# Patient Record
Sex: Male | Born: 1935 | Race: White | Hispanic: No | Smoking: Former smoker
Health system: Southern US, Community
[De-identification: ages and names within clinical notes are randomized; demographics above are authoritative.]

## PROBLEM LIST (undated history)

## (undated) DIAGNOSIS — E871 Hypo-osmolality and hyponatremia: Secondary | ICD-10-CM

## (undated) DIAGNOSIS — M48061 Spinal stenosis, lumbar region without neurogenic claudication: Secondary | ICD-10-CM

## (undated) DIAGNOSIS — I350 Nonrheumatic aortic (valve) stenosis: Secondary | ICD-10-CM

## (undated) DIAGNOSIS — I10 Essential (primary) hypertension: Secondary | ICD-10-CM

## (undated) DIAGNOSIS — I2510 Atherosclerotic heart disease of native coronary artery without angina pectoris: Secondary | ICD-10-CM

## (undated) DIAGNOSIS — M5136 Other intervertebral disc degeneration, lumbar region: Secondary | ICD-10-CM

## (undated) DIAGNOSIS — F0390 Unspecified dementia without behavioral disturbance: Secondary | ICD-10-CM

## (undated) DIAGNOSIS — C3490 Malignant neoplasm of unspecified part of unspecified bronchus or lung: Secondary | ICD-10-CM

## (undated) DIAGNOSIS — I2699 Other pulmonary embolism without acute cor pulmonale: Secondary | ICD-10-CM

## (undated) DIAGNOSIS — E7800 Pure hypercholesterolemia, unspecified: Secondary | ICD-10-CM

## (undated) DIAGNOSIS — G510 Bell's palsy: Secondary | ICD-10-CM

## (undated) DIAGNOSIS — N400 Enlarged prostate without lower urinary tract symptoms: Secondary | ICD-10-CM

## (undated) DIAGNOSIS — M51369 Other intervertebral disc degeneration, lumbar region without mention of lumbar back pain or lower extremity pain: Secondary | ICD-10-CM

## (undated) DIAGNOSIS — C801 Malignant (primary) neoplasm, unspecified: Secondary | ICD-10-CM

## (undated) DIAGNOSIS — K5790 Diverticulosis of intestine, part unspecified, without perforation or abscess without bleeding: Secondary | ICD-10-CM

## (undated) DIAGNOSIS — I639 Cerebral infarction, unspecified: Secondary | ICD-10-CM

## (undated) DIAGNOSIS — J449 Chronic obstructive pulmonary disease, unspecified: Secondary | ICD-10-CM

## (undated) DIAGNOSIS — K219 Gastro-esophageal reflux disease without esophagitis: Secondary | ICD-10-CM

## (undated) DIAGNOSIS — Z86718 Personal history of other venous thrombosis and embolism: Secondary | ICD-10-CM

## (undated) HISTORY — PX: LUNG REMOVAL, PARTIAL: SHX233

## (undated) HISTORY — DX: Pure hypercholesterolemia, unspecified: E78.00

## (undated) HISTORY — DX: Gastro-esophageal reflux disease without esophagitis: K21.9

## (undated) HISTORY — DX: Essential (primary) hypertension: I10

## (undated) HISTORY — DX: Other intervertebral disc degeneration, lumbar region: M51.36

## (undated) HISTORY — DX: Other pulmonary embolism without acute cor pulmonale: I26.99

## (undated) HISTORY — PX: FILTERING PROCEDURE: SHX5296

## (undated) HISTORY — DX: Chronic obstructive pulmonary disease, unspecified: J44.9

## (undated) HISTORY — DX: Hypo-osmolality and hyponatremia: E87.1

## (undated) HISTORY — DX: Diverticulosis of intestine, part unspecified, without perforation or abscess without bleeding: K57.90

## (undated) HISTORY — PX: KNEE SURGERY: SHX244

## (undated) HISTORY — DX: Atherosclerotic heart disease of native coronary artery without angina pectoris: I25.10

## (undated) HISTORY — PX: HERNIA REPAIR: SHX51

## (undated) HISTORY — DX: Spinal stenosis, lumbar region without neurogenic claudication: M48.061

## (undated) HISTORY — DX: Other intervertebral disc degeneration, lumbar region without mention of lumbar back pain or lower extremity pain: M51.369

## (undated) HISTORY — PX: CORONARY STENT PLACEMENT: SHX1402

---
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
## 1977-12-28 HISTORY — PX: LUMBAR LAMINECTOMY: SHX95

## 1989-12-28 HISTORY — PX: TURP VAPORIZATION: SUR1397

## 1999-11-04 ENCOUNTER — Encounter: Admission: RE | Admit: 1999-11-04 | Discharge: 2000-02-02 | Payer: Self-pay | Admitting: *Deleted

## 2004-03-13 ENCOUNTER — Ambulatory Visit (HOSPITAL_COMMUNITY): Admission: RE | Admit: 2004-03-13 | Discharge: 2004-03-13 | Payer: Self-pay | Admitting: Gastroenterology

## 2004-06-27 DIAGNOSIS — I2699 Other pulmonary embolism without acute cor pulmonale: Secondary | ICD-10-CM

## 2004-06-27 HISTORY — DX: Other pulmonary embolism without acute cor pulmonale: I26.99

## 2004-06-27 HISTORY — PX: VENA CAVA FILTER PLACEMENT: SUR1032

## 2004-07-04 ENCOUNTER — Inpatient Hospital Stay (HOSPITAL_COMMUNITY): Admission: RE | Admit: 2004-07-04 | Discharge: 2004-07-08 | Payer: Self-pay | Admitting: Specialist

## 2004-07-17 ENCOUNTER — Inpatient Hospital Stay (HOSPITAL_COMMUNITY): Admission: EM | Admit: 2004-07-17 | Discharge: 2004-07-23 | Payer: Self-pay | Admitting: Emergency Medicine

## 2004-08-28 DIAGNOSIS — C3490 Malignant neoplasm of unspecified part of unspecified bronchus or lung: Secondary | ICD-10-CM

## 2004-08-28 HISTORY — DX: Malignant neoplasm of unspecified part of unspecified bronchus or lung: C34.90

## 2004-09-05 ENCOUNTER — Encounter: Admission: RE | Admit: 2004-09-05 | Discharge: 2004-09-05 | Payer: Self-pay | Admitting: Geriatric Medicine

## 2004-09-11 ENCOUNTER — Ambulatory Visit (HOSPITAL_COMMUNITY): Admission: RE | Admit: 2004-09-11 | Discharge: 2004-09-11 | Payer: Self-pay | Admitting: Geriatric Medicine

## 2004-09-27 HISTORY — PX: WEDGE RESECTION: SHX5070

## 2004-10-22 ENCOUNTER — Ambulatory Visit: Payer: Self-pay | Admitting: Internal Medicine

## 2004-10-22 ENCOUNTER — Encounter (INDEPENDENT_AMBULATORY_CARE_PROVIDER_SITE_OTHER): Payer: Self-pay | Admitting: *Deleted

## 2004-10-22 ENCOUNTER — Inpatient Hospital Stay (HOSPITAL_COMMUNITY): Admission: RE | Admit: 2004-10-22 | Discharge: 2004-10-27 | Payer: Self-pay | Admitting: Thoracic Surgery

## 2004-11-05 ENCOUNTER — Encounter: Admission: RE | Admit: 2004-11-05 | Discharge: 2004-11-05 | Payer: Self-pay | Admitting: Thoracic Surgery

## 2004-11-19 ENCOUNTER — Encounter: Admission: RE | Admit: 2004-11-19 | Discharge: 2004-11-19 | Payer: Self-pay | Admitting: Thoracic Surgery

## 2004-12-23 ENCOUNTER — Ambulatory Visit: Payer: Self-pay | Admitting: Internal Medicine

## 2004-12-31 ENCOUNTER — Encounter: Admission: RE | Admit: 2004-12-31 | Discharge: 2004-12-31 | Payer: Self-pay | Admitting: Thoracic Surgery

## 2005-03-13 ENCOUNTER — Encounter: Admission: RE | Admit: 2005-03-13 | Discharge: 2005-03-13 | Payer: Self-pay | Admitting: Geriatric Medicine

## 2005-03-31 ENCOUNTER — Encounter: Admission: RE | Admit: 2005-03-31 | Discharge: 2005-03-31 | Payer: Self-pay | Admitting: Orthopedic Surgery

## 2005-04-01 ENCOUNTER — Encounter: Admission: RE | Admit: 2005-04-01 | Discharge: 2005-04-01 | Payer: Self-pay | Admitting: Thoracic Surgery

## 2005-04-14 ENCOUNTER — Encounter: Admission: RE | Admit: 2005-04-14 | Discharge: 2005-04-14 | Payer: Self-pay | Admitting: Geriatric Medicine

## 2005-04-24 ENCOUNTER — Ambulatory Visit: Payer: Self-pay | Admitting: Internal Medicine

## 2005-04-30 ENCOUNTER — Ambulatory Visit (HOSPITAL_COMMUNITY): Admission: RE | Admit: 2005-04-30 | Discharge: 2005-04-30 | Payer: Self-pay | Admitting: Internal Medicine

## 2005-08-05 ENCOUNTER — Encounter: Admission: RE | Admit: 2005-08-05 | Discharge: 2005-08-05 | Payer: Self-pay | Admitting: Thoracic Surgery

## 2005-09-25 ENCOUNTER — Encounter: Admission: RE | Admit: 2005-09-25 | Discharge: 2005-09-25 | Payer: Self-pay | Admitting: Geriatric Medicine

## 2005-10-16 ENCOUNTER — Ambulatory Visit: Payer: Self-pay | Admitting: Internal Medicine

## 2005-11-02 ENCOUNTER — Ambulatory Visit (HOSPITAL_COMMUNITY): Admission: RE | Admit: 2005-11-02 | Discharge: 2005-11-02 | Payer: Self-pay | Admitting: Internal Medicine

## 2006-05-10 ENCOUNTER — Ambulatory Visit: Payer: Self-pay | Admitting: Internal Medicine

## 2006-05-19 ENCOUNTER — Ambulatory Visit (HOSPITAL_COMMUNITY): Admission: RE | Admit: 2006-05-19 | Discharge: 2006-05-19 | Payer: Self-pay | Admitting: Internal Medicine

## 2006-11-08 ENCOUNTER — Ambulatory Visit: Payer: Self-pay | Admitting: Internal Medicine

## 2006-11-17 ENCOUNTER — Ambulatory Visit (HOSPITAL_COMMUNITY): Admission: RE | Admit: 2006-11-17 | Discharge: 2006-11-17 | Payer: Self-pay | Admitting: Internal Medicine

## 2006-11-17 LAB — COMPREHENSIVE METABOLIC PANEL
ALT: 18 U/L (ref 0–53)
AST: 23 U/L (ref 0–37)
CO2: 30 mEq/L (ref 19–32)
Calcium: 9.3 mg/dL (ref 8.4–10.5)
Chloride: 102 mEq/L (ref 96–112)
Creatinine, Ser: 0.81 mg/dL (ref 0.40–1.50)
Potassium: 4.4 mEq/L (ref 3.5–5.3)
Sodium: 140 mEq/L (ref 135–145)
Total Protein: 6.5 g/dL (ref 6.0–8.3)

## 2006-11-17 LAB — CBC WITH DIFFERENTIAL/PLATELET
BASO%: 0.8 % (ref 0.0–2.0)
EOS%: 1.5 % (ref 0.0–7.0)
HCT: 41.2 % (ref 38.7–49.9)
MCH: 31.5 pg (ref 28.0–33.4)
MCHC: 34.2 g/dL (ref 32.0–35.9)
MONO#: 0.5 10*3/uL (ref 0.1–0.9)
NEUT%: 62.7 % (ref 40.0–75.0)
RBC: 4.48 10*6/uL (ref 4.20–5.71)
RDW: 12.6 % (ref 11.2–14.6)
WBC: 6.3 10*3/uL (ref 4.0–10.0)
lymph#: 1.7 10*3/uL (ref 0.9–3.3)

## 2007-02-09 ENCOUNTER — Encounter: Admission: RE | Admit: 2007-02-09 | Discharge: 2007-02-09 | Payer: Self-pay | Admitting: Geriatric Medicine

## 2007-04-22 ENCOUNTER — Ambulatory Visit (HOSPITAL_COMMUNITY): Admission: RE | Admit: 2007-04-22 | Discharge: 2007-04-22 | Payer: Self-pay | Admitting: Geriatric Medicine

## 2007-05-16 ENCOUNTER — Ambulatory Visit: Payer: Self-pay | Admitting: Internal Medicine

## 2007-05-18 LAB — CBC WITH DIFFERENTIAL/PLATELET
BASO%: 0.2 % (ref 0.0–2.0)
EOS%: 1.3 % (ref 0.0–7.0)
HCT: 41.9 % (ref 38.7–49.9)
LYMPH%: 19 % (ref 14.0–48.0)
MCH: 31.7 pg (ref 28.0–33.4)
MCHC: 34.8 g/dL (ref 32.0–35.9)
NEUT%: 71.9 % (ref 40.0–75.0)
RBC: 4.61 10*6/uL (ref 4.20–5.71)
WBC: 7.1 10*3/uL (ref 4.0–10.0)
lymph#: 1.3 10*3/uL (ref 0.9–3.3)

## 2007-05-18 LAB — COMPREHENSIVE METABOLIC PANEL
ALT: 18 U/L (ref 0–53)
AST: 20 U/L (ref 0–37)
Chloride: 104 mEq/L (ref 96–112)
Creatinine, Ser: 0.84 mg/dL (ref 0.40–1.50)
Sodium: 140 mEq/L (ref 135–145)
Total Bilirubin: 0.7 mg/dL (ref 0.3–1.2)
Total Protein: 6.7 g/dL (ref 6.0–8.3)

## 2007-05-20 ENCOUNTER — Ambulatory Visit (HOSPITAL_COMMUNITY): Admission: RE | Admit: 2007-05-20 | Discharge: 2007-05-20 | Payer: Self-pay | Admitting: Internal Medicine

## 2007-11-07 ENCOUNTER — Ambulatory Visit: Payer: Self-pay | Admitting: Internal Medicine

## 2007-11-09 LAB — COMPREHENSIVE METABOLIC PANEL
Albumin: 4.4 g/dL (ref 3.5–5.2)
Alkaline Phosphatase: 133 U/L — ABNORMAL HIGH (ref 39–117)
BUN: 15 mg/dL (ref 6–23)
CO2: 26 mEq/L (ref 19–32)
Glucose, Bld: 184 mg/dL — ABNORMAL HIGH (ref 70–99)
Potassium: 5.1 mEq/L (ref 3.5–5.3)
Sodium: 139 mEq/L (ref 135–145)
Total Bilirubin: 0.9 mg/dL (ref 0.3–1.2)
Total Protein: 7.1 g/dL (ref 6.0–8.3)

## 2007-11-09 LAB — CBC WITH DIFFERENTIAL/PLATELET
Basophils Absolute: 0.1 10*3/uL (ref 0.0–0.1)
Eosinophils Absolute: 0.1 10*3/uL (ref 0.0–0.5)
HGB: 15.4 g/dL (ref 13.0–17.1)
LYMPH%: 25.6 % (ref 14.0–48.0)
MCV: 91.5 fL (ref 81.6–98.0)
MONO#: 0.5 10*3/uL (ref 0.1–0.9)
MONO%: 7.9 % (ref 0.0–13.0)
NEUT#: 4 10*3/uL (ref 1.5–6.5)
Platelets: 279 10*3/uL (ref 145–400)
RBC: 4.78 10*6/uL (ref 4.20–5.71)
WBC: 6.4 10*3/uL (ref 4.0–10.0)

## 2007-11-11 ENCOUNTER — Ambulatory Visit (HOSPITAL_COMMUNITY): Admission: RE | Admit: 2007-11-11 | Discharge: 2007-11-11 | Payer: Self-pay | Admitting: Internal Medicine

## 2008-05-10 ENCOUNTER — Ambulatory Visit (HOSPITAL_COMMUNITY): Admission: RE | Admit: 2008-05-10 | Discharge: 2008-05-10 | Payer: Self-pay | Admitting: Geriatric Medicine

## 2008-12-19 ENCOUNTER — Ambulatory Visit: Payer: Self-pay | Admitting: Internal Medicine

## 2008-12-25 ENCOUNTER — Ambulatory Visit (HOSPITAL_COMMUNITY): Admission: RE | Admit: 2008-12-25 | Discharge: 2008-12-25 | Payer: Self-pay | Admitting: Internal Medicine

## 2008-12-25 LAB — CBC WITH DIFFERENTIAL/PLATELET
BASO%: 0.4 % (ref 0.0–2.0)
HCT: 42.7 % (ref 38.7–49.9)
LYMPH%: 22.7 % (ref 14.0–48.0)
MCH: 31.9 pg (ref 28.0–33.4)
MCHC: 34.7 g/dL (ref 32.0–35.9)
MONO%: 7.4 % (ref 0.0–13.0)
NEUT#: 4.2 10*3/uL (ref 1.5–6.5)
RBC: 4.64 10*6/uL (ref 4.20–5.71)
WBC: 6.2 10*3/uL (ref 4.0–10.0)
lymph#: 1.4 10*3/uL (ref 0.9–3.3)

## 2008-12-25 LAB — COMPREHENSIVE METABOLIC PANEL
ALT: 21 U/L (ref 0–53)
AST: 25 U/L (ref 0–37)
Albumin: 3.8 g/dL (ref 3.5–5.2)
CO2: 31 mEq/L (ref 19–32)
Calcium: 9 mg/dL (ref 8.4–10.5)
Chloride: 99 mEq/L (ref 96–112)
Creatinine, Ser: 0.8 mg/dL (ref 0.40–1.50)
Potassium: 4.1 mEq/L (ref 3.5–5.3)

## 2009-02-04 ENCOUNTER — Ambulatory Visit: Payer: Self-pay | Admitting: Pulmonary Disease

## 2009-02-04 DIAGNOSIS — R0602 Shortness of breath: Secondary | ICD-10-CM | POA: Insufficient documentation

## 2009-02-04 DIAGNOSIS — J449 Chronic obstructive pulmonary disease, unspecified: Secondary | ICD-10-CM | POA: Insufficient documentation

## 2009-02-04 DIAGNOSIS — I2699 Other pulmonary embolism without acute cor pulmonale: Secondary | ICD-10-CM

## 2009-02-04 DIAGNOSIS — E119 Type 2 diabetes mellitus without complications: Secondary | ICD-10-CM | POA: Insufficient documentation

## 2009-02-04 DIAGNOSIS — C3490 Malignant neoplasm of unspecified part of unspecified bronchus or lung: Secondary | ICD-10-CM | POA: Insufficient documentation

## 2009-02-05 DIAGNOSIS — I2510 Atherosclerotic heart disease of native coronary artery without angina pectoris: Secondary | ICD-10-CM | POA: Insufficient documentation

## 2009-02-08 ENCOUNTER — Ambulatory Visit: Payer: Self-pay | Admitting: Pulmonary Disease

## 2009-02-22 ENCOUNTER — Encounter: Payer: Self-pay | Admitting: Pulmonary Disease

## 2009-02-22 ENCOUNTER — Ambulatory Visit: Payer: Self-pay | Admitting: Pulmonary Disease

## 2009-02-23 DIAGNOSIS — J45909 Unspecified asthma, uncomplicated: Secondary | ICD-10-CM | POA: Insufficient documentation

## 2009-12-23 ENCOUNTER — Ambulatory Visit: Payer: Self-pay | Admitting: Internal Medicine

## 2009-12-30 ENCOUNTER — Ambulatory Visit (HOSPITAL_COMMUNITY)
Admission: RE | Admit: 2009-12-30 | Discharge: 2009-12-30 | Payer: Self-pay | Source: Home / Self Care | Admitting: Internal Medicine

## 2009-12-30 LAB — COMPREHENSIVE METABOLIC PANEL
AST: 22 U/L (ref 0–37)
Albumin: 3.8 g/dL (ref 3.5–5.2)
Calcium: 8.9 mg/dL (ref 8.4–10.5)
Potassium: 4.3 mEq/L (ref 3.5–5.3)
Sodium: 138 mEq/L (ref 135–145)
Total Protein: 6.9 g/dL (ref 6.0–8.3)

## 2009-12-30 LAB — CBC WITH DIFFERENTIAL/PLATELET
BASO%: 0.2 % (ref 0.0–2.0)
Basophils Absolute: 0 10*3/uL (ref 0.0–0.1)
EOS%: 2.6 % (ref 0.0–7.0)
HCT: 43 % (ref 38.4–49.9)
LYMPH%: 23.4 % (ref 14.0–49.0)
MCH: 32.8 pg (ref 27.2–33.4)
MCV: 94.9 fL (ref 79.3–98.0)
NEUT#: 4.3 10*3/uL (ref 1.5–6.5)
Platelets: 278 10*3/uL (ref 140–400)
RDW: 12.8 % (ref 11.0–14.6)

## 2010-12-26 ENCOUNTER — Ambulatory Visit: Payer: Self-pay | Admitting: Internal Medicine

## 2010-12-30 ENCOUNTER — Ambulatory Visit (HOSPITAL_COMMUNITY)
Admission: RE | Admit: 2010-12-30 | Discharge: 2010-12-30 | Payer: Self-pay | Source: Home / Self Care | Attending: Internal Medicine | Admitting: Internal Medicine

## 2010-12-30 LAB — COMPREHENSIVE METABOLIC PANEL
ALT: 17 U/L (ref 0–53)
AST: 22 U/L (ref 0–37)
Albumin: 4.1 g/dL (ref 3.5–5.2)
Alkaline Phosphatase: 143 U/L — ABNORMAL HIGH (ref 39–117)
BUN: 13 mg/dL (ref 6–23)
CO2: 28 mEq/L (ref 19–32)
Calcium: 9.4 mg/dL (ref 8.4–10.5)
Chloride: 98 mEq/L (ref 96–112)
Creatinine, Ser: 0.82 mg/dL (ref 0.40–1.50)
Glucose, Bld: 217 mg/dL — ABNORMAL HIGH (ref 70–99)
Potassium: 4.2 mEq/L (ref 3.5–5.3)
Sodium: 137 mEq/L (ref 135–145)
Total Bilirubin: 0.7 mg/dL (ref 0.3–1.2)
Total Protein: 6.9 g/dL (ref 6.0–8.3)

## 2010-12-30 LAB — CBC WITH DIFFERENTIAL/PLATELET
BASO%: 0.4 % (ref 0.0–2.0)
Basophils Absolute: 0 10*3/uL (ref 0.0–0.1)
HCT: 43.7 % (ref 38.4–49.9)
MCV: 93.7 fL (ref 79.3–98.0)
MONO#: 0.6 10*3/uL (ref 0.1–0.9)
MONO%: 8 % (ref 0.0–14.0)
NEUT%: 61.6 % (ref 39.0–75.0)
RBC: 4.67 10*6/uL (ref 4.20–5.82)

## 2011-01-17 ENCOUNTER — Encounter: Payer: Self-pay | Admitting: Thoracic Surgery

## 2011-01-17 ENCOUNTER — Encounter: Payer: Self-pay | Admitting: Internal Medicine

## 2011-01-18 ENCOUNTER — Encounter: Payer: Self-pay | Admitting: Internal Medicine

## 2011-01-18 ENCOUNTER — Encounter: Payer: Self-pay | Admitting: Thoracic Surgery

## 2011-01-18 ENCOUNTER — Encounter: Payer: Self-pay | Admitting: Geriatric Medicine

## 2011-01-19 ENCOUNTER — Encounter: Payer: Self-pay | Admitting: Internal Medicine

## 2011-02-12 ENCOUNTER — Other Ambulatory Visit: Payer: Self-pay | Admitting: Geriatric Medicine

## 2011-02-12 DIAGNOSIS — M545 Low back pain: Secondary | ICD-10-CM

## 2011-02-16 ENCOUNTER — Ambulatory Visit
Admission: RE | Admit: 2011-02-16 | Discharge: 2011-02-16 | Disposition: A | Discharge status: Home / Self Care | Payer: Medicare Other | Source: Ambulatory Visit | Attending: Geriatric Medicine | Admitting: Geriatric Medicine

## 2011-02-16 DIAGNOSIS — M545 Low back pain: Secondary | ICD-10-CM

## 2011-02-16 MED ORDER — GADOBENATE DIMEGLUMINE 529 MG/ML IV SOLN
20.0000 mL | Freq: Once | INTRAVENOUS | Status: AC | PRN
  Administered 2011-02-16: 20 mL via INTRAVENOUS

## 2011-02-26 ENCOUNTER — Ambulatory Visit: Payer: Medicare Other | Attending: Geriatric Medicine

## 2011-02-26 DIAGNOSIS — M545 Low back pain, unspecified: Secondary | ICD-10-CM | POA: Insufficient documentation

## 2011-02-26 DIAGNOSIS — M2560 Stiffness of unspecified joint, not elsewhere classified: Secondary | ICD-10-CM | POA: Insufficient documentation

## 2011-02-26 DIAGNOSIS — R262 Difficulty in walking, not elsewhere classified: Secondary | ICD-10-CM | POA: Insufficient documentation

## 2011-02-26 DIAGNOSIS — IMO0001 Reserved for inherently not codable concepts without codable children: Secondary | ICD-10-CM | POA: Insufficient documentation

## 2011-03-03 ENCOUNTER — Encounter: Payer: Medicare Other | Admitting: Physical Therapy

## 2011-03-05 ENCOUNTER — Encounter: Payer: Medicare Other | Admitting: Rehabilitation

## 2011-03-10 ENCOUNTER — Encounter: Payer: Medicare Other | Admitting: Rehabilitation

## 2011-03-10 ENCOUNTER — Ambulatory Visit: Payer: Medicare Other

## 2011-03-12 ENCOUNTER — Ambulatory Visit: Payer: Medicare Other

## 2011-03-17 ENCOUNTER — Ambulatory Visit: Payer: Medicare Other

## 2011-03-19 ENCOUNTER — Ambulatory Visit: Payer: Medicare Other

## 2011-04-23 ENCOUNTER — Other Ambulatory Visit (HOSPITAL_COMMUNITY): Payer: Self-pay | Admitting: Urology

## 2011-04-23 DIAGNOSIS — N4340 Spermatocele of epididymis, unspecified: Secondary | ICD-10-CM

## 2011-04-24 ENCOUNTER — Ambulatory Visit (HOSPITAL_COMMUNITY)
Admission: RE | Admit: 2011-04-24 | Discharge: 2011-04-24 | Disposition: A | Discharge status: Home / Self Care | Payer: Medicare Other | Source: Ambulatory Visit | Attending: Urology | Admitting: Urology

## 2011-04-24 DIAGNOSIS — N4340 Spermatocele of epididymis, unspecified: Secondary | ICD-10-CM

## 2011-04-24 DIAGNOSIS — N508 Other specified disorders of male genital organs: Secondary | ICD-10-CM | POA: Insufficient documentation

## 2011-05-15 NOTE — Op Note (Signed)
NAME:  Chad, Fuller NO.:  [id number]   MEDICAL RECORD NO.:  [id number]                   PATIENT TYPE:  AMB   LOCATION:  ENDO                                 FACILITY:  [facility]   PHYSICIAN:  Danise Edge, M.D.                DATE OF BIRTH:  1926/01/28   DATE OF PROCEDURE:  03/13/2004  DATE OF DISCHARGE:                                 OPERATIVE REPORT   REFERRING PHYSICIAN:  Dr. Merlene Laughter.   PROCEDURE:  Screening colonoscopy.   PROCEDURE INDICATION:  Mr. [name] is a 77 year old male, born  1926-04-14.  Mr. [name] is scheduled to undergo his first screening  colonoscopy with polypectomy to prevent colon cancer.   ENDOSCOPIST:  Charolett Bumpers, M.D.   PREMEDICATION:  1. Versed 5 mg.  2. Demerol 50 mg.   DESCRIPTION OF PROCEDURE:  After obtaining informed consent, Mr. [name] was  placed in the left lateral decubitus position.  I administered intravenous  Demerol and intravenous Versed to achieve conscious sedation for the  procedure.  The patient's blood pressure, oxygen saturation, and cardiac  rhythm were monitored throughout the procedure and documented in the medical  record.   Anal inspection was normal.  Digital rectal exam revealed a nonnodular  prostate.  The Olympus adjustable pediatric colonoscope was introduced into  the rectum and advanced to the cecum.  Mr. [name] has universal colonic  diverticulosis.  Colonic preparation for the exam today was satisfactory.   RECTUM:  Normal.  SIGMOID COLON AND DESCENDING COLON:  Normal.  SPLENIC FLEXURE:  Normal.  TRANSVERSE COLON:  Normal.  HEPATIC FLEXURE:  Normal.  ASCENDING COLON:  Normal.  CECUM AND ILEOCECAL VALVE:  Normal.   ASSESSMENT:  1. Normal screening proctocolonoscopy to the cecum.  No endoscopic evidence     for the presence of colorectal neoplasia.  2. Universal colonic diverticulosis without diverticulitis or diverticular     stricture formation.                                               Danise Edge, M.D.    MJ/MEDQ  D:  03/13/2004  T:  03/13/2004  Job:  [id number]   cc:   Hal T. Stoneking, M.D.  [address] [place], [place] [address]  Fax: [phone]

## 2011-05-15 NOTE — Discharge Summary (Signed)
NAME:  Chad Fuller, Chad Fuller NO.:  [id number]   MEDICAL RECORD NO.:  [id number]                   PATIENT TYPE:  INP   LOCATION:  0482                                 FACILITY:  [facility]   PHYSICIAN:  Erasmo Leventhal, M.D.         DATE OF BIRTH:  [birth year]   DATE OF ADMISSION:  07/04/2004  DATE OF DISCHARGE:  07/08/2004                                 DISCHARGE SUMMARY   ADMISSION DIAGNOSIS:  End-stage osteoarthritis, left knee.   DISCHARGE DIAGNOSIS:  End-stage osteoarthritis, left knee.   OPERATION:  Total knee arthroplasty, left knee.   BRIEF HISTORY:  This is a 77 year old gentleman with end-stage  osteoarthritis of the left knee, who has failed conservative management.  After discussion of treatment options, risks, benefits and after-care,  patient is now scheduled for total knee arthroplasty.  Medical consult was  obtained preoperatively.  Medical clearance obtained.   LABORATORY VALUES:  Admission CBC within normal limits.  Hemoglobin and  hematocrit reached a low of 10.5 and 30.1.  PT/PTT within normal limits on  admission.  By discharge, his PT was 15.7 with an INR of 1.4.  Admission  CMET within normal limits with the exception of an elevated glucose at 182.  Glycosylate hemoglobin is 7.3.  Urinalysis within normal limits.   HOSPITAL COURSE:  Patient tolerated the operative procedure well.  Medical  consult was obtained for management of hyperglycemia and medical management  in the perioperative period.  The first postoperative day, vital signs were  stable.  He was afebrile.  Hemoglobin and hematocrit were stable.  Glucose  was 259 and was to be managed by the medical service.  Otherwise, dressing  was dry.  Drain was removed without difficulty.  Patient was started in  physical therapy.   On the second postoperative day, the patient had moderate pain in his knee.  He did not like the CPM machine.  Vital signs are stable.  He is  afebrile.  O2 sats are 99 on 2 liters.  Pulse 96.  Sodium 128, glucose 202.  Lungs were  clear.  Heart sounds were normal.  Bowel sounds were active.  Calves were  negative.  Dressing was changed.  Wound was benign.  Neurovascular status  was grossly intact in the leg.  Medical service was to address the  hyperglycemia and hyponatremia.  His Foley was DC'd, and physical therapy  was initiated.   On the third postoperative day, vital signs were stable.  He was afebrile.  He was mildly tachycardic at 111, 95% O2 sats on room air.  Lungs are clear.  Heart sounds are normal.  Heart rate is 96 at my visit with respirations of  20.  Calves were negative.  No cords.  Negative Homans' sign.  Dressing was  changed.  The wound was benign.  Discharge planning was made for Wednesday.   On the fourth postoperative day, vital signs were stable.  Pulse was back  down to 102.  He was afebrile.  His lungs were clear.  Heart sounds normal.  Bowel sounds active.  Calves were negative.  The dressing was changed.  The  wound benign.  He had done very well in physical therapy, and plans were  made for discharge that day, pending clearance from the medical service.   CONDITION ON DISCHARGE:  Improved.   DISCHARGE MEDICATIONS:  1. Percocet 1-2 q.6h. p.r.n. pain.  2. Robaxin 500 1 p.o. q.8h. p.r.n. spasm.  3. Coumadin per pharmacy protocol.  4. Trinsicon 1 b.i.d.  5. He is also discharged home on Lantus insulin and oral medications per the     medical service.   Follow up in the office in 10 days.  Follow up with the medical service with  Dr. Pete Glatter on Thursday or Friday.     Chad Fuller. Chad Fuller, P.A.                   Erasmo Leventhal, M.D.    SJC/MEDQ  D:  08/05/2004  T:  08/05/2004  Job:  [id number]

## 2011-05-15 NOTE — Op Note (Signed)
Chad Fuller, EADS NO.:  [id number]   MEDICAL RECORD NO.:  [id number]          PATIENT TYPE:  INP   LOCATION:  2899                         FACILITY:  [facility]   PHYSICIAN:  Ines Bloomer, M.D. DATE OF BIRTH:  [birth year]   DATE OF PROCEDURE:  10/22/2004  DATE OF DISCHARGE:                                 OPERATIVE REPORT   PREOPERATIVE DIAGNOSIS:  Possible nonsmall cell cancer, right upper lobe.   POSTOPERATIVE DIAGNOSIS:  Nonsmall cell cancer, right upper lobe.   OPERATION PERFORMED:  Right video assisted thoracoscopic surgery, mini  thoracotomy, wedge right upper lobe lesion with node sampling.   SURGEON:  Ines Bloomer, M.D.   ASSISTANT:  Coral Ceo, P.A.   ANESTHESIA:  General.   INDICATIONS FOR PROCEDURE:  This 77 year old patient had multiple medical  problems including recent deep venous thrombosis and had a filter placed and  was found to have a right upper lobe lesion that was positive on PET scan.  Pulmonary function tests were adequate for possible lobectomy but because of  his medical problem it was decided to do a wedge and node sampling of this  lesion.   DESCRIPTION OF PROCEDURE:  After general anesthesia, a two-lumen tube was  inserted.  He was turned to the right lateral thoracotomy position.  Two  trocar sites were made in the anterior and posterior axillary line.  At the  seventh interspace two trocars were inserted.  A 30 degree scope was  inserted, unfortunately, there was much adhesions as the anterior segment  and the middle of the right upper lobe and the right middle lobe to the  chest wall and these had to be taken down with Landrenreau scissors as well  as hook scissors.  After this had been done, a small posterior lateral  thoracotomy was made over the fifth intercostal space with the latissimus  being partially divided and the serratus reflected anteriorly.  Two  Tuffiers.  A Tuffier was placed in the interspace but  the portion of the  sixth rib was taken subperiosteally at the angle in order to open this space  up.  The lesion was palpated in the posterior segment of the right upper  lobe but is right near the minor fissure.  First thing we did divide the  adhesions to the major fissure and partially divided the major fissure with  the EZ-45 stapler.  Then we divided right along the minor fissure with the  AutoSuture 60 stapler with Singard reinforcement and then divided across the  base with the same AutoSuture 60 stapler and Singard reinforcements.  One 45  stapler was used.  The lesion was sent for frozen section and revealed a  nonsmall cell lung cancer and margins were negative.  A posterior  mediastinum was dissected and an 11R and 10R node were dissected free from  the posterior mediastinum.  These were very small.  Then dissecting  anteriorly, one small 4R node was found.  No other nodes could be seen.  Two  chest tubes were brought into the trocar sites and tied in place  with 0  silk.  Marcaine block was done in the usual fashion.  Chest was closed  with two pericostals. The On-Q catheters were placed in the wound.  The  wound was closed with #1 Vicryl in the muscle layer, 2-0 Vicryl in the  subcutaneous tissue and 3-0 Vicryl as a subcuticular stitch.  The patient  was then transferred to the recovery room in stable condition.       DPB/MEDQ  D:  10/22/2004  T:  10/22/2004  Job:  [id number]

## 2011-05-15 NOTE — H&P (Signed)
NAME:  Chad Fuller, Chad Fuller NO.:  [id number]   MEDICAL RECORD NO.:  [id number]                   PATIENT TYPE:  EMS   LOCATION:  ED                                   FACILITY:  [facility]   PHYSICIAN:  Theressa Millard, M.D.                 DATE OF BIRTH:  05/04/1926   DATE OF ADMISSION:  07/17/2004  DATE OF DISCHARGE:                                HISTORY & PHYSICAL   Chad Fuller is a 77 year old white male admitted with a pulmonary embolism.  He had a total knee replacement on July 04, 2004 by Dr. Valma Cava.  He  was placed on postop Coumadin and has been monitored appropriately.  He was  doing okay until today when he developed shortness of breath that would not  resolve.  He came to the emergency department, where evaluation revealed a  pulmonary embolism.  He has also noted some dyspnea with his oxycodone in  the last week or so.  He states that physical therapy was rather vigorous  yesterday.   PAST MEDICAL HISTORY:  1. Diabetes mellitus.  2. Coronary artery disease with PTCA of the LAD in 1995.  3. BPH.  4. Hypercholesterolemia.  5. Degenerative joint disease.  6. Reactive airway disease.   PAST SURGICAL HISTORY:  1. Total knee replacement.  2. Lumbar laminectomy.  3. Bilateral inguinal hernia repairs.   MEDICATIONS:  1. Glucophage XR 500 mg b.i.d. with 2 at bedtime.  2. Proscar 5 mg daily.  3. Glucotrol 10 mg daily.  4. Zocor 200 mg daily.  5. Lantus insulin 22 units q.h.s.  6. Glyset 50 mg t.i.d. with meals.   No known drug allergies.   SOCIAL HISTORY:  He is married.  He has six children.  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  A 50-pack-  year smoking history, stopped in 1975.  No alcohol.   FAMILY HISTORY:  Father had cancer of the throat.  Mother had an MI.  One  brother died of complications of alcoholism, diabetes, and cirrhosis.  One  sister died of breast cancer and also CAD.  One  sister had CAD.  Another  sister in fairly good health.   REVIEW OF SYSTEMS:  Constipation secondary to pain medications.  All other  systems are negative.   PHYSICAL EXAMINATION:  VITAL SIGNS:  Blood pressure 130/70, pulse 112.  HEENT:  The eyes have pupils which are round and reactive to light.  Extraocular movements are intact.  Ears are normal.  Nose and throat are  unremarkable.  NECK:  Supple.  Thyroid is not enlarged or tender.  LUNGS:  Clear to auscultation and percussion.  HEART:  Normal S1 and S2 without an S3, S4, murmur, rub, or click.  ABDOMEN:  Soft and nontender with normal bowel sounds.  EXTREMITIES:  Well-healing scar on the knee.  No edema.   LABORATORY:  Sodium 124, potassium 4.6, BUN 13, creatinine 0.7, glucose 193.  INR 2.6.   Chest CT reveals a 10 mm left upper lobe nodule, worrisome for cancer.  Right lower lobe superior segment pulmonary embolism.   IMPRESSION:  1. Pulmonary embolism, status post total knee replacement.  2. Adequate anticoagulation.  3. Constipation secondary to pain medications.  4. Hyponatremia.  5. Pulmonary nodule on CT scan.  6. Diabetes mellitus.  7. Coronary artery disease.  8. Benign prostatic hypertrophy.  9. Reactive airway disease.   PLAN:  Patient will be admitted.  Will place the patient on IV heparin per  pharmacy protocol.  N.P.O. after midnight.  He will be seen tomorrow by  vascular surgery, who will place a [place] filter.  Follow CBGs.  Hold  Glucophage because of contrast material.  Consult ortho in the morning for  further physical therapy of need.                                               Theressa Millard, M.D.    JO/MEDQ  D:  07/17/2004  T:  07/17/2004  Job:  [id number]

## 2011-05-15 NOTE — Consult Note (Signed)
NAME:  Chad Fuller, Chad Fuller NO.:  [id number]   MEDICAL RECORD NO.:  [id number]          PATIENT TYPE:  INP   LOCATION:  3306                         FACILITY:  [facility]   PHYSICIAN:  Lajuana Matte, MD  DATE OF BIRTH:  [birth year]   DATE OF CONSULTATION:  10/24/2004  DATE OF DISCHARGE:                                   CONSULTATION   REFERRING PHYSICIAN:  Dr. Karle Plumber   REASON FOR CONSULTATION:  A 77 year old white male diagnosed with nonsmall  cell lung cancer.   HISTORY:  Chad Fuller is a 77 year old white male who had left knee  replacement in July of 2005 complicated by postobstructive pulmonary  embolism and had IVC filter placed and was started on Coumadin.  During his  evaluation he was found to have a suspicious lesion in the right upper lobe.  This was further evaluated by PET scan which was performed on September 11, 2004 and showed increased metabolic activity within the nodule in the  inferior right upper lobe with the maximum SUV of 4.4.  No other  abnormalities were seen in the chest, abdomen, or pelvis.  On October 22, 2004 the patient underwent right VATS, mini thoracotomy, and wedge resection  of the right upper lobe lesion with lymph node dissection under the care of  Dr. Edwyna Shell.  The pathology revealed a 1.5 cm tumor consistent with squamous  cell carcinoma, moderately differentiated with negative metastasis to the  dissected lymph nodes with a pathologic stage (pT1 pN0 pMX) and the patient  was referred to me today for further evaluation and discussion about future  treatment.  When seen today he is recovering well from his surgery.  Other  than the mild soreness and chest discomfort at the right side secondary to  the surgical incision, the patient denies having any significant complaints.   review of systems today he had no fever, chills, headache, blurring of  vision, double vision.  No chest pain, shortness of breath, cough, or  palpitations.  No nausea, vomiting, abdominal pain, diarrhea, constipation,  melena, or hematochezia.  No dysuria, hematuria, urgency, or increased  frequency.   PAST MEDICAL HISTORY:  1.  Diabetes mellitus.  2.  Coronary artery disease status post PTCA of the LAD in 1995.  3.  He also has a history of hypercholesterolemia.  4.  History of pulmonary embolism in July 2005, status post IVC filter      placement.  5.  Status post left total knee replacement.  6.  Status post bilateral inguinal hernia repair.  7.  Status post lumbar laminectomy in 1975.  8.  History of benign prostatic hypertrophy.  9.  Degenerative joint disease.   FAMILY HISTORY:  Significant for diabetes and cirrhosis.  He has a brother  who died from throat cancer.   SOCIAL HISTORY:  Married.  Has a history of smoking 50-pack-year.  Quit in  1975.  No history of alcohol or drug abuse.   ALLERGIES:  No known drug allergies.   HOME MEDICATIONS:  1.  Albuterol inhaler.  2.  Lipitor.  3.  Proscar.  4.  Glipizide.  5.  Glucophage.  6.  Lantus.  7.  Coumadin.  8.  Multivitamin.  9.  Glucosamine.   PHYSICAL EXAMINATION:  GENERAL:  A 77 year old white male awake, alert, in  no acute distress.  HEENT:  Normocephalic, atraumatic.  Clear oropharynx.  NECK:  Supple.  No JVD.  No thyromegaly or lymphadenopathy.  CHEST:  Clear to auscultation.  Chest tube on the right side of the chest in  place with no evidence of infection.  CARDIOVASCULAR:  Normal S1, S2.  Normal rhythm and rate.  No murmurs, rubs,  or gallops.  ABDOMEN:  Soft, nontender, nondistended.  No masses or hepatosplenomegaly.  EXTREMITIES:  No edema.  NEUROLOGIC:  No focal sensory or motor deficit.   LABORATORY DATA:  CBC today showed white blood count 10.4, hemoglobin 12.6,  hematocrit 36.3, platelets 308.  BUN 11, creatinine 0.7, total bilirubin  0.8, calcium 8.4.   ASSESSMENT/PLAN:  This is a 77 year old white male diagnosed in October of  2005  with a stage IA (T1 N0 MX) nonsmall cell lung cancer, moderately  differentiated squamous cell carcinoma status post right upper lobe wedge  resection and lymph node dissection.   RECOMMENDATIONS:  No proven benefit for adjuvant chemotherapy in stage IA  nonsmall cell lung cancer but the patient will require close monitoring with  repeat CT scan every three months at least for the first two years because  he did not have total lobectomy for his cancer.  Also, the patient may be  considered for chemoprevention clinical trial with selenium versus placebo  and the patient will be eligible for the trial six months after surgery if  there is no evidence of recurrence.  I will scheduled the patient for a  follow-up visit with me in three months at the [facility] and at  that time I will repeat CT scan of the chest for further evaluation.  Thank  you so much for allowing me to participate in the care of Chad Fuller.      Moha   MKM/MEDQ  D:  10/24/2004  T:  10/24/2004  Job:  [id number]   cc:   Hal T. Stoneking, M.D.  [address] [place], [place] [address]  Fax: [phone]

## 2011-05-15 NOTE — Op Note (Signed)
NAME:  Chad Fuller, Chad Fuller NO.:  [id number]   MEDICAL RECORD NO.:  [id number]                   PATIENT TYPE:  INP   LOCATION:  0482                                 FACILITY:  [facility]   PHYSICIAN:  Erasmo Leventhal, M.D.         DATE OF BIRTH:  04/04/1926   DATE OF PROCEDURE:  07/04/2004  DATE OF DISCHARGE:                                 OPERATIVE REPORT   PREOPERATIVE DIAGNOSIS:  Left knee end-stage osteoarthritis.   POSTOPERATIVE DIAGNOSIS:  Left knee end-stage osteoarthritis.   PROCEDURE:  Left total knee arthroplasty.   SURGEON:  Valma Cava, M.D.   ASSISTANT:  Jodene Nam, PA-C.   ANESTHESIA:  Combined spinal epidural.   ESTIMATED BLOOD LOSS:  Less than 50 mL.   DRAINS:  Two mini Hemovacs.   TOURNIQUET TIME:  1 hour and 30 minutes at 350 mmHg.   COMPLICATIONS:  None.   DISPOSITION:  To PACU stable.   OPERATIVE IMPLANTS:  Osteonics components.  Posterior stabilized.  All  cemented.  Size 11 femur, size 11 tibia, 10 mm flex insert with a 28 mm  patella.   OPERATIVE DETAILS:  The patient counseled in the holding area; correct site  was identified.  Chart was reviewed and signed appropriately.  Taken to the  OR where a spinal epidural was administered.  IV antibiotics were given, 1 g  of Ancef preoperatively.  Foley catheter was placed utilizing sterile  technique by the OR circulating nurse.  All extremities were well-padded,  and left knee was examined.  He lacked 5 degrees of full extension.  He  could flex to 120 degrees.  Elevated, prepped with DuraPrep, and all draped  in a sterile fashion.  Exsanguinated with Esmarch, tourniquet was inflated  to 350 mmHg.  A straight midline incision made through skin and subcutaneous  tissues, small veins electrocoagulated.  Medial and lateral soft tissue  flaps were developed, and a medial parapatellar arthrotomy was performed,  and a proximal medial tibia soft tissue release was done.   The patella was  everted; knee was then flexed, end-stage arthritic change.  The cruciate  ligaments were resected.  Starting hole was made in the distal femur; the  canal was irrigated.  The effluent was clear.  The intramedullary awl was  gently placed.  I chose to take a 10 mm cut off the distal femur, and this  was done.  The distal femur was found to be a size 11.  Rotational marks  were made, and the distal femur was cut to fit a size 11.  Medial and  lateral menisci were removed under direct visualization.  Geniculate vessels  were coagulated.  Posterior neurovascular structures were __________ and  protected throughout the entire case.  Tibial eminence was resected.  The  proximal tibia was found to be a size 11.  Starter hole was made.  Canal was  irrigated until effluent was clear; intramedullary  rod was gently placed.  Next, we took a 10 mm cut based upon the lateral side.  Posteromedial and  posterolateral femoral osteophytes were removed under direct visualization.  Femoral trochlea was prepared.  At this time with trials in place, we found  we were tight in flexion and extension.  The trials were removed.  Another 2  mm taken off the tibia.  At this time, the size 11 femur, size 11 tibia with  a 10 mm flex insert with excellent range of motion, soft tissue balance in  flexion and extension.  Rotational marks were made, and the delta keel was  performed in standard fashion.  Patella was found to be a size 28, reamed to  the appropriate depth.  Locking holes were made.  The knee was then  copiously irrigated with pulsatile lavage utilizing modern and cement  technique.  All components were cemented into place.  Size 11 femur, size 11  tibia, with a 28 mm patella.  After the cement had cured with a 10 mm trial  with excellent range of motion and soft tissue balance, the tibial trial was  removed.  Excess cement was removed.  Bone wax was placed in exposed bony  surfaces.   Everything was checked and found to be in excellent condition,  and a 10 mm flex posterior stabilized tibial insert was placed.  Knee was  then again checked, well balanced in flexion and extension.  Patellofemoral  tracking was anatomic, did not require a lateral release.  Knee was  irrigated with antibiotic solution during the closure.  Two mini Hemovac  drains were placed.  A sequential closure of layers was done with autogamy  Vicryl, subcu Vicryl, skin subcu Monocryl suture.  A sterile compressive  dressing applied.  Tourniquet was deflated and 1 g of Ancef given  intravenously.  Excellent circulation in the foot and ankle at the end of  the case.  Then gently awakened, and he was taken from the operating room,  taken in stable condition.  Sponge and needle counts were correct.  No  complications.   Decreased surgical time with help throughout this entire procedure, Mr.  Chad Fuller's assistance was needed.                                               Erasmo Leventhal, M.D.    RAC/MEDQ  D:  07/04/2004  T:  07/04/2004  Job:  [id number]

## 2011-05-15 NOTE — Discharge Summary (Signed)
NAMEMARLEY, [name]                           ACCOUNT NO.:  [id number]   MEDICAL RECORD NO.:  [id number]                   PATIENT TYPE:  INP   LOCATION:  0340                                 FACILITY:  [facility]   PHYSICIAN:  Corinna L. Lendell Caprice, MD             DATE OF BIRTH:  [birth year]   DATE OF ADMISSION:  07/17/2004  DATE OF DISCHARGE:  2020/11/1804                                 DISCHARGE SUMMARY   DIAGNOSES:  1. Acute right pulmonary embolus.  2. Status post inferior vena cava filter.  3. Constipation.  4. Hyponatremia.  5. Stable coronary artery disease.  6. Benign prostatic hypertrophy.  7. Reactive airway disease.  8. Status post recent left total knee replacement.  9. Degenerative joint disease.  10.      Diabetes.  11.      Hyperlipidemia.   DISCHARGE MEDICATIONS:  1. Vicodin one to two q.4h. p.r.n. pain.  2. Coumadin 5 mg p.o. daily.   He is to continue his other outpatient medications which include Glucotrol  10 mg p.o. daily; Glucophage XR 500 mg p.o. q.a.m., at noon, and 1000 mg  p.o. q.h.s. at supper; Proscar 5 mg p.o. daily; Zocor same dose; Lantus 22  units subcutaneously q.h.s.; Glyset 50 mg p.o. t.i.d. with meals.   ACTIVITY:  He is to continue physical therapy.   FOLLOWUP:  He is to follow up with Dr. Pete Glatter in two to four weeks. He  will have an INR drawn by home health this Friday with results to be called  to Dr. Pete Glatter.   CONDITION ON DISCHARGE:  Stable.   CONSULTATIONS:  Dr. Madilyn Fireman and Dr. Thomasena Edis, et al.   PROCEDURES:  IVC filter placement.   PERTINENT LABORATORIES:  On admission his sodium was 124 and at discharge it  was 132. His glucose on admission was 193. Otherwise, his basic metabolic  panel is unremarkable. INR on admission was 2.6 and at discharge was 2.1. D-  dimer was 1.76.  CBC was significant for a hemoglobin of 11.3, hematocrit  33.7, platelet count 627,000.   SPECIAL STUDIES AND RADIOLOGY:  1. CT of the chest showed: (1)  solitary segmental pulmonary embolus in the     superior segment of the right lower lobe pulmonary artery; (2) a 10-mm     posterior right upper lobe nodule.  2. Chest x-ray shows stable changes of COPD and a 10 x 7-mm nodular density     in the right mid lung zone.   HISTORY AND HOSPITAL COURSE:  Chad Fuller is a 78 year old white male who had  a recent total knee replacement. He had been on Coumadin postoperatively and  was therapeutic. Despite this he did have a pulmonary embolus. He presented  to the emergency room with complaints of shortness of breath. In the  emergency room he had an oxygen saturation of 98%, respiratory rate 20,  pulse 112, and his blood  pressure was normal. The patient had a normal  physical examination with a well healing scar no the knee. No edema.  He was  found to have hyponatremia and the acute pulmonary embolus. Also on CAT scan  there was a 10-mm left upper lobe nodule which had never been seen on  previous chest x-rays. The patient was admitted, he was found to have a  therapeutic INR and because of this an IVC filter was placed after getting  vitamin K and FFP. He was started back on Coumadin after the IVC filter was  placed and should remain on Coumadin for three to six months. With respect  to the pulmonary nodule, this can be followed up as an outpatient with  eventual bronchoscopy or at the very least repeat CAT scan. All of his other  medical problems remain stable during his hospitalization.  He continued  with his physical therapy and orthopedics followed along.                                               Corinna L. Lendell Caprice, MD    CLS/MEDQ  D:  07/23/2004  T:  07/23/2004  Job:  [id number]   cc:   Hal T. Stoneking, M.D.  [address]  [place], [place] [address]  Fax: [phone]   Everardo All. Madilyn Fireman, M.D.  [address], [address]  [place]  [place] [address]  Fax: [phone]   Erasmo Leventhal, M.D.  [facility]  [address]  [address]  [place]  [place] [address]  Fax: [phone]

## 2011-05-15 NOTE — H&P (Signed)
NAME:  Chad Fuller, Chad Fuller NO.:  [id number]   MEDICAL RECORD NO.:  [id number]                   PATIENT TYPE:   LOCATION:                                       FACILITY:   PHYSICIAN:  Erasmo Leventhal, M.D.         DATE OF BIRTH:  1926/07/10   DATE OF ADMISSION:  DATE OF DISCHARGE:                                HISTORY & PHYSICAL   CHIEF COMPLAINT:  Osteoarthritis of the left knee.   HISTORY OF PRESENT ILLNESS:  This is a 77 year old gentleman with a long  history of osteoarthritis of his left knee.  He has undergone conservative  management with injections, therapy, and oral medications, which have failed  to alleviate his symptoms.  Due to his continued symptoms, he presents today  for discussion of total knee arthroplasty of the left knee.  The surgery,  risks, benefits, and aftercare, including infection, stiffness, phlebitis  with possible PE, were all discussed.  Questions invited and answered.  The  patient opts to go ahead with the surgery.   PAST MEDICAL HISTORY:   DRUG ALLERGIES:  None.   CURRENT MEDICATIONS:  1. Lipitor 10 mg one daily.  2. Proscar 5 mg one daily.  3. Relafen 500 mg two daily.  He is to stop this one week prior to surgery.  4. Glyburide one tablet t.i.d.  5. Glipizide 10 mg one daily.  6. Glucophage 500 mg two b.i.d.  7. Glucosamine.  8. Chondroitin.  9. A Centrum Silver each day.   SERIOUS MEDICAL ILLNESSES:  1. Coronary artery disease.  2. Diabetes.  3. Hypercholesterolemia.   PREVIOUS SURGERIES:  Left knee arthroscopy.   FAMILY HISTORY:  Positive for congestive heart failure, diabetes, and  coronary artery disease.   SOCIAL HISTORY:  The patient is married.  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  He does not smoke or drink.   REVIEW OF SYSTEMS:  CENTRAL NERVOUS SYSTEM:  Negative for headache, blurred  vision, or dizziness.  PULMONARY:  Positive for a history of bronchitis.  Positive  for exertional shortness of breath.  Negative for PND and  orthopnea.  CARDIOVASCULAR:  Positive for a history of coronary artery  disease with stent placement.  No current cardiac symptoms.  GI:  Negative  for ulcers or hepatitis.  GU:  Positive for a history of prostatitis.  MUSCULOSKELETAL:  As in the HPI.   PHYSICAL EXAMINATION:  VITAL SIGNS:  Blood pressure 122/80, respirations 18,  pulse 76 and regular.  GENERAL APPEARANCE:  Well-developed, well-nourished gentleman in no acute  distress.  HEENT:  The head is normocephalic.  Nose is patent.  Pupils are equal, round  and reactive to light.  Throat without injection.  NECK:  Supple without adenopathy.  Carotids 2+ without bruit.  CHEST:  Clear to auscultation.  No rales or rhonchi.  Respirations 18.  HEART:  Regular rate and rhythm at  76 beats per minute without murmur.  ABDOMEN:  Soft with active bowel sounds.  No masses or organomegaly.  NEUROLOGIC:  The patient alert and oriented to time, place, and person.  Cranial nerves II-XII grossly intact.  EXTREMITIES:  Slight varus deformity to the knees bilaterally.  The left  knee has a 5 degree flexion contracture with further flexion to 125 degrees.  Dorsalis pedis and posterior tibialis pulses are 1+, and sensation and  circulation are intact, and again there is a varus deformity.   X-rays show end-stage osteoarthritis of the left knee.   IMPRESSION:  End-stage osteoarthritis of the left knee.   PLAN:  Total knee arthroplasty, left knee.     Jaquelyn Bitter. [name], P.A.                   Erasmo Leventhal, M.D.    SJC/MEDQ  D:  06/20/2004  T:  06/20/2004  Job:  [id number]

## 2011-05-15 NOTE — H&P (Signed)
NAME:  Chad Fuller, Chad Fuller NO.:  [id number]   MEDICAL RECORD NO.:  [id number]          PATIENT TYPE:  INP   LOCATION:  NA                           FACILITY:  [facility]   PHYSICIAN:  Ines Bloomer, M.D. DATE OF BIRTH:  1926-07-06   DATE OF ADMISSION:  10/22/2004  DATE OF DISCHARGE:                                HISTORY & PHYSICAL   CHIEF COMPLAINT:  Right upper lobe mass.   HISTORY OF PRESENT ILLNESS:  This is a 77 year old male status post left  total knee replacement in July of 2005, for which he had a postoperative  pulmonary embolism.  During evaluation for this, an IVC filter was placed,  and additionally he was started on Coumadin.  Also during this evaluation,  there was a finding suspicious in the right upper lobe that was confirmed on  CT scan to be a potential malignancy.  A PET scan was also reportedly done;  however, I do not have the results of this study, but again, as noted, this  was felt to be suspicious for malignancy, and the patient is to be admitted  for right upper lobe wedge resection of the lesion, and, depending on  findings of frozen section, may require further resection during the  procedure.  The patient does have some shortness of breath with a somewhat  remote history of asthma in his 40's.  He does have episodes of bronchitis  that are intermittent, and was a former smoker, having quit in 1975, but has  an approximately 50 pack year history.  He currently denies cough,  hemoptysis, chest pain, or other pain.  He is admitted this hospitalization  for the procedure.   PAST MEDICAL HISTORY:  1.  Pulmonary embolism status post IVC filter following a left total knee      replacement in July of 2005.  2.  Diabetes mellitus, insulin dependent.  3.  Coronary artery disease with a history of a PTCA of the LAD in 1995.  4.  History of BPH.  5.  Hypercholesterolemia.  6.  Degenerative joint disease.  7.  Remote asthma.  8.  Lumbar  laminectomy in 1975.  9.  Bilateral inguinal hernia repair.  10. Status post hemorrhoidectomy.   MEDICATIONS ON ADMISSION:  1.  Albuterol inhaler p.r.n.  2.  Glyset 50 mg t.i.d. with meals.  3.  Lipitor 10 mg b.i.d.  4.  Proscar 5 mg daily.  5.  Glipizide 10 mg daily.  6.  Glucophage XR 500 mg one in the morning, one at lunch, and two at diner.  7.  Lantus 22 units q.h.s.  8.  Coumadin was stopped on October 17, 2004.  He was on a 10 mg followed by      a 7.5 followed by a 7.5 mg rotating schedule.  9.  Multivitamin.  10. Glucosamine.   SOCIAL HISTORY:  Quit tobacco in 1975.  He has a 50 pack year history.  No  alcohol use.   FAMILY HISTORY:  He had a brother who died of throat cancer, and one brother  who died of  complications of diabetes and cirrhosis.   REVIEW OF SYMPTOMS:  CARDIAC:  He has occasional palpitations, occasional  shortness of breath.  PULMONARY:  Essentially unremarkable, except for  episodes of intermittent asthma symptoms and bronchitis symptoms, but none  currently.  ENDOCRINE:  Diabetes has been insulin dependent x12 years.  GI:  History of hemorrhoids.  GU:  History of benign prostatic hyperplasia,  controlled.  HEENT:  He has bilateral cataracts that are reportedly not bad  enough to operate.  NEUROLOGIC:  He has restless sleep, but otherwise no  focal symptoms.  MUSCULOSKELETAL:  He does have some continued left knee  difficulty, but has shown a significant improvement over time, and minimal  pain.   PHYSICAL EXAMINATION:  VITAL SIGNS:  Temperature 97.1, pulse 96,  respirations 18, blood pressure 151/86.  GENERAL:  This is a well-developed, elderly male in no acute distress.  HEENT:  Pupils equal, round and reactive to light.  Extraocular movements  intact.  Sclerae are anicteric.  Pharynx is clear.  He has upper dentures.  NECK:  Supple.  No bruits, no jugular venous distention, no adenopathy.  Trachea is midline.  PULMONARY:  Clear breath sounds  bilaterally without abnormal adventitious  sounds.  CARDIAC:  Regular rate and rhythm.  Normal S1 and S2 without murmurs,  gallops, rubs.  ABDOMEN:  Soft, nontender.  Normoactive bowel sounds.  No masses, no  organomegaly, no audible bruits.  EXTREMITIES:  No clubbing, cyanosis, or edema, with the exception of minimal  edema of the left lower extremity at about the ankle region.  NEUROLOGIC:  Grossly nonfocal.  MUSCULOSKELETAL:  Strength is equal bilaterally.  Gait is not tested.  SKIN:  Some small angiomata.  PULSES:  Pulses are equal and intact bilaterally.   ASSESSMENT:  Right upper lobe mass, suspicious for malignancy.   PLAN:  As outlined for video-assisted thoracoscopy and other procedures  pending the intraoperative findings.       WEG/MEDQ  D:  10/20/2004  T:  10/20/2004  Job:  [id number]   cc:   Hal T. Stoneking, M.D.  [address]  [place], [place] [address]  Fax: [phone]   Valma Cava, M.D.

## 2011-05-15 NOTE — Discharge Summary (Signed)
Chad Fuller, MILBY NO.:  [id number]   MEDICAL RECORD NO.:  [id number]          PATIENT TYPE:  INP   LOCATION:  3306                         FACILITY:  [facility]   PHYSICIAN:  Ines Bloomer, M.D. DATE OF BIRTH:  01/13/1926   DATE OF ADMISSION:  10/22/2004  DATE OF DISCHARGE:  10/27/2004                                 DISCHARGE SUMMARY   ANTICIPATED DATE OF DISCHARGE:  October 27, 2004.   ADMISSION DIAGNOSIS:  Right upper lobe lung lesion.   DISCHARGE/SECONDARY DIAGNOSES:  1.  Right upper lobe lung lesion, status post right upper lobe wedge      resection.  Pathology showed squamous cell carcinoma, moderately      differentiated, T1-N0-MX.  2.  History of pulmonary embolism, status post inferior vena cava filter      following a left total knee replacement in July of 2005.  His Coumadin      is followed by Dr. Pete Glatter.  3.  Diabetes mellitus, insulin dependent.  4.  Coronary artery disease with a history of percutaneous transluminal      coronary angiography of the left anterior descending in 1995.  5.  History of benign prostatic hypertrophy.  6.  Hypercholesterolemia.  7.  Degenerative joint disease.  8.  Remote asthma.  9.  Lumbar laminectomy in 1975.  10. Bilateral inguinal hernia repair.  11. Status post hemorrhoidectomy.   PROCEDURES:  Right video assisted thoracoscopic surgery, mini thoracotomy  and wedge resection of his right upper lobe lung lesion with node sampling  by Ines Bloomer, M.D., on October 22, 2004.   CONSULTS:  Medical oncology.   BRIEF HISTORY:  Mr. Chad Fuller is a 77 year old Caucasian male, status post left  total knee replacement in July of 2005 for which he had a postoperative  pulmonary embolism.  An IVC filter was placed and he was started on  Coumadin.  During evaluation for this, there was a finding suspicious in the  right upper lobe that was confirmed on CT scan to be a potential malignancy.  A PET scan was then  performed on September 11, 2004, which showed increased  metabolic activity within the nodule and the inferior right upper lobe.  There was no evidence of metastatic disease elsewhere in the neck, chest,  abdomen or pelvis.  Based on this finding, he was referred to Ines Bloomer, M.D., of [facility] for surgical consideration.  After review of the  patient's films and examination of the patient, Dr. Edwyna Shell felt Mr. Chad Fuller  should undergo a right VATS with possible thoracotomy for right upper lobe  wedge resection.  After discussing the risks, benefits and alternatives, Mr.  Chad Fuller agreed to proceed and his surgery was tentatively scheduled for October 22, 2004.  Preoperatively Mr. Chad Fuller did report some shortness of breath with  a somewhat remote history of asthma in his 22s.  He did report episodes of  bronchitis that are intermittent.  He is a former smoker, having quit in  1975, but has an appropriate 50-pack-year history.  He denied cough,  hemoptysis and chest pain.  HOSPITAL COURSE:  Mr. Chad Fuller was electively admitted to [facility] on  October 22, 2004, to undergo wedge resection of his right upper lobe lung  lesion.  Ultimately pathology showed squamous cell carcinoma of T1, N0, MX.  Lajuana Matte, M.D., was consulted postoperatively and felt that due to  the early stages there would be no benefit to adjuvant chemotherapy.  However, he did feel he would need close monitoring with repeat chest CT  scan every three months for at least the first two years.  He will also be  considered for a clinical trial with selenium versus placebo for  chemotherapy prevention six months after surgery if there is no evidence of  recurrence.  He will be seen in three months at the [facility]  by Dr. Arbutus Ped.   In regards to his postoperative course, Mr. Chad Fuller did relatively well.  Intraoperatively two chest tubes were placed.  Initially his pain was  controlled with morphine PCA.   On postoperative day #1, he remained stable  and was saturating 97% on room air.  His chest tube output showed only 55 mL  overnight and there was no air leak.  His anterior chest tube was  discontinued that day.  Postoperative laboratories were also stable other  than elevated blood sugars.  His blood sugars were initially managed with  Lantus regimen with gradual introduction of his oral hypoglycemic agents.   Over the next several days, Mr. Chad Fuller made good progress.  He remained  afebrile with stable vital signs.  His blood sugar remained elevated, but  with improvement.  Just prior to discharge, his sugars were running around  150-170.  His Lantus insulin was increased slightly from his home regimen.  His remaining chest tube was discontinued on postoperative day #2.  His  followup chest x-ray showed no evidence.  There was some atelectasis noted  in his mid and right upper lobes.  Pulmonary toilet was encouraged.  His  mobilization was also increased.  His Foley catheter and PCA were also  discontinued, as well as additional IV fluids.  His pain was managed on oral  medications.  He was also able to void without difficulty.  He was  tolerating oral diet.  Initially when ambulating, he required a rolling  walker with one-person assist.  Therefore, a physical therapy consult was  ordered, but is pending at the time of this dictation.  However, the next  few days the nurses reported he made good progress with mobility and was  ambulating with supervision.  It was not felt that he would need home health  physical therapy, however, his progress will be further assessed over the  next 24 hours and arrangements will be made as deemed appropriate.  On exam,  his wounds remain clean and dry without signs of infection.  He did report  some intermittent shortness of breath, however, his saturations remained good on room air.  Albuterol nebulizer treatments were ordered as needed as  he does use  albuterol at home.  He was also started on prophylactic Avelox.   It is anticipated that Mr. Chad Fuller will be ready for discharge home on October 27, 2004.  Official discharge orders to be written pending the patient's  data on morning rounds.   LABORATORY DATA:  Laboratories on October 26, 2004, showed a PT and INR of  14.2 and 1.1, respectively.  White blood cell count 6.7, hemoglobin 11.4,  hematocrit 32.8, platelet count 346.  Sodium 132, potassium 3.9, chloride  96, CO2 31, blood glucose 157, BUN 13, creatinine 0.7, calcium 8.2.  Operative cultures of his lung showed no organisms x 3 days.  No yeast or  fungal elements were seen.  Preliminary acid-fast bacilli cultures were also  negative.   DISCHARGE MEDICATIONS:  1.  Lipitor 10 mg one p.o. b.i.d.  2.  Glipizide 10 mg one p.o. daily.  3.  Glyset 50 mg one p.o. t.i.d. with meals.  4.  Proscar 5 mg one p.o. daily.  5.  Lantus insulin 28 units subcutaneously at bedtime (increased from 22      units).  6.  Multivitamins one p.o. daily.  7.  Glucophage XR 500 mg one tablet each morning at lunch and two tablets at      dinner.  8.  Albuterol inhaler two puffs q.4h. p.r.n. shortness of breath.  9.  Coumadin.  Final dose to be determined at the time of discharge based on      morning INR.  According to his history and physical, his preoperative      Coumadin regimen was 10 mg followed by 7.5 mg followed by a 7.5 mg      rotating schedule.  10. Avelox 400 mg one p.o. daily x 5 more days.  11. Tylox one to two tablets p.o. q.4h. p.r.n. pain.   ACTIVITY:  He is instructed to avoid driving, heavy lifting or strenuous  activity.  He was encouraged to continue his daily breathing and walking  exercises.   DIET:  He is to follow a diabetic appropriate, low-fat, low-salt diet.   WOUND CARE:  He may shower daily with soap and water.  He should notify the  [facility] office if he develops fever greater than 101 degrees, redness or  drainage from  his incision site or for increasing shortness of breath.   FOLLOWUP:  1.  He is to call to schedule laboratory work at Dr. Laverle Hobby office for      two to three days after his discharge to recheck his INR.  2.  He is to follow up with Dr. Edwyna Shell at the [facility] office in 7-10 days.  He      is to have a chest x-ray one hour before this appointment at the      [facility].  The [facility] office will contact him      regarding specific appointment date and times.  3.  He is to follow up with Lajuana Matte, M.D., at the [facility] in three months.  Their office should contact him with      appointment date and time.       AWZ/MEDQ  D:  10/26/2004  T:  10/26/2004  Job:  [id number]   cc:   Hal T. Stoneking, M.D.  [address]  [place], [place] [address]  Fax: [phone]   Lajuana Matte, MD  Fax: [phone]   Erasmo Leventhal, M.D. [facility]  [address]  [address]  [place]  [place] [address]  Fax: [phone]

## 2011-06-12 ENCOUNTER — Other Ambulatory Visit: Payer: Self-pay | Admitting: Geriatric Medicine

## 2011-06-12 DIAGNOSIS — R748 Abnormal levels of other serum enzymes: Secondary | ICD-10-CM

## 2011-06-15 ENCOUNTER — Ambulatory Visit
Admission: RE | Admit: 2011-06-15 | Discharge: 2011-06-15 | Disposition: A | Discharge status: Home / Self Care | Payer: Medicare Other | Source: Ambulatory Visit | Attending: Geriatric Medicine | Admitting: Geriatric Medicine

## 2011-06-15 ENCOUNTER — Other Ambulatory Visit: Payer: Medicare Other

## 2011-06-15 DIAGNOSIS — R748 Abnormal levels of other serum enzymes: Secondary | ICD-10-CM

## 2011-12-05 ENCOUNTER — Emergency Department (HOSPITAL_COMMUNITY): Payer: Medicare Other

## 2011-12-05 ENCOUNTER — Encounter: Payer: Self-pay | Admitting: *Deleted

## 2011-12-05 ENCOUNTER — Emergency Department (HOSPITAL_COMMUNITY)
Admission: EM | Admit: 2011-12-05 | Discharge: 2011-12-05 | Disposition: A | Discharge status: Home / Self Care | Payer: Medicare Other | Attending: Emergency Medicine | Admitting: Emergency Medicine

## 2011-12-05 DIAGNOSIS — R5381 Other malaise: Secondary | ICD-10-CM | POA: Insufficient documentation

## 2011-12-05 DIAGNOSIS — Z794 Long term (current) use of insulin: Secondary | ICD-10-CM | POA: Insufficient documentation

## 2011-12-05 DIAGNOSIS — E119 Type 2 diabetes mellitus without complications: Secondary | ICD-10-CM | POA: Insufficient documentation

## 2011-12-05 DIAGNOSIS — Z79899 Other long term (current) drug therapy: Secondary | ICD-10-CM | POA: Insufficient documentation

## 2011-12-05 DIAGNOSIS — R0602 Shortness of breath: Secondary | ICD-10-CM | POA: Insufficient documentation

## 2011-12-05 DIAGNOSIS — R195 Other fecal abnormalities: Secondary | ICD-10-CM | POA: Insufficient documentation

## 2011-12-05 DIAGNOSIS — K921 Melena: Secondary | ICD-10-CM

## 2011-12-05 DIAGNOSIS — K644 Residual hemorrhoidal skin tags: Secondary | ICD-10-CM | POA: Insufficient documentation

## 2011-12-05 DIAGNOSIS — Z85118 Personal history of other malignant neoplasm of bronchus and lung: Secondary | ICD-10-CM | POA: Insufficient documentation

## 2011-12-05 DIAGNOSIS — R5383 Other fatigue: Secondary | ICD-10-CM | POA: Insufficient documentation

## 2011-12-05 HISTORY — DX: Malignant (primary) neoplasm, unspecified: C80.1

## 2011-12-05 HISTORY — DX: Benign prostatic hyperplasia without lower urinary tract symptoms: N40.0

## 2011-12-05 HISTORY — DX: Personal history of other venous thrombosis and embolism: Z86.718

## 2011-12-05 LAB — CBC
Hemoglobin: 13.8 g/dL (ref 13.0–17.0)
MCH: 31.6 pg (ref 26.0–34.0)
MCV: 88.3 fL (ref 78.0–100.0)
RBC: 4.37 MIL/uL (ref 4.22–5.81)

## 2011-12-05 LAB — DIFFERENTIAL
Eosinophils Absolute: 0.2 10*3/uL (ref 0.0–0.7)
Lymphs Abs: 1.6 10*3/uL (ref 0.7–4.0)
Monocytes Relative: 9 % (ref 3–12)
Neutrophils Relative %: 54 % (ref 43–77)

## 2011-12-05 LAB — BASIC METABOLIC PANEL
BUN: 13 mg/dL (ref 6–23)
GFR calc non Af Amer: 88 mL/min — ABNORMAL LOW (ref >90)
Glucose, Bld: 135 mg/dL — ABNORMAL HIGH (ref 70–99)
Potassium: 3.5 mEq/L (ref 3.5–5.1)

## 2011-12-05 MED ORDER — PANTOPRAZOLE SODIUM 40 MG IV SOLR
40.0000 mg | Freq: Once | INTRAVENOUS | Status: AC
  Administered 2011-12-05: 40 mg via INTRAVENOUS
  Filled 2011-12-05: qty 40

## 2011-12-05 NOTE — ED Provider Notes (Signed)
History    this is an 77 year old male presenting to the ED with the chief complaints of black stool. Patient states for the past week he has been having a stomach virus. He recalls several days ago while belching he notice pain to his upper abdomen, and subsequently noticed grayish stool. Last night he has used the bathroom and had more than 5 bowel movement with the actual. He denies significant abdominal pain. He does report weakness, feeling tired, and had some shortness of breath with exertion. Patient states his shortness of breath is chronic, since he was diagnosed with lung cancer and had a lobectomy performed in the past. Patient also admits to taking BC powder 3-4 times weakly for his  Headache.  He has a history of GERD, but states protonix has helped.  Currently he denies fever, headache, chest pain, abdominal pain, back pain, dysuria, constipation.      CSN: [id number] Arrival date & time: 12/05/2011  9:42 AM   First MD Initiated Contact with Patient 12/05/11 1021      Chief Complaint  Patient presents with  . Rectal Bleeding    (Consider location/radiation/quality/duration/timing/severity/associated sxs/prior treatment) HPI  Past Medical History  Diagnosis Date  . Diabetes mellitus   . Cancer     lung  . Prostate enlargement   . History of blood clots     Past Surgical History  Procedure Date  . Lung removal, partial   . Coronary stent placement   . Knee surgery   . Filtering procedure     reports filter placed after knee surgery for blood clots    History reviewed. No pertinent family history.  History  Substance Use Topics  . Smoking status: Former Smoker    Types: Cigarettes  . Smokeless tobacco: Not on file  . Alcohol Use: No      Review of Systems  All other systems reviewed and are negative.    Allergies  Review of patient's allergies indicates not on file.  Home Medications   Current Outpatient Rx  Name Route Sig Dispense Refill  .  FINASTERIDE 5 MG PO TABS Oral Take 5 mg by mouth daily.      Marland Kitchen GLIPIZIDE ER 10 MG PO TB24 Oral Take 10 mg by mouth daily.      Marland Kitchen GLUCOSAMINE CHONDROITIN COMPLX PO Oral Take 2 tablets by mouth daily.      Marland Kitchen HYDROCHLOROTHIAZIDE 25 MG PO TABS Oral Take 25 mg by mouth daily.      . INSULIN GLARGINE 100 UNIT/ML [place] SOLN Subcutaneous Inject 20-60 Units into the skin at bedtime. Dosed based on sugar levels:  60 units in morning; 20-40 units in evening     . THERA M PLUS PO TABS Oral Take 1 tablet by mouth daily.      Marland Kitchen PANTOPRAZOLE SODIUM 20 MG PO TBEC Oral Take 20 mg by mouth daily.      Marland Kitchen SIMVASTATIN 10 MG PO TABS Oral Take 10 mg by mouth at bedtime.      Marland Kitchen VISINE OP Both Eyes Place 1 drop into both eyes as needed. Itching        BP 139/81  Pulse 71  Temp(Src) 98.7 F (37.1 C) (Oral)  Resp 20  SpO2 98%  Physical Exam  Nursing note and vitals reviewed. Constitutional: He appears well-developed and well-nourished.       Awake, alert, nontoxic appearance  HENT:  Head: Atraumatic.  Eyes: Conjunctivae and EOM are normal. Pupils are equal, round, and  reactive to light. Right eye exhibits no discharge. Left eye exhibits no discharge.  Neck: Neck supple.  Cardiovascular: Normal rate and regular rhythm.   Pulmonary/Chest: Effort normal. No respiratory distress. He has no wheezes. He has no rales. He exhibits no tenderness.  Abdominal: Soft. There is no tenderness. There is no rebound.  Genitourinary: Guaiac positive stool.       Melenic stool.  Rectal tone normal, external hemorrhoids, no rectal pain  Musculoskeletal: He exhibits no tenderness.       Baseline ROM, no obvious new focal weakness  Neurological:       Mental status and motor strength appears baseline for patient and situation  Skin: No rash noted.  Psychiatric: He has a normal mood and affect.    ED Course  Procedures (including critical care time)  Labs Reviewed - No data to display No results found.   No diagnosis  found.    MDM  The patient has upper GI bleed likely secondary to regular BC powder consumption.  Will obtain xray to r/o perforation, however less likely as pt has benign abdomen.    1:33 PM Although he does have black stool on examination, he has a negative Hemoccult. He has a benign abdomen, and no evidence of anemia. Upon requestioning, and the patient admits to using Pepto-Bismol for the past week which corresponds to him having black stool.  At this time, this vessel was likely related to Pepto-Bismol use, and then had an upper GI bleed. He has normal vital signs. He does have Dr. Pete Glatter as his primary care provider and agrees to follow up with him. This will be discharged, with recommendation to return if symptoms persist. I recommend to stop taking the Peptol Bismol this time.      Fayrene Helper, PA 12/05/11 1343

## 2011-12-05 NOTE — ED Notes (Signed)
Ortho vital signs lying- 136/69 hr-72    Sitting 140/73 hr-72   Standing-139/80 hr-80

## 2011-12-05 NOTE — ED Provider Notes (Signed)
Medical screening examination/treatment/procedure(s) were performed by non-physician practitioner and as supervising physician I was immediately available for consultation/collaboration.  Ethelda Chick, MD 12/05/11 [phone]

## 2011-12-05 NOTE — ED Notes (Signed)
Pt states a couple days ago started having grey stool but this am around 2am had very dark stool. Reports he has went to the bathroom several times since this am. Denies abdominal pain. Reports nausea. Pt reports weakness, fatigue, and mild sob with exertion.

## 2012-01-01 ENCOUNTER — Telehealth: Payer: Self-pay | Admitting: Internal Medicine

## 2012-01-01 NOTE — Telephone Encounter (Signed)
CONVERTED 1/8 APPT TO EPIC AND MOVED TO 1/24 DUE TO MM OUT OF OFFICE. LMONVM FOR PT RE CHANGE W/NEW D/T.

## 2012-01-21 ENCOUNTER — Other Ambulatory Visit: Payer: Self-pay | Admitting: Internal Medicine

## 2012-01-21 ENCOUNTER — Telehealth: Payer: Self-pay | Admitting: Internal Medicine

## 2012-01-21 ENCOUNTER — Ambulatory Visit (HOSPITAL_COMMUNITY)
Admission: RE | Admit: 2012-01-21 | Discharge: 2012-01-21 | Disposition: A | Discharge status: Home / Self Care | Payer: Medicare Other | Source: Ambulatory Visit | Attending: Internal Medicine | Admitting: Internal Medicine

## 2012-01-21 ENCOUNTER — Ambulatory Visit: Payer: Medicare Other | Admitting: Internal Medicine

## 2012-01-21 ENCOUNTER — Other Ambulatory Visit (HOSPITAL_BASED_OUTPATIENT_CLINIC_OR_DEPARTMENT_OTHER): Payer: Medicare Other | Admitting: Lab

## 2012-01-21 DIAGNOSIS — E119 Type 2 diabetes mellitus without complications: Secondary | ICD-10-CM | POA: Diagnosis not present

## 2012-01-21 DIAGNOSIS — C3410 Malignant neoplasm of upper lobe, unspecified bronchus or lung: Secondary | ICD-10-CM | POA: Diagnosis not present

## 2012-01-21 DIAGNOSIS — Z85118 Personal history of other malignant neoplasm of bronchus and lung: Secondary | ICD-10-CM | POA: Diagnosis not present

## 2012-01-21 DIAGNOSIS — J984 Other disorders of lung: Secondary | ICD-10-CM | POA: Diagnosis not present

## 2012-01-21 DIAGNOSIS — R0602 Shortness of breath: Secondary | ICD-10-CM | POA: Insufficient documentation

## 2012-01-21 DIAGNOSIS — C801 Malignant (primary) neoplasm, unspecified: Secondary | ICD-10-CM

## 2012-01-21 LAB — CBC WITH DIFFERENTIAL/PLATELET
Basophils Absolute: 0 10*3/uL (ref 0.0–0.1)
Eosinophils Absolute: 0.2 10*3/uL (ref 0.0–0.5)
HCT: 43 % (ref 38.4–49.9)
HGB: 14.7 g/dL (ref 13.0–17.1)
LYMPH%: 33.1 % (ref 14.0–49.0)
MCV: 93.1 fL (ref 79.3–98.0)
MONO%: 7.5 % (ref 0.0–14.0)
NEUT#: 3.6 10*3/uL (ref 1.5–6.5)
NEUT%: 56.7 % (ref 39.0–75.0)
Platelets: 281 10*3/uL (ref 140–400)
RBC: 4.62 10*6/uL (ref 4.20–5.82)

## 2012-01-21 LAB — COMPREHENSIVE METABOLIC PANEL
Alkaline Phosphatase: 132 U/L — ABNORMAL HIGH (ref 39–117)
BUN: 14 mg/dL (ref 6–23)
Creatinine, Ser: 0.87 mg/dL (ref 0.50–1.35)
Glucose, Bld: 256 mg/dL — ABNORMAL HIGH (ref 70–99)
Total Bilirubin: 0.5 mg/dL (ref 0.3–1.2)

## 2012-01-21 NOTE — Telephone Encounter (Signed)
appt for 1/24 r/s to 1/31 due to pt late. Pt had lab and sent for cxr per 12/30/2010 pof.

## 2012-01-22 ENCOUNTER — Telehealth: Payer: Self-pay | Admitting: Internal Medicine

## 2012-01-22 NOTE — Telephone Encounter (Signed)
pt had l/m 1/24 to r/s 1/31 appt as he is going out of town.  Appt r/s to 2/12   aom

## 2012-01-28 ENCOUNTER — Ambulatory Visit: Payer: Medicare Other | Admitting: Internal Medicine

## 2012-02-03 DIAGNOSIS — E1142 Type 2 diabetes mellitus with diabetic polyneuropathy: Secondary | ICD-10-CM | POA: Diagnosis not present

## 2012-02-03 DIAGNOSIS — E1149 Type 2 diabetes mellitus with other diabetic neurological complication: Secondary | ICD-10-CM | POA: Diagnosis not present

## 2012-02-03 DIAGNOSIS — Z79899 Other long term (current) drug therapy: Secondary | ICD-10-CM | POA: Diagnosis not present

## 2012-02-03 DIAGNOSIS — E782 Mixed hyperlipidemia: Secondary | ICD-10-CM | POA: Diagnosis not present

## 2012-02-04 DIAGNOSIS — Z79899 Other long term (current) drug therapy: Secondary | ICD-10-CM | POA: Diagnosis not present

## 2012-02-04 DIAGNOSIS — E782 Mixed hyperlipidemia: Secondary | ICD-10-CM | POA: Diagnosis not present

## 2012-02-04 DIAGNOSIS — E119 Type 2 diabetes mellitus without complications: Secondary | ICD-10-CM | POA: Diagnosis not present

## 2012-02-09 ENCOUNTER — Ambulatory Visit (HOSPITAL_BASED_OUTPATIENT_CLINIC_OR_DEPARTMENT_OTHER): Payer: Medicare Other | Admitting: Internal Medicine

## 2012-02-09 VITALS — BP 150/86 | HR 78 | Temp 96.5°F | Ht 73.0 in | Wt 239.8 lb

## 2012-02-09 DIAGNOSIS — C3490 Malignant neoplasm of unspecified part of unspecified bronchus or lung: Secondary | ICD-10-CM

## 2012-02-09 NOTE — Progress Notes (Signed)
[facility] OFFICE PROGRESS NOTE PRINCIPAL DIAGNOSIS:  Stage IA (T1N0MX) non-small cell lung cancer, moderately differentiated squamous cell carcinoma diagnosed in October 2005.  PRIOR THERAPY:  Status post right upper lobe wedge resection with lymph node dissection under the care of Dr Edwyna Shell on December 22, 2004.  CURRENT THERAPY:  Observation.  INTERVAL HISTORY: Chad Fuller 78 y.o. male returns to the clinic today for annual followup visit. He has no complaints today except for shortness of breath with exertion. He denied having any significant chest pain, no cough or hemoptysis. He has no weight loss or night sweats. The patient has repeat CBC, comprehensive with Augmentin and chest x-ray performed recently and he is here today for evaluation and discussion of his lab and imaging results.  MEDICAL HISTORY: Past Medical History  Diagnosis Date  . Diabetes mellitus   . Cancer     lung  . Prostate enlargement   . History of blood clots     ALLERGIES:   has no known allergies.  MEDICATIONS:  Current Outpatient Prescriptions  Medication Sig Dispense Refill  . ezetimibe (ZETIA) 10 MG tablet Take 10 mg by mouth daily.      . finasteride (PROSCAR) 5 MG tablet Take 5 mg by mouth daily.        Marland Kitchen glipiZIDE (GLUCOTROL XL) 10 MG 24 hr tablet Take 10 mg by mouth daily.        Marland Kitchen GLUCOSAMINE CHONDROITIN COMPLX PO Take 2 tablets by mouth daily.        . hydrochlorothiazide (HYDRODIURIL) 25 MG tablet Take 25 mg by mouth daily.        . insulin glargine (LANTUS) 100 UNIT/ML injection Inject 20-60 Units into the skin at bedtime. Dosed based on sugar levels:  60 units in morning; 20-40 units in evening       . Multiple Vitamins-Minerals (MULTIVITAMINS THER. W/MINERALS) TABS Take 1 tablet by mouth daily.        . pantoprazole (PROTONIX) 20 MG tablet Take 20 mg by mouth daily.        . Tetrahydrozoline HCl (VISINE OP) Place 1 drop into both eyes as needed. Itching           SURGICAL HISTORY:  Past Surgical History  Procedure Date  . Lung removal, partial   . Coronary stent placement   . Knee surgery   . Filtering procedure     reports filter placed after knee surgery for blood clots    REVIEW OF SYSTEMS:  A comprehensive review of systems was negative except for: Respiratory: positive for dyspnea on exertion   PHYSICAL EXAMINATION: General appearance: alert, cooperative and no distress Lymph nodes: Cervical, supraclavicular, and axillary nodes normal. Resp: clear to auscultation bilaterally Cardio: regular rate and rhythm, S1, S2 normal, no murmur, click, rub or gallop GI: soft, non-tender; bowel sounds normal; no masses,  no organomegaly Extremities: extremities normal, atraumatic, no cyanosis or edema Neurologic: Alert and oriented X 3, normal strength and tone. Normal symmetric reflexes. Normal coordination and gait  ECOG PERFORMANCE STATUS: 1 - Symptomatic but completely ambulatory  Blood pressure 150/86, pulse 78, temperature 96.5 F (35.8 C), height 6\' 1"  (1.854 m), weight 239 lb 12.8 oz (108.773 kg).  LABORATORY DATA: Lab Results  Component Value Date   WBC 6.4 01/21/2012   HGB 14.7 01/21/2012   HCT 43.0 01/21/2012   MCV 93.1 01/21/2012   PLT 281 01/21/2012      Chemistry  Component Value Date/Time   NA 137 01/21/2012 1312   K 4.2 01/21/2012 1312   CL 100 01/21/2012 1312   CO2 27 01/21/2012 1312   BUN 14 01/21/2012 1312   CREATININE 0.87 01/21/2012 1312      Component Value Date/Time   CALCIUM 9.0 01/21/2012 1312   ALKPHOS 132* 01/21/2012 1312   AST 27 01/21/2012 1312   ALT 20 01/21/2012 1312   BILITOT 0.5 01/21/2012 1312       RADIOGRAPHIC STUDIES: Dg Chest 2 View  01/21/2012  *RADIOLOGY REPORT*  Clinical Data: Shortness of breath, history of lung cancer, diabetes  CHEST - 2 VIEW  Comparison: December 30, 2010 and December 30, 2009  Findings: Right suprahilar scarring is unchanged.  Otherwise, both lungs are clear.  The cardiac  silhouette, mediastinum, pulmonary vasculature are within normal limits.  There are ankylosing degenerative changes within the axial spine.  IMPRESSION: Stable chest x-ray with no evidence of acute cardiac or pulmonary process.  Original Report Authenticated By: Brandon Melnick, M.D.    ASSESSMENT: This is a very pleasant 78 years old white male with history of stage IA non-small cell lung cancer diagnosed more than 7 years ago, status post right upper lobe wedge resection. He is currently on observation with no evidence for disease recurrence.  PLAN: I discussed the lab and x-ray results with the patient I recommended for him continuous observation for now with repeat chest x-ray in one year. He would come back for followup visit at that time. He was advised to call me immediately if he has any concerning symptoms in the interval.   All questions were answered. The patient knows to call the clinic with any problems, questions or concerns. We can certainly see the patient much sooner if necessary.

## 2012-02-11 ENCOUNTER — Telehealth: Payer: Self-pay | Admitting: Internal Medicine

## 2012-02-11 NOTE — Telephone Encounter (Signed)
pt aware of 02/09/13 appt  aom

## 2012-05-09 DIAGNOSIS — N4340 Spermatocele of epididymis, unspecified: Secondary | ICD-10-CM | POA: Diagnosis not present

## 2012-05-09 DIAGNOSIS — N529 Male erectile dysfunction, unspecified: Secondary | ICD-10-CM | POA: Diagnosis not present

## 2012-05-09 DIAGNOSIS — N400 Enlarged prostate without lower urinary tract symptoms: Secondary | ICD-10-CM | POA: Diagnosis not present

## 2012-05-09 DIAGNOSIS — N320 Bladder-neck obstruction: Secondary | ICD-10-CM | POA: Diagnosis not present

## 2012-05-26 DIAGNOSIS — L820 Inflamed seborrheic keratosis: Secondary | ICD-10-CM | POA: Diagnosis not present

## 2012-05-26 DIAGNOSIS — D235 Other benign neoplasm of skin of trunk: Secondary | ICD-10-CM | POA: Diagnosis not present

## 2012-06-24 DIAGNOSIS — E119 Type 2 diabetes mellitus without complications: Secondary | ICD-10-CM | POA: Diagnosis not present

## 2012-08-03 ENCOUNTER — Other Ambulatory Visit: Payer: Self-pay

## 2012-08-03 DIAGNOSIS — R0600 Dyspnea, unspecified: Secondary | ICD-10-CM

## 2012-08-03 DIAGNOSIS — Z0000 Encounter for general adult medical examination without abnormal findings: Secondary | ICD-10-CM | POA: Diagnosis not present

## 2012-08-03 DIAGNOSIS — R0602 Shortness of breath: Secondary | ICD-10-CM | POA: Diagnosis not present

## 2012-08-03 DIAGNOSIS — Z1331 Encounter for screening for depression: Secondary | ICD-10-CM | POA: Diagnosis not present

## 2012-08-03 DIAGNOSIS — IMO0002 Reserved for concepts with insufficient information to code with codable children: Secondary | ICD-10-CM

## 2012-08-04 ENCOUNTER — Other Ambulatory Visit: Payer: Self-pay | Admitting: Geriatric Medicine

## 2012-08-04 DIAGNOSIS — R911 Solitary pulmonary nodule: Secondary | ICD-10-CM

## 2012-08-05 ENCOUNTER — Ambulatory Visit
Admission: RE | Admit: 2012-08-05 | Discharge: 2012-08-05 | Disposition: A | Discharge status: Home / Self Care | Payer: Medicare Other | Source: Ambulatory Visit | Attending: Geriatric Medicine | Admitting: Geriatric Medicine

## 2012-08-05 ENCOUNTER — Encounter (HOSPITAL_COMMUNITY): Payer: Medicare Other

## 2012-08-05 DIAGNOSIS — J984 Other disorders of lung: Secondary | ICD-10-CM | POA: Diagnosis not present

## 2012-08-05 DIAGNOSIS — R911 Solitary pulmonary nodule: Secondary | ICD-10-CM

## 2012-08-05 DIAGNOSIS — K449 Diaphragmatic hernia without obstruction or gangrene: Secondary | ICD-10-CM | POA: Diagnosis not present

## 2012-08-05 MED ORDER — IOHEXOL 300 MG/ML  SOLN: 75.0000 mL | Freq: Once | INTRAMUSCULAR | Status: AC | PRN

## 2012-08-12 DIAGNOSIS — L03019 Cellulitis of unspecified finger: Secondary | ICD-10-CM | POA: Diagnosis not present

## 2012-08-16 ENCOUNTER — Ambulatory Visit (HOSPITAL_BASED_OUTPATIENT_CLINIC_OR_DEPARTMENT_OTHER)
Admission: RE | Admit: 2012-08-16 | Discharge: 2012-08-16 | Disposition: A | Discharge status: Home / Self Care | Payer: Medicare Other | Source: Ambulatory Visit | Attending: Orthopedic Surgery | Admitting: Orthopedic Surgery

## 2012-08-16 ENCOUNTER — Encounter (HOSPITAL_BASED_OUTPATIENT_CLINIC_OR_DEPARTMENT_OTHER)
Admission: RE | Disposition: A | Discharge status: Home / Self Care | Payer: Self-pay | Source: Ambulatory Visit | Attending: Orthopedic Surgery

## 2012-08-16 ENCOUNTER — Encounter (HOSPITAL_BASED_OUTPATIENT_CLINIC_OR_DEPARTMENT_OTHER): Payer: Self-pay | Admitting: Orthopedic Surgery

## 2012-08-16 DIAGNOSIS — E119 Type 2 diabetes mellitus without complications: Secondary | ICD-10-CM | POA: Insufficient documentation

## 2012-08-16 DIAGNOSIS — L03019 Cellulitis of unspecified finger: Secondary | ICD-10-CM | POA: Diagnosis not present

## 2012-08-16 DIAGNOSIS — IMO0002 Reserved for concepts with insufficient information to code with codable children: Secondary | ICD-10-CM | POA: Diagnosis not present

## 2012-08-16 HISTORY — PX: I & D EXTREMITY: SHX5045

## 2012-08-16 SURGERY — MINOR IRRIGATION AND DEBRIDEMENT EXTREMITY
Anesthesia: LOCAL | Site: Thumb | Laterality: Right | Wound class: Dirty or Infected

## 2012-08-16 MED ORDER — SULFAMETHOXAZOLE-TRIMETHOPRIM 800-160 MG PO TABS: 1.0000 | ORAL_TABLET | Freq: Two times a day (BID) | ORAL | Status: AC

## 2012-08-16 MED ORDER — LIDOCAINE HCL 1 % IJ SOLN
INTRAMUSCULAR | Status: DC | PRN
  Administered 2012-08-16: 13:00:00 via INTRAMUSCULAR

## 2012-08-16 MED ORDER — HYDROCODONE-ACETAMINOPHEN 5-325 MG PO TABS: ORAL_TABLET | ORAL | Status: DC

## 2012-08-16 SURGICAL SUPPLY — 22 items
BANDAGE GAUZE STRT 1 STR LF (GAUZE/BANDAGES/DRESSINGS) ×2 IMPLANT
BLADE SURG 15 STRL LF DISP TIS (BLADE) IMPLANT
BLADE SURG 15 STRL SS (BLADE) ×2
BNDG COHESIVE 1X5 TAN STRL LF (GAUZE/BANDAGES/DRESSINGS) ×2 IMPLANT
BRUSH SCRUB EZ PLAIN DRY (MISCELLANEOUS) ×1 IMPLANT
GAUZE PACKING IODOFORM 1/4X5 (PACKING) ×1 IMPLANT
GAUZE SPONGE 4X4 12PLY STRL LF (GAUZE/BANDAGES/DRESSINGS) ×1 IMPLANT
GAUZE XEROFORM 1X8 LF (GAUZE/BANDAGES/DRESSINGS) ×2 IMPLANT
GLOVE BIO SURGEON STRL SZ 6.5 (GLOVE) ×1 IMPLANT
GLOVE BIO SURGEON STRL SZ7.5 (GLOVE) ×1 IMPLANT
GLOVE ECLIPSE 6.5 STRL STRAW (GLOVE) ×1 IMPLANT
NDL HYPO 25X1 1.5 SAFETY (NEEDLE) IMPLANT
NEEDLE HYPO 25X1 1.5 SAFETY (NEEDLE) ×2 IMPLANT
NS IRRIG 1000ML POUR BTL (IV SOLUTION) ×1 IMPLANT
PACK BASIN DAY SURGERY FS (CUSTOM PROCEDURE TRAY) ×1 IMPLANT
SWAB CULTURE LIQ STUART DBL (MISCELLANEOUS) ×1 IMPLANT
SYR BULB 3OZ (MISCELLANEOUS) ×1 IMPLANT
SYR CONTROL 10ML LL (SYRINGE) ×1 IMPLANT
TOWEL OR 17X24 6PK STRL BLUE (TOWEL DISPOSABLE) ×1 IMPLANT
TRAY DSU PREP LF (CUSTOM PROCEDURE TRAY) ×1 IMPLANT
TUBE ANAEROBIC SPECIMEN COL (MISCELLANEOUS) ×1 IMPLANT
UNDERPAD 30X30 INCONTINENT (UNDERPADS AND DIAPERS) ×1 IMPLANT

## 2012-08-16 NOTE — H&P (Signed)
  Chad Fuller is an 78 y.o. male.   Chief Complaint: right thumb infection HPI: 78 yo rhd male with 1.5 weeks erythema and swelling of right thumb.  Started when he was cleaning under nail with a knife.  Progressively worsened.  Painful.  No fevers, chills, night sweats.  Got antibiotic from pcp 1 week ago with no help.  Past Medical History  Diagnosis Date  . Diabetes mellitus   . Cancer     lung  . Prostate enlargement   . History of blood clots     Past Surgical History  Procedure Date  . Lung removal, partial   . Coronary stent placement   . Knee surgery   . Filtering procedure     reports filter placed after knee surgery for blood clots    No family history on file. Social History:  reports that he has quit smoking. His smoking use included Cigarettes. He does not have any smokeless tobacco history on file. He reports that he does not drink alcohol. His drug history not on file.  Allergies: No Known Allergies  Medications Prior to Admission  Medication Sig Dispense Refill  . ezetimibe (ZETIA) 10 MG tablet Take 10 mg by mouth daily.      . finasteride (PROSCAR) 5 MG tablet Take 5 mg by mouth daily.        Marland Kitchen glipiZIDE (GLUCOTROL XL) 10 MG 24 hr tablet Take 10 mg by mouth daily.        Marland Kitchen GLUCOSAMINE CHONDROITIN COMPLX PO Take 2 tablets by mouth daily.        . hydrochlorothiazide (HYDRODIURIL) 25 MG tablet Take 25 mg by mouth daily.        . insulin glargine (LANTUS) 100 UNIT/ML injection Inject 20-60 Units into the skin at bedtime. Dosed based on sugar levels:  60 units in morning; 20-40 units in evening       . Multiple Vitamins-Minerals (MULTIVITAMINS THER. W/MINERALS) TABS Take 1 tablet by mouth daily.        . pantoprazole (PROTONIX) 20 MG tablet Take 20 mg by mouth daily.        . Tetrahydrozoline HCl (VISINE OP) Place 1 drop into both eyes as needed. Itching          No results found for this or any previous visit (from the past 48 hour(s)).  No results  found.   A comprehensive review of systems was negative.  There were no vitals taken for this visit.  General appearance: alert, cooperative and appears stated age Head: Normocephalic, without obvious abnormality, atraumatic Neck: supple, symmetrical, trachea midline Resp: clear to auscultation bilaterally and decreased sounds on right. Cardio: regular rate and rhythm GI: soft, non-tender; bowel sounds normal; no masses,  no organomegaly Extremities: light touch sensation and capillary refill intact all digits. +epl/fpl/io.  right thumb with swelling and erythema on radia side of nail.  no proximal streaking.  no pain with motion of ip joint. Pulses: 2+ and symmetric Skin: as above Neurologic: Grossly normal Incision/Wound: As above  Assessment/Plan Right thumb paronychia.  Recommend I&D of right thumb.  Risks, benefits, and alternatives of surgery were discussed and the patient agrees with the plan of care.   Chad Fuller R 08/16/2012, 12:46 PM

## 2012-08-16 NOTE — Op Note (Signed)
Dictation [id number]

## 2012-08-17 ENCOUNTER — Encounter (HOSPITAL_BASED_OUTPATIENT_CLINIC_OR_DEPARTMENT_OTHER): Payer: Self-pay | Admitting: Orthopedic Surgery

## 2012-08-17 NOTE — Op Note (Signed)
NAME:  Chad, Fuller NO.:  [id number]  MEDICAL RECORD NO.:  [id number]  LOCATION:                                 FACILITY:  PHYSICIAN:  Betha Loa, MD             DATE OF BIRTH:  DATE OF PROCEDURE:  08/16/2012 DATE OF DISCHARGE:                              OPERATIVE REPORT   PREOPERATIVE DIAGNOSIS:  Right thumb paronychia.  POSTOPERATIVE DIAGNOSIS:  Right thumb paronychia.  PROCEDURE:  Irrigation and debridement of right thumb paronychia and removal of the nail plate.  SURGEON:  Betha Loa, M.D.  ASSISTANT:  None.  ANESTHESIA:  Digital block with 10 mL of half and half solution of 0.25% plain Marcaine and 1% plain Xylocaine.  IV FLUIDS:  None.  SPECIMENS:  Cultures to Microbiology.  TOURNIQUET TIME:  15 minutes with Penrose drain.  DISPOSITION:  Stable to PACU.  INDICATIONS:  Chad Fuller is an 78 year old right-hand-dominant male who has noted a week and a half of pain and swelling of the right thumb.  He has also noticed erythema and swelling of the paronychial tissues.  He was seen by his primary care physician and started on some antibiotics, but he states this did not help.  He returned today and was referred to me for further care.  He thinks that it may have started when he was cleaning under his nail with his pocket knife.  He is diabetic.  I recommended to Chad Fuller irrigation and debridement of the right thumb and removal of the nail plate.  Risks, benefits, alternatives of surgery were discussed including risk of blood loss; infection; damage to nerves, vessels, tendons, ligaments, bone; failure of surgery; need for additional surgery; complications with wound healing; continued pain; and continued infection.  He voiced understanding of these risks and elected to proceed.  OPERATIVE COURSE:  A digital block was performed preoperatively with 10 mL of half and half solution of 0.25% plain Marcaine and 1% plain Xylocaine.  This provided  total digital anesthesia.  He was transferred to the operating room and placed in the operating table in supine position with the right upper extremity on arm board.  The right thumb was prepped with Betadine.  It was then draped sterilely.  A Penrose drain was placed to the proximal aspect of the thumb as a tourniquet. The Bruner incision was made on the ulnar side of the thumb.  There was gross purulence.  This was cultured for aerobes, anaerobes, AFB, and fungus.  Some of the skin had been devitalized and this was debrided. The infection coursed around the edge of the nail.  The nail plate was removed.  There was some granulation tissue at the radial side of the nail plate.  This was removed as well.  Wounds were copiously irrigated with sterile saline.  A piece of iodoform packing was placed in the wound.  The Xeroform was placed in the nail fold and the wounds were then dressed with sterile Xeroform, 4x4s, and wrapped with a Kling and Coban lightly.  The Penrose drain was removed at approximately 15 minutes.  The operative drapes were broken down.  He  was taken to PACU in stable condition.  I will see him back in the office in 3-5 days for postprocedure followup.  I will give him Norco 5/325, 1-2 p.o. q.6 hours p.r.n. pain, dispensed #30.     Betha Loa, MD     KK/MEDQ  D:  08/16/2012  T:  08/17/2012  Job:  [id number]

## 2012-08-18 LAB — WOUND CULTURE

## 2012-08-19 DIAGNOSIS — B958 Unspecified staphylococcus as the cause of diseases classified elsewhere: Secondary | ICD-10-CM | POA: Diagnosis not present

## 2012-08-19 DIAGNOSIS — L02519 Cutaneous abscess of unspecified hand: Secondary | ICD-10-CM | POA: Diagnosis not present

## 2012-08-21 LAB — ANAEROBIC CULTURE

## 2012-09-12 LAB — FUNGUS CULTURE W SMEAR: Fungal Smear: NONE SEEN

## 2012-09-28 LAB — AFB CULTURE WITH SMEAR (NOT AT ARMC): Acid Fast Smear: NONE SEEN

## 2012-10-04 DIAGNOSIS — Z23 Encounter for immunization: Secondary | ICD-10-CM | POA: Diagnosis not present

## 2013-01-10 DIAGNOSIS — R05 Cough: Secondary | ICD-10-CM | POA: Diagnosis not present

## 2013-01-13 DIAGNOSIS — E782 Mixed hyperlipidemia: Secondary | ICD-10-CM | POA: Diagnosis not present

## 2013-01-13 DIAGNOSIS — E1149 Type 2 diabetes mellitus with other diabetic neurological complication: Secondary | ICD-10-CM | POA: Diagnosis not present

## 2013-01-13 DIAGNOSIS — Z79899 Other long term (current) drug therapy: Secondary | ICD-10-CM | POA: Diagnosis not present

## 2013-01-17 DIAGNOSIS — E1142 Type 2 diabetes mellitus with diabetic polyneuropathy: Secondary | ICD-10-CM | POA: Diagnosis not present

## 2013-01-17 DIAGNOSIS — E1149 Type 2 diabetes mellitus with other diabetic neurological complication: Secondary | ICD-10-CM | POA: Diagnosis not present

## 2013-01-17 DIAGNOSIS — I10 Essential (primary) hypertension: Secondary | ICD-10-CM | POA: Diagnosis not present

## 2013-01-18 DIAGNOSIS — E119 Type 2 diabetes mellitus without complications: Secondary | ICD-10-CM | POA: Diagnosis not present

## 2013-02-09 ENCOUNTER — Other Ambulatory Visit: Payer: Self-pay | Admitting: *Deleted

## 2013-02-09 ENCOUNTER — Other Ambulatory Visit: Payer: Medicare Other | Admitting: Lab

## 2013-02-09 ENCOUNTER — Ambulatory Visit: Payer: Medicare Other | Admitting: Internal Medicine

## 2013-02-09 ENCOUNTER — Telehealth: Payer: Self-pay | Admitting: *Deleted

## 2013-02-09 NOTE — Telephone Encounter (Signed)
Patient unable to make appointment due to weather, Please call to reschedule.

## 2013-02-10 ENCOUNTER — Telehealth: Payer: Self-pay | Admitting: Internal Medicine

## 2013-02-14 ENCOUNTER — Telehealth: Payer: Self-pay | Admitting: Internal Medicine

## 2013-02-15 ENCOUNTER — Ambulatory Visit: Payer: Medicare Other | Admitting: Internal Medicine

## 2013-02-15 ENCOUNTER — Other Ambulatory Visit: Payer: Medicare Other | Admitting: Lab

## 2013-02-21 ENCOUNTER — Other Ambulatory Visit: Payer: Self-pay | Admitting: Medical Oncology

## 2013-02-21 ENCOUNTER — Telehealth: Payer: Self-pay | Admitting: Internal Medicine

## 2013-02-21 DIAGNOSIS — C3490 Malignant neoplasm of unspecified part of unspecified bronchus or lung: Secondary | ICD-10-CM

## 2013-02-21 NOTE — Telephone Encounter (Signed)
, °

## 2013-02-22 ENCOUNTER — Ambulatory Visit (HOSPITAL_COMMUNITY)
Admission: RE | Admit: 2013-02-22 | Discharge: 2013-02-22 | Disposition: A | Discharge status: Home / Self Care | Payer: Medicare Other | Source: Ambulatory Visit | Attending: Internal Medicine | Admitting: Internal Medicine

## 2013-02-22 ENCOUNTER — Ambulatory Visit (HOSPITAL_BASED_OUTPATIENT_CLINIC_OR_DEPARTMENT_OTHER): Payer: Medicare Other | Admitting: Internal Medicine

## 2013-02-22 ENCOUNTER — Encounter: Payer: Self-pay | Admitting: Internal Medicine

## 2013-02-22 ENCOUNTER — Other Ambulatory Visit (HOSPITAL_BASED_OUTPATIENT_CLINIC_OR_DEPARTMENT_OTHER): Payer: Medicare Other | Admitting: Lab

## 2013-02-22 ENCOUNTER — Telehealth: Payer: Self-pay | Admitting: Internal Medicine

## 2013-02-22 VITALS — BP 161/79 | HR 76 | Temp 97.5°F | Resp 20 | Ht 73.0 in | Wt 237.5 lb

## 2013-02-22 DIAGNOSIS — J984 Other disorders of lung: Secondary | ICD-10-CM | POA: Insufficient documentation

## 2013-02-22 DIAGNOSIS — Z85118 Personal history of other malignant neoplasm of bronchus and lung: Secondary | ICD-10-CM

## 2013-02-22 DIAGNOSIS — C3490 Malignant neoplasm of unspecified part of unspecified bronchus or lung: Secondary | ICD-10-CM

## 2013-02-22 DIAGNOSIS — R0602 Shortness of breath: Secondary | ICD-10-CM | POA: Insufficient documentation

## 2013-02-22 LAB — CBC WITH DIFFERENTIAL/PLATELET
BASO%: 0.4 % (ref 0.0–2.0)
Basophils Absolute: 0 10*3/uL (ref 0.0–0.1)
HCT: 40.2 % (ref 38.4–49.9)
HGB: 14.1 g/dL (ref 13.0–17.1)
LYMPH%: 32.1 % (ref 14.0–49.0)
MCHC: 35.1 g/dL (ref 32.0–36.0)
MONO#: 0.5 10*3/uL (ref 0.1–0.9)
NEUT%: 55.9 % (ref 39.0–75.0)
Platelets: 228 10*3/uL (ref 140–400)
WBC: 5.5 10*3/uL (ref 4.0–10.3)
lymph#: 1.8 10*3/uL (ref 0.9–3.3)

## 2013-02-22 LAB — COMPREHENSIVE METABOLIC PANEL (CC13)
ALT: 22 U/L (ref 0–55)
BUN: 15 mg/dL (ref 7.0–26.0)
CO2: 30 mEq/L — ABNORMAL HIGH (ref 22–29)
Calcium: 9.1 mg/dL (ref 8.4–10.4)
Chloride: 101 mEq/L (ref 98–107)
Creatinine: 0.9 mg/dL (ref 0.7–1.3)
Glucose: 254 mg/dl — ABNORMAL HIGH (ref 70–99)
Total Bilirubin: 0.73 mg/dL (ref 0.20–1.20)

## 2013-02-22 NOTE — Progress Notes (Signed)
[facility] [phone]   [phone]  OFFICE PROGRESS NOTE  PRINCIPAL DIAGNOSIS: Stage IA (T1N0MX) non-small cell lung cancer, moderately differentiated squamous cell carcinoma diagnosed in October 2005.   PRIOR THERAPY: Status post right upper lobe wedge resection with lymph node dissection under the care of Dr Edwyna Shell on December 22, 2004.   CURRENT THERAPY: Observation.  INTERVAL HISTORY: Chad Fuller 79 y.o. male returns to the clinic today for annual followup visit. The patient is feeling fine today with no specific complaints except for shortness breath with exertion. He denied having any significant chest pain, cough or hemoptysis. He denied having any significant weight loss or night sweats. The patient had repeat labwork in addition to chest x-ray recently and he is here for evaluation and discussion of his lab and imaging results  MEDICAL HISTORY: Past Medical History  Diagnosis Date  . Diabetes mellitus   . Cancer     lung  . Prostate enlargement   . History of blood clots     ALLERGIES:  has No Known Allergies.  MEDICATIONS:  Current Outpatient Prescriptions  Medication Sig Dispense Refill  . ACCU-CHEK AVIVA PLUS test strip       . ezetimibe (ZETIA) 10 MG tablet Take 10 mg by mouth daily.      . finasteride (PROSCAR) 5 MG tablet Take 5 mg by mouth daily.        Marland Kitchen glipiZIDE (GLUCOTROL XL) 10 MG 24 hr tablet Take 10 mg by mouth daily.        Marland Kitchen GLUCOSAMINE CHONDROITIN COMPLX PO Take 2 tablets by mouth daily.        . hydrochlorothiazide (HYDRODIURIL) 25 MG tablet Take 25 mg by mouth daily.        . insulin glargine (LANTUS) 100 UNIT/ML injection Inject 20-60 Units into the skin at bedtime. Dosed based on sugar levels:  60 units in morning; 20-40 units in evening       . Multiple Vitamins-Minerals (MULTIVITAMINS THER. W/MINERALS) TABS Take 1 tablet by mouth daily.        . pantoprazole (PROTONIX) 20 MG tablet Take 20 mg by mouth  daily.        . Tetrahydrozoline HCl (VISINE OP) Place 1 drop into both eyes as needed. Itching         No current facility-administered medications for this visit.    SURGICAL HISTORY:  Past Surgical History  Procedure Laterality Date  . Lung removal, partial    . Coronary stent placement    . Knee surgery    . Filtering procedure      reports filter placed after knee surgery for blood clots  . I&d extremity  08/16/2012    Procedure: MINOR IRRIGATION AND DEBRIDEMENT EXTREMITY;  Surgeon: Tami Ribas, MD;  Location: [facility];  Service: Orthopedics;  Laterality: Right;    REVIEW OF SYSTEMS:  A comprehensive review of systems was negative except for: Respiratory: positive for dyspnea on exertion   PHYSICAL EXAMINATION: General appearance: alert, cooperative and no distress Head: Normocephalic, without obvious abnormality, atraumatic Neck: no adenopathy Lymph nodes: Cervical, supraclavicular, and axillary nodes normal. Resp: clear to auscultation bilaterally Cardio: regular rate and rhythm, S1, S2 normal, no murmur, click, rub or gallop GI: soft, non-tender; bowel sounds normal; no masses,  no organomegaly Extremities: extremities normal, atraumatic, no cyanosis or edema  ECOG PERFORMANCE STATUS: 1 - Symptomatic but completely ambulatory  Blood pressure 161/79, pulse 76,  temperature 97.5 F (36.4 C), temperature source Oral, resp. rate 20, height 6\' 1"  (1.854 m), weight 237 lb 8 oz (107.729 kg).  LABORATORY DATA: Lab Results  Component Value Date   WBC 5.5 02/22/2013   HGB 14.1 02/22/2013   HCT 40.2 02/22/2013   MCV 91.7 02/22/2013   PLT 228 02/22/2013      Chemistry      Component Value Date/Time   NA 140 02/22/2013 1415   NA 137 01/21/2012 1312   K 3.9 02/22/2013 1415   K 4.2 01/21/2012 1312   CL 101 02/22/2013 1415   CL 100 01/21/2012 1312   CO2 30* 02/22/2013 1415   CO2 27 01/21/2012 1312   BUN 15.0 02/22/2013 1415   BUN 14 01/21/2012 1312   CREATININE 0.9  02/22/2013 1415   CREATININE 0.87 01/21/2012 1312      Component Value Date/Time   CALCIUM 9.1 02/22/2013 1415   CALCIUM 9.0 01/21/2012 1312   ALKPHOS 112 02/22/2013 1415   ALKPHOS 132* 01/21/2012 1312   AST 27 02/22/2013 1415   AST 27 01/21/2012 1312   ALT 22 02/22/2013 1415   ALT 20 01/21/2012 1312   BILITOT 0.73 02/22/2013 1415   BILITOT 0.5 01/21/2012 1312       RADIOGRAPHIC STUDIES: Dg Chest 2 View  02/22/2013  *RADIOLOGY REPORT*  Clinical Data: Shortness of breath.  Lung cancer.  CHEST - 2 VIEW  Comparison: 01/10/2013  Findings: Heart is normal size.  Slight tortuosity of the thoracic aorta, stable.  Stable postoperative changes in the right lung. Scarring in the right upper lobe.  Left lung is clear.  No acute opacities or effusions.  No acute bony abnormality.  IMPRESSION: Stable exam.  No acute findings.   Original Report Authenticated By: Charlett Nose, M.D.     ASSESSMENT: This is a very pleasant 79 years old white male with stage IA non-small cell lung cancer status post wedge resection of the right upper lobe performed in December of 2005 and the patient has been observation since that time with no evidence for disease recurrence.  PLAN: I discussed the imaging results with the patient today. I recommended for him to continue on observation with repeat CBC, comprehensive metabolic panel and chest x-ray in one year.  He was advised to call me immediately if he has any concerning symptoms in the interval.   All questions were answered. The patient knows to call the clinic with any problems, questions or concerns. We can certainly see the patient much sooner if necessary.

## 2013-02-22 NOTE — Patient Instructions (Signed)
No evidence for disease recurrence. Followup in 1 year

## 2013-03-10 DIAGNOSIS — E1149 Type 2 diabetes mellitus with other diabetic neurological complication: Secondary | ICD-10-CM | POA: Diagnosis not present

## 2013-03-10 DIAGNOSIS — E1142 Type 2 diabetes mellitus with diabetic polyneuropathy: Secondary | ICD-10-CM | POA: Diagnosis not present

## 2013-04-04 DIAGNOSIS — H9190 Unspecified hearing loss, unspecified ear: Secondary | ICD-10-CM | POA: Diagnosis not present

## 2013-05-08 DIAGNOSIS — N320 Bladder-neck obstruction: Secondary | ICD-10-CM | POA: Diagnosis not present

## 2013-05-08 DIAGNOSIS — N529 Male erectile dysfunction, unspecified: Secondary | ICD-10-CM | POA: Diagnosis not present

## 2013-05-08 DIAGNOSIS — N476 Balanoposthitis: Secondary | ICD-10-CM | POA: Diagnosis not present

## 2013-05-08 DIAGNOSIS — N400 Enlarged prostate without lower urinary tract symptoms: Secondary | ICD-10-CM | POA: Diagnosis not present

## 2013-05-08 DIAGNOSIS — N4340 Spermatocele of epididymis, unspecified: Secondary | ICD-10-CM | POA: Diagnosis not present

## 2013-05-09 DIAGNOSIS — E1142 Type 2 diabetes mellitus with diabetic polyneuropathy: Secondary | ICD-10-CM | POA: Diagnosis not present

## 2013-05-09 DIAGNOSIS — E1149 Type 2 diabetes mellitus with other diabetic neurological complication: Secondary | ICD-10-CM | POA: Diagnosis not present

## 2013-05-09 DIAGNOSIS — Z79899 Other long term (current) drug therapy: Secondary | ICD-10-CM | POA: Diagnosis not present

## 2013-05-09 DIAGNOSIS — I10 Essential (primary) hypertension: Secondary | ICD-10-CM | POA: Diagnosis not present

## 2013-05-09 DIAGNOSIS — R5383 Other fatigue: Secondary | ICD-10-CM | POA: Diagnosis not present

## 2013-05-28 DIAGNOSIS — G510 Bell's palsy: Secondary | ICD-10-CM

## 2013-05-28 HISTORY — DX: Bell's palsy: G51.0

## 2013-05-29 DIAGNOSIS — D235 Other benign neoplasm of skin of trunk: Secondary | ICD-10-CM | POA: Diagnosis not present

## 2013-05-29 DIAGNOSIS — D485 Neoplasm of uncertain behavior of skin: Secondary | ICD-10-CM | POA: Diagnosis not present

## 2013-06-01 DIAGNOSIS — G510 Bell's palsy: Secondary | ICD-10-CM | POA: Diagnosis not present

## 2013-06-15 DIAGNOSIS — G510 Bell's palsy: Secondary | ICD-10-CM | POA: Diagnosis not present

## 2013-06-28 DIAGNOSIS — H1030 Unspecified acute conjunctivitis, unspecified eye: Secondary | ICD-10-CM | POA: Diagnosis not present

## 2013-07-05 ENCOUNTER — Other Ambulatory Visit (HOSPITAL_COMMUNITY): Payer: Self-pay | Admitting: Geriatric Medicine

## 2013-07-05 DIAGNOSIS — M7989 Other specified soft tissue disorders: Secondary | ICD-10-CM

## 2013-07-05 DIAGNOSIS — M25562 Pain in left knee: Secondary | ICD-10-CM

## 2013-07-05 DIAGNOSIS — R609 Edema, unspecified: Secondary | ICD-10-CM | POA: Diagnosis not present

## 2013-07-06 ENCOUNTER — Ambulatory Visit (HOSPITAL_COMMUNITY)
Admission: RE | Admit: 2013-07-06 | Discharge: 2013-07-06 | Disposition: A | Discharge status: Home / Self Care | Payer: Medicare Other | Source: Ambulatory Visit | Attending: Geriatric Medicine | Admitting: Geriatric Medicine

## 2013-07-06 DIAGNOSIS — M25562 Pain in left knee: Secondary | ICD-10-CM

## 2013-07-06 DIAGNOSIS — M7989 Other specified soft tissue disorders: Secondary | ICD-10-CM | POA: Insufficient documentation

## 2013-07-06 NOTE — Progress Notes (Signed)
VASCULAR LAB PRELIMINARY  PRELIMINARY  PRELIMINARY  PRELIMINARY  Left lower extremity venous duplex completed.    Preliminary report:  Left:  No evidence of DVT, superficial thrombosis, or Baker's cyst.  Ailyne Pawley, RVT 07/06/2013, 9:47 AM

## 2013-07-15 DIAGNOSIS — H1045 Other chronic allergic conjunctivitis: Secondary | ICD-10-CM | POA: Diagnosis not present

## 2013-09-05 DIAGNOSIS — E1149 Type 2 diabetes mellitus with other diabetic neurological complication: Secondary | ICD-10-CM | POA: Diagnosis not present

## 2013-09-05 DIAGNOSIS — E1142 Type 2 diabetes mellitus with diabetic polyneuropathy: Secondary | ICD-10-CM | POA: Diagnosis not present

## 2013-09-05 DIAGNOSIS — Z79899 Other long term (current) drug therapy: Secondary | ICD-10-CM | POA: Diagnosis not present

## 2013-09-05 DIAGNOSIS — I10 Essential (primary) hypertension: Secondary | ICD-10-CM | POA: Diagnosis not present

## 2013-09-06 DIAGNOSIS — E1149 Type 2 diabetes mellitus with other diabetic neurological complication: Secondary | ICD-10-CM | POA: Diagnosis not present

## 2013-09-06 DIAGNOSIS — Z79899 Other long term (current) drug therapy: Secondary | ICD-10-CM | POA: Diagnosis not present

## 2013-09-06 DIAGNOSIS — I10 Essential (primary) hypertension: Secondary | ICD-10-CM | POA: Diagnosis not present

## 2013-09-13 DIAGNOSIS — E119 Type 2 diabetes mellitus without complications: Secondary | ICD-10-CM | POA: Diagnosis not present

## 2013-09-29 ENCOUNTER — Ambulatory Visit (INDEPENDENT_AMBULATORY_CARE_PROVIDER_SITE_OTHER): Payer: Medicare Other | Admitting: Podiatrist

## 2013-09-29 ENCOUNTER — Ambulatory Visit (INDEPENDENT_AMBULATORY_CARE_PROVIDER_SITE_OTHER): Payer: Medicare Other

## 2013-09-29 ENCOUNTER — Encounter: Payer: Self-pay | Admitting: Podiatrist

## 2013-09-29 VITALS — BP 140/79 | HR 78 | Resp 16

## 2013-09-29 DIAGNOSIS — M7750 Other enthesopathy of unspecified foot: Secondary | ICD-10-CM

## 2013-09-29 DIAGNOSIS — M7752 Other enthesopathy of left foot: Secondary | ICD-10-CM

## 2013-09-29 DIAGNOSIS — B351 Tinea unguium: Secondary | ICD-10-CM | POA: Diagnosis not present

## 2013-09-29 DIAGNOSIS — R609 Edema, unspecified: Secondary | ICD-10-CM | POA: Diagnosis not present

## 2013-09-29 MED ORDER — TRIAMCINOLONE ACETONIDE 10 MG/ML IJ SUSP: 10.0000 mg | Freq: Once | INTRAMUSCULAR | Status: DC

## 2013-09-29 NOTE — Patient Instructions (Signed)

## 2013-09-29 NOTE — Progress Notes (Deleted)
Pain sinus tarsi left.  Left great toenail is thick Subjective:    Patient ID: Chad Fuller, male    DOB: [birth year], 79 y.o.   MRN: [id number]  Foot Pain This is a new problem. The current episode started more than 1 month ago. The problem occurs daily. The problem has been gradually worsening. Associated symptoms include joint swelling. The symptoms are aggravated by walking. He has tried nothing for the symptoms. The treatment provided no relief.      Review of Systems  Musculoskeletal: Positive for joint swelling.       Objective:   Physical Exam        Assessment & Plan:

## 2013-09-29 NOTE — Progress Notes (Signed)
Chief Complaint  Patient presents with  . Foot Pain    my ankle swells with fluid, walking alot makes it worse.  Lewie Chamber Toe    my left big toe hurts on the side, rubs the shoe, so I wear sandals.     HPI: Patient is 79 y.o. male who presents today for left ankle pain patient states that the ankle swells and that walking makes it uncomfortable. Patient has tried no treatment he states he recently had knee surgery NA he noticed the pain after the knee surgery. Patient also relates he fell asleep on his bed funny and he woke up by falling on the ground and he feels he may have injured his ankle at that point.  Review of Systems  DATA OBTAINED: from patient, GENERAL: Feels well no fevers, no fatigue, no changes in appetite SKIN: No itching, no rashes, no open lesions, no wounds EYES: No eye pain,no redness, no discharge EARS: No earache,no ringing of ears, no recent change in hearing NOSE: No congestion, no drainage, no bleeding  MOUTH/THROAT: No mouth pain, No sore throat, No difficulty chewing or swallowing  RESPIRATORY: No cough, no wheezing, no SOB CARDIAC: No chest pain,no heart palpitations,no new onset lower extremity edema  GI: No abdominal pain, No Nausea, no vomiting, no diarrhea, no heartburn or no reflux  GU: No dysuria, no increased frequency or urgency MUSCULOSKELETAL: problems after knee surgery- swells NEUROLOGIC: Awake, alert, appropriate to situation, No change in mental status. PSYCHIATRIC: No overt anxiety or sadness.No behavior issue.  AMBULATION:  Ambulates with a cane   Physical Exam  GENERAL APPEARANCE: Alert, conversant. Appropriately groomed. No acute distress.  VASCULAR: Pedal pulses palpable and strong bilateral.  Capillary refill time is immediate to all digits,  Proximal to distal cooling it warm to warm.  Digital hair growth is present bilateral  NEUROLOGIC: sensation is intact epicritically and protectively to 5.07 monofilament at 5/5 sites bilateral.   Light touch is intact bilateral, vibratory sensation intact bilateral, achilles tendon reflex is intact bilateral.  MUSCULOSKELETAL: acceptable muscle strength, tone and stability bilateral.  Intrinsic muscluature intact bilateral.  Pain along the sinus tarsi region of the left ankle is noted. Valgus tilt of the left ankle compared to the right is also seen. DERMATOLOGIC: skin color, texture, and turger are within normal limits.  No preulcerative lesions are seen, no interdigital maceration noted.  No open lesions present.  Digital nails are asymptomatic With the exception of the left hallux nail which appears to be yellowish brown discoloration with mycotic appearance present.  Patient Active Problem List   Diagnosis Date Noted  . ASTHMA 02/23/2009  . CORONARY HEART DISEASE 02/05/2009  . NEOPLASM, MALIGNANT, LUNG 02/04/2009  . DIABETES, TYPE 2 02/04/2009  . PULMONARY EMBOLISM 02/04/2009  . C O P D 02/04/2009  . DYSPNEA 02/04/2009    Assessment   Capsulitis of the sinus tarsi left  Plan  Orders Placed This Encounter  Procedures  . DG Ankle Complete Left    Standing Status: Future     Number of Occurrences: 1     Standing Expiration Date: 11/29/2014    Order Specific Question:  Reason for Exam (SYMPTOM  OR DIAGNOSIS REQUIRED)    Answer:  swelling    Order Specific Question:  Preferred imaging location?    Answer:  Internal    Injection of the sinus tarsi left was carried out at today's visit. surgigrip was dispensed as well.  Patient given instructions for aftercare and will  be seen prn  EGERTON, KATHRYN P, DPM

## 2013-10-10 ENCOUNTER — Ambulatory Visit (INDEPENDENT_AMBULATORY_CARE_PROVIDER_SITE_OTHER): Payer: Medicare Other | Admitting: Podiatry

## 2013-10-10 ENCOUNTER — Ambulatory Visit (INDEPENDENT_AMBULATORY_CARE_PROVIDER_SITE_OTHER): Payer: Medicare Other

## 2013-10-10 ENCOUNTER — Encounter: Payer: Self-pay | Admitting: Podiatry

## 2013-10-10 DIAGNOSIS — S92309A Fracture of unspecified metatarsal bone(s), unspecified foot, initial encounter for closed fracture: Secondary | ICD-10-CM | POA: Diagnosis not present

## 2013-10-10 DIAGNOSIS — M79672 Pain in left foot: Secondary | ICD-10-CM

## 2013-10-10 DIAGNOSIS — M79609 Pain in unspecified limb: Secondary | ICD-10-CM | POA: Diagnosis not present

## 2013-10-10 NOTE — Progress Notes (Signed)
  Subjective:    Patient ID: Chad Fuller, male    DOB: [birth year], 79 y.o.   MRN: [id number]  HPIfollow up continued pain and swelling from 09/29/13 visit    Review of Systems     Objective:   Physical Exam        Assessment & Plan:

## 2013-10-11 NOTE — Progress Notes (Signed)
Mr. Yost presents today complaining of swelling in his left foot. He was here a few weeks ago and saw Dr. Donzetta Kohut who injected his sinus tarsi of his left foot. He still complained about swelling. States that by the end of the day his foot is swollen and tight and he can hardly walk on it. Relates the swelling is down in the morning before he gets out of bed. States that this all started when he fell out of bed and hurt his left foot. This is done 3 months ago.  Objective: Vital signs are stable he is alert and oriented x3. Strong palpable pulses to the left lower extremity present. He does have some pitting edema over the fourth fifth metatarsal mid diaphyseal and proximal diaphyseal region. Radiographs of the left foot today demonstrate what appears to be a cortical fracture that has healed at this point fifth metatarsal left. I explained to him that this could cause some of the swelling at this should be going down at this point since the fracture has healed. Evaluating his left lower extremity further, I believe much of his swelling is due to venous insufficiency and this would fit much better with his symptomatology that he relates.  Assessment: Healed fracture fifth midleft.  #2. Venous insufficiency left.  #3. osteoarthritis subtalar joint and ankle left.  Plan: He seemed much relieved just noted he had had a fracture there one time and that this could cause some of the symptoms that he was noting. A placed in a compression anklet today this should help relieve much of the swelling on end of the day. I will followup with him on an as-needed basis.

## 2013-11-14 ENCOUNTER — Encounter: Payer: Self-pay | Admitting: Podiatry

## 2013-11-14 ENCOUNTER — Ambulatory Visit: Payer: Self-pay

## 2013-11-14 ENCOUNTER — Ambulatory Visit (INDEPENDENT_AMBULATORY_CARE_PROVIDER_SITE_OTHER): Payer: Medicare Other

## 2013-11-14 ENCOUNTER — Ambulatory Visit: Payer: Medicare Other | Admitting: Podiatry

## 2013-11-14 VITALS — BP 121/68 | HR 84 | Resp 12

## 2013-11-14 DIAGNOSIS — E1149 Type 2 diabetes mellitus with other diabetic neurological complication: Secondary | ICD-10-CM

## 2013-11-14 DIAGNOSIS — S92309A Fracture of unspecified metatarsal bone(s), unspecified foot, initial encounter for closed fracture: Secondary | ICD-10-CM

## 2013-11-14 DIAGNOSIS — R52 Pain, unspecified: Secondary | ICD-10-CM

## 2013-11-14 NOTE — Progress Notes (Signed)
Chad Fuller presents today for followup of fracture fifth metatarsal the left foot. He states it feels much better than it did previously. He states that the swelling has gone down considerably appear his concerned about wearing his compression anklet because he thinks it may cut off his circulation. He is stating that he is some numbness in his forefoot left. Left over right. States that the numbness is present constantly.  Objective: Pulses strongly palpable left foot. Minimal edema left foot. Radiographically evaluation demonstrates well-healed fracture fifth met left. Normal neurologic sensorium per Semmes-Weinstein monofilament left foot.  Assessment: Well-healed fifth met. Diabetic neuropathy.  Plan: Dispensed Neuremedy. Debrided nail fifth digit left. Followup with him in 3 months for re\re debridement.

## 2013-12-04 ENCOUNTER — Ambulatory Visit: Payer: Self-pay

## 2013-12-05 ENCOUNTER — Ambulatory Visit (INDEPENDENT_AMBULATORY_CARE_PROVIDER_SITE_OTHER): Payer: Self-pay | Admitting: Family Medicine

## 2013-12-05 VITALS — BP 150/78 | HR 87 | Temp 97.6°F | Resp 20 | Ht 72.0 in | Wt 230.0 lb

## 2013-12-05 DIAGNOSIS — Z0289 Encounter for other administrative examinations: Secondary | ICD-10-CM

## 2013-12-05 NOTE — Progress Notes (Signed)
Patient ID: Chad Fuller, male   DOB: Dec 01, 1926, 79 y.o.   MRN: [id number] This chart was scribed for Sherren Mocha, MD by Valera Castle, ED Scribe. This patient was seen in room 13 and the patient's care was started at 3:18 PM.  HPI Chad Fuller is a 79 y.o. male who presents to the [facility] requesting a DOT exam.   Instructed pt that [facility] cannot authorize any type of DOT card due to him taking Lantus for his DM. Explained to pt that he is ineligible for any DOT card as long as he is on insulin. Pt was compliant but understandably upset and distressed.   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  Pt states he will contact his PCP - Dr. Pete Glatter.  He was also encouraged to contact the office in [place] that has been issuing his DOT cards in prior years.  PCP - Ginette Otto, MD  OV waived.

## 2013-12-06 ENCOUNTER — Telehealth: Payer: Self-pay

## 2013-12-06 NOTE — Telephone Encounter (Signed)
Spoke with patient and he is going to see his primary doctor tomorrow.  He would like for you to hold him forms until he sees his pd to possibly get off of insulin.

## 2013-12-06 NOTE — Telephone Encounter (Signed)
Dr.Shaw, Pt would like to speak with you regarding his diabetes. Best# [phone]

## 2013-12-06 NOTE — Telephone Encounter (Signed)
Sorry, i think forms have already been discarded.  He will have to complete a new DOT form when he comes in but would have to anyway since his medications will have changed.  He needs to be able to keep his hgba1c <8 off of insulin and we will need to have documentation from his PCP with his recent hgba1c and his medication changes.  We will also LIKELY only be able to issue him a shorter card - like 3 or 6 months - for at least his initial period off of insulin though this will be up to the doctor who sees him.  I am happy to see the pt when he returns for his DOT but did not complete any part of his exam (and he was not charged) so he can see any doctor here when he comes back in for his DOT.

## 2013-12-07 DIAGNOSIS — E119 Type 2 diabetes mellitus without complications: Secondary | ICD-10-CM | POA: Diagnosis not present

## 2013-12-07 NOTE — Telephone Encounter (Signed)
I have called him to advise  

## 2013-12-11 DIAGNOSIS — N320 Bladder-neck obstruction: Secondary | ICD-10-CM | POA: Diagnosis not present

## 2013-12-11 DIAGNOSIS — N509 Disorder of male genital organs, unspecified: Secondary | ICD-10-CM | POA: Diagnosis not present

## 2013-12-11 DIAGNOSIS — N400 Enlarged prostate without lower urinary tract symptoms: Secondary | ICD-10-CM | POA: Diagnosis not present

## 2013-12-11 DIAGNOSIS — N4340 Spermatocele of epididymis, unspecified: Secondary | ICD-10-CM | POA: Diagnosis not present

## 2013-12-12 DIAGNOSIS — N509 Disorder of male genital organs, unspecified: Secondary | ICD-10-CM | POA: Diagnosis not present

## 2013-12-12 DIAGNOSIS — N4340 Spermatocele of epididymis, unspecified: Secondary | ICD-10-CM | POA: Diagnosis not present

## 2013-12-12 DIAGNOSIS — N529 Male erectile dysfunction, unspecified: Secondary | ICD-10-CM | POA: Diagnosis not present

## 2013-12-12 DIAGNOSIS — N400 Enlarged prostate without lower urinary tract symptoms: Secondary | ICD-10-CM | POA: Diagnosis not present

## 2013-12-27 DIAGNOSIS — E1149 Type 2 diabetes mellitus with other diabetic neurological complication: Secondary | ICD-10-CM | POA: Diagnosis not present

## 2013-12-27 DIAGNOSIS — E1142 Type 2 diabetes mellitus with diabetic polyneuropathy: Secondary | ICD-10-CM | POA: Diagnosis not present

## 2014-01-05 DIAGNOSIS — R069 Unspecified abnormalities of breathing: Secondary | ICD-10-CM | POA: Diagnosis not present

## 2014-01-05 DIAGNOSIS — J069 Acute upper respiratory infection, unspecified: Secondary | ICD-10-CM | POA: Diagnosis not present

## 2014-01-10 DIAGNOSIS — Z79899 Other long term (current) drug therapy: Secondary | ICD-10-CM | POA: Diagnosis not present

## 2014-01-10 DIAGNOSIS — R5383 Other fatigue: Secondary | ICD-10-CM | POA: Diagnosis not present

## 2014-01-10 DIAGNOSIS — R630 Anorexia: Secondary | ICD-10-CM | POA: Diagnosis not present

## 2014-01-10 DIAGNOSIS — R05 Cough: Secondary | ICD-10-CM | POA: Diagnosis not present

## 2014-01-10 DIAGNOSIS — R5381 Other malaise: Secondary | ICD-10-CM | POA: Diagnosis not present

## 2014-01-10 DIAGNOSIS — R059 Cough, unspecified: Secondary | ICD-10-CM | POA: Diagnosis not present

## 2014-01-18 ENCOUNTER — Encounter: Payer: Self-pay | Admitting: Family Medicine

## 2014-01-18 DIAGNOSIS — E1149 Type 2 diabetes mellitus with other diabetic neurological complication: Secondary | ICD-10-CM

## 2014-01-19 ENCOUNTER — Encounter: Payer: Self-pay | Admitting: Family Medicine

## 2014-01-19 DIAGNOSIS — E119 Type 2 diabetes mellitus without complications: Secondary | ICD-10-CM

## 2014-01-19 NOTE — Assessment & Plan Note (Signed)
Visi

## 2014-01-24 DIAGNOSIS — E1149 Type 2 diabetes mellitus with other diabetic neurological complication: Secondary | ICD-10-CM | POA: Diagnosis not present

## 2014-01-24 DIAGNOSIS — E1142 Type 2 diabetes mellitus with diabetic polyneuropathy: Secondary | ICD-10-CM | POA: Diagnosis not present

## 2014-01-24 DIAGNOSIS — E663 Overweight: Secondary | ICD-10-CM | POA: Diagnosis not present

## 2014-02-13 ENCOUNTER — Ambulatory Visit: Payer: Medicare Other | Admitting: Podiatry

## 2014-02-21 ENCOUNTER — Ambulatory Visit (HOSPITAL_BASED_OUTPATIENT_CLINIC_OR_DEPARTMENT_OTHER): Payer: Medicare Other | Admitting: Internal Medicine

## 2014-02-21 ENCOUNTER — Encounter: Payer: Self-pay | Admitting: Internal Medicine

## 2014-02-21 ENCOUNTER — Telehealth: Payer: Self-pay | Admitting: *Deleted

## 2014-02-21 ENCOUNTER — Other Ambulatory Visit (HOSPITAL_BASED_OUTPATIENT_CLINIC_OR_DEPARTMENT_OTHER): Payer: Medicare Other

## 2014-02-21 ENCOUNTER — Ambulatory Visit (HOSPITAL_COMMUNITY)
Admission: RE | Admit: 2014-02-21 | Discharge: 2014-02-21 | Disposition: A | Discharge status: Home / Self Care | Payer: Medicare Other | Source: Ambulatory Visit | Attending: Internal Medicine | Admitting: Internal Medicine

## 2014-02-21 VITALS — BP 144/80 | HR 100 | Temp 97.5°F | Resp 18 | Ht 72.0 in | Wt 219.7 lb

## 2014-02-21 DIAGNOSIS — J984 Other disorders of lung: Secondary | ICD-10-CM | POA: Insufficient documentation

## 2014-02-21 DIAGNOSIS — Z85118 Personal history of other malignant neoplasm of bronchus and lung: Secondary | ICD-10-CM

## 2014-02-21 DIAGNOSIS — Z9889 Other specified postprocedural states: Secondary | ICD-10-CM | POA: Diagnosis not present

## 2014-02-21 DIAGNOSIS — C3490 Malignant neoplasm of unspecified part of unspecified bronchus or lung: Secondary | ICD-10-CM | POA: Insufficient documentation

## 2014-02-21 LAB — CBC WITH DIFFERENTIAL/PLATELET
BASO%: 0.3 % (ref 0.0–2.0)
BASOS ABS: 0 10*3/uL (ref 0.0–0.1)
EOS%: 2.2 % (ref 0.0–7.0)
Eosinophils Absolute: 0.2 10*3/uL (ref 0.0–0.5)
HCT: 44.1 % (ref 38.4–49.9)
HGB: 15.1 g/dL (ref 13.0–17.1)
LYMPH%: 24.4 % (ref 14.0–49.0)
MCH: 31.6 pg (ref 27.2–33.4)
MCHC: 34.2 g/dL (ref 32.0–36.0)
MCV: 92.5 fL (ref 79.3–98.0)
MONO#: 0.5 10*3/uL (ref 0.1–0.9)
MONO%: 6.6 % (ref 0.0–14.0)
NEUT#: 5.2 10*3/uL (ref 1.5–6.5)
NEUT%: 66.5 % (ref 39.0–75.0)
PLATELETS: 280 10*3/uL (ref 140–400)
RBC: 4.77 10*6/uL (ref 4.20–5.82)
RDW: 13.4 % (ref 11.0–14.6)
WBC: 7.8 10*3/uL (ref 4.0–10.3)
lymph#: 1.9 10*3/uL (ref 0.9–3.3)

## 2014-02-21 LAB — COMPREHENSIVE METABOLIC PANEL (CC13)
ALBUMIN: 3.7 g/dL (ref 3.5–5.0)
ALK PHOS: 93 U/L (ref 40–150)
ALT: 17 U/L (ref 0–55)
AST: 22 U/L (ref 5–34)
Anion Gap: 9 mEq/L (ref 3–11)
BILIRUBIN TOTAL: 0.79 mg/dL (ref 0.20–1.20)
BUN: 13.8 mg/dL (ref 7.0–26.0)
CO2: 31 mEq/L — ABNORMAL HIGH (ref 22–29)
CREATININE: 0.8 mg/dL (ref 0.7–1.3)
Calcium: 9.5 mg/dL (ref 8.4–10.4)
Chloride: 102 mEq/L (ref 98–109)
Glucose: 159 mg/dl — ABNORMAL HIGH (ref 70–140)
POTASSIUM: 4.3 meq/L (ref 3.5–5.1)
Sodium: 142 mEq/L (ref 136–145)
Total Protein: 6.8 g/dL (ref 6.4–8.3)

## 2014-02-21 NOTE — Telephone Encounter (Signed)
appts made and printed. Pt is aware of his cxr today and to get another one on his ov 02/21/15...td

## 2014-02-21 NOTE — Progress Notes (Signed)
[facility] [phone]   [phone]  OFFICE PROGRESS NOTE  PRINCIPAL DIAGNOSIS: Stage IA (T1N0MX) non-small cell lung cancer, moderately differentiated squamous cell carcinoma diagnosed in October 2005.   PRIOR THERAPY: Status post right upper lobe wedge resection with lymph node dissection under the care of Dr Arlyce Dice on December 22, 2004.   CURRENT THERAPY: Observation.  INTERVAL HISTORY: Chad Fuller 80 y.o. male returns to the clinic today for annual followup visit. The patient is feeling fine today with no specific complaints except for shortness of breath with exertion. He denied having any significant chest pain, cough or hemoptysis. He denied having any significant weight loss or night sweats. The patient had repeat labwork recently and he is here for evaluation and discussion of his lab results.   MEDICAL HISTORY: Past Medical History  Diagnosis Date  . Diabetes mellitus   . Cancer     lung  . Prostate enlargement   . History of blood clots   . CAD (coronary artery disease)   . Pulmonary embolus july 2005    ALLERGIES:  has No Known Allergies.  MEDICATIONS:  Current Outpatient Prescriptions  Medication Sig Dispense Refill  . ACCU-CHEK AVIVA PLUS test strip       . ezetimibe (ZETIA) 10 MG tablet Take 10 mg by mouth daily.      . finasteride (PROSCAR) 5 MG tablet Take 5 mg by mouth daily.        Marland Kitchen glipiZIDE (GLUCOTROL XL) 10 MG 24 hr tablet Take 10 mg by mouth daily.        Marland Kitchen GLUCOSAMINE CHONDROITIN COMPLX PO Take 2 tablets by mouth daily.        . hydrochlorothiazide (HYDRODIURIL) 25 MG tablet Take 25 mg by mouth daily.        . insulin glargine (LANTUS) 100 UNIT/ML injection Inject 20-60 Units into the skin at bedtime. Dosed based on sugar levels:  50 in the morning      . Multiple Vitamins-Minerals (MULTIVITAMINS THER. W/MINERALS) TABS Take 1 tablet by mouth daily.        . pantoprazole (PROTONIX) 20 MG tablet Take 20 mg by  mouth daily.        . tamsulosin (FLOMAX) 0.4 MG CAPS capsule        Current Facility-Administered Medications  Medication Dose Route Frequency Provider Last Rate Last Dose  . triamcinolone acetonide (KENALOG) 10 MG/ML injection 10 mg  10 mg Intra-articular Once Trudie Buckler, DPM        SURGICAL HISTORY:  Past Surgical History  Procedure Laterality Date  . Lung removal, partial    . Coronary stent placement    . Knee surgery    . Filtering procedure      reports filter placed after knee surgery for blood clots  . I&d extremity  08/16/2012    Procedure: MINOR IRRIGATION AND DEBRIDEMENT EXTREMITY;  Surgeon: Tennis Must, MD;  Location: [place];  Service: Orthopedics;  Laterality: Right;    REVIEW OF SYSTEMS:  A comprehensive review of systems was negative except for: Respiratory: positive for dyspnea on exertion   PHYSICAL EXAMINATION: General appearance: alert, cooperative and no distress Head: Normocephalic, without obvious abnormality, atraumatic Neck: no adenopathy Lymph nodes: Cervical, supraclavicular, and axillary nodes normal. Resp: clear to auscultation bilaterally Cardio: regular rate and rhythm, S1, S2 normal, no murmur, click, rub or gallop GI: soft, non-tender; bowel sounds normal; no masses,  no organomegaly Extremities:  extremities normal, atraumatic, no cyanosis or edema  ECOG PERFORMANCE STATUS: 1 - Symptomatic but completely ambulatory  Blood pressure 144/80, pulse 100, temperature 97.5 F (36.4 C), temperature source Oral, resp. rate 18, height 6' (1.829 m), weight 219 lb 11.2 oz (99.655 kg).  LABORATORY DATA: Lab Results  Component Value Date   WBC 7.8 02/21/2014   HGB 15.1 02/21/2014   HCT 44.1 02/21/2014   MCV 92.5 02/21/2014   PLT 280 02/21/2014      Chemistry      Component Value Date/Time   NA 142 02/21/2014 0827   NA 137 01/21/2012 1312   K 4.3 02/21/2014 0827   K 4.2 01/21/2012 1312   CL 101 02/22/2013 1415   CL 100 01/21/2012  1312   CO2 31* 02/21/2014 0827   CO2 27 01/21/2012 1312   BUN 13.8 02/21/2014 0827   BUN 14 01/21/2012 1312   CREATININE 0.8 02/21/2014 0827   CREATININE 0.87 01/21/2012 1312      Component Value Date/Time   CALCIUM 9.5 02/21/2014 0827   CALCIUM 9.0 01/21/2012 1312   ALKPHOS 93 02/21/2014 0827   ALKPHOS 132* 01/21/2012 1312   AST 22 02/21/2014 0827   AST 27 01/21/2012 1312   ALT 17 02/21/2014 0827   ALT 20 01/21/2012 1312   BILITOT 0.79 02/21/2014 0827   BILITOT 0.5 01/21/2012 1312       RADIOGRAPHIC STUDIES: Chest x-ray is still pending.  ASSESSMENT AND PLAN: This is a very pleasant 80 years old white male with stage IA non-small cell lung cancer status post wedge resection of the right upper lobe performed in December of 2005 and the patient has been observation since that time with no evidence for disease recurrence. I recommended for the patient to have chest x-ray performed today and if no abnormalities, I would see him back for followup visit in one year with repeat CBC, comprehensive metabolic panel and chest x-ray.  He was advised to call me immediately if he has any concerning symptoms in the interval.   All questions were answered. The patient knows to call the clinic with any problems, questions or concerns. We can certainly see the patient much sooner if necessary.  Disclaimer: This note was dictated with voice recognition software. Similar sounding words can inadvertently be transcribed and may not be corrected upon review.

## 2014-03-21 ENCOUNTER — Encounter: Payer: Medicare Other | Attending: Internal Medicine | Admitting: Dietician

## 2014-03-21 ENCOUNTER — Encounter: Payer: Self-pay | Admitting: Dietician

## 2014-03-21 VITALS — Ht 72.0 in | Wt 225.0 lb

## 2014-03-21 DIAGNOSIS — E1149 Type 2 diabetes mellitus with other diabetic neurological complication: Secondary | ICD-10-CM

## 2014-03-21 DIAGNOSIS — Z713 Dietary counseling and surveillance: Secondary | ICD-10-CM | POA: Insufficient documentation

## 2014-03-21 DIAGNOSIS — E119 Type 2 diabetes mellitus without complications: Secondary | ICD-10-CM | POA: Diagnosis not present

## 2014-03-21 NOTE — Patient Instructions (Signed)
Eat 3 meals a day plus snacks as needed Continue to keep food on hand for low blood sugar Test blood sugar as recommended by your doctor

## 2014-03-21 NOTE — Progress Notes (Signed)
  Medical Nutrition Therapy:  Appt start time: 0915 end time:  0254.   Assessment:  Primary concerns today: Diabetes. Blood sugar usually 80-120 in the morning.This morning 197, exceptionally high, patient believes it is due to the double cheese burger he ate the night before. When his blood sugar of 80, patient feels nausea. Blood sugar close to 200 is recent highest. Supposed to check it 2x's a day, usually only checks in the morning. Patient has had diabetes for 25 years, and exhibited a good understanding of what foods increase his blood sugar. When we reviewed sources of carbohydrate and portion sizes and patient was knowledgeable. Patient also reported that he regularly reads Diabetes magazine. Taking into account patient's age, knowledge, cooking ability, and duration of diabetes, patient's diet is adequate for management of his blood sugar.   Preferred Learning Style:   No preference indicated   Learning Readiness:   Change in Progress  MEDICATIONS: See Chart   DIETARY INTAKE:  24-hr recall:  B (2:70 AM): slice of bread with cheese toasted, pack of plain oatmeal with blueberries and milk AND instant cheese grits plus slice of cheese, hot dog, cup of coffee with truvia and powdered creamer  Snk ( AM): few cheetos - half a handful, water  L (12:00-1:00 when home 2:00-3:00 when on road PM): home - chicken breast, make chicken salad sandwich, sugar free strawberries cool-whip from [employer], sugar free cookies. Beenie weenies, water or 1% milk 8 oz. On the Road 1 package - NABS, or NICOs - cookies, keeps them with him where he goes - uses when low blood sugar Snk ( PM): a few cashews D ( PM): sometimes friend cooks, Academic librarian, doesn't know how to cook, scrambled egg whites and sausage and grits. If eats out takes half the meal home. Cheese sandwich and hot dog Snk ( PM): Will eat before bed to avoid low blood sugar, cookie, cheetos. Beverages:   Usual physical activity: None - back pain  prevents  Estimated energy needs: 2000 calories 225 g carbohydrates 150 g protein 56 g fat  Nutritional Diagnosis:  [place]-2.1 Inpaired nutrition utilization As related to glucose metabolism.  As evidenced by A1C 7.8.    Intervention:  Nutrition reinforcement, sources of carbohydrate. Encouraged patient to continue to eat 3 meals daily and snacks as needed.  Eat 3 meals a day plus snacks as needed Continue to keep food on hand for low blood sugar Test blood sugar as recommended by your doctor  Teaching Method Utilized:  Auditory   Barriers to learning/adherence to lifestyle change: None  Demonstrated degree of understanding via:  Teach Back   Monitoring/Evaluation:  Dietary intake and body weight prn.

## 2014-03-26 DIAGNOSIS — Z23 Encounter for immunization: Secondary | ICD-10-CM | POA: Diagnosis not present

## 2014-03-26 DIAGNOSIS — E1142 Type 2 diabetes mellitus with diabetic polyneuropathy: Secondary | ICD-10-CM | POA: Diagnosis not present

## 2014-03-26 DIAGNOSIS — Z0000 Encounter for general adult medical examination without abnormal findings: Secondary | ICD-10-CM | POA: Diagnosis not present

## 2014-03-26 DIAGNOSIS — Z6830 Body mass index (BMI) 30.0-30.9, adult: Secondary | ICD-10-CM | POA: Diagnosis not present

## 2014-03-26 DIAGNOSIS — E1149 Type 2 diabetes mellitus with other diabetic neurological complication: Secondary | ICD-10-CM | POA: Diagnosis not present

## 2014-03-26 DIAGNOSIS — Z1331 Encounter for screening for depression: Secondary | ICD-10-CM | POA: Diagnosis not present

## 2014-03-26 DIAGNOSIS — E663 Overweight: Secondary | ICD-10-CM | POA: Diagnosis not present

## 2014-05-07 DIAGNOSIS — N320 Bladder-neck obstruction: Secondary | ICD-10-CM | POA: Diagnosis not present

## 2014-05-07 DIAGNOSIS — N4340 Spermatocele of epididymis, unspecified: Secondary | ICD-10-CM | POA: Diagnosis not present

## 2014-05-07 DIAGNOSIS — N400 Enlarged prostate without lower urinary tract symptoms: Secondary | ICD-10-CM | POA: Diagnosis not present

## 2014-05-11 DIAGNOSIS — E1142 Type 2 diabetes mellitus with diabetic polyneuropathy: Secondary | ICD-10-CM | POA: Diagnosis not present

## 2014-05-11 DIAGNOSIS — E1149 Type 2 diabetes mellitus with other diabetic neurological complication: Secondary | ICD-10-CM | POA: Diagnosis not present

## 2014-05-14 ENCOUNTER — Encounter: Payer: Self-pay | Admitting: Podiatry

## 2014-05-14 ENCOUNTER — Ambulatory Visit (INDEPENDENT_AMBULATORY_CARE_PROVIDER_SITE_OTHER): Payer: Medicare Other | Admitting: Podiatry

## 2014-05-14 VITALS — BP 163/87 | HR 84 | Resp 16

## 2014-05-14 DIAGNOSIS — L03039 Cellulitis of unspecified toe: Secondary | ICD-10-CM

## 2014-05-14 NOTE — Progress Notes (Signed)
Subjective:     Patient ID: Chad Fuller, male   DOB: October 30, 1926, 80 y.o.   MRN: [id number]  HPI patient presents stating that he is having trouble does big toenail on his right foot that it's taken it's been red and he was concerned about infection   Review of Systems     Objective:   Physical Exam Neurovascular status intact with nailbed right hallux   being thick and irritated with proximal erythema around the medial side. It is localized with no indications of proximal spread Assessment:     Paronychia right hallux medial border    Plan:     Infiltrated the right hallux 60 mg I can Marcaine mixture remove the border/and allowed channel for drainage to occur. Reappoint her recheck again in 2 weeks and instructed on soaks

## 2014-05-14 NOTE — Patient Instructions (Signed)
ANTIBACTERIAL SOAP INSTRUCTIONS  THE DAY AFTER PROCEDURE  Please follow the instructions your doctor has marked.   Shower as usual. Before getting out, place a drop of antibacterial liquid soap (Dial) on a wet, clean washcloth.  Gently wipe washcloth over affected area.  Afterward, rinse the area with warm water.  Blot the area dry with a soft cloth and cover with antibiotic ointment (neosporin, polysporin, bacitracin) and band aid or gauze and tape  OR    Place 3-4 drops of antibacterial liquid soap in a quart of warm tap water.  Submerge foot into water for 20 minutes.  If bandage was applied after your procedure, leave on to allow for easy lift off, then remove and continue with soak for the remaining time.  Next, blot area dry with a soft cloth and cover with a bandage.  Apply other medications as directed by your doctor, such as cortisporin otic solution (eardrops) or neosporin antibiotic ointment

## 2014-05-22 ENCOUNTER — Telehealth: Payer: Self-pay | Admitting: *Deleted

## 2014-05-22 NOTE — Telephone Encounter (Signed)
Need to know about my foot, what I'm supposed to do to it.  Please give me a call.  I returned his call.  He asked how long does he need to keep soaking and putting Neosporin on with a bandaid.  I told him as long as he has drainage.  He stated there's no drainage, when I take the bandage off it's dry to the bone.  I told him he can stop the soaks and the dressings.  He stated I'm Diabetic, it's okay to go without putting anything on it.  I told him yes.

## 2014-06-04 DIAGNOSIS — L819 Disorder of pigmentation, unspecified: Secondary | ICD-10-CM | POA: Diagnosis not present

## 2014-06-04 DIAGNOSIS — D485 Neoplasm of uncertain behavior of skin: Secondary | ICD-10-CM | POA: Diagnosis not present

## 2014-06-04 DIAGNOSIS — D235 Other benign neoplasm of skin of trunk: Secondary | ICD-10-CM | POA: Diagnosis not present

## 2014-06-04 DIAGNOSIS — L821 Other seborrheic keratosis: Secondary | ICD-10-CM | POA: Diagnosis not present

## 2014-06-20 DIAGNOSIS — J209 Acute bronchitis, unspecified: Secondary | ICD-10-CM | POA: Diagnosis not present

## 2014-06-20 DIAGNOSIS — E1142 Type 2 diabetes mellitus with diabetic polyneuropathy: Secondary | ICD-10-CM | POA: Diagnosis not present

## 2014-06-21 ENCOUNTER — Ambulatory Visit (INDEPENDENT_AMBULATORY_CARE_PROVIDER_SITE_OTHER): Payer: Medicare Other | Admitting: Podiatrist

## 2014-06-21 ENCOUNTER — Encounter: Payer: Self-pay | Admitting: Podiatrist

## 2014-06-21 VITALS — BP 122/71 | HR 89 | Resp 16 | Ht 74.0 in | Wt 220.0 lb

## 2014-06-21 DIAGNOSIS — B351 Tinea unguium: Secondary | ICD-10-CM | POA: Diagnosis not present

## 2014-06-21 DIAGNOSIS — M79609 Pain in unspecified limb: Secondary | ICD-10-CM | POA: Diagnosis not present

## 2014-06-21 DIAGNOSIS — E1149 Type 2 diabetes mellitus with other diabetic neurological complication: Secondary | ICD-10-CM

## 2014-06-21 DIAGNOSIS — M79673 Pain in unspecified foot: Secondary | ICD-10-CM

## 2014-06-21 MED ORDER — GABAPENTIN 100 MG PO CAPS: 100.0000 mg | ORAL_CAPSULE | Freq: Three times a day (TID) | ORAL | Status: DC

## 2014-06-21 NOTE — Progress Notes (Signed)
HPI: Patient presents today for follow up of diabetic foot and nail care. Past medical history, meds, and allergies reviewed. Patient states blood sugar is under good  control.   Objective:   Objective:  Patients chart is reviewed.  Vascular status reveals pedal pulses noted at  1 out of 4 dp and pt bilateral .  Neurological sensation is Decreased to [employer] monofilament bilateral at 2/5 sites bilateral.  Dermatological exam reveals  absence of pre ulcerative/ hyperkeratotic lesions.   Toenails are elongated, incurvated, discolored, dystrophic with ingrown deformity present. Pes planus bilateral deformity   Assessment: Diabetes with Neuropathy , Ingrown nail deformity,  Plan: Discussed treatment options and alternatives. Debrided nails without complication.   Return appointment recommended at routine intervals of 3 months.   Trudie Buckler, DPM

## 2014-06-21 NOTE — Patient Instructions (Signed)
Diabetic Neuropathy Diabetic neuropathy is a nerve disease or nerve damage that is caused by diabetes mellitus. About half of all people with diabetes mellitus have some form of nerve damage. Nerve damage is more common in those who have had diabetes mellitus for many years and who generally have not had good control of their blood sugar (glucose) level. Diabetic neuropathy is a common complication of diabetes mellitus. There are three more common types of diabetic neuropathy and a fourth type that is less common and less understood:   Peripheral neuropathy--This is the most common type of diabetic neuropathy. It causes damage to the nerves of the feet and legs first and then eventually the hands and arms.The damage affects the ability to sense touch.  Autonomic neuropathy--This type causes damage to the autonomic nervous system, which controls the following functions:  Heartbeat.  Body temperature.  Blood pressure.  Urination.  Digestion.  Sweating.  Sexual function.  Focal neuropathy--Focal neuropathy can be painful and unpredictable and occurs most often in older adults with diabetes mellitus. It involves a specific nerve or one area and often comes on suddenly. It usually does not cause long-term problems.  Radiculoplexus neuropathy-- Sometimes called lumbosacral radiculoplexus neuropathy, radiculoplexus neuropathy affects the nerves of the thighs, hips, buttocks, or legs. It is more common in people with type 2 diabetes mellitus and in older men. It is characterized by debilitating pain, weakness, and atrophy, usually in the thigh muscles. CAUSES  The cause of peripheral, autonomic, and focal neuropathies is diabetes mellitus that is uncontrolled and high glucose levels. The cause of radiculoplexus neuropathy is unknown. However, it is thought to be caused by inflammation related to uncontrolled glucose levels. SIGNS AND SYMPTOMS  Peripheral Neuropathy Peripheral neuropathy develops  slowly over time. When the nerves of the feet and legs no longer work there may be:   Burning, stabbing, or aching pain in the legs or feet.  Inability to feel pressure or pain in your feet. This can lead to:  Thick calluses over pressure areas.  Pressure sores.  Ulcers.  Foot deformities.  Reduced ability to feel temperature changes.  Muscle weakness. Autonomic Neuropathy The symptoms of autonomic neuropathy vary depending on which nerves are affected. Symptoms may include:  Problems with digestion, such as:  Feeling sick to your stomach (nausea).  Vomiting.  Bloating.  Constipation.  Diarrhea.  Abdominal pain.  Difficulty with urination. This occurs if you lose your ability to sense when your bladder is full. Problems include:  Urine leakage (incontinence).  Inability to empty your bladder completely (retention).  Rapid or irregular heartbeat (palpitations).  Blood pressure drops when you stand up (orthostatic hypotension). When you stand up you may feel:  Dizzy.  Weak.  Faint.  In men, inability to attain and maintain an erection.  In women, vaginal dryness and problems with decreased sexual desire and arousal.  Problems with body temperature regulation.  Increased or decreased sweating. Focal Neuropathy  Abnormal eye movements or abnormal alignment of both eyes.  Weakness in the wrist.  Foot drop. This results in an inability to lift the foot properly and abnormal walking or foot movement.  Paralysis on one side of your face (Bell palsy).  Chest or abdominal pain. Radiculoplexus Neuropathy  Sudden, severe pain in your hip, thigh, or buttocks.  Weakness and wasting of thigh muscles.  Difficulty rising from a seated position.  Abdominal swelling.  Unexplained weight loss (usually more than 10 lb [4.5 kg]). DIAGNOSIS  Peripheral Neuropathy Your senses may   be tested. Sensory function testing can be done with:  A light touch using a  monofilament.  A vibration with tuning fork.  A sharp sensation with a pin prick. Other tests that can help diagnose neuropathy are:  Nerve conduction velocity. This test checks the transmission of an electrical current through a nerve.  Electromyography. This shows how muscles respond to electrical signals transmitted by nearby nerves.  Quantitative sensory testing. This is used to assess how your nerves respond to vibrations and changes in temperature. Autonomic Neuropathy Diagnosis is often based on reported symptoms. Tell your health care Olanna Percifield if you experience:   Dizziness.   Constipation.   Diarrhea.   Inappropriate urination or inability to urinate.   Inability to get or maintain an erection.  Tests that may be done include:   Electrocardiography or Holter monitor. These are tests that can help show problems with the heart rate or heart rhythm.   An X-ray exam may be done. Focal Neuropathy Diagnosis is made based on your symptoms and what your health care Jaccob Czaplicki finds during your exam. Other tests may be done. They may include:  Nerve conduction velocities. This checks the transmission of electrical current through a nerve.  Electromyography. This shows how muscles respond to electrical signals transmitted by nearby nerves.  Quantitative sensory testing. This test is used to assess how your nerves respond to vibration and changes in temperature. Radiculoplexus Neuropathy  Often the first thing is to eliminate any other issue or problems that might be the cause, as there is no stick test for diagnosis.  X-ray exam of your spine and lumbar region.  Spinal tap to rule out cancer.  MRI to rule out other lesions. TREATMENT  Once nerve damage occurs, it cannot be reversed. The goal of treatment is to keep the disease or nerve damage from getting worse and affecting more nerve fibers. Controlling your blood glucose level is the key. Most people with  radiculoplexus neuropathy see at least a partial improvement over time. You will need to keep your blood glucose and HbA1c levels in the target range determined by your health care Issaic Welliver. Things that help control blood glucose levels include:   Blood glucose monitoring.   Meal planning.   Physical activity.   Diabetes medicine.  Over time, maintaining lower blood glucose levels helps lessen symptoms. Sometimes, prescription pain medicine is needed. HOME CARE INSTRUCTIONS:  Do not smoke.  Keep your blood glucose level in the range that you and your health care Ariauna Farabee have determined acceptable for you.  Keep your blood pressure level in the range that you and your health care Jagger Beahm have determined acceptable for you.  Eat a well-balanced diet.  Be active every day.  Check your feet every day. SEEK MEDICAL CARE IF:   You have burning, stabbing, or aching pain in the legs or feet.  You are unable to feel pressure or pain in your feet.  You develop problems with digestion such as:  Nausea.  Vomiting.  Bloating.  Constipation.  Diarrhea.  Abdominal pain.  You have difficulty with urination, such as:  Incontinence.  Retention.  You have palpitations.  You develop orthostatic hypotension. When you stand up you may feel:  Dizzy.  Weak.  Faint.  You cannot attain and maintain an erection (in men).  You have vaginal dryness and problems with decreased sexual desire and arousal (in women).  You have severe pain in your thighs, legs, or buttocks.  You have unexplained weight loss.   Document Released: 02/22/2002 Document Revised: 10/04/2013 Document Reviewed: 05/25/2013 [employer] Patient Information 2015 [employer]. This information is not intended to replace advice given to you by your health care Eisley Barber. Make sure you discuss any questions you have with your health care Jontay Maston. 

## 2014-07-17 DIAGNOSIS — M5126 Other intervertebral disc displacement, lumbar region: Secondary | ICD-10-CM | POA: Diagnosis not present

## 2014-07-17 DIAGNOSIS — IMO0002 Reserved for concepts with insufficient information to code with codable children: Secondary | ICD-10-CM | POA: Diagnosis not present

## 2014-07-17 DIAGNOSIS — M25559 Pain in unspecified hip: Secondary | ICD-10-CM | POA: Diagnosis not present

## 2014-07-17 DIAGNOSIS — M999 Biomechanical lesion, unspecified: Secondary | ICD-10-CM | POA: Diagnosis not present

## 2014-07-20 ENCOUNTER — Emergency Department (INDEPENDENT_AMBULATORY_CARE_PROVIDER_SITE_OTHER)
Admission: EM | Admit: 2014-07-20 | Discharge: 2014-07-20 | Disposition: A | Discharge status: Home / Self Care | Payer: Medicare Other | Source: Home / Self Care

## 2014-07-20 ENCOUNTER — Emergency Department (INDEPENDENT_AMBULATORY_CARE_PROVIDER_SITE_OTHER): Payer: Medicare Other

## 2014-07-20 ENCOUNTER — Encounter (HOSPITAL_COMMUNITY): Payer: Self-pay | Admitting: Emergency Medicine

## 2014-07-20 DIAGNOSIS — M1710 Unilateral primary osteoarthritis, unspecified knee: Secondary | ICD-10-CM | POA: Diagnosis not present

## 2014-07-20 DIAGNOSIS — M1711 Unilateral primary osteoarthritis, right knee: Secondary | ICD-10-CM

## 2014-07-20 DIAGNOSIS — IMO0002 Reserved for concepts with insufficient information to code with codable children: Secondary | ICD-10-CM | POA: Diagnosis not present

## 2014-07-20 MED ORDER — TRIAMCINOLONE ACETONIDE 40 MG/ML IJ SUSP
INTRAMUSCULAR | Status: AC
  Filled 2014-07-20: qty 1

## 2014-07-20 NOTE — Discharge Instructions (Signed)
Ice pack tonight, see orthopedist next week if further problems.

## 2014-07-20 NOTE — ED Notes (Signed)
Ice pack applied to pt's Right knee, as instructed by Dr. Juventino Slovak. Pt instructed on how to use it.

## 2014-07-20 NOTE — ED Notes (Signed)
C/o right knee pain.  Pain radiates down leg.  No otc meds tried.  Symptoms present x 3 days.

## 2014-07-20 NOTE — ED Provider Notes (Addendum)
CSN: [id number]     Arrival date & time 07/20/14  1340 History   None    Chief Complaint  Patient presents with  . Knee Pain   (Consider location/radiation/quality/duration/timing/severity/associated sxs/prior Treatment) Patient is a 80 y.o. male presenting with knee pain. The history is provided by the patient and a relative.  Knee Pain Location:  Knee Time since incident:  3 days Injury: no   Knee location:  R knee Pain details:    Quality:  Aching and dull   Progression:  Unchanged Chronicity:  New Dislocation: no   Prior injury to area:  No Associated symptoms: decreased ROM, stiffness and swelling   Associated symptoms: no back pain, no fever, no numbness and no tingling   Risk factors: known bone disorder   Risk factors comment:  S/p left tkr   Past Medical History  Diagnosis Date  . Diabetes mellitus   . Cancer     lung  . Prostate enlargement   . History of blood clots   . CAD (coronary artery disease)   . Pulmonary embolus july 2005   Past Surgical History  Procedure Laterality Date  . Lung removal, partial    . Coronary stent placement    . Knee surgery    . Filtering procedure      reports filter placed after knee surgery for blood clots  . I&d extremity  08/16/2012    Procedure: MINOR IRRIGATION AND DEBRIDEMENT EXTREMITY;  Surgeon: Tennis Must, MD;  Location: [facility];  Service: Orthopedics;  Laterality: Right;   History reviewed. No pertinent family history. History  Substance Use Topics  . Smoking status: Former Smoker    Types: Cigarettes    Quit date: 07/28/1974  . Smokeless tobacco: Not on file  . Alcohol Use: No    Review of Systems  Constitutional: Negative.  Negative for fever.  Musculoskeletal: Positive for gait problem, joint swelling and stiffness. Negative for back pain.  Skin: Negative.     Allergies  Review of patient's allergies indicates no known allergies.  Home Medications   Prior to Admission  medications   Medication Sig Start Date End Date Taking? Authorizing Provider  ezetimibe (ZETIA) 10 MG tablet Take 10 mg by mouth daily.   Yes Historical Provider, MD  finasteride (PROSCAR) 5 MG tablet Take 5 mg by mouth daily.     Yes Historical Provider, MD  gabapentin (NEURONTIN) 100 MG capsule Take 1 capsule (100 mg total) by mouth 3 (three) times daily. 06/21/14  Yes Trudie Buckler, DPM  glipiZIDE (GLUCOTROL XL) 10 MG 24 hr tablet Take 10 mg by mouth daily.     Yes Historical Provider, MD  GLUCOSAMINE CHONDROITIN COMPLX PO Take 2 tablets by mouth daily.     Yes Historical Provider, MD  hydrochlorothiazide (HYDRODIURIL) 25 MG tablet Take 25 mg by mouth daily.     Yes Historical Provider, MD  insulin glargine (LANTUS) 100 UNIT/ML injection Inject 20-60 Units into the skin at bedtime. Dosed based on sugar levels:  50 in the morning   Yes Historical Provider, MD  linagliptin (TRADJENTA) 5 MG TABS tablet Take 5 mg by mouth daily.   Yes Historical Provider, MD  meloxicam (MOBIC) 7.5 MG tablet Take 7.5 mg by mouth daily.   Yes Historical Provider, MD  metFORMIN (GLUCOPHAGE-XR) 500 MG 24 hr tablet Take 500 mg by mouth daily with breakfast.   Yes Historical Provider, MD  Multiple Vitamins-Minerals (MULTIVITAMINS THER. W/MINERALS) TABS Take 1 tablet by  mouth daily.     Yes Historical Provider, MD  pantoprazole (PROTONIX) 20 MG tablet Take 20 mg by mouth daily.     Yes Historical Provider, MD  tamsulosin (FLOMAX) 0.4 MG CAPS capsule  09/02/13  Yes Historical Provider, MD  ACCU-CHEK AVIVA PLUS test strip  01/27/13   Historical Provider, MD   BP 156/83  Pulse 70  Temp(Src) 97.1 F (36.2 C) (Oral)  Resp 18  SpO2 95% Physical Exam  Nursing note and vitals reviewed. Constitutional: He is oriented to person, place, and time. He appears well-developed and well-nourished.  Musculoskeletal: He exhibits tenderness.       Right knee: He exhibits decreased range of motion, swelling, effusion, deformity, bony  tenderness and abnormal meniscus. He exhibits no erythema.       Legs: Neurological: He is alert and oriented to person, place, and time.  Skin: Skin is warm and dry.    ED Course  ARTHOCENTESIS Date/Time: 07/20/2014 4:17 PM Performed by: Billy Fischer Authorized by: Ihor Gully D Consent: Verbal consent obtained. Consent given by: patient Indications: pain  Body area: knee Joint: right knee Local anesthesia used: no Patient sedated: no Preparation: Patient was prepped and draped in the usual sterile fashion. Needle gauge: 22 G Ultrasound guidance: no Approach: lateral Triamcinolone amount: 40 mg Bupivacaine 0.50% amount: 5 ml Patient tolerance: Patient tolerated the procedure well with no immediate complications. Comments: Sx improved   (including critical care time) Labs Review Labs Reviewed - No data to display  Imaging Review Dg Knee Complete 4 Views Right  07/20/2014   CLINICAL DATA:  Right knee pain.  EXAM: RIGHT KNEE - COMPLETE 4+ VIEW  COMPARISON:  None.  FINDINGS: No evidence of acute or subacute fracture or dislocation. Moderate medial compartment joint space narrowing. Patellofemoral and lateral compartment joint spaces relatively well-preserved. No evidence of joint effusion. Femoropopliteal and peroneal artery atherosclerosis.  IMPRESSION: No acute or subacute osseous abnormality. Moderate medial compartment degenerative changes.   Electronically Signed   By: Evangeline Dakin M.D.   On: 07/20/2014 15:47    X-rays reviewed and report per radiologist.  MDM   1. Primary osteoarthritis of right knee    Sx improved after injection.    Billy Fischer, MD 07/20/14 [place], MD 07/21/14 [phone]

## 2014-07-24 ENCOUNTER — Other Ambulatory Visit: Payer: Self-pay | Admitting: Geriatric Medicine

## 2014-07-24 DIAGNOSIS — IMO0002 Reserved for concepts with insufficient information to code with codable children: Secondary | ICD-10-CM | POA: Diagnosis not present

## 2014-07-24 DIAGNOSIS — Z79899 Other long term (current) drug therapy: Secondary | ICD-10-CM | POA: Diagnosis not present

## 2014-07-25 ENCOUNTER — Other Ambulatory Visit: Payer: Medicare Other

## 2014-07-26 ENCOUNTER — Other Ambulatory Visit: Payer: Self-pay | Admitting: Geriatric Medicine

## 2014-07-26 ENCOUNTER — Ambulatory Visit
Admission: RE | Admit: 2014-07-26 | Discharge: 2014-07-26 | Disposition: A | Discharge status: Home / Self Care | Payer: Medicare Other | Source: Ambulatory Visit | Attending: Geriatric Medicine | Admitting: Geriatric Medicine

## 2014-07-26 DIAGNOSIS — M48061 Spinal stenosis, lumbar region without neurogenic claudication: Secondary | ICD-10-CM | POA: Diagnosis not present

## 2014-07-26 DIAGNOSIS — IMO0002 Reserved for concepts with insufficient information to code with codable children: Secondary | ICD-10-CM

## 2014-07-26 DIAGNOSIS — M47817 Spondylosis without myelopathy or radiculopathy, lumbosacral region: Secondary | ICD-10-CM | POA: Diagnosis not present

## 2014-08-02 DIAGNOSIS — Z6828 Body mass index (BMI) 28.0-28.9, adult: Secondary | ICD-10-CM | POA: Diagnosis not present

## 2014-08-02 DIAGNOSIS — M47817 Spondylosis without myelopathy or radiculopathy, lumbosacral region: Secondary | ICD-10-CM | POA: Diagnosis not present

## 2014-08-06 ENCOUNTER — Other Ambulatory Visit: Payer: Self-pay | Admitting: Neurosurgery

## 2014-08-06 DIAGNOSIS — G8929 Other chronic pain: Secondary | ICD-10-CM

## 2014-08-06 DIAGNOSIS — M545 Low back pain: Principal | ICD-10-CM

## 2014-08-08 ENCOUNTER — Other Ambulatory Visit: Payer: Medicare Other

## 2014-08-08 ENCOUNTER — Ambulatory Visit
Admission: RE | Admit: 2014-08-08 | Discharge: 2014-08-08 | Disposition: A | Discharge status: Home / Self Care | Payer: Medicare Other | Source: Ambulatory Visit | Attending: Neurosurgery | Admitting: Neurosurgery

## 2014-08-08 VITALS — BP 160/81 | HR 93

## 2014-08-08 DIAGNOSIS — M545 Low back pain, unspecified: Secondary | ICD-10-CM | POA: Diagnosis not present

## 2014-08-08 DIAGNOSIS — G8929 Other chronic pain: Secondary | ICD-10-CM

## 2014-08-08 MED ORDER — METHYLPREDNISOLONE ACETATE 40 MG/ML INJ SUSP (RADIOLOG
120.0000 mg | Freq: Once | INTRAMUSCULAR | Status: AC
  Administered 2014-08-08: 120 mg via EPIDURAL

## 2014-08-08 MED ORDER — IOHEXOL 180 MG/ML  SOLN
1.0000 mL | Freq: Once | INTRAMUSCULAR | Status: AC | PRN
  Administered 2014-08-08: 1 mL via EPIDURAL

## 2014-08-08 NOTE — Discharge Instructions (Signed)

## 2014-09-13 DIAGNOSIS — M47817 Spondylosis without myelopathy or radiculopathy, lumbosacral region: Secondary | ICD-10-CM | POA: Diagnosis not present

## 2014-09-19 ENCOUNTER — Ambulatory Visit (INDEPENDENT_AMBULATORY_CARE_PROVIDER_SITE_OTHER): Payer: Medicare Other | Admitting: Podiatrist

## 2014-09-19 ENCOUNTER — Encounter: Payer: Self-pay | Admitting: Podiatrist

## 2014-09-19 DIAGNOSIS — B351 Tinea unguium: Secondary | ICD-10-CM | POA: Diagnosis not present

## 2014-09-19 DIAGNOSIS — M79609 Pain in unspecified limb: Secondary | ICD-10-CM | POA: Diagnosis not present

## 2014-09-19 DIAGNOSIS — M79676 Pain in unspecified toe(s): Principal | ICD-10-CM

## 2014-09-19 NOTE — Patient Instructions (Signed)

## 2014-09-23 DIAGNOSIS — Z23 Encounter for immunization: Secondary | ICD-10-CM | POA: Diagnosis not present

## 2014-09-24 DIAGNOSIS — Z961 Presence of intraocular lens: Secondary | ICD-10-CM | POA: Diagnosis not present

## 2014-09-24 DIAGNOSIS — E119 Type 2 diabetes mellitus without complications: Secondary | ICD-10-CM | POA: Diagnosis not present

## 2014-09-24 NOTE — Progress Notes (Signed)
HPI: Patient presents today for follow up of diabetic foot and nail care. Past medical history, meds, and allergies reviewed. Patient states blood sugar is under good control.  Objective: Objective: Patients chart is reviewed. Vascular status reveals pedal pulses noted at 1 out of 4 dp and pt bilateral . Neurological sensation is Decreased to [employer] monofilament bilateral at 2/5 sites bilateral. Dermatological exam reveals absence of pre ulcerative/ hyperkeratotic lesions. Toenails are elongated, incurvated, discolored, dystrophic with ingrown deformity present. Pes planus bilateral deformity  Assessment: Diabetes with Neuropathy , Ingrown nail deformity,  Plan: Discussed treatment options and alternatives. Debrided nails without complication. Return appointment recommended at routine intervals of 3 months.

## 2014-09-26 DIAGNOSIS — M47817 Spondylosis without myelopathy or radiculopathy, lumbosacral region: Secondary | ICD-10-CM | POA: Diagnosis not present

## 2014-09-26 DIAGNOSIS — E7800 Pure hypercholesterolemia, unspecified: Secondary | ICD-10-CM | POA: Diagnosis not present

## 2014-09-26 DIAGNOSIS — Z79899 Other long term (current) drug therapy: Secondary | ICD-10-CM | POA: Diagnosis not present

## 2014-09-26 DIAGNOSIS — J449 Chronic obstructive pulmonary disease, unspecified: Secondary | ICD-10-CM | POA: Diagnosis not present

## 2014-09-26 DIAGNOSIS — R5381 Other malaise: Secondary | ICD-10-CM | POA: Diagnosis not present

## 2014-09-26 DIAGNOSIS — E1142 Type 2 diabetes mellitus with diabetic polyneuropathy: Secondary | ICD-10-CM | POA: Diagnosis not present

## 2014-09-26 DIAGNOSIS — I10 Essential (primary) hypertension: Secondary | ICD-10-CM | POA: Diagnosis not present

## 2014-09-26 DIAGNOSIS — R5383 Other fatigue: Secondary | ICD-10-CM | POA: Diagnosis not present

## 2014-09-26 DIAGNOSIS — G608 Other hereditary and idiopathic neuropathies: Secondary | ICD-10-CM | POA: Diagnosis not present

## 2014-09-26 DIAGNOSIS — R269 Unspecified abnormalities of gait and mobility: Secondary | ICD-10-CM | POA: Diagnosis not present

## 2014-09-26 DIAGNOSIS — E1149 Type 2 diabetes mellitus with other diabetic neurological complication: Secondary | ICD-10-CM | POA: Diagnosis not present

## 2014-09-26 DIAGNOSIS — M6281 Muscle weakness (generalized): Secondary | ICD-10-CM | POA: Diagnosis not present

## 2014-10-02 DIAGNOSIS — G608 Other hereditary and idiopathic neuropathies: Secondary | ICD-10-CM | POA: Diagnosis not present

## 2014-10-02 DIAGNOSIS — R269 Unspecified abnormalities of gait and mobility: Secondary | ICD-10-CM | POA: Diagnosis not present

## 2014-10-02 DIAGNOSIS — M47817 Spondylosis without myelopathy or radiculopathy, lumbosacral region: Secondary | ICD-10-CM | POA: Diagnosis not present

## 2014-10-02 DIAGNOSIS — M6281 Muscle weakness (generalized): Secondary | ICD-10-CM | POA: Diagnosis not present

## 2014-10-04 DIAGNOSIS — M47817 Spondylosis without myelopathy or radiculopathy, lumbosacral region: Secondary | ICD-10-CM | POA: Diagnosis not present

## 2014-10-04 DIAGNOSIS — G608 Other hereditary and idiopathic neuropathies: Secondary | ICD-10-CM | POA: Diagnosis not present

## 2014-10-04 DIAGNOSIS — R269 Unspecified abnormalities of gait and mobility: Secondary | ICD-10-CM | POA: Diagnosis not present

## 2014-10-04 DIAGNOSIS — M6281 Muscle weakness (generalized): Secondary | ICD-10-CM | POA: Diagnosis not present

## 2014-10-08 DIAGNOSIS — R269 Unspecified abnormalities of gait and mobility: Secondary | ICD-10-CM | POA: Diagnosis not present

## 2014-10-08 DIAGNOSIS — M47817 Spondylosis without myelopathy or radiculopathy, lumbosacral region: Secondary | ICD-10-CM | POA: Diagnosis not present

## 2014-10-08 DIAGNOSIS — G608 Other hereditary and idiopathic neuropathies: Secondary | ICD-10-CM | POA: Diagnosis not present

## 2014-10-08 DIAGNOSIS — M6281 Muscle weakness (generalized): Secondary | ICD-10-CM | POA: Diagnosis not present

## 2014-10-10 DIAGNOSIS — M6281 Muscle weakness (generalized): Secondary | ICD-10-CM | POA: Diagnosis not present

## 2014-10-10 DIAGNOSIS — R269 Unspecified abnormalities of gait and mobility: Secondary | ICD-10-CM | POA: Diagnosis not present

## 2014-10-10 DIAGNOSIS — M47817 Spondylosis without myelopathy or radiculopathy, lumbosacral region: Secondary | ICD-10-CM | POA: Diagnosis not present

## 2014-10-10 DIAGNOSIS — G608 Other hereditary and idiopathic neuropathies: Secondary | ICD-10-CM | POA: Diagnosis not present

## 2014-10-15 DIAGNOSIS — M6281 Muscle weakness (generalized): Secondary | ICD-10-CM | POA: Diagnosis not present

## 2014-10-15 DIAGNOSIS — G608 Other hereditary and idiopathic neuropathies: Secondary | ICD-10-CM | POA: Diagnosis not present

## 2014-10-15 DIAGNOSIS — R269 Unspecified abnormalities of gait and mobility: Secondary | ICD-10-CM | POA: Diagnosis not present

## 2014-10-15 DIAGNOSIS — M47817 Spondylosis without myelopathy or radiculopathy, lumbosacral region: Secondary | ICD-10-CM | POA: Diagnosis not present

## 2014-10-17 DIAGNOSIS — M47817 Spondylosis without myelopathy or radiculopathy, lumbosacral region: Secondary | ICD-10-CM | POA: Diagnosis not present

## 2014-10-17 DIAGNOSIS — G608 Other hereditary and idiopathic neuropathies: Secondary | ICD-10-CM | POA: Diagnosis not present

## 2014-10-17 DIAGNOSIS — R269 Unspecified abnormalities of gait and mobility: Secondary | ICD-10-CM | POA: Diagnosis not present

## 2014-10-17 DIAGNOSIS — M6281 Muscle weakness (generalized): Secondary | ICD-10-CM | POA: Diagnosis not present

## 2014-10-25 DIAGNOSIS — M545 Low back pain: Secondary | ICD-10-CM | POA: Diagnosis not present

## 2014-10-25 DIAGNOSIS — Z6828 Body mass index (BMI) 28.0-28.9, adult: Secondary | ICD-10-CM | POA: Diagnosis not present

## 2014-10-28 DIAGNOSIS — I639 Cerebral infarction, unspecified: Secondary | ICD-10-CM

## 2014-10-28 HISTORY — DX: Cerebral infarction, unspecified: I63.9

## 2014-11-09 ENCOUNTER — Ambulatory Visit: Payer: Medicare Other | Admitting: Podiatrist

## 2014-11-09 ENCOUNTER — Ambulatory Visit (INDEPENDENT_AMBULATORY_CARE_PROVIDER_SITE_OTHER): Payer: Medicare Other | Admitting: Podiatrist

## 2014-11-09 ENCOUNTER — Encounter: Payer: Self-pay | Admitting: Podiatrist

## 2014-11-09 ENCOUNTER — Telehealth: Payer: Self-pay | Admitting: *Deleted

## 2014-11-09 VITALS — BP 157/88 | HR 77 | Resp 16

## 2014-11-09 DIAGNOSIS — L97521 Non-pressure chronic ulcer of other part of left foot limited to breakdown of skin: Secondary | ICD-10-CM

## 2014-11-09 MED ORDER — SILVER SULFADIAZINE 1 % EX CREA: 1.0000 "application " | TOPICAL_CREAM | Freq: Every day | CUTANEOUS | Status: DC

## 2014-11-09 NOTE — Progress Notes (Signed)
Blister top of left hallux-- iodosorb, silvadene cream, bandaid.   Chief Complaint  Patient presents with  . Toe Pain    1st toe left   "I just noticed that toe was raw last night. Don't know what happened to it"     HPI: Patient is 79 y.o. male who presents today for  A newly formed lesion on the top of the left great toe.  He relates he has been swelling and he is concerned his shoes rubbed too much on his toe.  He denies any local or systemic signs of infection.    No Known Allergies  Physical Exam  Patient is awake, alert, and oriented x 3.  In no acute distress.  Vascular status is intact with palpable pedal pulses at 2/4 DP and PT bilateral and capillary refill time within normal limits. Neurological sensation unchanged with decreased sensation to distal digits present.  Very shallow popped blister is present dorsum of the left hallux just proximal to the nail fold.  No redness, no swelling, no sign of infection is present. Very superficial skin wound is noted.  Assessment: Superficial ulceration left hallux  Plan:cleansed the area well with wound solution. Applied Iodosorb and a dressing. Given instructions for after care including the use of Silvadene cream and a dressing daily. This should resolve well on its own though he will watch for any signs of infection or if the area does not improve within the next 4 days.

## 2014-11-09 NOTE — Telephone Encounter (Signed)
I got a bad place on my big toe.  I have Diabetes.  She told me if anything happened to my feet to get a hold of her right then.  I didn't even know anything was wrong because I don't have feeling in my feet.  I have Diabetes bad.  That place is raw.  I'd like to see her or talk to her.  Thank you.  Patient came in today to see Dr. Valentina Lucks.

## 2014-11-09 NOTE — Patient Instructions (Signed)
Instructions for Wound Care  The most important step to healing a foot wound is to reduce the pressure on your foot -  Cleanse your foot with  warm soapy water (dial antibacterial soap or similar).  Blot dry.  Apply prescribed medication to your wound and cover with  a bandage.   Your prescribed topical medication is :  Silvadene Cream (twice daily)   If you notice any foul odor, increase in pain, pus, increased swelling, red streaks or generalized redness occurring in your foot or leg-Call our office immediately to be seen.  This may be a sign of a limb or life threatening infection that will need prompt attention.

## 2014-11-14 DIAGNOSIS — J449 Chronic obstructive pulmonary disease, unspecified: Secondary | ICD-10-CM | POA: Diagnosis not present

## 2014-11-16 DIAGNOSIS — S90422A Blister (nonthermal), left great toe, initial encounter: Secondary | ICD-10-CM | POA: Diagnosis not present

## 2014-11-16 DIAGNOSIS — E119 Type 2 diabetes mellitus without complications: Secondary | ICD-10-CM | POA: Diagnosis not present

## 2014-11-16 DIAGNOSIS — K219 Gastro-esophageal reflux disease without esophagitis: Secondary | ICD-10-CM | POA: Diagnosis not present

## 2014-11-18 ENCOUNTER — Inpatient Hospital Stay (HOSPITAL_COMMUNITY)
Admission: EM | Admit: 2014-11-18 | Discharge: 2014-11-20 | DRG: 065 | Disposition: A | Discharge status: Rehabilitation Facility | Payer: Medicare Other | Attending: Internal Medicine | Admitting: Internal Medicine

## 2014-11-18 ENCOUNTER — Emergency Department (HOSPITAL_COMMUNITY): Payer: Medicare Other

## 2014-11-18 ENCOUNTER — Encounter (HOSPITAL_COMMUNITY): Payer: Self-pay | Admitting: Emergency Medicine

## 2014-11-18 DIAGNOSIS — Z955 Presence of coronary angioplasty implant and graft: Secondary | ICD-10-CM | POA: Diagnosis not present

## 2014-11-18 DIAGNOSIS — E1140 Type 2 diabetes mellitus with diabetic neuropathy, unspecified: Secondary | ICD-10-CM | POA: Diagnosis present

## 2014-11-18 DIAGNOSIS — G8190 Hemiplegia, unspecified affecting unspecified side: Secondary | ICD-10-CM | POA: Diagnosis not present

## 2014-11-18 DIAGNOSIS — E119 Type 2 diabetes mellitus without complications: Secondary | ICD-10-CM

## 2014-11-18 DIAGNOSIS — I358 Other nonrheumatic aortic valve disorders: Secondary | ICD-10-CM | POA: Diagnosis present

## 2014-11-18 DIAGNOSIS — E118 Type 2 diabetes mellitus with unspecified complications: Secondary | ICD-10-CM | POA: Diagnosis not present

## 2014-11-18 DIAGNOSIS — N400 Enlarged prostate without lower urinary tract symptoms: Secondary | ICD-10-CM | POA: Diagnosis present

## 2014-11-18 DIAGNOSIS — I6350 Cerebral infarction due to unspecified occlusion or stenosis of unspecified cerebral artery: Secondary | ICD-10-CM | POA: Diagnosis not present

## 2014-11-18 DIAGNOSIS — G459 Transient cerebral ischemic attack, unspecified: Secondary | ICD-10-CM

## 2014-11-18 DIAGNOSIS — Z87891 Personal history of nicotine dependence: Secondary | ICD-10-CM

## 2014-11-18 DIAGNOSIS — R296 Repeated falls: Secondary | ICD-10-CM

## 2014-11-18 DIAGNOSIS — G8191 Hemiplegia, unspecified affecting right dominant side: Secondary | ICD-10-CM | POA: Diagnosis present

## 2014-11-18 DIAGNOSIS — I10 Essential (primary) hypertension: Secondary | ICD-10-CM | POA: Diagnosis not present

## 2014-11-18 DIAGNOSIS — M5030 Other cervical disc degeneration, unspecified cervical region: Secondary | ICD-10-CM | POA: Diagnosis present

## 2014-11-18 DIAGNOSIS — R269 Unspecified abnormalities of gait and mobility: Secondary | ICD-10-CM | POA: Diagnosis present

## 2014-11-18 DIAGNOSIS — Z85110 Personal history of malignant carcinoid tumor of bronchus and lung: Secondary | ICD-10-CM

## 2014-11-18 DIAGNOSIS — S0083XA Contusion of other part of head, initial encounter: Secondary | ICD-10-CM | POA: Diagnosis not present

## 2014-11-18 DIAGNOSIS — S299XXA Unspecified injury of thorax, initial encounter: Secondary | ICD-10-CM | POA: Diagnosis not present

## 2014-11-18 DIAGNOSIS — I6339 Cerebral infarction due to thrombosis of other cerebral artery: Secondary | ICD-10-CM | POA: Diagnosis not present

## 2014-11-18 DIAGNOSIS — I69391 Dysphagia following cerebral infarction: Secondary | ICD-10-CM | POA: Diagnosis not present

## 2014-11-18 DIAGNOSIS — Z902 Acquired absence of lung [part of]: Secondary | ICD-10-CM | POA: Diagnosis present

## 2014-11-18 DIAGNOSIS — I6789 Other cerebrovascular disease: Secondary | ICD-10-CM | POA: Diagnosis not present

## 2014-11-18 DIAGNOSIS — I2510 Atherosclerotic heart disease of native coronary artery without angina pectoris: Secondary | ICD-10-CM | POA: Diagnosis not present

## 2014-11-18 DIAGNOSIS — K219 Gastro-esophageal reflux disease without esophagitis: Secondary | ICD-10-CM | POA: Diagnosis present

## 2014-11-18 DIAGNOSIS — E785 Hyperlipidemia, unspecified: Secondary | ICD-10-CM | POA: Diagnosis present

## 2014-11-18 DIAGNOSIS — I6302 Cerebral infarction due to thrombosis of basilar artery: Secondary | ICD-10-CM | POA: Diagnosis not present

## 2014-11-18 DIAGNOSIS — R29898 Other symptoms and signs involving the musculoskeletal system: Secondary | ICD-10-CM | POA: Diagnosis present

## 2014-11-18 DIAGNOSIS — R42 Dizziness and giddiness: Secondary | ICD-10-CM | POA: Diagnosis not present

## 2014-11-18 DIAGNOSIS — S199XXA Unspecified injury of neck, initial encounter: Secondary | ICD-10-CM | POA: Diagnosis not present

## 2014-11-18 DIAGNOSIS — I359 Nonrheumatic aortic valve disorder, unspecified: Secondary | ICD-10-CM | POA: Diagnosis not present

## 2014-11-18 DIAGNOSIS — R471 Dysarthria and anarthria: Secondary | ICD-10-CM

## 2014-11-18 DIAGNOSIS — Z794 Long term (current) use of insulin: Secondary | ICD-10-CM

## 2014-11-18 DIAGNOSIS — I639 Cerebral infarction, unspecified: Secondary | ICD-10-CM | POA: Diagnosis present

## 2014-11-18 DIAGNOSIS — C3490 Malignant neoplasm of unspecified part of unspecified bronchus or lung: Secondary | ICD-10-CM | POA: Diagnosis not present

## 2014-11-18 DIAGNOSIS — S0990XA Unspecified injury of head, initial encounter: Secondary | ICD-10-CM | POA: Diagnosis not present

## 2014-11-18 DIAGNOSIS — R51 Headache: Secondary | ICD-10-CM | POA: Diagnosis not present

## 2014-11-18 LAB — CBC WITH DIFFERENTIAL/PLATELET
Basophils Absolute: 0 10*3/uL (ref 0.0–0.1)
Basophils Relative: 0 % (ref 0–1)
Eosinophils Absolute: 0.1 10*3/uL (ref 0.0–0.7)
Eosinophils Relative: 2 % (ref 0–5)
HCT: 41.1 % (ref 39.0–52.0)
HEMOGLOBIN: 14.2 g/dL (ref 13.0–17.0)
Lymphocytes Relative: 19 % (ref 12–46)
Lymphs Abs: 1.3 10*3/uL (ref 0.7–4.0)
MCH: 31.4 pg (ref 26.0–34.0)
MCHC: 34.5 g/dL (ref 30.0–36.0)
MCV: 90.9 fL (ref 78.0–100.0)
MONOS PCT: 6 % (ref 3–12)
Monocytes Absolute: 0.4 10*3/uL (ref 0.1–1.0)
NEUTROS ABS: 5.1 10*3/uL (ref 1.7–7.7)
NEUTROS PCT: 73 % (ref 43–77)
Platelets: 217 10*3/uL (ref 150–400)
RBC: 4.52 MIL/uL (ref 4.22–5.81)
RDW: 12.6 % (ref 11.5–15.5)
WBC: 7 10*3/uL (ref 4.0–10.5)

## 2014-11-18 LAB — COMPREHENSIVE METABOLIC PANEL
ALK PHOS: 104 U/L (ref 39–117)
ALT: 16 U/L (ref 0–53)
ANION GAP: 14 (ref 5–15)
AST: 22 U/L (ref 0–37)
Albumin: 3.7 g/dL (ref 3.5–5.2)
BILIRUBIN TOTAL: 0.5 mg/dL (ref 0.3–1.2)
BUN: 15 mg/dL (ref 6–23)
CHLORIDE: 98 meq/L (ref 96–112)
CO2: 26 mEq/L (ref 19–32)
Calcium: 9.6 mg/dL (ref 8.4–10.5)
Creatinine, Ser: 0.69 mg/dL (ref 0.50–1.35)
GFR calc Af Amer: >90 mL/min (ref >90)
GFR, EST NON AFRICAN AMERICAN: 84 mL/min — AB (ref >90)
Glucose, Bld: 248 mg/dL — ABNORMAL HIGH (ref 70–99)
POTASSIUM: 4 meq/L (ref 3.7–5.3)
Sodium: 138 mEq/L (ref 137–147)
Total Protein: 6.7 g/dL (ref 6.0–8.3)

## 2014-11-18 LAB — TROPONIN I

## 2014-11-18 MED ORDER — ONDANSETRON HCL 4 MG/2ML IJ SOLN
4.0000 mg | Freq: Once | INTRAMUSCULAR | Status: AC
  Administered 2014-11-18: 4 mg via INTRAVENOUS
  Filled 2014-11-18: qty 2

## 2014-11-18 MED ORDER — MECLIZINE HCL 25 MG PO TABS
25.0000 mg | ORAL_TABLET | Freq: Once | ORAL | Status: AC
  Administered 2014-11-18: 25 mg via ORAL
  Filled 2014-11-18: qty 1

## 2014-11-18 NOTE — ED Notes (Signed)
Mingo Amber, MD at bedside.

## 2014-11-18 NOTE — ED Notes (Signed)
Patient transported to CT 

## 2014-11-18 NOTE — ED Notes (Signed)
Pt. felt dizzy and lightheaded and fell at home this evening hit his head against linoleum , no LOC / alert and oriented , reports mild headache with right forehead bruise , speech clear with no facial asymmetry , equal strong grips with no arm drift.

## 2014-11-18 NOTE — ED Provider Notes (Signed)
CSN: [id number]     Arrival date & time 11/18/14  1958 History   First MD Initiated Contact with Patient 11/18/14 2024     Chief Complaint  Patient presents with  . Fall     (Consider location/radiation/quality/duration/timing/severity/associated sxs/prior Treatment) Patient is a 80 y.o. male presenting with fall. The history is provided by the patient. No language interpreter was used.  Fall Associated symptoms include weakness. Pertinent negatives include no chills, fever or headaches. Associated symptoms comments: The patient is a difficult historian. He states that he started feeling lightheaded, like he was pre-syncopal, several weeks ago after being started on gabapentin for diabetic neuropathy. He reports multiple falls secondary to lightheadedness and feeling off balance, so he stopped taking the medication 2 weeks ago and reports symptoms improved. He also states "I've had dizziness and a number of falls recently" which contradicts his stated history of symptoms resolving for a period of time. He states that he started having recurrent symptoms today, causing another fall. He has had nausea without significant vomiting, but is unable to say how long the nausea has been present. No known fever. He denies visual changes. No chest pain or SOB at any time. He feels generally weak without lateralizing weakness. No neck pain. His children, who are at bedside, report that his speech was unclear today while talking on the phone earlier today, and it has improved since then..    Past Medical History  Diagnosis Date  . Diabetes mellitus   . Cancer     lung  . Prostate enlargement   . History of blood clots   . CAD (coronary artery disease)   . Pulmonary embolus july 2005   Past Surgical History  Procedure Laterality Date  . Lung removal, partial    . Coronary stent placement    . Knee surgery    . Filtering procedure      reports filter placed after knee surgery for blood clots  . I&d  extremity  08/16/2012    Procedure: MINOR IRRIGATION AND DEBRIDEMENT EXTREMITY;  Surgeon: Tennis Must, MD;  Location: [facility];  Service: Orthopedics;  Laterality: Right;   No family history on file. History  Substance Use Topics  . Smoking status: Former Smoker    Types: Cigarettes    Quit date: 07/28/1974  . Smokeless tobacco: Not on file  . Alcohol Use: No    Review of Systems  Constitutional: Negative for fever and chills.  HENT: Negative.   Eyes: Negative.  Negative for visual disturbance.  Respiratory: Negative.  Negative for shortness of breath.   Cardiovascular: Negative.   Gastrointestinal: Negative.   Musculoskeletal: Negative.   Skin: Positive for wound.       Abrasion to forehead.  Neurological: Positive for dizziness, facial asymmetry, speech difficulty, weakness and light-headedness. Negative for headaches.       Per his children, he has had residual facial asymmetry since a recent episode of Bell's Palsy.  Hematological: Does not bruise/bleed easily.  Psychiatric/Behavioral: Negative for confusion.      Allergies  Review of patient's allergies indicates no known allergies.  Home Medications   Prior to Admission medications   Medication Sig Start Date End Date Taking? Authorizing Provider  ACCU-CHEK AVIVA PLUS test strip  01/27/13   Historical Provider, MD  ezetimibe (ZETIA) 10 MG tablet Take 10 mg by mouth daily.    Historical Provider, MD  finasteride (PROSCAR) 5 MG tablet Take 5 mg by mouth daily.  Historical Provider, MD  gabapentin (NEURONTIN) 100 MG capsule Take 1 capsule (100 mg total) by mouth 3 (three) times daily. 06/21/14   Bronson Ing, DPM  glipiZIDE (GLUCOTROL XL) 10 MG 24 hr tablet Take 10 mg by mouth daily.      Historical Provider, MD  GLUCOSAMINE CHONDROITIN COMPLX PO Take 2 tablets by mouth daily.      Historical Provider, MD  hydrochlorothiazide (HYDRODIURIL) 25 MG tablet Take 25 mg by mouth daily.      Historical  Provider, MD  insulin glargine (LANTUS) 100 UNIT/ML injection Inject 20-60 Units into the skin at bedtime. Dosed based on sugar levels:  50 in the morning    Historical Provider, MD  linagliptin (TRADJENTA) 5 MG TABS tablet Take 5 mg by mouth daily.    Historical Provider, MD  meloxicam (MOBIC) 7.5 MG tablet Take 7.5 mg by mouth daily.    Historical Provider, MD  metFORMIN (GLUCOPHAGE-XR) 500 MG 24 hr tablet Take 500 mg by mouth daily with breakfast.    Historical Provider, MD  Multiple Vitamins-Minerals (MULTIVITAMINS THER. W/MINERALS) TABS Take 1 tablet by mouth daily.      Historical Provider, MD  pantoprazole (PROTONIX) 20 MG tablet Take 20 mg by mouth daily.      Historical Provider, MD  silver sulfADIAZINE (SILVADENE) 1 % cream Apply 1 application topically daily. 11/09/14   Bronson Ing, DPM  tamsulosin (FLOMAX) 0.4 MG CAPS capsule  09/02/13   Historical Provider, MD   BP 108/59 mmHg  Pulse 82  Temp(Src) 98 F (36.7 C)  Resp 16  Ht 6\' 2"  (1.88 m)  Wt 220 lb (99.791 kg)  BMI 28.23 kg/m2  SpO2 95% Physical Exam  Constitutional: He is oriented to person, place, and time. He appears well-developed and well-nourished. No distress.  HENT:  Head: Normocephalic.  Small hematoma to right forehead.  Eyes: Conjunctivae are normal.  Pupils pinpoint bilaterally.  Neck: Normal range of motion. Neck supple.  Cardiovascular: Normal rate and regular rhythm.   No murmur heard. No carotid bruit.  Pulmonary/Chest: Effort normal and breath sounds normal. He has no wheezes. He has no rales. He exhibits no tenderness.  Abdominal: Soft. Bowel sounds are normal. There is no tenderness. There is no rebound and no guarding.  Musculoskeletal: Normal range of motion. He exhibits no edema.  Neurological: He is alert and oriented to person, place, and time. Coordination normal.  Skin: Skin is warm and dry. No rash noted.  Psychiatric: He has a normal mood and affect.    ED Course  Procedures  (including critical care time) Labs Review Labs Reviewed  URINE CULTURE  CBC WITH DIFFERENTIAL  COMPREHENSIVE METABOLIC PANEL  URINALYSIS, ROUTINE W REFLEX MICROSCOPIC  TROPONIN I   Results for orders placed or performed during the hospital encounter of 11/18/14  CBC with Differential  Result Value Ref Range   WBC 7.0 4.0 - 10.5 K/uL   RBC 4.52 4.22 - 5.81 MIL/uL   Hemoglobin 14.2 13.0 - 17.0 g/dL   HCT 41.1 39.0 - 52.0 %   MCV 90.9 78.0 - 100.0 fL   MCH 31.4 26.0 - 34.0 pg   MCHC 34.5 30.0 - 36.0 g/dL   RDW 12.6 11.5 - 15.5 %   Platelets 217 150 - 400 K/uL   Neutrophils Relative % 73 43 - 77 %   Neutro Abs 5.1 1.7 - 7.7 K/uL   Lymphocytes Relative 19 12 - 46 %   Lymphs Abs 1.3 0.7 -  4.0 K/uL   Monocytes Relative 6 3 - 12 %   Monocytes Absolute 0.4 0.1 - 1.0 K/uL   Eosinophils Relative 2 0 - 5 %   Eosinophils Absolute 0.1 0.0 - 0.7 K/uL   Basophils Relative 0 0 - 1 %   Basophils Absolute 0.0 0.0 - 0.1 K/uL  Comprehensive metabolic panel  Result Value Ref Range   Sodium 138 137 - 147 mEq/L   Potassium 4.0 3.7 - 5.3 mEq/L   Chloride 98 96 - 112 mEq/L   CO2 26 19 - 32 mEq/L   Glucose, Bld 248 (H) 70 - 99 mg/dL   BUN 15 6 - 23 mg/dL   Creatinine, Ser 0.69 0.50 - 1.35 mg/dL   Calcium 9.6 8.4 - 10.5 mg/dL   Total Protein 6.7 6.0 - 8.3 g/dL   Albumin 3.7 3.5 - 5.2 g/dL   AST 22 0 - 37 U/L   ALT 16 0 - 53 U/L   Alkaline Phosphatase 104 39 - 117 U/L   Total Bilirubin 0.5 0.3 - 1.2 mg/dL   GFR calc non Af Amer 84 (L) >90 mL/min   GFR calc Af Amer >90 >90 mL/min   Anion gap 14 5 - 15  Troponin I  Result Value Ref Range   Troponin I <0.30 <0.30 ng/mL   Dg Chest 2 View  11/18/2014   CLINICAL DATA:  Dizzy, fall.  Slurred speech  EXAM: CHEST  2 VIEW  COMPARISON:  Radiograph 02/21/2014  FINDINGS: Normal cardiac silhouette. Right-sided pulmonary scarring again demonstrated and not changed from CT 08/05/2012. Lungs are hyperinflated. Degenerate spurring of the spine.   IMPRESSION: 1. No acute cardiopulmonary findings. 2. Chronic scarring and nodularity in the right upper lobe.   Electronically Signed   By: Suzy Bouchard M.D.   On: 11/18/2014 21:54   Ct Head Wo Contrast  11/18/2014   CLINICAL DATA:  Dizziness with fall. Mild headache and right forehead bruising. Initial encounter  EXAM: CT HEAD WITHOUT CONTRAST  CT CERVICAL SPINE WITHOUT CONTRAST  TECHNIQUE: Multidetector CT imaging of the head and cervical spine was performed following the standard protocol without intravenous contrast. Multiplanar CT image reconstructions of the cervical spine were also generated.  COMPARISON:  None.  FINDINGS: CT HEAD FINDINGS  Skull and Sinuses:Right forehead contusion.  No underlying fracture.  The mastoids and paranasal sinuses are essentially clear.  Orbits: No traumatic findings.  Bilateral cataract resection.  Brain: No evidence of acute abnormality, such as acute infarction, hemorrhage, hydrocephalus, or mass lesion/mass effect. There is generalized brain atrophy. Tortuous vertebrobasilar system, usually indicating chronic hypertension.  CT CERVICAL SPINE FINDINGS  No acute fracture or malalignment. No gross cervical canal hematoma or prevertebral edema.  Advanced degenerative disc disease throughout the cervical spine. Short pedicles, posterior disc osteophyte complexes, and posterior ligamentous thickening cause spinal canal stenosis from C3-4 to C5-6.  IMPRESSION: 1. No acute intracranial or cervical spine finding. 2. Generalized brain atrophy. 3. Degenerative disc disease with mid cervical canal stenosis.   Electronically Signed   By: Jorje Guild M.D.   On: 11/18/2014 23:54   Ct Cervical Spine Wo Contrast  11/18/2014   CLINICAL DATA:  Dizziness with fall. Mild headache and right forehead bruising. Initial encounter  EXAM: CT HEAD WITHOUT CONTRAST  CT CERVICAL SPINE WITHOUT CONTRAST  TECHNIQUE: Multidetector CT imaging of the head and cervical spine was performed  following the standard protocol without intravenous contrast. Multiplanar CT image reconstructions of the cervical spine were also generated.  COMPARISON:  None.  FINDINGS: CT HEAD FINDINGS  Skull and Sinuses:Right forehead contusion.  No underlying fracture.  The mastoids and paranasal sinuses are essentially clear.  Orbits: No traumatic findings.  Bilateral cataract resection.  Brain: No evidence of acute abnormality, such as acute infarction, hemorrhage, hydrocephalus, or mass lesion/mass effect. There is generalized brain atrophy. Tortuous vertebrobasilar system, usually indicating chronic hypertension.  CT CERVICAL SPINE FINDINGS  No acute fracture or malalignment. No gross cervical canal hematoma or prevertebral edema.  Advanced degenerative disc disease throughout the cervical spine. Short pedicles, posterior disc osteophyte complexes, and posterior ligamentous thickening cause spinal canal stenosis from C3-4 to C5-6.  IMPRESSION: 1. No acute intracranial or cervical spine finding. 2. Generalized brain atrophy. 3. Degenerative disc disease with mid cervical canal stenosis.   Electronically Signed   By: Jorje Guild M.D.   On: 11/18/2014 23:54    Imaging Review No results found.   EKG Interpretation   Date/Time:  Sunday November 18 2014 20:21:17 EST Ventricular Rate:  84 PR Interval:  190 QRS Duration: 101 QT Interval:  372 QTC Calculation: 440 R Axis:   -46 Text Interpretation:  Sinus rhythm Abnormal R-wave progression, early  transition Inferior infarct, old Q waves inferiorly, new Confirmed by  Mingo Amber  MD, [place] ([id number]) on 11/18/2014 8:45:17 PM      MDM   Final diagnoses:  Dizzy    1. TIA 2. Dizziness  He has been asymptomatic while in ED. No further aphasia. He has been evaluated by Dr. Mingo Amber. CT head and neck negative for acute finding. Lab studies essentially WNL. UA pending.  Given patient's symptoms of lightheadedness/dizziness causing multiple falls, and transient  slurred speech earlier today, will admit for TIA work up. Discussed care plan with family and patient who are in agreement regarding admission. Hospitalist paged for admission.    Dewaine Oats, PA-C 11/19/14 0021  Evelina Bucy, MD 11/20/14 [phone]

## 2014-11-18 NOTE — ED Notes (Signed)
Pt was unable to urinate for sample. Pt is aware sample is needed. Urinal at bedside.

## 2014-11-18 NOTE — ED Notes (Signed)
Transporter advised that CT tech felt pt was not acting normal; Pt seen back in room by RN; Neuro check done; Pt seems to be the same as when he came in; Pt able to hold conversation with RN; No noted changes neurologically.

## 2014-11-18 NOTE — ED Notes (Signed)
PA at bedside.

## 2014-11-19 ENCOUNTER — Encounter (HOSPITAL_COMMUNITY): Payer: Self-pay | Admitting: *Deleted

## 2014-11-19 ENCOUNTER — Observation Stay (HOSPITAL_COMMUNITY): Payer: Medicare Other

## 2014-11-19 DIAGNOSIS — I358 Other nonrheumatic aortic valve disorders: Secondary | ICD-10-CM | POA: Diagnosis present

## 2014-11-19 DIAGNOSIS — I6339 Cerebral infarction due to thrombosis of other cerebral artery: Secondary | ICD-10-CM | POA: Diagnosis not present

## 2014-11-19 DIAGNOSIS — K219 Gastro-esophageal reflux disease without esophagitis: Secondary | ICD-10-CM | POA: Diagnosis present

## 2014-11-19 DIAGNOSIS — G459 Transient cerebral ischemic attack, unspecified: Secondary | ICD-10-CM

## 2014-11-19 DIAGNOSIS — G8190 Hemiplegia, unspecified affecting unspecified side: Secondary | ICD-10-CM

## 2014-11-19 DIAGNOSIS — I2510 Atherosclerotic heart disease of native coronary artery without angina pectoris: Secondary | ICD-10-CM | POA: Diagnosis not present

## 2014-11-19 DIAGNOSIS — I6350 Cerebral infarction due to unspecified occlusion or stenosis of unspecified cerebral artery: Secondary | ICD-10-CM | POA: Diagnosis not present

## 2014-11-19 DIAGNOSIS — R29898 Other symptoms and signs involving the musculoskeletal system: Secondary | ICD-10-CM | POA: Diagnosis present

## 2014-11-19 DIAGNOSIS — E119 Type 2 diabetes mellitus without complications: Secondary | ICD-10-CM

## 2014-11-19 DIAGNOSIS — I63039 Cerebral infarction due to thrombosis of unspecified carotid artery: Secondary | ICD-10-CM

## 2014-11-19 DIAGNOSIS — I639 Cerebral infarction, unspecified: Secondary | ICD-10-CM | POA: Diagnosis present

## 2014-11-19 DIAGNOSIS — I69391 Dysphagia following cerebral infarction: Secondary | ICD-10-CM | POA: Diagnosis not present

## 2014-11-19 DIAGNOSIS — E118 Type 2 diabetes mellitus with unspecified complications: Secondary | ICD-10-CM

## 2014-11-19 DIAGNOSIS — R296 Repeated falls: Secondary | ICD-10-CM | POA: Diagnosis not present

## 2014-11-19 DIAGNOSIS — I6789 Other cerebrovascular disease: Secondary | ICD-10-CM

## 2014-11-19 DIAGNOSIS — C3490 Malignant neoplasm of unspecified part of unspecified bronchus or lung: Secondary | ICD-10-CM | POA: Diagnosis not present

## 2014-11-19 DIAGNOSIS — Z85110 Personal history of malignant carcinoid tumor of bronchus and lung: Secondary | ICD-10-CM | POA: Diagnosis not present

## 2014-11-19 DIAGNOSIS — Z955 Presence of coronary angioplasty implant and graft: Secondary | ICD-10-CM | POA: Diagnosis not present

## 2014-11-19 DIAGNOSIS — E1140 Type 2 diabetes mellitus with diabetic neuropathy, unspecified: Secondary | ICD-10-CM | POA: Diagnosis present

## 2014-11-19 DIAGNOSIS — Z902 Acquired absence of lung [part of]: Secondary | ICD-10-CM | POA: Diagnosis present

## 2014-11-19 DIAGNOSIS — I359 Nonrheumatic aortic valve disorder, unspecified: Secondary | ICD-10-CM | POA: Diagnosis not present

## 2014-11-19 DIAGNOSIS — R42 Dizziness and giddiness: Secondary | ICD-10-CM | POA: Diagnosis not present

## 2014-11-19 DIAGNOSIS — S0990XA Unspecified injury of head, initial encounter: Secondary | ICD-10-CM | POA: Diagnosis not present

## 2014-11-19 DIAGNOSIS — Z87891 Personal history of nicotine dependence: Secondary | ICD-10-CM | POA: Diagnosis not present

## 2014-11-19 DIAGNOSIS — E785 Hyperlipidemia, unspecified: Secondary | ICD-10-CM | POA: Diagnosis present

## 2014-11-19 DIAGNOSIS — R269 Unspecified abnormalities of gait and mobility: Secondary | ICD-10-CM | POA: Diagnosis present

## 2014-11-19 DIAGNOSIS — M5030 Other cervical disc degeneration, unspecified cervical region: Secondary | ICD-10-CM | POA: Diagnosis present

## 2014-11-19 DIAGNOSIS — G8191 Hemiplegia, unspecified affecting right dominant side: Secondary | ICD-10-CM | POA: Diagnosis present

## 2014-11-19 DIAGNOSIS — Z794 Long term (current) use of insulin: Secondary | ICD-10-CM | POA: Diagnosis not present

## 2014-11-19 DIAGNOSIS — I10 Essential (primary) hypertension: Secondary | ICD-10-CM | POA: Diagnosis not present

## 2014-11-19 DIAGNOSIS — N400 Enlarged prostate without lower urinary tract symptoms: Secondary | ICD-10-CM | POA: Diagnosis present

## 2014-11-19 DIAGNOSIS — I63119 Cerebral infarction due to embolism of unspecified vertebral artery: Secondary | ICD-10-CM

## 2014-11-19 DIAGNOSIS — I6302 Cerebral infarction due to thrombosis of basilar artery: Secondary | ICD-10-CM | POA: Diagnosis not present

## 2014-11-19 LAB — URINALYSIS, ROUTINE W REFLEX MICROSCOPIC
BILIRUBIN URINE: NEGATIVE
KETONES UR: 15 mg/dL — AB
Leukocytes, UA: NEGATIVE
NITRITE: NEGATIVE
Protein, ur: NEGATIVE mg/dL
Specific Gravity, Urine: 1.026 (ref 1.005–1.030)
Urobilinogen, UA: 1 mg/dL (ref 0.0–1.0)
pH: 6.5 (ref 5.0–8.0)

## 2014-11-19 LAB — COMPREHENSIVE METABOLIC PANEL
ALT: 15 U/L (ref 0–53)
AST: 18 U/L (ref 0–37)
Albumin: 3.6 g/dL (ref 3.5–5.2)
Alkaline Phosphatase: 98 U/L (ref 39–117)
Anion gap: 11 (ref 5–15)
BILIRUBIN TOTAL: 0.7 mg/dL (ref 0.3–1.2)
BUN: 14 mg/dL (ref 6–23)
CALCIUM: 8.9 mg/dL (ref 8.4–10.5)
CHLORIDE: 101 meq/L (ref 96–112)
CO2: 29 meq/L (ref 19–32)
Creatinine, Ser: 0.72 mg/dL (ref 0.50–1.35)
GFR calc Af Amer: >90 mL/min (ref >90)
GFR, EST NON AFRICAN AMERICAN: 82 mL/min — AB (ref >90)
Glucose, Bld: 166 mg/dL — ABNORMAL HIGH (ref 70–99)
Potassium: 4 mEq/L (ref 3.7–5.3)
SODIUM: 141 meq/L (ref 137–147)
Total Protein: 6.6 g/dL (ref 6.0–8.3)

## 2014-11-19 LAB — CBC WITH DIFFERENTIAL/PLATELET
BASOS PCT: 0 % (ref 0–1)
Basophils Absolute: 0 10*3/uL (ref 0.0–0.1)
Eosinophils Absolute: 0.1 10*3/uL (ref 0.0–0.7)
Eosinophils Relative: 2 % (ref 0–5)
HCT: 40.7 % (ref 39.0–52.0)
Hemoglobin: 13.9 g/dL (ref 13.0–17.0)
LYMPHS PCT: 28 % (ref 12–46)
Lymphs Abs: 1.8 10*3/uL (ref 0.7–4.0)
MCH: 31.9 pg (ref 26.0–34.0)
MCHC: 34.2 g/dL (ref 30.0–36.0)
MCV: 93.3 fL (ref 78.0–100.0)
Monocytes Absolute: 0.6 10*3/uL (ref 0.1–1.0)
Monocytes Relative: 9 % (ref 3–12)
Neutro Abs: 3.9 10*3/uL (ref 1.7–7.7)
Neutrophils Relative %: 61 % (ref 43–77)
PLATELETS: 225 10*3/uL (ref 150–400)
RBC: 4.36 MIL/uL (ref 4.22–5.81)
RDW: 12.5 % (ref 11.5–15.5)
WBC: 6.4 10*3/uL (ref 4.0–10.5)

## 2014-11-19 LAB — GLUCOSE, CAPILLARY
GLUCOSE-CAPILLARY: 232 mg/dL — AB (ref 70–99)
Glucose-Capillary: 212 mg/dL — ABNORMAL HIGH (ref 70–99)
Glucose-Capillary: 274 mg/dL — ABNORMAL HIGH (ref 70–99)
Glucose-Capillary: 239 mg/dL — ABNORMAL HIGH (ref 70–99)

## 2014-11-19 LAB — RAPID URINE DRUG SCREEN, HOSP PERFORMED
Amphetamines: NOT DETECTED
Barbiturates: NOT DETECTED
Benzodiazepines: NOT DETECTED
Cocaine: NOT DETECTED
Opiates: NOT DETECTED
TETRAHYDROCANNABINOL: NOT DETECTED

## 2014-11-19 LAB — URINE MICROSCOPIC-ADD ON

## 2014-11-19 LAB — LIPID PANEL
CHOLESTEROL: 120 mg/dL (ref 0–200)
HDL: 36 mg/dL — ABNORMAL LOW (ref >39)
LDL Cholesterol: 70 mg/dL (ref 0–99)
TRIGLYCERIDES: 72 mg/dL (ref <150)
Total CHOL/HDL Ratio: 3.3 RATIO
VLDL: 14 mg/dL (ref 0–40)

## 2014-11-19 LAB — HEMOGLOBIN A1C
HEMOGLOBIN A1C: 7.9 % — AB (ref <5.7)
MEAN PLASMA GLUCOSE: 180 mg/dL — AB (ref <117)

## 2014-11-19 LAB — PROTIME-INR
INR: 1.13 (ref 0.00–1.49)
Prothrombin Time: 14.6 seconds (ref 11.6–15.2)

## 2014-11-19 LAB — CBG MONITORING, ED: Glucose-Capillary: 179 mg/dL — ABNORMAL HIGH (ref 70–99)

## 2014-11-19 MED ORDER — ENOXAPARIN SODIUM 40 MG/0.4ML ~~LOC~~ SOLN
40.0000 mg | SUBCUTANEOUS | Status: DC
Start: 2014-11-19 — End: 2014-11-20
  Administered 2014-11-19 – 2014-11-20 (×2): 40 mg via SUBCUTANEOUS
  Filled 2014-11-19 (×2): qty 0.4

## 2014-11-19 MED ORDER — STARCH (THICKENING) PO POWD
ORAL | Status: DC | PRN
  Filled 2014-11-19: qty 227

## 2014-11-19 MED ORDER — FINASTERIDE 5 MG PO TABS
5.0000 mg | ORAL_TABLET | Freq: Every day | ORAL | Status: DC
  Administered 2014-11-19 – 2014-11-20 (×2): 5 mg via ORAL
  Filled 2014-11-19 (×2): qty 1

## 2014-11-19 MED ORDER — INSULIN ASPART 100 UNIT/ML ~~LOC~~ SOLN
0.0000 [IU] | Freq: Three times a day (TID) | SUBCUTANEOUS | Status: DC
Start: 2014-11-19 — End: 2014-11-20
  Administered 2014-11-19: 8 [IU] via SUBCUTANEOUS
  Administered 2014-11-19 (×2): 5 [IU] via SUBCUTANEOUS
  Administered 2014-11-20: 8 [IU] via SUBCUTANEOUS
  Administered 2014-11-20: 3 [IU] via SUBCUTANEOUS
  Administered 2014-11-20: 2 [IU] via SUBCUTANEOUS

## 2014-11-19 MED ORDER — STROKE: EARLY STAGES OF RECOVERY BOOK
Freq: Once | Status: AC
Start: 2014-11-19 — End: 2014-11-19
  Administered 2014-11-19: 03:00:00
  Filled 2014-11-19: qty 1

## 2014-11-19 MED ORDER — INSULIN GLARGINE 100 UNIT/ML ~~LOC~~ SOLN
70.0000 [IU] | Freq: Every day | SUBCUTANEOUS | Status: DC
  Administered 2014-11-19 – 2014-11-20 (×2): 70 [IU] via SUBCUTANEOUS
  Filled 2014-11-19 (×3): qty 0.7

## 2014-11-19 MED ORDER — EZETIMIBE 10 MG PO TABS
10.0000 mg | ORAL_TABLET | Freq: Every day | ORAL | Status: DC
  Administered 2014-11-19 – 2014-11-20 (×2): 10 mg via ORAL
  Filled 2014-11-19 (×2): qty 1

## 2014-11-19 MED ORDER — ASPIRIN 325 MG PO TABS
325.0000 mg | ORAL_TABLET | Freq: Every day | ORAL | Status: DC
  Administered 2014-11-19 – 2014-11-20 (×2): 325 mg via ORAL
  Filled 2014-11-19 (×2): qty 1

## 2014-11-19 NOTE — Progress Notes (Signed)
OT Cancellation Note  Patient Details Name: Chad Fuller MRN: [id number] DOB: 04-25-1926   Cancelled Treatment:    Reason Eval/Treat Not Completed: Patient at procedure or test/ unavailable. Ot to continue to check back today as time allows  Peri Maris  Pager: [phone]  11/19/2014, 9:12 AM

## 2014-11-19 NOTE — Plan of Care (Signed)
Problem: Progression Outcomes Goal: If vent dependent, tolerates weaning Outcome: Not Applicable Date Met:  14/60/47

## 2014-11-19 NOTE — Plan of Care (Signed)
Problem: Consults Goal: Ischemic Stroke Patient Education See Patient Education Module for education specifics.  Outcome: Completed/Met Date Met:  11/19/14

## 2014-11-19 NOTE — Plan of Care (Signed)
Problem: Progression Outcomes Goal: Communication method established Outcome: Completed/Met Date Met:  11/19/14

## 2014-11-19 NOTE — Progress Notes (Signed)
VASCULAR LAB PRELIMINARY  PRELIMINARY  PRELIMINARY  PRELIMINARY  Carotid Dopplers completed.    Preliminary report:  1-39% ICA stenosis.    Chad Fuller, RVT 11/19/2014, 11:25 AM

## 2014-11-19 NOTE — ED Notes (Signed)
MD at bedside. 

## 2014-11-19 NOTE — Plan of Care (Signed)
Problem: Consults Goal: Nutrition Consult-if indicated Outcome: Completed/Met Date Met:  11/19/14 Dysphasia level 3 with nectar thick liquids

## 2014-11-19 NOTE — Plan of Care (Signed)
Problem: Consults Goal: Diabetes Guidelines if Diabetic/Glucose > 140 If diabetic or lab glucose is > 140 mg/dl - Initiate Diabetes/Hyperglycemia Guidelines & Document Interventions  Outcome: Completed/Met Date Met:  11/19/14

## 2014-11-19 NOTE — Plan of Care (Signed)
Problem: Progression Outcomes Goal: Educational plan initiated Outcome: Completed/Met Date Met:  11/19/14

## 2014-11-19 NOTE — Plan of Care (Signed)
Problem: Acute Treatment Outcomes Goal: BP within ordered parameters Outcome: Completed/Met Date Met:  11/19/14 Goal: Airway maintained/protected Outcome: Completed/Met Date Met:  11/19/14 Goal: 02 Sats > 94% Outcome: Completed/Met Date Met:  11/19/14 Goal: Hemodynamically stable Outcome: Completed/Met Date Met:  11/19/14 Goal: Prognosis discussed with family/patient as appropriate Outcome: Progressing

## 2014-11-19 NOTE — Evaluation (Signed)
Clinical/Bedside Swallow Evaluation Patient Details  Name: Chad Fuller MRN: [id number] Date of Birth: 1926-05-04  Today's Date: 11/19/2014 Time: 1340-1405 SLP Time Calculation (min) (ACUTE ONLY): 25 min  Past Medical History:  Past Medical History  Diagnosis Date  . Diabetes mellitus   . Cancer     lung  . Prostate enlargement   . History of blood clots   . CAD (coronary artery disease)   . Pulmonary embolus july 2005   Past Surgical History:  Past Surgical History  Procedure Laterality Date  . Lung removal, partial    . Coronary stent placement    . Knee surgery    . Filtering procedure      reports filter placed after knee surgery for blood clots  . I&d extremity  08/16/2012    Procedure: MINOR IRRIGATION AND DEBRIDEMENT EXTREMITY;  Surgeon: Tennis Must, MD;  Location: [facility];  Service: Orthopedics;  Laterality: Right;   HPI:  80 yo male admitted from home due to slurred speech, dizziness/ light headed, mild headache with R forehead bruise. pt s/p multiple fall. Baseline independent with ADLS PMH: DM, lung CA, CAD, PE, Lung partial removal, Knee surg, Bell's Pals. 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      Assessment / Plan / Recommendation Clinical Impression  Patient present with a neurogenic dysphagia s/p left pontine CVA with noted involvement of CN VII and XII impacting oral control of bolus and likely resulting in delays in swallow initiation and decreased airway protection. S/s of aspiration noted with thin liquid consistency only. SLP intervention including manipulation of bolus consistencies and presentation as well as verbal cueing for small single sips eliminated overt indication of aspiration at bedside. Recommend downgrading diet to maximize safety as well as FEES 11/24 to instrumentally assess function and determine ability to advance diet with use of compensatory strategies.    Patient also with notable  dysarthria at bedside. Please order cognitive-linguistic evaluation.   Aspiration Risk  Moderate    Diet Recommendation Dysphagia 3 (Mechanical Soft);Nectar-thick liquid   Liquid Administration via: Cup;No straw Medication Administration: Whole meds with puree Supervision: Patient able to self feed;Full supervision/cueing for compensatory strategies Compensations: Slow rate;Small sips/bites;Check for pocketing Postural Changes and/or Swallow Maneuvers: Seated upright 90 degrees    Other  Recommendations Recommended Consults: FEES Oral Care Recommendations: Oral care BID Other Recommendations: Order thickener from pharmacy;Prohibited food (jello, ice cream, thin soups);Remove water pitcher   Follow Up Recommendations  Inpatient Rehab       Pertinent Vitals/Pain N/a     Swallow Study    General HPI: 80 yo male admitted from home due to slurred speech, dizziness/ light headed, mild headache with R forehead bruise. pt s/p multiple fall. Baseline independent with ADLS PMH: DM, lung CA, CAD, PE, Lung partial removal, Knee surg, Bell's Pals. 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    Type of Study: Bedside swallow evaluation Previous Swallow Assessment: none Diet Prior to this Study: Regular;Thin liquids Temperature Spikes Noted: No Respiratory Status: Room air History of Recent Intubation: No Behavior/Cognition: Alert;Cooperative;Pleasant mood Oral Cavity - Dentition: Adequate natural dentition Self-Feeding Abilities: Able to feed self Patient Positioning: Upright in bed Baseline Vocal Quality: Clear Volitional Cough: Weak Volitional Swallow: Able to elicit    Oral/Motor/Sensory Function Overall Oral Motor/Sensory Function: Impaired Labial ROM: Reduced right Labial Symmetry: Abnormal symmetry right Labial Strength: Reduced Labial Sensation: Within Functional Limits Lingual ROM: Reduced right  Lingual Symmetry: Abnormal symmetry right Lingual  Strength: Reduced Lingual Sensation: Within Functional Limits Facial ROM: Reduced right Facial Symmetry: Right droop Facial Strength: Reduced Facial Sensation: Within Functional Limits Velum: Within Functional Limits Mandible: Within Functional Limits   Ice Chips Ice chips: Within functional limits Presentation: Spoon   Thin Liquid Thin Liquid: Impaired Presentation: Cup;Self Fed Oral Phase Impairments: Reduced labial seal Oral Phase Functional Implications: Right anterior spillage Pharyngeal  Phase Impairments: Suspected delayed Swallow;Cough - Immediate    Nectar Thick Nectar Thick Liquid: Within functional limits Presentation: Cup;Self Fed   Honey Thick Honey Thick Liquid: Not tested   Puree Puree: Within functional limits Presentation: Self Fed   Solid   GO   Chad Newsham MA, CCC-SLP [phone]  Solid: Impaired Presentation: Self Fed Oral Phase Impairments: Impaired anterior to posterior transit;Reduced lingual movement/coordination Oral Phase Functional Implications: Oral residue       Chad Fuller 11/19/2014,2:43 PM

## 2014-11-19 NOTE — Consult Note (Signed)
Consult Reason for Consult:slurred speech Referring Physician: Dr Posey Pronto  CC: slurred speech  HPI: NAHUN KRONBERG is an 80 y.o. male presenting for evaluation of slurred speech and gait instability. His son called him around 3pm yesterday, noted that his speech was very slurred, difficult to understand, notes his dad seemed to have difficulty getting his words out. Patient notes around that time he also noticed increased difficulty walking, felt unsteady, had a fall. These symptoms have persisted.   He has been having dizziness (described as feeling off balance) and gait instability for the past few weeks. The dizziness started around being started on gabapentin for diabetic neuropathy. Stopped around 2 weeks ago and symptoms improved. Notes current symptoms are worse than these prior symptoms.   He has history of DM, PE (not on anticoagulation)  Last known normal: 11/18/2014 at 1500. tPA not given as outside the therapeutic window.  Past Medical History  Diagnosis Date  . Diabetes mellitus   . Cancer     lung  . Prostate enlargement   . History of blood clots   . CAD (coronary artery disease)   . Pulmonary embolus july 2005    Past Surgical History  Procedure Laterality Date  . Lung removal, partial    . Coronary stent placement    . Knee surgery    . Filtering procedure      reports filter placed after knee surgery for blood clots  . I&d extremity  08/16/2012    Procedure: MINOR IRRIGATION AND DEBRIDEMENT EXTREMITY;  Surgeon: Tennis Must, MD;  Location: [facility];  Service: Orthopedics;  Laterality: Right;    No family history on file.  Social History:  reports that he quit smoking about 40 years ago. His smoking use included Cigarettes. He smoked 0.00 packs per day. He does not have any smokeless tobacco history on file. He reports that he does not drink alcohol or use illicit drugs.  No Known Allergies  Medications:  Scheduled: . enoxaparin (LOVENOX)  injection  40 mg Subcutaneous Q24H  . ezetimibe  10 mg Oral Daily  . finasteride  5 mg Oral Daily  . insulin aspart  0-15 Units Subcutaneous TID WC  . insulin glargine  70 Units Subcutaneous Daily    Head CT images reviewed and were unremarkable  ROS: Out of a complete 14 system review, the patient complains of only the following symptoms, and all other reviewed systems are negative. + gait instability, slurred speech  Physical Examination: Filed Vitals:   11/19/14 0030  BP: 144/82  Pulse: 74  Temp:   Resp: 15   Physical Exam  Constitutional: He appears well-developed and well-nourished.  Psych: Affect appropriate to situation Eyes: No scleral injection HENT: No OP obstrucion Head: Normocephalic.  Cardiovascular: Normal rate and regular rhythm.  Respiratory: Effort normal and breath sounds normal.  GI: Soft. Bowel sounds are normal. No distension. There is no tenderness.  Skin: WDI  Neurologic Examination Mental Status: Alert, oriented, thought content appropriate.  Speech fluent without evidence of aphasia. Moderate dysarthria noted.  Able to follow 3 step commands without difficulty. Cranial Nerves: II: funduscopic exam wnl bilaterally, visual fields grossly normal, pupils equal, round, reactive to light and accommodation III,IV, VI: ptosis not present, extra-ocular motions intact bilaterally V,VII: mild flattening R NLF, facial light touch sensation normal bilaterally VIII: hearing normal bilaterally IX,X: gag reflex present XI: trapezius strength/neck flexion strength normal bilaterally XII: tongue strength normal  Motor: Right : Upper extremity  Left:     Upper extremity 4+/5 deltoid       5/5 deltoid 5-/5 biceps      5/5 biceps  5-/5 triceps      5/5 triceps 5-/5 hand grip      5/5 hand grip  Lower extremity     Lower extremity 4+/5 hip flexor      5/5 hip flexor 5-/5 quadricep      5/5 quadriceps  5/5 hamstrings     5/5 hamstrings 5/5 plantar flexion        5/5 plantar flexion 5/5 plantar extension     5/5 plantar extension Tone and bulk:normal tone throughout; no atrophy noted Sensory: Pinprick and light touch intact throughout, bilaterally Deep Tendon Reflexes: 2+ and symmetric throughout Plantars: Right: downgoing   Left: downgoing Cerebellar: normal finger-to-nose, normal rapid alternating movements and normal heel-to-shin test Gait: deferred due to multiple leads  Laboratory Studies:   Basic Metabolic Panel:  Recent Labs Lab 11/18/14 2045  NA 138  K 4.0  CL 98  CO2 26  GLUCOSE 248*  BUN 15  CREATININE 0.69  CALCIUM 9.6    Liver Function Tests:  Recent Labs Lab 11/18/14 2045  AST 22  ALT 16  ALKPHOS 104  BILITOT 0.5  PROT 6.7  ALBUMIN 3.7   No results for input(s): LIPASE, AMYLASE in the last 168 hours. No results for input(s): AMMONIA in the last 168 hours.  CBC:  Recent Labs Lab 11/18/14 2045  WBC 7.0  NEUTROABS 5.1  HGB 14.2  HCT 41.1  MCV 90.9  PLT 217    Cardiac Enzymes:  Recent Labs Lab 11/18/14 2045  TROPONINI <0.30    BNP: Invalid input(s): POCBNP  CBG:  Recent Labs Lab 11/19/14 0105  GLUCAP 179*    Microbiology: Results for orders placed or performed during the hospital encounter of 08/16/12  AFB culture with smear     Status: None   Collection Time: 08/16/12  2:42 PM  Result Value Ref Range Status   Specimen Description WOUND HAND RIGHT  Final   Special Requests RIGHT THUMB  Final   Acid Fast Smear NO ACID FAST BACILLI SEEN  Final   Culture NO ACID FAST BACILLI ISOLATED IN 6 WEEKS  Final   Report Status 09/28/2012 FINAL  Final  Anaerobic culture     Status: None   Collection Time: 08/16/12  2:42 PM  Result Value Ref Range Status   Specimen Description WOUND HAND RIGHT  Final   Special Requests RIGHT THUMB  Final   Gram Stain   Final    FEW WBC PRESENT,BOTH PMN AND MONONUCLEAR FEW SQUAMOUS EPITHELIAL CELLS PRESENT NO ORGANISMS SEEN   Culture NO ANAEROBES  ISOLATED  Final   Report Status 08/21/2012 FINAL  Final  Fungus culture w smear     Status: None   Collection Time: 08/16/12  2:42 PM  Result Value Ref Range Status   Specimen Description WOUND HAND RIGHT  Final   Special Requests RIGHT THUMB  Final   Fungal Smear NO YEAST OR FUNGAL ELEMENTS SEEN  Final   Culture No Fungi Isolated in 4 Weeks  Final   Report Status 09/12/2012 FINAL  Final  Wound culture     Status: None   Collection Time: 08/16/12  2:42 PM  Result Value Ref Range Status   Specimen Description WOUND HAND RIGHT  Final   Special Requests RIGHT THUMB  Final   Gram Stain   Final    FEW  WBC PRESENT, PREDOMINANTLY PMN FEW SQUAMOUS EPITHELIAL CELLS PRESENT NO ORGANISMS SEEN   Culture   Final    MULTIPLE ORGANISMS PRESENT, NONE PREDOMINANT Note: NO STAPHYLOCOCCUS AUREUS ISOLATED NO GROUP A STREP (S.PYOGENES) ISOLATED   Report Status 08/18/2012 FINAL  Final    Coagulation Studies: No results for input(s): LABPROT, INR in the last 72 hours.  Urinalysis: No results for input(s): COLORURINE, LABSPEC, PHURINE, GLUCOSEU, HGBUR, BILIRUBINUR, KETONESUR, PROTEINUR, UROBILINOGEN, NITRITE, LEUKOCYTESUR in the last 168 hours.  Invalid input(s): APPERANCEUR  Lipid Panel:  No results found for: CHOL, TRIG, HDL, CHOLHDL, VLDL, LDLCALC  HgbA1C: No results found for: HGBA1C  Urine Drug Screen:  No results found for: LABOPIA, COCAINSCRNUR, LABBENZ, AMPHETMU, THCU, LABBARB  Alcohol Level: No results for input(s): ETH in the last 168 hours.  Other results: EKG: Normal sinus rhythm   Imaging: Dg Chest 2 View  11/18/2014   CLINICAL DATA:  Dizzy, fall.  Slurred speech  EXAM: CHEST  2 VIEW  COMPARISON:  Radiograph 02/21/2014  FINDINGS: Normal cardiac silhouette. Right-sided pulmonary scarring again demonstrated and not changed from CT 08/05/2012. Lungs are hyperinflated. Degenerate spurring of the spine.  IMPRESSION: 1. No acute cardiopulmonary findings. 2. Chronic scarring and  nodularity in the right upper lobe.   Electronically Signed   By: Suzy Bouchard M.D.   On: 11/18/2014 21:54   Ct Head Wo Contrast  11/18/2014   CLINICAL DATA:  Dizziness with fall. Mild headache and right forehead bruising. Initial encounter  EXAM: CT HEAD WITHOUT CONTRAST  CT CERVICAL SPINE WITHOUT CONTRAST  TECHNIQUE: Multidetector CT imaging of the head and cervical spine was performed following the standard protocol without intravenous contrast. Multiplanar CT image reconstructions of the cervical spine were also generated.  COMPARISON:  None.  FINDINGS: CT HEAD FINDINGS  Skull and Sinuses:Right forehead contusion.  No underlying fracture.  The mastoids and paranasal sinuses are essentially clear.  Orbits: No traumatic findings.  Bilateral cataract resection.  Brain: No evidence of acute abnormality, such as acute infarction, hemorrhage, hydrocephalus, or mass lesion/mass effect. There is generalized brain atrophy. Tortuous vertebrobasilar system, usually indicating chronic hypertension.  CT CERVICAL SPINE FINDINGS  No acute fracture or malalignment. No gross cervical canal hematoma or prevertebral edema.  Advanced degenerative disc disease throughout the cervical spine. Short pedicles, posterior disc osteophyte complexes, and posterior ligamentous thickening cause spinal canal stenosis from C3-4 to C5-6.  IMPRESSION: 1. No acute intracranial or cervical spine finding. 2. Generalized brain atrophy. 3. Degenerative disc disease with mid cervical canal stenosis.   Electronically Signed   By: Jorje Guild M.D.   On: 11/18/2014 23:54   Ct Cervical Spine Wo Contrast  11/18/2014   CLINICAL DATA:  Dizziness with fall. Mild headache and right forehead bruising. Initial encounter  EXAM: CT HEAD WITHOUT CONTRAST  CT CERVICAL SPINE WITHOUT CONTRAST  TECHNIQUE: Multidetector CT imaging of the head and cervical spine was performed following the standard protocol without intravenous contrast. Multiplanar CT  image reconstructions of the cervical spine were also generated.  COMPARISON:  None.  FINDINGS: CT HEAD FINDINGS  Skull and Sinuses:Right forehead contusion.  No underlying fracture.  The mastoids and paranasal sinuses are essentially clear.  Orbits: No traumatic findings.  Bilateral cataract resection.  Brain: No evidence of acute abnormality, such as acute infarction, hemorrhage, hydrocephalus, or mass lesion/mass effect. There is generalized brain atrophy. Tortuous vertebrobasilar system, usually indicating chronic hypertension.  CT CERVICAL SPINE FINDINGS  No acute fracture or malalignment. No gross cervical canal hematoma  or prevertebral edema.  Advanced degenerative disc disease throughout the cervical spine. Short pedicles, posterior disc osteophyte complexes, and posterior ligamentous thickening cause spinal canal stenosis from C3-4 to C5-6.  IMPRESSION: 1. No acute intracranial or cervical spine finding. 2. Generalized brain atrophy. 3. Degenerative disc disease with mid cervical canal stenosis.   Electronically Signed   By: Jorje Guild M.D.   On: 11/18/2014 23:54     Assessment/Plan:  80y/o gentleman with history of DM presenting for evaluation of slurred speech and gait instability. Exam pertinent for moderate dysarthria and mild right sided weakness. Concern is for a ischemic infarct, likely small vessel disease based on the presentation.  1. HgbA1c, fasting lipid panel 2. MRI, MRA  of the brain without contrast 3. Frequent neuro checks 4. Echocardiogram 5. Carotid dopplers 6. Prophylactic therapy-Antiplatelet med: Aspirin - dose 325mg  PO  7. Risk factor modification 8. Telemetry monitoring 9. PT consult, OT consult, Speech consult 10. NPO until RN stroke swallow screen   Jim Like, DO [facility] [phone]  If 7pm- 7am, please page neurology on call as listed in [facility]. 11/19/2014, 2:02 AM

## 2014-11-19 NOTE — Progress Notes (Signed)
STROKE TEAM PROGRESS NOTE   HISTORY Chad Fuller is an 80 y.o. male presenting for evaluation of slurred speech and gait instability. His son called him around 3pm yesterday 11/18/2014, noted that his speech was very slurred, difficult to understand, notes his dad seemed to have difficulty getting his words out. Patient notes around that time he also noticed increased difficulty walking, felt unsteady, had a fall. These symptoms have persisted.   He has been having dizziness (described as feeling off balance) and gait instability for the past few weeks. The dizziness started around being started on gabapentin for diabetic neuropathy. Stopped around 2 weeks ago and symptoms improved. Notes current symptoms are worse than these prior symptoms.   He has history of DM, PE (not on anticoagulation)  Patient was not administered TPA secondary to  outside the therapeutic window. He was admitted for further evaluation and treatment.   SUBJECTIVE (INTERVAL HISTORY) No family is at the bedside.  Overall he feels his condition is stable. He is sitting up in the chair at the bedside. Patient lives alone PTA, his wife had died.    OBJECTIVE Temp:  [97.5 F (36.4 C)-98.1 F (36.7 C)] 97.5 F (36.4 C) (11/23 0600) Pulse Rate:  [74-127] 84 (11/23 0600) Cardiac Rhythm:  [-] Normal sinus rhythm (11/22 2022) Resp:  [15-20] 20 (11/23 0600) BP: (108-155)/(59-108) 153/87 mmHg (11/23 0600) SpO2:  [86 %-97 %] 96 % (11/23 0600) Weight:  [99.791 kg (220 lb)] 99.791 kg (220 lb) (11/22 2003)   Recent Labs Lab 11/19/14 0105 11/19/14 0702  GLUCAP 179* 212*    Recent Labs Lab 11/18/14 2045 11/19/14 0911  NA 138 141  K 4.0 4.0  CL 98 101  CO2 26 29  GLUCOSE 248* 166*  BUN 15 14  CREATININE 0.69 0.72  CALCIUM 9.6 8.9    Recent Labs Lab 11/18/14 2045 11/19/14 0911  AST 22 18  ALT 16 15  ALKPHOS 104 98  BILITOT 0.5 0.7  PROT 6.7 6.6  ALBUMIN 3.7 3.6    Recent Labs Lab 11/18/14 2045  11/19/14 0911  WBC 7.0 6.4  NEUTROABS 5.1 3.9  HGB 14.2 13.9  HCT 41.1 40.7  MCV 90.9 93.3  PLT 217 225    Recent Labs Lab 11/18/14 2045  TROPONINI <0.30    Recent Labs  11/19/14 0911  LABPROT 14.6  INR 1.13    Recent Labs  11/19/14 0440  COLORURINE YELLOW  LABSPEC 1.026  PHURINE 6.5  GLUCOSEU >1000*  HGBUR SMALL*  BILIRUBINUR NEGATIVE  KETONESUR 15*  PROTEINUR NEGATIVE  UROBILINOGEN 1.0  NITRITE NEGATIVE  LEUKOCYTESUR NEGATIVE       Component Value Date/Time   CHOL 120 11/19/2014 0710   TRIG 72 11/19/2014 0710   HDL 36* 11/19/2014 0710   CHOLHDL 3.3 11/19/2014 0710   VLDL 14 11/19/2014 0710   LDLCALC 70 11/19/2014 0710   No results found for: HGBA1C    Component Value Date/Time   LABOPIA NONE DETECTED 11/19/2014 0440   COCAINSCRNUR NONE DETECTED 11/19/2014 0440   LABBENZ NONE DETECTED 11/19/2014 0440   AMPHETMU NONE DETECTED 11/19/2014 0440   THCU NONE DETECTED 11/19/2014 0440   LABBARB NONE DETECTED 11/19/2014 0440    No results for input(s): ETH in the last 168 hours.  Dg Chest 2 View  11/18/2014   CLINICAL DATA:  Dizzy, fall.  Slurred speech  EXAM: CHEST  2 VIEW  COMPARISON:  Radiograph 02/21/2014  FINDINGS: Normal cardiac silhouette. Right-sided pulmonary scarring again demonstrated and  not changed from CT 08/05/2012. Lungs are hyperinflated. Degenerate spurring of the spine.  IMPRESSION: 1. No acute cardiopulmonary findings. 2. Chronic scarring and nodularity in the right upper lobe.   Electronically Signed   By: Suzy Bouchard M.D.   On: 11/18/2014 21:54   Ct Head Wo Contrast  11/18/2014   CLINICAL DATA:  Dizziness with fall. Mild headache and right forehead bruising. Initial encounter  EXAM: CT HEAD WITHOUT CONTRAST  CT CERVICAL SPINE WITHOUT CONTRAST  TECHNIQUE: Multidetector CT imaging of the head and cervical spine was performed following the standard protocol without intravenous contrast. Multiplanar CT image reconstructions of the  cervical spine were also generated.  COMPARISON:  None.  FINDINGS: CT HEAD FINDINGS  Skull and Sinuses:Right forehead contusion.  No underlying fracture.  The mastoids and paranasal sinuses are essentially clear.  Orbits: No traumatic findings.  Bilateral cataract resection.  Brain: No evidence of acute abnormality, such as acute infarction, hemorrhage, hydrocephalus, or mass lesion/mass effect. There is generalized brain atrophy. Tortuous vertebrobasilar system, usually indicating chronic hypertension.  CT CERVICAL SPINE FINDINGS  No acute fracture or malalignment. No gross cervical canal hematoma or prevertebral edema.  Advanced degenerative disc disease throughout the cervical spine. Short pedicles, posterior disc osteophyte complexes, and posterior ligamentous thickening cause spinal canal stenosis from C3-4 to C5-6.  IMPRESSION: 1. No acute intracranial or cervical spine finding. 2. Generalized brain atrophy. 3. Degenerative disc disease with mid cervical canal stenosis.   Electronically Signed   By: Jorje Guild M.D.   On: 11/18/2014 23:54   Ct Cervical Spine Wo Contrast  11/18/2014   CLINICAL DATA:  Dizziness with fall. Mild headache and right forehead bruising. Initial encounter  EXAM: CT HEAD WITHOUT CONTRAST  CT CERVICAL SPINE WITHOUT CONTRAST  TECHNIQUE: Multidetector CT imaging of the head and cervical spine was performed following the standard protocol without intravenous contrast. Multiplanar CT image reconstructions of the cervical spine were also generated.  COMPARISON:  None.  FINDINGS: CT HEAD FINDINGS  Skull and Sinuses:Right forehead contusion.  No underlying fracture.  The mastoids and paranasal sinuses are essentially clear.  Orbits: No traumatic findings.  Bilateral cataract resection.  Brain: No evidence of acute abnormality, such as acute infarction, hemorrhage, hydrocephalus, or mass lesion/mass effect. There is generalized brain atrophy. Tortuous vertebrobasilar system, usually  indicating chronic hypertension.  CT CERVICAL SPINE FINDINGS  No acute fracture or malalignment. No gross cervical canal hematoma or prevertebral edema.  Advanced degenerative disc disease throughout the cervical spine. Short pedicles, posterior disc osteophyte complexes, and posterior ligamentous thickening cause spinal canal stenosis from C3-4 to C5-6.  IMPRESSION: 1. No acute intracranial or cervical spine finding. 2. Generalized brain atrophy. 3. Degenerative disc disease with mid cervical canal stenosis.   Electronically Signed   By: Jorje Guild M.D.   On: 11/18/2014 23:54   Mri Brain Without Contrast  11/19/2014   CLINICAL DATA:  80 year old male with slurred speech and abnormal gait. Recent dizziness and fall with forehead injury. Initial encounter.  EXAM: MRI HEAD WITHOUT CONTRAST  MRA HEAD WITHOUT CONTRAST  TECHNIQUE: Multiplanar, multiecho pulse sequences of the brain and surrounding structures were obtained without intravenous contrast. Angiographic images of the head were obtained using MRA technique without contrast.  COMPARISON:  Head and cervical spine CT 11/18/2014.  FINDINGS: MRI HEAD FINDINGS  Confluent 15 mm area of restricted diffusion in the left paracentral pons. Associated T2 and FLAIR hyperintensity. No mass effect or hemorrhage. Major intracranial vascular flow voids are preserved, there  is generalized dolichoectasia of the vertebrobasilar system, resulting in some mass effect on the medulla (series 7, image 6).  No other restricted diffusion. No midline shift, mass effect, evidence of mass lesion, ventriculomegaly, extra-axial collection or acute intracranial hemorrhage. Cervicomedullary junction and pituitary are within normal limits. Multilevel degenerative changes in the cervical spine with degenerative spinal stenosis. Normal for age supratentorial gray and white matter signal.  Normal bone marrow signal. Postoperative changes to the globes. Visualized paranasal sinuses and  mastoids are clear. Visible internal auditory structures appear normal. Visualized scalp soft tissues are within normal limits.  MRA HEAD FINDINGS  Antegrade flow in the posterior circulation. Dominant distal right vertebral artery. Bilateral distal vertebral artery and vertebrobasilar junction tortuosity. Dolichoectatic proximal basilar artery. Mild mass effect on the lower brainstem due to tortuous vessels. No stenosis. Normal SCA and PCA origins. Tortuous proximal PCAs. Normal right posterior communicating artery, the left is diminutive or absent. Bilateral PCA branches are within normal limits.  Antegrade flow in both ICA siphons. Normal ophthalmic and right posterior communicating artery origins. No siphon stenosis. Patent carotid termini. Normal MCA and ACA origins. Tortuous proximal ACA is. Anterior communicating artery within normal limits. Azygos type proximal ACA anatomy. Visualized bilateral ACA branches are tortuous but within normal limits. Tortuous proximal MCAs. Visualized bilateral MCA branches are within normal limits, some tortuous.  IMPRESSION: 1. Small acute brainstem infarct in the left paracentral pons (pontine perforated her artery territory). No mass effect or hemorrhage. 2. Intracranial artery dolichoectasia, otherwise negative intracranial MRA. 3. Otherwise normal for age non contrast brain MRI.   Electronically Signed   By: Lars Pinks M.D.   On: 11/19/2014 09:33   Mr Jodene Nam Head/brain Wo Cm  11/19/2014   CLINICAL DATA:  80 year old male with slurred speech and abnormal gait. Recent dizziness and fall with forehead injury. Initial encounter.  EXAM: MRI HEAD WITHOUT CONTRAST  MRA HEAD WITHOUT CONTRAST  TECHNIQUE: Multiplanar, multiecho pulse sequences of the brain and surrounding structures were obtained without intravenous contrast. Angiographic images of the head were obtained using MRA technique without contrast.  COMPARISON:  Head and cervical spine CT 11/18/2014.  FINDINGS: MRI HEAD  FINDINGS  Confluent 15 mm area of restricted diffusion in the left paracentral pons. Associated T2 and FLAIR hyperintensity. No mass effect or hemorrhage. Major intracranial vascular flow voids are preserved, there is generalized dolichoectasia of the vertebrobasilar system, resulting in some mass effect on the medulla (series 7, image 6).  No other restricted diffusion. No midline shift, mass effect, evidence of mass lesion, ventriculomegaly, extra-axial collection or acute intracranial hemorrhage. Cervicomedullary junction and pituitary are within normal limits. Multilevel degenerative changes in the cervical spine with degenerative spinal stenosis. Normal for age supratentorial gray and white matter signal.  Normal bone marrow signal. Postoperative changes to the globes. Visualized paranasal sinuses and mastoids are clear. Visible internal auditory structures appear normal. Visualized scalp soft tissues are within normal limits.  MRA HEAD FINDINGS  Antegrade flow in the posterior circulation. Dominant distal right vertebral artery. Bilateral distal vertebral artery and vertebrobasilar junction tortuosity. Dolichoectatic proximal basilar artery. Mild mass effect on the lower brainstem due to tortuous vessels. No stenosis. Normal SCA and PCA origins. Tortuous proximal PCAs. Normal right posterior communicating artery, the left is diminutive or absent. Bilateral PCA branches are within normal limits.  Antegrade flow in both ICA siphons. Normal ophthalmic and right posterior communicating artery origins. No siphon stenosis. Patent carotid termini. Normal MCA and ACA origins. Tortuous proximal ACA is. Anterior  communicating artery within normal limits. Azygos type proximal ACA anatomy. Visualized bilateral ACA branches are tortuous but within normal limits. Tortuous proximal MCAs. Visualized bilateral MCA branches are within normal limits, some tortuous.  IMPRESSION: 1. Small acute brainstem infarct in the left  paracentral pons (pontine perforated her artery territory). No mass effect or hemorrhage. 2. Intracranial artery dolichoectasia, otherwise negative intracranial MRA. 3. Otherwise normal for age non contrast brain MRI.   Electronically Signed   By: Lars Pinks M.D.   On: 11/19/2014 09:33     PHYSICAL EXAM Pleasant elderly caucasian male not in distress.Awake alert. Afebrile. Head is nontraumatic. Neck is supple without bruit. Hearing is normal. Cardiac exam no murmur or gallop. Lungs are clear to auscultation. Distal pulses are well felt. Neurological Exam : Awake alert oriented x 3 normal speech and language. Mild right lower face asymmetry. Tongue midline. No drift. Mild diminished fine finger movements on right. Orbits left over right upper extremity. Mild right grip weak.. Normal sensation . Normal coordination.Gait deferred. ASSESSMENT/PLAN Mr. Chad Fuller is a 80 y.o. male with history of diabetes, PE , non-small cell lung cancer status post resection, coronary artery disease presenting with slurred speech and gait instability. He did not receive IV t-PA due to delay in arrival.   StrokeTIA:  left paracentral pontine infarct secondary to small vessel disease source  Resultant  Dysarthria & ataxia  MRI  L paracentral pontine infarct   MRA  Unremarkable   Carotid Doppler  pending   2D Echo pending   Lovenox 40 mg sq daily for VTE prophylaxis  Diet heart healthy/carb modified thin liquids. Passed RN swallow screen. Given pontine location, will have ST reassess   no antithrombotics prior to admission, now on aspirin 325 mg orally every day  Ongoing aggressive risk factor management  Therapy recommendations:  CIR. Ordered. Consult pending   Disposition:  CIR  Hypertension  Stable  Hyperlipidemia  Home meds:  zetia resumed in hospital  LDL 70, goal < 70  Will not add statin at this time given LDL just at goal in this 80 year old guy  Continue zetia at  discharge  Diabetes  HgbA1c pending goal < 7.0  Other Stroke Risk Factors  Advanced age  Former Cigarette smoker, stopped 40 years ago  Coronary artery disease  Other Active Problems  BPH  Other Pertinent History  Lung cancer  PE, not on anticoagulation  Filter placed post knee surgery for blood clots  Hospital day # 1  SHARON BIBY, MSN, APRN, ANVP-BC, AGPCNP-BC [facility] Pager: [phone] 11/19/2014 10:25 AM  I have personally examined this patient, reviewed notes, independently viewed imaging studies, participated in medical decision making and plan of care. I have made any additions or clarifications directly to the above note. Agree with note above.  He has a left pontine lacunar infarct due to small vessel disease. Finish stroke workup Antony Contras, MD Medical Director [place] Pager: [phone] 11/19/2014 12:38 PM    To contact Stroke Continuity provider, please refer to [email or web address] After hours, contact General Neurology

## 2014-11-19 NOTE — Progress Notes (Signed)
PROGRESS NOTE  CRESCENCIO JOZWIAK WNI:[id number] DOB: 08/03/1926 DOA: 11/18/2014 PCP: Mathews Argyle, MD  HPI/Subjective: 80 yo male with PMH of diabetes mellitus, non-small cell lung cancer status post resection, coronary artery disease, prostate enlargement, left foot ingrowing toe.  Pt presented with an episode of dizziness and slurred speech. He mentions that at around 2 PM he noted that he was having difficulty standing with dizziness he felt unsteady and had a fall hitting right side of his forehead as well as right elbow.  He called his son as he was not feeling good felt that the patient was having some slurred speech.  At around 4 PM and son came to house he found that he continues to have slurred speech and brought him here for further workup.  Pt denies any other complaint including fever, chills, chest pain, shortness of breath, cough, nausea, vomiting, abdominal pain, diarrhea, burning urination, any other focal deficit.  He mentions that since last one month he has been having on and off episodes of dizziness and therefore he has stopped taking many of his medications 2 weeks ago. He was also having diarrhea with Protonix and he stopped taking that as well.  He informed his PCP last week about his stopping his medication and was recommended to continue with the same due to his symptoms.  The patient lives alone and at his baseline is independent for most of his ADL.  Pt and daughter believe he has improved since yesterday but speech is still slurred.  Having some difficulty with swallowing regular diet.    Assessment/Plan: Acute CVA: Improving.  Some slurred speech and facial droop noted.  Difficulty swallowing.  MRI of the brain shows small infarct in the left paracentral pons.  CT of head and c spine showed no acute findings.  2D Echo pending.  Carotid doppler does not show any significant stenosis. Lipid panel shows decreased HDL at 36 and LDL at 70.  A1C pending.  Will continue  Metformin, Lantus, SSI, and Zetia.  Will resume Tradjenta and Glipizide once discharged.  Neurology recommends not starting statin therapy at this time since LDL is at goal.  Will start ASA 325mg .  PT recommends CIR; Suppervision/Assistance- 24hours.  Will get OT and Speech consult.             Hypertension: Chronic problem. Last BP 127/68. Monitor off antihypertensives for now-will resume HCTZ at discharge.    Diabetes mellitus, type 2: Chronic problem.  A1C pending.  A1C goal of <7.0.  Continue Metformin, Lantus, and SSI.  Will resume Tradjenta at discharge.    Hx of on-small cell carcinoma of lung: Diagnosed 09/2004.  RUL wedge reseection with lymph node dissection in 12/22/04.  No evidence of recurrence.  Followed by [facility] in oncology.   DVT Prophylaxis:  Lovemox 40mg  daily  Code Status: Full Family Communication: Pt is awake and alert.  Daughter at bedside Disposition Plan: Inpatient rehab or SNF.   Consultants:  Neurology  Procedures:  2D Echo  Carotid doppler  Antibiotics:  None  Objective: Filed Vitals:   11/19/14 0600 11/19/14 0800 11/19/14 1022 11/19/14 1220  BP: 153/87 137/69 128/76 127/68  Pulse: 84 82 89 82  Temp: 97.5 F (36.4 C)  97.8 F (36.6 C) 98.1 F (36.7 C)  TempSrc: Oral Oral Oral Oral  Resp: 20 20 20 20   Height:      Weight:      SpO2: 96%       Intake/Output Summary (Last 24  hours) at 11/19/14 1325 Last data filed at 11/19/14 0440  Gross per 24 hour  Intake      0 ml  Output    275 ml  Net   -275 ml   Filed Weights   11/18/14 2003  Weight: 99.791 kg (220 lb)    Exam: General: NAD, appears stated age  40:  Anicteic Sclera, MMM.   Neck: Supple, no JVD,  Cardiovascular: RRR, S1 S2 auscultated, no rubs, murmurs or gallops.   Respiratory: Clear to auscultation bilaterally with equal chest rise  Abdomen: Soft, nontender, nondistended, + bowel sounds  Extremities: warm dry without cyanosis clubbing or edema.  Neuro: AAOx3, cranial  nerves grossly intact. Strength 5/5 in upper and lower extremities.  Left sided facial droop.  Mild dysarthria.   Skin: Without rashes exudates or nodules.   Psych: Normal affect and demeanor with intact judgement and insight   Data Reviewed: Basic Metabolic Panel:  Recent Labs Lab 11/18/14 2045 11/19/14 0911  NA 138 141  K 4.0 4.0  CL 98 101  CO2 26 29  GLUCOSE 248* 166*  BUN 15 14  CREATININE 0.69 0.72  CALCIUM 9.6 8.9   Liver Function Tests:  Recent Labs Lab 11/18/14 2045 11/19/14 0911  AST 22 18  ALT 16 15  ALKPHOS 104 98  BILITOT 0.5 0.7  PROT 6.7 6.6  ALBUMIN 3.7 3.6   CBC:  Recent Labs Lab 11/18/14 2045 11/19/14 0911  WBC 7.0 6.4  NEUTROABS 5.1 3.9  HGB 14.2 13.9  HCT 41.1 40.7  MCV 90.9 93.3  PLT 217 225   Cardiac Enzymes:  Recent Labs Lab 11/18/14 2045  TROPONINI <0.30   CBG:  Recent Labs Lab 11/19/14 0105 11/19/14 0702 11/19/14 1148  GLUCAP 179* 212* 274*    Studies: Dg Chest 2 View  11/18/2014   CLINICAL DATA:  Dizzy, fall.  Slurred speech  EXAM: CHEST  2 VIEW  COMPARISON:  Radiograph 02/21/2014  FINDINGS: Normal cardiac silhouette. Right-sided pulmonary scarring again demonstrated and not changed from CT 08/05/2012. Lungs are hyperinflated. Degenerate spurring of the spine.  IMPRESSION: 1. No acute cardiopulmonary findings. 2. Chronic scarring and nodularity in the right upper lobe.   Electronically Signed   By: Suzy Bouchard M.D.   On: 11/18/2014 21:54   Ct Head Wo Contrast  11/18/2014   CLINICAL DATA:  Dizziness with fall. Mild headache and right forehead bruising. Initial encounter  EXAM: CT HEAD WITHOUT CONTRAST  CT CERVICAL SPINE WITHOUT CONTRAST  TECHNIQUE: Multidetector CT imaging of the head and cervical spine was performed following the standard protocol without intravenous contrast. Multiplanar CT image reconstructions of the cervical spine were also generated.  COMPARISON:  None.  FINDINGS: CT HEAD FINDINGS  Skull and  Sinuses:Right forehead contusion.  No underlying fracture.  The mastoids and paranasal sinuses are essentially clear.  Orbits: No traumatic findings.  Bilateral cataract resection.  Brain: No evidence of acute abnormality, such as acute infarction, hemorrhage, hydrocephalus, or mass lesion/mass effect. There is generalized brain atrophy. Tortuous vertebrobasilar system, usually indicating chronic hypertension.  CT CERVICAL SPINE FINDINGS  No acute fracture or malalignment. No gross cervical canal hematoma or prevertebral edema.  Advanced degenerative disc disease throughout the cervical spine. Short pedicles, posterior disc osteophyte complexes, and posterior ligamentous thickening cause spinal canal stenosis from C3-4 to C5-6.  IMPRESSION: 1. No acute intracranial or cervical spine finding. 2. Generalized brain atrophy. 3. Degenerative disc disease with mid cervical canal stenosis.   Electronically Signed  By: Jorje Guild M.D.   On: 11/18/2014 23:54   Ct Cervical Spine Wo Contrast  11/18/2014   CLINICAL DATA:  Dizziness with fall. Mild headache and right forehead bruising. Initial encounter  EXAM: CT HEAD WITHOUT CONTRAST  CT CERVICAL SPINE WITHOUT CONTRAST  TECHNIQUE: Multidetector CT imaging of the head and cervical spine was performed following the standard protocol without intravenous contrast. Multiplanar CT image reconstructions of the cervical spine were also generated.  COMPARISON:  None.  FINDINGS: CT HEAD FINDINGS  Skull and Sinuses:Right forehead contusion.  No underlying fracture.  The mastoids and paranasal sinuses are essentially clear.  Orbits: No traumatic findings.  Bilateral cataract resection.  Brain: No evidence of acute abnormality, such as acute infarction, hemorrhage, hydrocephalus, or mass lesion/mass effect. There is generalized brain atrophy. Tortuous vertebrobasilar system, usually indicating chronic hypertension.  CT CERVICAL SPINE FINDINGS  No acute fracture or malalignment. No  gross cervical canal hematoma or prevertebral edema.  Advanced degenerative disc disease throughout the cervical spine. Short pedicles, posterior disc osteophyte complexes, and posterior ligamentous thickening cause spinal canal stenosis from C3-4 to C5-6.  IMPRESSION: 1. No acute intracranial or cervical spine finding. 2. Generalized brain atrophy. 3. Degenerative disc disease with mid cervical canal stenosis.   Electronically Signed   By: Jorje Guild M.D.   On: 11/18/2014 23:54   Mri Brain Without Contrast  11/19/2014   CLINICAL DATA:  80 year old male with slurred speech and abnormal gait. Recent dizziness and fall with forehead injury. Initial encounter.  EXAM: MRI HEAD WITHOUT CONTRAST  MRA HEAD WITHOUT CONTRAST  TECHNIQUE: Multiplanar, multiecho pulse sequences of the brain and surrounding structures were obtained without intravenous contrast. Angiographic images of the head were obtained using MRA technique without contrast.  COMPARISON:  Head and cervical spine CT 11/18/2014.  FINDINGS: MRI HEAD FINDINGS  Confluent 15 mm area of restricted diffusion in the left paracentral pons. Associated T2 and FLAIR hyperintensity. No mass effect or hemorrhage. Major intracranial vascular flow voids are preserved, there is generalized dolichoectasia of the vertebrobasilar system, resulting in some mass effect on the medulla (series 7, image 6).  No other restricted diffusion. No midline shift, mass effect, evidence of mass lesion, ventriculomegaly, extra-axial collection or acute intracranial hemorrhage. Cervicomedullary junction and pituitary are within normal limits. Multilevel degenerative changes in the cervical spine with degenerative spinal stenosis. Normal for age supratentorial gray and white matter signal.  Normal bone marrow signal. Postoperative changes to the globes. Visualized paranasal sinuses and mastoids are clear. Visible internal auditory structures appear normal. Visualized scalp soft tissues  are within normal limits.  MRA HEAD FINDINGS  Antegrade flow in the posterior circulation. Dominant distal right vertebral artery. Bilateral distal vertebral artery and vertebrobasilar junction tortuosity. Dolichoectatic proximal basilar artery. Mild mass effect on the lower brainstem due to tortuous vessels. No stenosis. Normal SCA and PCA origins. Tortuous proximal PCAs. Normal right posterior communicating artery, the left is diminutive or absent. Bilateral PCA branches are within normal limits.  Antegrade flow in both ICA siphons. Normal ophthalmic and right posterior communicating artery origins. No siphon stenosis. Patent carotid termini. Normal MCA and ACA origins. Tortuous proximal ACA is. Anterior communicating artery within normal limits. Azygos type proximal ACA anatomy. Visualized bilateral ACA branches are tortuous but within normal limits. Tortuous proximal MCAs. Visualized bilateral MCA branches are within normal limits, some tortuous.  IMPRESSION: 1. Small acute brainstem infarct in the left paracentral pons (pontine perforated her artery territory). No mass effect or hemorrhage. 2. Intracranial  artery dolichoectasia, otherwise negative intracranial MRA. 3. Otherwise normal for age non contrast brain MRI.   Electronically Signed   By: Lars Pinks M.D.   On: 11/19/2014 09:33   Mr Jodene Nam Head/brain Wo Cm  11/19/2014   CLINICAL DATA:  80 year old male with slurred speech and abnormal gait. Recent dizziness and fall with forehead injury. Initial encounter.  EXAM: MRI HEAD WITHOUT CONTRAST  MRA HEAD WITHOUT CONTRAST  TECHNIQUE: Multiplanar, multiecho pulse sequences of the brain and surrounding structures were obtained without intravenous contrast. Angiographic images of the head were obtained using MRA technique without contrast.  COMPARISON:  Head and cervical spine CT 11/18/2014.  FINDINGS: MRI HEAD FINDINGS  Confluent 15 mm area of restricted diffusion in the left paracentral pons. Associated T2 and  FLAIR hyperintensity. No mass effect or hemorrhage. Major intracranial vascular flow voids are preserved, there is generalized dolichoectasia of the vertebrobasilar system, resulting in some mass effect on the medulla (series 7, image 6).  No other restricted diffusion. No midline shift, mass effect, evidence of mass lesion, ventriculomegaly, extra-axial collection or acute intracranial hemorrhage. Cervicomedullary junction and pituitary are within normal limits. Multilevel degenerative changes in the cervical spine with degenerative spinal stenosis. Normal for age supratentorial gray and white matter signal.  Normal bone marrow signal. Postoperative changes to the globes. Visualized paranasal sinuses and mastoids are clear. Visible internal auditory structures appear normal. Visualized scalp soft tissues are within normal limits.  MRA HEAD FINDINGS  Antegrade flow in the posterior circulation. Dominant distal right vertebral artery. Bilateral distal vertebral artery and vertebrobasilar junction tortuosity. Dolichoectatic proximal basilar artery. Mild mass effect on the lower brainstem due to tortuous vessels. No stenosis. Normal SCA and PCA origins. Tortuous proximal PCAs. Normal right posterior communicating artery, the left is diminutive or absent. Bilateral PCA branches are within normal limits.  Antegrade flow in both ICA siphons. Normal ophthalmic and right posterior communicating artery origins. No siphon stenosis. Patent carotid termini. Normal MCA and ACA origins. Tortuous proximal ACA is. Anterior communicating artery within normal limits. Azygos type proximal ACA anatomy. Visualized bilateral ACA branches are tortuous but within normal limits. Tortuous proximal MCAs. Visualized bilateral MCA branches are within normal limits, some tortuous.  IMPRESSION: 1. Small acute brainstem infarct in the left paracentral pons (pontine perforated her artery territory). No mass effect or hemorrhage. 2. Intracranial  artery dolichoectasia, otherwise negative intracranial MRA. 3. Otherwise normal for age non contrast brain MRI.   Electronically Signed   By: Lars Pinks M.D.   On: 11/19/2014 09:33    Scheduled Meds: . aspirin  325 mg Oral Daily  . enoxaparin (LOVENOX) injection  40 mg Subcutaneous Q24H  . ezetimibe  10 mg Oral Daily  . finasteride  5 mg Oral Daily  . insulin aspart  0-15 Units Subcutaneous TID WC  . insulin glargine  70 Units Subcutaneous Daily   Continuous Infusions:   Principal Problem:   TIA (transient ischemic attack) Active Problems:   Coronary atherosclerosis   Type 2 diabetes mellitus   Aortic heart murmur   Non-small cell carcinoma of lung   Recurrent falls   Dizziness and giddiness    Adelene Idler PA-S  [facility] Pager [phone]. If 7PM-7AM, please contact night-coverage at [email or web address], password [facility] 11/19/2014, 1:25 PM  LOS: 1 day    Attending Patient was seen, examined,treatment plan was discussed with the  Advance Practice Provider.  I have directly reviewed the clinical findings, lab, imaging studies and management of this patient in detail.  I have made the necessary changes to the above noted documentation, and agree with the documentation, as recorded by the Advance Practice Provider.    Nena Alexander MD [facility].

## 2014-11-19 NOTE — Consult Note (Signed)
Physical Medicine and Rehabilitation Consult Reason for Consult: CVA Referring Physician: [facility]   HPI: Chad Fuller is a 80 y.o. right handed male with history of diabetes mellitus and peripheral neuropathy, non-small cell lung cancer status post resection, coronary artery disease with stenting. Admitted 11/19/2014 with slurred speech and gait instability. Patient independent prior to admission living alone widowed 8 years. MRI of the brain showed a small acute brainstem infarct in the left paracentral pons. No mass effect or hemorrhage. MRA of the head unremarkable. Carotid Dopplers with no ICA stenosis. Echocardiogram is pending. Patient did not receive TPA. Neurology services consulted placed on aspirin for CVA prophylaxis as well as subcutaneous Lovenox for DVT prophylaxis. Dysphagia #2 diet with thin liquids. Physical therapy evaluation completed 11/19/2014 with recommendations of physical medicine rehabilitation consult.   Review of Systems  Genitourinary: Positive for frequency.  Musculoskeletal: Positive for myalgias.  Neurological: Positive for speech change and weakness.  All other systems reviewed and are negative.  Past Medical History  Diagnosis Date  . Diabetes mellitus   . Cancer     lung  . Prostate enlargement   . History of blood clots   . CAD (coronary artery disease)   . Pulmonary embolus july 2005   Past Surgical History  Procedure Laterality Date  . Lung removal, partial    . Coronary stent placement    . Knee surgery    . Filtering procedure      reports filter placed after knee surgery for blood clots  . I&d extremity  08/16/2012    Procedure: MINOR IRRIGATION AND DEBRIDEMENT EXTREMITY;  Surgeon: Tennis Must, MD;  Location: [place];  Service: Orthopedics;  Laterality: Right;   History reviewed. No pertinent family history. Social History:  reports that he quit smoking about 40 years ago. His smoking use included Cigarettes.  He smoked 0.00 packs per day. He does not have any smokeless tobacco history on file. He reports that he does not drink alcohol or use illicit drugs. Allergies: No Known Allergies Medications Prior to Admission  Medication Dose Route Frequency Provider Last Rate Last Dose  . [DISCONTINUED] triamcinolone acetonide (KENALOG) 10 MG/ML injection 10 mg  10 mg Intra-articular Once Bronson Ing, DPM       Medications Prior to Admission  Medication Sig Dispense Refill  . GLUCOSAMINE CHONDROITIN COMPLX PO Take 2 tablets by mouth daily.      . insulin glargine (LANTUS) 100 UNIT/ML injection Inject 70 Units into the skin daily.    . metFORMIN (GLUCOPHAGE-XR) 500 MG 24 hr tablet Take 500 mg by mouth daily with breakfast.    . Multiple Vitamins-Minerals (MULTIVITAMINS THER. W/MINERALS) TABS Take 1 tablet by mouth daily.      . silver sulfADIAZINE (SILVADENE) 1 % cream Apply 1 application topically daily. 50 g 0  . ACCU-CHEK AVIVA PLUS test strip     . ezetimibe (ZETIA) 10 MG tablet Take 10 mg by mouth daily.    . finasteride (PROSCAR) 5 MG tablet Take 5 mg by mouth daily.      . tamsulosin (FLOMAX) 0.4 MG CAPS capsule       Home: Home Living Family/patient expects to be discharged to:: Private residence Living Arrangements: Alone Type of Home: House Home Access: Stairs to enter Technical brewer of Steps: 4 Entrance Stairs-Rails: Right Home Layout: One level Home Equipment: [place] - single point  Functional History: Prior Function Level of Independence: Independent Comments: Pt reports doing  some cooking, driving and independent for ADLs. Reports, "throwing himself up the steps." Functional Status:  Mobility: Bed Mobility Overal bed mobility: Needs Assistance Bed Mobility: Supine to Sit Supine to sit: Supervision, HOB elevated General bed mobility comments: Heavy use of rails. Increased time and effort. Transfers Overall transfer level: Needs assistance Equipment used:  None Transfers: Sit to/from Stand Sit to Stand: Min assist General transfer comment: Min A to rise from EOB and toilet. Impulsive as pt trying to get up on own from toilet when told to ask for assist when done. Transferred to chair. VC's to reach back for surface. Ambulation/Gait Ambulation/Gait assistance: Mod assist Ambulation Distance (Feet): 16 Feet (x2 bouts) Assistive device: 1 person hand held assist Gait Pattern/deviations: Step-through pattern, Decreased stride length, Decreased dorsiflexion - right Gait velocity: decreased Gait velocity interpretation: Below normal speed for age/gender General Gait Details: Knee buckling of RLE taking first step as pt impulsive. Decreased DF right foot resulting in dragging RLE during swing phase. VC's to pick up RLE to advance to prevent tripping. Increased forward momentum due to impulsivity resulting in LOB x2 forward with mod A to prevent fall.    ADL:    Cognition: Cognition Overall Cognitive Status: Within Functional Limits for tasks assessed Orientation Level: Oriented X4 Cognition Arousal/Alertness: Awake/alert Behavior During Therapy: Impulsive Overall Cognitive Status: Within Functional Limits for tasks assessed  Blood pressure 127/68, pulse 82, temperature 98.1 F (36.7 C), temperature source Oral, resp. rate 20, height 6\' 2"  (1.88 m), weight 99.791 kg (220 lb), SpO2 96 %. Physical Exam  Vitals reviewed. HENT:  Head: Normocephalic.  Eyes: EOM are normal.  Neck: Normal range of motion. Neck supple. No thyromegaly present.  Cardiovascular: Normal rate and regular rhythm.   Respiratory: Effort normal and breath sounds normal. No respiratory distress.  GI: Soft. Bowel sounds are normal. He exhibits no distension.  Neurological: He is alert.  Speech is very dysarthric but intelligible. He is oriented to person place and age. He did follow simple commands. Right central 7 and tongue deviation. No other obvious cn abnl. RUE: 2 to  2+/5 deltoid, bicep, tricep, HI. RLE: 2+ hf, ke, adf/apf but apraxic. Had delays in initiating movement. Better spontaneously.   Skin: Skin is warm and dry.  Psychiatric: He has a normal mood and affect.    Results for orders placed or performed during the hospital encounter of 11/18/14 (from the past 24 hour(s))  CBC with Differential     Status: None   Collection Time: 11/18/14  8:45 PM  Result Value Ref Range   WBC 7.0 4.0 - 10.5 K/uL   RBC 4.52 4.22 - 5.81 MIL/uL   Hemoglobin 14.2 13.0 - 17.0 g/dL   HCT 41.1 39.0 - 52.0 %   MCV 90.9 78.0 - 100.0 fL   MCH 31.4 26.0 - 34.0 pg   MCHC 34.5 30.0 - 36.0 g/dL   RDW 12.6 11.5 - 15.5 %   Platelets 217 150 - 400 K/uL   Neutrophils Relative % 73 43 - 77 %   Neutro Abs 5.1 1.7 - 7.7 K/uL   Lymphocytes Relative 19 12 - 46 %   Lymphs Abs 1.3 0.7 - 4.0 K/uL   Monocytes Relative 6 3 - 12 %   Monocytes Absolute 0.4 0.1 - 1.0 K/uL   Eosinophils Relative 2 0 - 5 %   Eosinophils Absolute 0.1 0.0 - 0.7 K/uL   Basophils Relative 0 0 - 1 %   Basophils Absolute 0.0 0.0 -  0.1 K/uL  Comprehensive metabolic panel     Status: Abnormal   Collection Time: 11/18/14  8:45 PM  Result Value Ref Range   Sodium 138 137 - 147 mEq/L   Potassium 4.0 3.7 - 5.3 mEq/L   Chloride 98 96 - 112 mEq/L   CO2 26 19 - 32 mEq/L   Glucose, Bld 248 (H) 70 - 99 mg/dL   BUN 15 6 - 23 mg/dL   Creatinine, Ser 0.69 0.50 - 1.35 mg/dL   Calcium 9.6 8.4 - 10.5 mg/dL   Total Protein 6.7 6.0 - 8.3 g/dL   Albumin 3.7 3.5 - 5.2 g/dL   AST 22 0 - 37 U/L   ALT 16 0 - 53 U/L   Alkaline Phosphatase 104 39 - 117 U/L   Total Bilirubin 0.5 0.3 - 1.2 mg/dL   GFR calc non Af Amer 84 (L) >90 mL/min   GFR calc Af Amer >90 >90 mL/min   Anion gap 14 5 - 15  Troponin I     Status: None   Collection Time: 11/18/14  8:45 PM  Result Value Ref Range   Troponin I <0.30 <0.30 ng/mL  CBG monitoring, ED     Status: Abnormal   Collection Time: 11/19/14  1:05 AM  Result Value Ref Range    Glucose-Capillary 179 (H) 70 - 99 mg/dL  Urinalysis, Routine w reflex microscopic     Status: Abnormal   Collection Time: 11/19/14  4:40 AM  Result Value Ref Range   Color, Urine YELLOW YELLOW   APPearance CLEAR CLEAR   Specific Gravity, Urine 1.026 1.005 - 1.030   pH 6.5 5.0 - 8.0   Glucose, UA >1000 (A) NEGATIVE mg/dL   Hgb urine dipstick SMALL (A) NEGATIVE   Bilirubin Urine NEGATIVE NEGATIVE   Ketones, ur 15 (A) NEGATIVE mg/dL   Protein, ur NEGATIVE NEGATIVE mg/dL   Urobilinogen, UA 1.0 0.0 - 1.0 mg/dL   Nitrite NEGATIVE NEGATIVE   Leukocytes, UA NEGATIVE NEGATIVE  Urine rapid drug screen (hosp performed)     Status: None   Collection Time: 11/19/14  4:40 AM  Result Value Ref Range   Opiates NONE DETECTED NONE DETECTED   Cocaine NONE DETECTED NONE DETECTED   Benzodiazepines NONE DETECTED NONE DETECTED   Amphetamines NONE DETECTED NONE DETECTED   Tetrahydrocannabinol NONE DETECTED NONE DETECTED   Barbiturates NONE DETECTED NONE DETECTED  Urine microscopic-add on     Status: Abnormal   Collection Time: 11/19/14  4:40 AM  Result Value Ref Range   Squamous Epithelial / LPF FEW (A) RARE   WBC, UA 0-2 <3 WBC/hpf   RBC / HPF 3-6 <3 RBC/hpf   Bacteria, UA FEW (A) RARE   Urine-Other MUCOUS PRESENT   Glucose, capillary     Status: Abnormal   Collection Time: 11/19/14  7:02 AM  Result Value Ref Range   Glucose-Capillary 212 (H) 70 - 99 mg/dL   Comment 1 Documented in Chart    Comment 2 Notify RN   Fasting lipid panel     Status: Abnormal   Collection Time: 11/19/14  7:10 AM  Result Value Ref Range   Cholesterol 120 0 - 200 mg/dL   Triglycerides 72 <150 mg/dL   HDL 36 (L) >39 mg/dL   Total CHOL/HDL Ratio 3.3 RATIO   VLDL 14 0 - 40 mg/dL   LDL Cholesterol 70 0 - 99 mg/dL  CBC with Differential     Status: None   Collection Time: 11/19/14  9:11 AM  Result Value Ref Range   WBC 6.4 4.0 - 10.5 K/uL   RBC 4.36 4.22 - 5.81 MIL/uL   Hemoglobin 13.9 13.0 - 17.0 g/dL   HCT 40.7  39.0 - 52.0 %   MCV 93.3 78.0 - 100.0 fL   MCH 31.9 26.0 - 34.0 pg   MCHC 34.2 30.0 - 36.0 g/dL   RDW 12.5 11.5 - 15.5 %   Platelets 225 150 - 400 K/uL   Neutrophils Relative % 61 43 - 77 %   Neutro Abs 3.9 1.7 - 7.7 K/uL   Lymphocytes Relative 28 12 - 46 %   Lymphs Abs 1.8 0.7 - 4.0 K/uL   Monocytes Relative 9 3 - 12 %   Monocytes Absolute 0.6 0.1 - 1.0 K/uL   Eosinophils Relative 2 0 - 5 %   Eosinophils Absolute 0.1 0.0 - 0.7 K/uL   Basophils Relative 0 0 - 1 %   Basophils Absolute 0.0 0.0 - 0.1 K/uL  Comprehensive metabolic panel     Status: Abnormal   Collection Time: 11/19/14  9:11 AM  Result Value Ref Range   Sodium 141 137 - 147 mEq/L   Potassium 4.0 3.7 - 5.3 mEq/L   Chloride 101 96 - 112 mEq/L   CO2 29 19 - 32 mEq/L   Glucose, Bld 166 (H) 70 - 99 mg/dL   BUN 14 6 - 23 mg/dL   Creatinine, Ser 0.72 0.50 - 1.35 mg/dL   Calcium 8.9 8.4 - 10.5 mg/dL   Total Protein 6.6 6.0 - 8.3 g/dL   Albumin 3.6 3.5 - 5.2 g/dL   AST 18 0 - 37 U/L   ALT 15 0 - 53 U/L   Alkaline Phosphatase 98 39 - 117 U/L   Total Bilirubin 0.7 0.3 - 1.2 mg/dL   GFR calc non Af Amer 82 (L) >90 mL/min   GFR calc Af Amer >90 >90 mL/min   Anion gap 11 5 - 15  Protime-INR     Status: None   Collection Time: 11/19/14  9:11 AM  Result Value Ref Range   Prothrombin Time 14.6 11.6 - 15.2 seconds   INR 1.13 0.00 - 1.49  Glucose, capillary     Status: Abnormal   Collection Time: 11/19/14 11:48 AM  Result Value Ref Range   Glucose-Capillary 274 (H) 70 - 99 mg/dL   Dg Chest 2 View  11/18/2014   CLINICAL DATA:  Dizzy, fall.  Slurred speech  EXAM: CHEST  2 VIEW  COMPARISON:  Radiograph 02/21/2014  FINDINGS: Normal cardiac silhouette. Right-sided pulmonary scarring again demonstrated and not changed from CT 08/05/2012. Lungs are hyperinflated. Degenerate spurring of the spine.  IMPRESSION: 1. No acute cardiopulmonary findings. 2. Chronic scarring and nodularity in the right upper lobe.   Electronically Signed    By: Suzy Bouchard M.D.   On: 11/18/2014 21:54   Ct Head Wo Contrast  11/18/2014   CLINICAL DATA:  Dizziness with fall. Mild headache and right forehead bruising. Initial encounter  EXAM: CT HEAD WITHOUT CONTRAST  CT CERVICAL SPINE WITHOUT CONTRAST  TECHNIQUE: Multidetector CT imaging of the head and cervical spine was performed following the standard protocol without intravenous contrast. Multiplanar CT image reconstructions of the cervical spine were also generated.  COMPARISON:  None.  FINDINGS: CT HEAD FINDINGS  Skull and Sinuses:Right forehead contusion.  No underlying fracture.  The mastoids and paranasal sinuses are essentially clear.  Orbits: No traumatic findings.  Bilateral cataract resection.  Brain:  No evidence of acute abnormality, such as acute infarction, hemorrhage, hydrocephalus, or mass lesion/mass effect. There is generalized brain atrophy. Tortuous vertebrobasilar system, usually indicating chronic hypertension.  CT CERVICAL SPINE FINDINGS  No acute fracture or malalignment. No gross cervical canal hematoma or prevertebral edema.  Advanced degenerative disc disease throughout the cervical spine. Short pedicles, posterior disc osteophyte complexes, and posterior ligamentous thickening cause spinal canal stenosis from C3-4 to C5-6.  IMPRESSION: 1. No acute intracranial or cervical spine finding. 2. Generalized brain atrophy. 3. Degenerative disc disease with mid cervical canal stenosis.   Electronically Signed   By: Jorje Guild M.D.   On: 11/18/2014 23:54   Ct Cervical Spine Wo Contrast  11/18/2014   CLINICAL DATA:  Dizziness with fall. Mild headache and right forehead bruising. Initial encounter  EXAM: CT HEAD WITHOUT CONTRAST  CT CERVICAL SPINE WITHOUT CONTRAST  TECHNIQUE: Multidetector CT imaging of the head and cervical spine was performed following the standard protocol without intravenous contrast. Multiplanar CT image reconstructions of the cervical spine were also generated.   COMPARISON:  None.  FINDINGS: CT HEAD FINDINGS  Skull and Sinuses:Right forehead contusion.  No underlying fracture.  The mastoids and paranasal sinuses are essentially clear.  Orbits: No traumatic findings.  Bilateral cataract resection.  Brain: No evidence of acute abnormality, such as acute infarction, hemorrhage, hydrocephalus, or mass lesion/mass effect. There is generalized brain atrophy. Tortuous vertebrobasilar system, usually indicating chronic hypertension.  CT CERVICAL SPINE FINDINGS  No acute fracture or malalignment. No gross cervical canal hematoma or prevertebral edema.  Advanced degenerative disc disease throughout the cervical spine. Short pedicles, posterior disc osteophyte complexes, and posterior ligamentous thickening cause spinal canal stenosis from C3-4 to C5-6.  IMPRESSION: 1. No acute intracranial or cervical spine finding. 2. Generalized brain atrophy. 3. Degenerative disc disease with mid cervical canal stenosis.   Electronically Signed   By: Jorje Guild M.D.   On: 11/18/2014 23:54   Mri Brain Without Contrast  11/19/2014   CLINICAL DATA:  80 year old male with slurred speech and abnormal gait. Recent dizziness and fall with forehead injury. Initial encounter.  EXAM: MRI HEAD WITHOUT CONTRAST  MRA HEAD WITHOUT CONTRAST  TECHNIQUE: Multiplanar, multiecho pulse sequences of the brain and surrounding structures were obtained without intravenous contrast. Angiographic images of the head were obtained using MRA technique without contrast.  COMPARISON:  Head and cervical spine CT 11/18/2014.  FINDINGS: MRI HEAD FINDINGS  Confluent 15 mm area of restricted diffusion in the left paracentral pons. Associated T2 and FLAIR hyperintensity. No mass effect or hemorrhage. Major intracranial vascular flow voids are preserved, there is generalized dolichoectasia of the vertebrobasilar system, resulting in some mass effect on the medulla (series 7, image 6).  No other restricted diffusion. No  midline shift, mass effect, evidence of mass lesion, ventriculomegaly, extra-axial collection or acute intracranial hemorrhage. Cervicomedullary junction and pituitary are within normal limits. Multilevel degenerative changes in the cervical spine with degenerative spinal stenosis. Normal for age supratentorial gray and white matter signal.  Normal bone marrow signal. Postoperative changes to the globes. Visualized paranasal sinuses and mastoids are clear. Visible internal auditory structures appear normal. Visualized scalp soft tissues are within normal limits.  MRA HEAD FINDINGS  Antegrade flow in the posterior circulation. Dominant distal right vertebral artery. Bilateral distal vertebral artery and vertebrobasilar junction tortuosity. Dolichoectatic proximal basilar artery. Mild mass effect on the lower brainstem due to tortuous vessels. No stenosis. Normal SCA and PCA origins. Tortuous proximal PCAs. Normal right posterior communicating  artery, the left is diminutive or absent. Bilateral PCA branches are within normal limits.  Antegrade flow in both ICA siphons. Normal ophthalmic and right posterior communicating artery origins. No siphon stenosis. Patent carotid termini. Normal MCA and ACA origins. Tortuous proximal ACA is. Anterior communicating artery within normal limits. Azygos type proximal ACA anatomy. Visualized bilateral ACA branches are tortuous but within normal limits. Tortuous proximal MCAs. Visualized bilateral MCA branches are within normal limits, some tortuous.  IMPRESSION: 1. Small acute brainstem infarct in the left paracentral pons (pontine perforated her artery territory). No mass effect or hemorrhage. 2. Intracranial artery dolichoectasia, otherwise negative intracranial MRA. 3. Otherwise normal for age non contrast brain MRI.   Electronically Signed   By: Lars Pinks M.D.   On: 11/19/2014 09:33   Mr Jodene Nam Head/brain Wo Cm  11/19/2014   CLINICAL DATA:  80 year old male with slurred speech  and abnormal gait. Recent dizziness and fall with forehead injury. Initial encounter.  EXAM: MRI HEAD WITHOUT CONTRAST  MRA HEAD WITHOUT CONTRAST  TECHNIQUE: Multiplanar, multiecho pulse sequences of the brain and surrounding structures were obtained without intravenous contrast. Angiographic images of the head were obtained using MRA technique without contrast.  COMPARISON:  Head and cervical spine CT 11/18/2014.  FINDINGS: MRI HEAD FINDINGS  Confluent 15 mm area of restricted diffusion in the left paracentral pons. Associated T2 and FLAIR hyperintensity. No mass effect or hemorrhage. Major intracranial vascular flow voids are preserved, there is generalized dolichoectasia of the vertebrobasilar system, resulting in some mass effect on the medulla (series 7, image 6).  No other restricted diffusion. No midline shift, mass effect, evidence of mass lesion, ventriculomegaly, extra-axial collection or acute intracranial hemorrhage. Cervicomedullary junction and pituitary are within normal limits. Multilevel degenerative changes in the cervical spine with degenerative spinal stenosis. Normal for age supratentorial gray and white matter signal.  Normal bone marrow signal. Postoperative changes to the globes. Visualized paranasal sinuses and mastoids are clear. Visible internal auditory structures appear normal. Visualized scalp soft tissues are within normal limits.  MRA HEAD FINDINGS  Antegrade flow in the posterior circulation. Dominant distal right vertebral artery. Bilateral distal vertebral artery and vertebrobasilar junction tortuosity. Dolichoectatic proximal basilar artery. Mild mass effect on the lower brainstem due to tortuous vessels. No stenosis. Normal SCA and PCA origins. Tortuous proximal PCAs. Normal right posterior communicating artery, the left is diminutive or absent. Bilateral PCA branches are within normal limits.  Antegrade flow in both ICA siphons. Normal ophthalmic and right posterior  communicating artery origins. No siphon stenosis. Patent carotid termini. Normal MCA and ACA origins. Tortuous proximal ACA is. Anterior communicating artery within normal limits. Azygos type proximal ACA anatomy. Visualized bilateral ACA branches are tortuous but within normal limits. Tortuous proximal MCAs. Visualized bilateral MCA branches are within normal limits, some tortuous.  IMPRESSION: 1. Small acute brainstem infarct in the left paracentral pons (pontine perforated her artery territory). No mass effect or hemorrhage. 2. Intracranial artery dolichoectasia, otherwise negative intracranial MRA. 3. Otherwise normal for age non contrast brain MRI.   Electronically Signed   By: Lars Pinks M.D.   On: 11/19/2014 09:33    Assessment/Plan: Diagnosis: left paracentral pontine infarct 1. Does the need for close, 24 hr/day medical supervision in concert with the patient's rehab needs make it unreasonable for this patient to be served in a less intensive setting? Yes 2. Co-Morbidities requiring supervision/potential complications: dm 2, copd, left knee pain, cad 3. Due to bladder management, bowel management, safety, skin/wound care, disease  management, medication administration, pain management and patient education, does the patient require 24 hr/day rehab nursing? Yes 4. Does the patient require coordinated care of a physician, rehab nurse, PT (1-2 hrs/day, 5 days/week), OT (1-2 hrs/day, 5 days/week) and SLP (1-2 hrs/day, 5 days/week) to address physical and functional deficits in the context of the above medical diagnosis(es)? Yes Addressing deficits in the following areas: balance, endurance, locomotion, strength, transferring, bowel/bladder control, bathing, dressing, feeding, grooming, toileting, cognition, speech, swallowing and psychosocial support 5. Can the patient actively participate in an intensive therapy program of at least 3 hrs of therapy per day at least 5 days per week? Yes 6. The  potential for patient to make measurable gains while on inpatient rehab is excellent 7. Anticipated functional outcomes upon discharge from inpatient rehab are supervision  with PT, supervision and min assist with OT, supervision and min assist with SLP. 8. Estimated rehab length of stay to reach the above functional goals is: 15-20 days 9. Does the patient have adequate social supports and living environment to accommodate these discharge functional goals? Yes 10. Anticipated D/C setting: Home 11. Anticipated post D/C treatments: HH therapy and Outpatient therapy 12. Overall Rehab/Functional Prognosis: excellent  RECOMMENDATIONS: This patient's condition is appropriate for continued rehabilitative care in the following setting: CIR Patient has agreed to participate in recommended program. Yes Note that insurance prior authorization may be required for reimbursement for recommended care.  Comment: Rehab Admissions Coordinator to follow up.  Thanks,  [name], MD, Mellody Drown     11/19/2014

## 2014-11-19 NOTE — H&P (Signed)
[facility] History and Physical  Patient: Chad Fuller  FBP:[id number]  DOB: Jun 07, 1926  DOS: the patient was seen and examined on 11/19/2014 PCP: Mathews Argyle, MD  Chief Complaint: Slurred speech and a fall  HPI: [name] is a 80 y.o. male with Past medical history of diabetes mellitus, non-small cell lung cancer status post resection, coronary artery disease, prostate enlargement, left foot ingrowing toe.. The patient presented with an episode of dizziness and slurred speech. He mentions that at around 2 PM he noted that he was having difficulty standing with dizziness he felt unsteady and had a fall hitting right side of his forehead as well as right elbow. He called his son as he was not feeling good felt that the patient was having some slurred speech. At around 4 PM and son came to house he found that he continues to have slurred speech and brought him here for further workup. At the time of my evaluation family mentions the patient's still not at his baseline. Patient denied any other complaint including fever, chills, chest pain, shortness of breath, cough, nausea, vomiting, abdominal pain, diarrhea, burning urination, any other focal deficit. He mentions that since last one month he has been having on and off episodes of dizziness and therefore he has stopped taking many of his medications 2 weeks ago. He was also having diarrhea with Protonix and he stopped taking that as well. He informed his PCP last week about his stopping his medication and was recommended to continue with the same due to his symptoms.  The patient is coming from home. And at his baseline independent for most of his ADL.  Review of Systems: as mentioned in the history of present illness.  A Comprehensive review of the other systems is negative.  Past Medical History  Diagnosis Date  . Diabetes mellitus   . Cancer     lung  . Prostate enlargement   . History of blood clots   . CAD  (coronary artery disease)   . Pulmonary embolus july 2005   Past Surgical History  Procedure Laterality Date  . Lung removal, partial    . Coronary stent placement    . Knee surgery    . Filtering procedure      reports filter placed after knee surgery for blood clots  . I&d extremity  08/16/2012    Procedure: MINOR IRRIGATION AND DEBRIDEMENT EXTREMITY;  Surgeon: Tennis Must, MD;  Location: [facility];  Service: Orthopedics;  Laterality: Right;   Social History:  reports that he quit smoking about 40 years ago. His smoking use included Cigarettes. He smoked 0.00 packs per day. He does not have any smokeless tobacco history on file. He reports that he does not drink alcohol or use illicit drugs.  No Known Allergies  No family history on file.  Prior to Admission medications   Medication Sig Start Date End Date Taking? Authorizing Provider  GLUCOSAMINE CHONDROITIN COMPLX PO Take 2 tablets by mouth daily.     Yes Historical Provider, MD  insulin glargine (LANTUS) 100 UNIT/ML injection Inject 70 Units into the skin daily.   Yes Historical Provider, MD  metFORMIN (GLUCOPHAGE-XR) 500 MG 24 hr tablet Take 500 mg by mouth daily with breakfast.   Yes Historical Provider, MD  Multiple Vitamins-Minerals (MULTIVITAMINS THER. W/MINERALS) TABS Take 1 tablet by mouth daily.     Yes Historical Provider, MD  silver sulfADIAZINE (SILVADENE) 1 % cream Apply 1 application topically daily. 11/09/14  Yes Bronson Ing, DPM  ACCU-CHEK AVIVA PLUS test strip  01/27/13   Historical Provider, MD  ezetimibe (ZETIA) 10 MG tablet Take 10 mg by mouth daily.    Historical Provider, MD  finasteride (PROSCAR) 5 MG tablet Take 5 mg by mouth daily.      Historical Provider, MD  tamsulosin (FLOMAX) 0.4 MG CAPS capsule  09/02/13   Historical Provider, MD    Physical Exam: Filed Vitals:   11/18/14 2300 11/19/14 0023 11/19/14 0025 11/19/14 0030  BP: 139/76 150/90  144/82  Pulse: 79  74 74  Temp:       Resp: 19  20 15   Height:      Weight:      SpO2: 95%  95% 95%    General: Alert, Awake and Oriented to Time, Place and Person. Appear in mild distress Right scalp hematoma no external bleeding or laceration Eyes: PERRL ENT: Oral Mucosa clear moist. Neck: no JVD Cardiovascular: S1 and S2 Present, aortic systolic Murmur, Peripheral Pulses Present Respiratory: Bilateral Air entry equal and Decreased, no Crackles, occasionalzes Abdomen: Bowel Sound present soft and non-tender Skin: no Rash Extremities: no Pedal edema, no calf tenderness Neurologic: Grossly no focal neuro deficit other than slurred speech and past-pointing on the right side   Labs on Admission:  CBC:  Recent Labs Lab 11/18/14 2045  WBC 7.0  NEUTROABS 5.1  HGB 14.2  HCT 41.1  MCV 90.9  PLT 217    CMP     Component Value Date/Time   NA 138 11/18/2014 2045   NA 142 02/21/2014 0827   K 4.0 11/18/2014 2045   K 4.3 02/21/2014 0827   CL 98 11/18/2014 2045   CL 101 02/22/2013 1415   CO2 26 11/18/2014 2045   CO2 31* 02/21/2014 0827   GLUCOSE 248* 11/18/2014 2045   GLUCOSE 159* 02/21/2014 0827   GLUCOSE 254* 02/22/2013 1415   BUN 15 11/18/2014 2045   BUN 13.8 02/21/2014 0827   CREATININE 0.69 11/18/2014 2045   CREATININE 0.8 02/21/2014 0827   CALCIUM 9.6 11/18/2014 2045   CALCIUM 9.5 02/21/2014 0827   PROT 6.7 11/18/2014 2045   PROT 6.8 02/21/2014 0827   ALBUMIN 3.7 11/18/2014 2045   ALBUMIN 3.7 02/21/2014 0827   AST 22 11/18/2014 2045   AST 22 02/21/2014 0827   ALT 16 11/18/2014 2045   ALT 17 02/21/2014 0827   ALKPHOS 104 11/18/2014 2045   ALKPHOS 93 02/21/2014 0827   BILITOT 0.5 11/18/2014 2045   BILITOT 0.79 02/21/2014 0827   GFRNONAA 84* 11/18/2014 2045   GFRAA >90 11/18/2014 2045    No results for input(s): LIPASE, AMYLASE in the last 168 hours. No results for input(s): AMMONIA in the last 168 hours.   Recent Labs Lab 11/18/14 2045  TROPONINI <0.30   BNP (last 3 results) No  results for input(s): PROBNP in the last 8760 hours.  Radiological Exams on Admission: Dg Chest 2 View  11/18/2014   CLINICAL DATA:  Dizzy, fall.  Slurred speech  EXAM: CHEST  2 VIEW  COMPARISON:  Radiograph 02/21/2014  FINDINGS: Normal cardiac silhouette. Right-sided pulmonary scarring again demonstrated and not changed from CT 08/05/2012. Lungs are hyperinflated. Degenerate spurring of the spine.  IMPRESSION: 1. No acute cardiopulmonary findings. 2. Chronic scarring and nodularity in the right upper lobe.   Electronically Signed   By: Suzy Bouchard M.D.   On: 11/18/2014 21:54   Ct Head Wo Contrast  11/18/2014   CLINICAL DATA:  Dizziness with fall. Mild headache and right forehead bruising. Initial encounter  EXAM: CT HEAD WITHOUT CONTRAST  CT CERVICAL SPINE WITHOUT CONTRAST  TECHNIQUE: Multidetector CT imaging of the head and cervical spine was performed following the standard protocol without intravenous contrast. Multiplanar CT image reconstructions of the cervical spine were also generated.  COMPARISON:  None.  FINDINGS: CT HEAD FINDINGS  Skull and Sinuses:Right forehead contusion.  No underlying fracture.  The mastoids and paranasal sinuses are essentially clear.  Orbits: No traumatic findings.  Bilateral cataract resection.  Brain: No evidence of acute abnormality, such as acute infarction, hemorrhage, hydrocephalus, or mass lesion/mass effect. There is generalized brain atrophy. Tortuous vertebrobasilar system, usually indicating chronic hypertension.  CT CERVICAL SPINE FINDINGS  No acute fracture or malalignment. No gross cervical canal hematoma or prevertebral edema.  Advanced degenerative disc disease throughout the cervical spine. Short pedicles, posterior disc osteophyte complexes, and posterior ligamentous thickening cause spinal canal stenosis from C3-4 to C5-6.  IMPRESSION: 1. No acute intracranial or cervical spine finding. 2. Generalized brain atrophy. 3. Degenerative disc disease with  mid cervical canal stenosis.   Electronically Signed   By: Jorje Guild M.D.   On: 11/18/2014 23:54   Ct Cervical Spine Wo Contrast  11/18/2014   CLINICAL DATA:  Dizziness with fall. Mild headache and right forehead bruising. Initial encounter  EXAM: CT HEAD WITHOUT CONTRAST  CT CERVICAL SPINE WITHOUT CONTRAST  TECHNIQUE: Multidetector CT imaging of the head and cervical spine was performed following the standard protocol without intravenous contrast. Multiplanar CT image reconstructions of the cervical spine were also generated.  COMPARISON:  None.  FINDINGS: CT HEAD FINDINGS  Skull and Sinuses:Right forehead contusion.  No underlying fracture.  The mastoids and paranasal sinuses are essentially clear.  Orbits: No traumatic findings.  Bilateral cataract resection.  Brain: No evidence of acute abnormality, such as acute infarction, hemorrhage, hydrocephalus, or mass lesion/mass effect. There is generalized brain atrophy. Tortuous vertebrobasilar system, usually indicating chronic hypertension.  CT CERVICAL SPINE FINDINGS  No acute fracture or malalignment. No gross cervical canal hematoma or prevertebral edema.  Advanced degenerative disc disease throughout the cervical spine. Short pedicles, posterior disc osteophyte complexes, and posterior ligamentous thickening cause spinal canal stenosis from C3-4 to C5-6.  IMPRESSION: 1. No acute intracranial or cervical spine finding. 2. Generalized brain atrophy. 3. Degenerative disc disease with mid cervical canal stenosis.   Electronically Signed   By: Jorje Guild M.D.   On: 11/18/2014 23:54    EKG: Independently reviewed. normal sinus rhythm, nonspecific ST and T waves changes.  Assessment/Plan Principal Problem:   TIA (transient ischemic attack) Active Problems:   Coronary atherosclerosis   Type 2 diabetes mellitus   Aortic heart murmur   Non-small cell carcinoma of lung   Recurrent falls   Dizziness and giddiness   1. TIA (transient ischemic  attack) The patient is presenting with complaints of slurred speech and a fall. CT of the head as well as cervical spine are negative for any acute abnormality. Patient does not appear to have any significant neurological deficit other than his each slurring. With this the patient did past swallowing test and did not have any aspiration. At present he would be admitted in telemetry unit for observation. I would monitor serial neuro checks, MRI/MRA brain, echocardiogram in the morning. Neurology has been consulted.  2. Aortic heart murmur. Echogram in the morning.  3. Diabetes mellitus. Continuing his home insulin 70 units and placing him on sliding scale.  4. BPH. Patient has stopped taking his medications for BPH. At present at this time and continue with finasteride.  Advance goals of care discussion: Full code as per my discussion with patient    Consults: Neurology, appreciate input DVT Prophylaxis: subcutaneous Heparin Nutrition: Nothing by mouth until stroke evaluation Family Communication: Family was present at bedside, opportunity was given to ask question and all questions were answered satisfactorily at the time of interview. Disposition: Admitted to observation  in telemetry unit.  Author: Berle Mull, MD [facility] Pager: [phone] 11/19/2014, 1:54 AM    If 7PM-7AM, please contact night-coverage [email or web address] Password [email or web address]

## 2014-11-19 NOTE — Progress Notes (Signed)
Pt arrived to 4N14 via stretcher. Pt slide for stretcher to bed with minimal assist.  Pt and family oriented to room and call bell. Vitals obtained and are stable. Bed alarm activated. Pt resting comfortably.

## 2014-11-19 NOTE — Progress Notes (Signed)
UR completed 

## 2014-11-19 NOTE — Plan of Care (Signed)
Problem: Progression Outcomes Goal: Initial discharge plan initiated Outcome: Completed/Met Date Met:  11/19/14

## 2014-11-19 NOTE — Plan of Care (Signed)
Problem: Progression Outcomes Goal: Pain controlled Outcome: Completed/Met Date Met:  11/19/14

## 2014-11-19 NOTE — Progress Notes (Signed)
Occupational Therapy Evaluation Patient Details Name: Chad Fuller MRN: [id number] DOB: Dec 17, 1926 Today's Date: 11/19/2014    History of Present Illness 80 yo male admitted from home due to slurred speech, dizziness/ light headed, mild headache with R forehead bruise. pt s/p multiple fall. Baseline independent with ADLS PMH: DM, lung CA, CAD, PE, Lung partial removal, Knee surg, Bell's Pals. 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    Clinical Impression   Pt currently with functional limitiations due to the deficits listed below (see OT problem list). Pt with R hemiparesis, balance deficits with significant right lateral lean impacting safety with mobility and ADLs. Pt actively using RUE without cueing and is motivated to return PLOF. Pt will benefit from continued skilled OT to increase independence and safety with adls and balance.     Follow Up Recommendations  CIR;Supervision/Assistance - 24 hour    Equipment Recommendations  3 in 1 bedside comode;Tub/shower bench;Other (comment) (RW--2 wheeled)    Recommendations for Other Services Rehab consult     Precautions / Restrictions Precautions Precautions: Fall Restrictions Weight Bearing Restrictions: No      Mobility Bed Mobility Overal bed mobility: Needs Assistance Bed Mobility: Supine to Sit     Supine to sit: Supervision     General bed mobility comments: HOB flat; minimal use of bedrails.  Transfers Overall transfer level: Needs assistance Equipment used: Rolling walker (2 wheeled) Transfers: Sit to/from Stand Sit to Stand: Min guard         General transfer comment: Min guard (A) for balance/safety. Pt with decr RUE strength and R knee buckling. Pt also demonstrating slight R lateral lean upon standing    Balance Overall balance assessment: Needs assistance;History of Falls Sitting-balance support: No upper extremity supported;Feet supported Sitting balance-Leahy Scale:  Fair Sitting balance - Comments: Able to demonstrate good L lateral lean. Unable to complete safe R lateral lean   Standing balance support: Bilateral upper extremity supported;During functional activity Standing balance-Leahy Scale: Poor Standing balance comment: Heavy R lateral lean while standing at sink for oral care. Required mod (A) to keep upright position. Tacile cues provided to straighten R knee and stand up tall.                            ADL Overall ADL's : Needs assistance/impaired Eating/Feeding: Supervision/ safety;Sitting Eating/Feeding Details (indicate cue type and reason): Using R hand, pt able to drink thickened cranberry juice with supervision. No choking observed Grooming: Oral care;Moderate assistance;Cueing for safety;Sitting                   Toilet Transfer: Minimal assistance;Cueing for safety;Ambulation;Regular Toilet;RW Toilet Transfer Details (indicate cue type and reason): Min (A) for safe descend. Verbal cues for hand placement on RW. Toileting- Clothing Manipulation and Hygiene: Min guard;Sitting/lateral lean Toileting - Clothing Manipulation Details (indicate cue type and reason): Pt demonstrating good L lateral lean.     Functional mobility during ADLs: Moderate assistance;Cueing for safety;Rolling walker General ADL Comments: Impulsive. Mod (A) for ambulation due to R foot dragging, R knee buckling, and R lateral lean. Pt demonstrating significant R lateral  lean during static and dynamic balance activities. Had to sit pt down to complete oral care for safety. Pt actively using and manipulating objects with R hand without verbal or tactile cues.      Vision Eye Alignment: Within Functional Limits Alignment/Gaze Preference: Head turned Ocular Range of Motion: Within  Functional Limits Tracking/Visual Pursuits: Decreased smoothness of horizontal tracking;Decreased smoothness of vertical tracking;Requires cues, head turns, or add eye  shifts to track   Convergence: Within functional limits         Perception     Praxis      Pertinent Vitals/Pain Pain Assessment: No/denies pain     Hand Dominance Right   Extremity/Trunk Assessment Upper Extremity Assessment Upper Extremity Assessment: RUE deficits/detail;Generalized weakness RUE Deficits / Details: Weaker grip, decr gross motor strength, decr shoulder ROM ~ 100 degrees RUE Coordination: decreased fine motor;decreased gross motor   Lower Extremity Assessment Lower Extremity Assessment: Defer to PT evaluation;RLE deficits/detail RLE Deficits / Details: Knee buckling; foot dragging    Cervical / Trunk Assessment Cervical / Trunk Assessment: Kyphotic   Communication Communication Communication: No difficulties   Cognition Arousal/Alertness: Awake/alert Behavior During Therapy: Impulsive Overall Cognitive Status: Within Functional Limits for tasks assessed                     General Comments       Exercises       Shoulder Instructions      Home Living Family/patient expects to be discharged to:: Private residence   Available Help at Discharge: Family               Bathroom Shower/Tub: Tub/shower unit;Curtain Shower/tub characteristics: Curtain       Home Equipment: Cane - single point;Walker - 4 wheels          Prior Functioning/Environment               OT Diagnosis: Generalized weakness;Hemiplegia dominant side   OT Problem List: Decreased strength;Decreased range of motion;Decreased activity tolerance;Impaired balance (sitting and/or standing);Decreased coordination;Decreased safety awareness;Decreased knowledge of use of DME or AE;Decreased knowledge of precautions   OT Treatment/Interventions: Self-care/ADL training;Therapeutic exercise;DME and/or AE instruction;Therapeutic activities;Balance training;Patient/family education    OT Goals(Current goals can be found in the care plan section) Acute Rehab OT  Goals Patient Stated Goal: to be able to walk again on my own OT Goal Formulation: With patient Time For Goal Achievement: 12/03/14 Potential to Achieve Goals: Good ADL Goals Pt Will Perform Grooming: with min guard assist;standing Pt Will Perform Upper Body Bathing: with supervision;sitting Pt Will Perform Lower Body Bathing: with supervision;sitting/lateral leans Pt Will Transfer to Toilet: with min guard assist;ambulating;regular height toilet Pt Will Perform Toileting - Clothing Manipulation and hygiene: with min guard assist;sit to/from stand Pt Will Perform Tub/Shower Transfer: Tub transfer;with min guard assist;ambulating;tub bench;rolling walker  OT Frequency: Min 3X/week   Barriers to D/C: Decreased caregiver support  Pt lives alone. Will need to confirm d/c plans with pt's children.       Co-evaluation              End of Session Equipment Utilized During Treatment: Gait belt;Rolling walker Nurse Communication: Mobility status;Precautions  Activity Tolerance: Patient tolerated treatment well Patient left: in chair;with call bell/phone within reach;with chair alarm set;with family/visitor present   Time: 1779-3903 OT Time Calculation (min): 42 min Charges:    G-Codes:    Redmond Baseman Dec 10, 2014, 4:24 PM

## 2014-11-19 NOTE — Progress Notes (Signed)
Rehab Admissions Coordinator Note:  Patient was screened by Retta Diones for appropriateness for an Inpatient Acute Rehab Consult.  At this time, patient is listed as "observation" status.  At this time, he does not meet medical necessity requirement for an inpatient rehab admission.  If he should change to inpatient status, then could consider for rehab.  Call me for questions.     Retta Diones 11/19/2014, 11:24 AM  I can be reached at [phone].

## 2014-11-19 NOTE — Evaluation (Addendum)
Physical Therapy Evaluation Patient Details Name: Chad Fuller MRN: [id number] DOB: 1926/02/07 Today's Date: 11/19/2014   History of Present Illness  80 yo male admitted from home due to slurred speech, dizziness/ light headed, mild headache with R forehead bruise. pt s/p multiple fall. Baseline independent with ADLS PMH: DM, lung CA, CAD, PE, Lung partial removal, Knee surg, Bell's Pals. 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     Clinical Impression  Patient presents with functional limitations due to deficits listed in PT problem list (see below). Pt with right hemiparesis, balance deficits and impulsiveness impacting safe mobility. Pt not safe to return home alone due to LOB x2 during gait and dynamic standing activities. Pt motivated to return to PLOF. Pt would benefit from skilled PT to improve transfers, gait, balance and mobility so pt can maximize independence and minimize fall risk prior to return home.    Follow Up Recommendations CIR;Supervision/Assistance - 24 hour    Equipment Recommendations  Other (comment) (defer to next venue)    Recommendations for Other Services OT consult     Precautions / Restrictions Precautions Precautions: Fall Precaution Comments: multiple falls at home Restrictions Weight Bearing Restrictions: No      Mobility  Bed Mobility Overal bed mobility: Needs Assistance Bed Mobility: Supine to Sit     Supine to sit: Supervision;HOB elevated     General bed mobility comments: Heavy use of rails. Increased time and effort.  Transfers Overall transfer level: Needs assistance Equipment used: None Transfers: Sit to/from Stand Sit to Stand: Min assist         General transfer comment: Min A to rise from EOB and toilet. Impulsive. Transferred to chair. VC's to reach back for surface.  Ambulation/Gait Ambulation/Gait assistance: Mod assist Ambulation Distance (Feet): 16 Feet (x2 bouts) Assistive device: 1  person hand held assist Gait Pattern/deviations: Step-through pattern;Decreased stride length;Decreased dorsiflexion - right Gait velocity: decreased Gait velocity interpretation: Below normal speed for age/gender General Gait Details: Knee buckling of RLE taking first step as pt impulsive. Decreased DF right foot resulting in dragging RLE during swing phase. VC's to pick up RLE to advance to prevent tripping. Increased forward momentum due to impulsivity resulting in LOB x2 forward with mod A to prevent fall.  Stairs            Wheelchair Mobility    Modified Rankin (Stroke Patients Only) Modified Rankin (Stroke Patients Only) Pre-Morbid Rankin Score: Moderate disability Modified Rankin: Moderately severe disability     Balance Overall balance assessment: Needs assistance;History of Falls Sitting-balance support: Feet supported;No upper extremity supported Sitting balance-Leahy Scale: Fair Sitting balance - Comments: Posterior lean during LE assessment. Postural control: Posterior lean Standing balance support: During functional activity Standing balance-Leahy Scale: Poor Standing balance comment: Requires Min A during dynamic standing activities - washing hands at sink. LOB x2 to the right and forward with reaching outside BoS.                             Pertinent Vitals/Pain Pain Assessment: No/denies pain    Home Living Family/patient expects to be discharged to:: Private residence Living Arrangements: Alone   Type of Home: House Home Access: Stairs to enter Entrance Stairs-Rails: Right Entrance Stairs-Number of Steps: 4 Home Layout: One level Home Equipment: Cane - single point      Prior Function Level of Independence: Independent  Comments: Pt reports doing some cooking, driving and independent for ADLs. Reports, "throwing himself up the steps."     Hand Dominance        Extremity/Trunk Assessment   Upper Extremity Assessment:  Defer to OT evaluation;Generalized weakness;RUE deficits/detail (RUE generally weaker then LUE. Weak grip.)           Lower Extremity Assessment: RLE deficits/detail;Generalized weakness RLE Deficits / Details: Grossly ~2+/5 hip flexion, knee flexion and 3/5 ankle DF.       Communication   Communication: No difficulties (Mild slurred speech noted however difficult to tell as baseline unknown.) HOH  Cognition Arousal/Alertness: Awake/alert Behavior During Therapy: Impulsive Overall Cognitive Status: Within Functional Limits for tasks assessed                      General Comments      Exercises        Assessment/Plan    PT Assessment Patient needs continued PT services  PT Diagnosis Difficulty walking;Generalized weakness   PT Problem List Decreased strength;Decreased balance;Decreased mobility;Decreased safety awareness  PT Treatment Interventions Balance training;Gait training;DME instruction;Neuromuscular re-education;Stair training;Patient/family education;Functional mobility training;Therapeutic activities;Therapeutic exercise   PT Goals (Current goals can be found in the Care Plan section) Acute Rehab PT Goals Patient Stated Goal: to be able to walk again on my own PT Goal Formulation: With patient Time For Goal Achievement: 12/03/14 Potential to Achieve Goals: Good    Frequency Min 4X/week   Barriers to discharge Decreased caregiver support Pt lives alone at home and has steps to negotiate to get into home.    Co-evaluation               End of Session Equipment Utilized During Treatment: Gait belt Activity Tolerance: Patient tolerated treatment well Patient left: in chair;with call bell/phone within reach;with nursing/sitter in room (Chair alarm pad left in room, informed RN, JOni, in room to place it after pt done eating.) Nurse Communication: Mobility status    Functional Assessment Tool Used: Clinical judgment Functional Limitation:  Mobility: Walking and moving around Mobility: Walking and Moving Around Current Status (S2876): At least 20 percent but less than 40 percent impaired, limited or restricted Mobility: Walking and Moving Around Goal Status [phone]): At least 1 percent but less than 20 percent impaired, limited or restricted    Time: 0923-0945 PT Time Calculation (min) (ACUTE ONLY): 22 min   Charges:   PT Evaluation $Initial PT Evaluation Tier I: 1 Procedure PT Treatments $Gait Training: 8-22 mins   PT G Codes:   Functional Assessment Tool Used: Clinical judgment Functional Limitation: Mobility: Walking and moving around    [place], [facility] A 11/19/2014, 10:32 AM Candy Sledge, PT, DPT [phone]

## 2014-11-19 NOTE — Plan of Care (Signed)
Problem: Progression Outcomes Goal: Rehab Team goals identified Outcome: Completed/Met Date Met:  11/19/14

## 2014-11-19 NOTE — Progress Notes (Signed)
Echo Lab  2D Echocardiogram completed.  Hali Balgobin L Georgiann Neider, RDCS 11/19/2014 11:35 AM

## 2014-11-20 ENCOUNTER — Inpatient Hospital Stay (HOSPITAL_COMMUNITY)
Admission: RE | Admit: 2014-11-20 | Discharge: 2014-12-05 | DRG: 057 | Disposition: A | Discharge status: Skilled Nursing Facility | Payer: Medicare Other | Source: Intra-hospital | Attending: Physical Medicine & Rehabilitation | Admitting: Physical Medicine & Rehabilitation

## 2014-11-20 DIAGNOSIS — I69391 Dysphagia following cerebral infarction: Secondary | ICD-10-CM

## 2014-11-20 DIAGNOSIS — E11649 Type 2 diabetes mellitus with hypoglycemia without coma: Secondary | ICD-10-CM | POA: Diagnosis not present

## 2014-11-20 DIAGNOSIS — R1310 Dysphagia, unspecified: Secondary | ICD-10-CM | POA: Diagnosis present

## 2014-11-20 DIAGNOSIS — I639 Cerebral infarction, unspecified: Secondary | ICD-10-CM | POA: Diagnosis not present

## 2014-11-20 DIAGNOSIS — G8190 Hemiplegia, unspecified affecting unspecified side: Secondary | ICD-10-CM | POA: Diagnosis not present

## 2014-11-20 DIAGNOSIS — I6350 Cerebral infarction due to unspecified occlusion or stenosis of unspecified cerebral artery: Secondary | ICD-10-CM

## 2014-11-20 DIAGNOSIS — E1140 Type 2 diabetes mellitus with diabetic neuropathy, unspecified: Secondary | ICD-10-CM | POA: Diagnosis present

## 2014-11-20 DIAGNOSIS — I69322 Dysarthria following cerebral infarction: Secondary | ICD-10-CM | POA: Diagnosis not present

## 2014-11-20 DIAGNOSIS — E119 Type 2 diabetes mellitus without complications: Secondary | ICD-10-CM | POA: Diagnosis not present

## 2014-11-20 DIAGNOSIS — Z9181 History of falling: Secondary | ICD-10-CM | POA: Diagnosis not present

## 2014-11-20 DIAGNOSIS — I69351 Hemiplegia and hemiparesis following cerebral infarction affecting right dominant side: Secondary | ICD-10-CM | POA: Diagnosis not present

## 2014-11-20 DIAGNOSIS — G4700 Insomnia, unspecified: Secondary | ICD-10-CM | POA: Diagnosis present

## 2014-11-20 DIAGNOSIS — G8191 Hemiplegia, unspecified affecting right dominant side: Secondary | ICD-10-CM

## 2014-11-20 DIAGNOSIS — I69390 Apraxia following cerebral infarction: Secondary | ICD-10-CM | POA: Diagnosis not present

## 2014-11-20 DIAGNOSIS — K219 Gastro-esophageal reflux disease without esophagitis: Secondary | ICD-10-CM | POA: Diagnosis present

## 2014-11-20 DIAGNOSIS — E1142 Type 2 diabetes mellitus with diabetic polyneuropathy: Secondary | ICD-10-CM | POA: Diagnosis not present

## 2014-11-20 DIAGNOSIS — I2510 Atherosclerotic heart disease of native coronary artery without angina pectoris: Secondary | ICD-10-CM | POA: Diagnosis present

## 2014-11-20 DIAGNOSIS — Z87891 Personal history of nicotine dependence: Secondary | ICD-10-CM | POA: Diagnosis not present

## 2014-11-20 DIAGNOSIS — N400 Enlarged prostate without lower urinary tract symptoms: Secondary | ICD-10-CM | POA: Diagnosis present

## 2014-11-20 DIAGNOSIS — Z86711 Personal history of pulmonary embolism: Secondary | ICD-10-CM | POA: Diagnosis not present

## 2014-11-20 DIAGNOSIS — R300 Dysuria: Secondary | ICD-10-CM | POA: Diagnosis not present

## 2014-11-20 DIAGNOSIS — I69393 Ataxia following cerebral infarction: Secondary | ICD-10-CM | POA: Diagnosis not present

## 2014-11-20 DIAGNOSIS — R262 Difficulty in walking, not elsewhere classified: Secondary | ICD-10-CM | POA: Diagnosis not present

## 2014-11-20 DIAGNOSIS — H9190 Unspecified hearing loss, unspecified ear: Secondary | ICD-10-CM | POA: Diagnosis present

## 2014-11-20 DIAGNOSIS — I10 Essential (primary) hypertension: Secondary | ICD-10-CM

## 2014-11-20 DIAGNOSIS — I69951 Hemiplegia and hemiparesis following unspecified cerebrovascular disease affecting right dominant side: Secondary | ICD-10-CM | POA: Diagnosis not present

## 2014-11-20 DIAGNOSIS — I6302 Cerebral infarction due to thrombosis of basilar artery: Secondary | ICD-10-CM

## 2014-11-20 DIAGNOSIS — E1165 Type 2 diabetes mellitus with hyperglycemia: Secondary | ICD-10-CM | POA: Diagnosis not present

## 2014-11-20 DIAGNOSIS — I69991 Dysphagia following unspecified cerebrovascular disease: Secondary | ICD-10-CM | POA: Diagnosis not present

## 2014-11-20 DIAGNOSIS — M549 Dorsalgia, unspecified: Secondary | ICD-10-CM | POA: Diagnosis present

## 2014-11-20 LAB — GLUCOSE, CAPILLARY
GLUCOSE-CAPILLARY: 148 mg/dL — AB (ref 70–99)
GLUCOSE-CAPILLARY: 197 mg/dL — AB (ref 70–99)
Glucose-Capillary: 288 mg/dL — ABNORMAL HIGH (ref 70–99)
Glucose-Capillary: 254 mg/dL — ABNORMAL HIGH (ref 70–99)

## 2014-11-20 LAB — URINE CULTURE
CULTURE: NO GROWTH
Colony Count: NO GROWTH

## 2014-11-20 MED ORDER — SENNOSIDES-DOCUSATE SODIUM 8.6-50 MG PO TABS
1.0000 | ORAL_TABLET | Freq: Every evening | ORAL | Status: DC | PRN
  Administered 2014-11-28: 1 via ORAL
  Filled 2014-11-20: qty 1

## 2014-11-20 MED ORDER — ONDANSETRON HCL 4 MG PO TABS
4.0000 mg | ORAL_TABLET | Freq: Four times a day (QID) | ORAL | Status: DC | PRN
  Administered 2014-11-27: 4 mg via ORAL
  Filled 2014-11-20: qty 1

## 2014-11-20 MED ORDER — ACETAMINOPHEN 325 MG PO TABS
325.0000 mg | ORAL_TABLET | ORAL | Status: DC | PRN
  Filled 2014-11-20: qty 2

## 2014-11-20 MED ORDER — FAMOTIDINE 10 MG PO TABS
10.0000 mg | ORAL_TABLET | Freq: Two times a day (BID) | ORAL | Status: DC
  Administered 2014-11-20 – 2014-12-03 (×26): 10 mg via ORAL
  Filled 2014-11-20 (×33): qty 1

## 2014-11-20 MED ORDER — ONDANSETRON HCL 4 MG/2ML IJ SOLN: 4.0000 mg | Freq: Four times a day (QID) | INTRAMUSCULAR | Status: DC | PRN

## 2014-11-20 MED ORDER — STARCH (THICKENING) PO POWD
ORAL | Status: DC | PRN
  Filled 2014-11-20: qty 227

## 2014-11-20 MED ORDER — GUAIFENESIN-DM 100-10 MG/5ML PO SYRP: 5.0000 mL | ORAL_SOLUTION | Freq: Four times a day (QID) | ORAL | Status: DC | PRN

## 2014-11-20 MED ORDER — TAMSULOSIN HCL 0.4 MG PO CAPS
0.4000 mg | ORAL_CAPSULE | Freq: Every day | ORAL | Status: DC
  Administered 2014-11-20: 0.4 mg via ORAL
  Filled 2014-11-20 (×2): qty 1

## 2014-11-20 MED ORDER — METFORMIN HCL 500 MG PO TABS
500.0000 mg | ORAL_TABLET | Freq: Two times a day (BID) | ORAL | Status: DC
  Administered 2014-11-21 – 2014-11-24 (×7): 500 mg via ORAL
  Filled 2014-11-20 (×9): qty 1

## 2014-11-20 MED ORDER — INSULIN GLARGINE 100 UNIT/ML ~~LOC~~ SOLN
70.0000 [IU] | Freq: Every day | SUBCUTANEOUS | Status: DC
  Administered 2014-11-21 – 2014-11-25 (×5): 70 [IU] via SUBCUTANEOUS
  Filled 2014-11-20 (×7): qty 0.7

## 2014-11-20 MED ORDER — ASPIRIN EC 325 MG PO TBEC
325.0000 mg | DELAYED_RELEASE_TABLET | Freq: Every day | ORAL | Status: DC
  Administered 2014-11-21 – 2014-12-05 (×15): 325 mg via ORAL
  Filled 2014-11-20 (×16): qty 1

## 2014-11-20 MED ORDER — FINASTERIDE 5 MG PO TABS
5.0000 mg | ORAL_TABLET | Freq: Every day | ORAL | Status: DC
  Administered 2014-11-21 – 2014-12-05 (×15): 5 mg via ORAL
  Filled 2014-11-20 (×16): qty 1

## 2014-11-20 MED ORDER — INSULIN ASPART 100 UNIT/ML ~~LOC~~ SOLN
0.0000 [IU] | Freq: Three times a day (TID) | SUBCUTANEOUS | Status: DC
  Administered 2014-11-21: 8 [IU] via SUBCUTANEOUS
  Administered 2014-11-21: 2 [IU] via SUBCUTANEOUS
  Administered 2014-11-21 – 2014-11-22 (×2): 5 [IU] via SUBCUTANEOUS
  Administered 2014-11-22: 8 [IU] via SUBCUTANEOUS
  Administered 2014-11-23: 2 [IU] via SUBCUTANEOUS
  Administered 2014-11-23: 3 [IU] via SUBCUTANEOUS
  Administered 2014-11-23: 5 [IU] via SUBCUTANEOUS
  Administered 2014-11-24: 3 [IU] via SUBCUTANEOUS
  Administered 2014-11-24: 5 [IU] via SUBCUTANEOUS
  Administered 2014-11-25 – 2014-11-26 (×2): 2 [IU] via SUBCUTANEOUS
  Administered 2014-11-26: 3 [IU] via SUBCUTANEOUS
  Administered 2014-11-26: 2 [IU] via SUBCUTANEOUS
  Administered 2014-11-27 (×2): 3 [IU] via SUBCUTANEOUS

## 2014-11-20 MED ORDER — FLEET ENEMA 7-19 GM/118ML RE ENEM: 1.0000 | ENEMA | Freq: Once | RECTAL | Status: AC | PRN

## 2014-11-20 MED ORDER — EZETIMIBE 10 MG PO TABS
10.0000 mg | ORAL_TABLET | Freq: Every day | ORAL | Status: DC
  Administered 2014-11-21 – 2014-12-05 (×15): 10 mg via ORAL
  Filled 2014-11-20 (×16): qty 1

## 2014-11-20 MED ORDER — ALUM & MAG HYDROXIDE-SIMETH 200-200-20 MG/5ML PO SUSP
30.0000 mL | ORAL | Status: DC | PRN
  Administered 2014-11-25 – 2014-12-03 (×17): 30 mL via ORAL
  Filled 2014-11-20 (×18): qty 30

## 2014-11-20 MED ORDER — BISACODYL 10 MG RE SUPP: 10.0000 mg | Freq: Every day | RECTAL | Status: DC | PRN

## 2014-11-20 MED ORDER — ENOXAPARIN SODIUM 40 MG/0.4ML ~~LOC~~ SOLN
40.0000 mg | SUBCUTANEOUS | Status: DC
  Administered 2014-11-21 – 2014-12-05 (×15): 40 mg via SUBCUTANEOUS
  Filled 2014-11-20 (×15): qty 0.4

## 2014-11-20 MED ORDER — ENOXAPARIN SODIUM 30 MG/0.3ML ~~LOC~~ SOLN: 30.0000 mg | SUBCUTANEOUS | Status: DC

## 2014-11-20 MED ORDER — TRAZODONE HCL 50 MG PO TABS: 25.0000 mg | ORAL_TABLET | Freq: Every evening | ORAL | Status: DC | PRN

## 2014-11-20 NOTE — PMR Pre-admission (Signed)
PMR Admission Coordinator Pre-Admission Assessment  Patient: Chad Fuller is an 80 y.o., male MRN: [id number] DOB: 1926-12-24 Height: 6\' 2"  (188 cm) Weight: 99.791 kg (220 lb)              Insurance Information HMO:     PPO:      PCP:      IPA:      80/20:      OTHER:  PRIMARY:  Medicare  A and B      Policy#: [id number]      Subscriber:  self CM Name:       Phone#:      Fax#:  Pre-Cert#:       Employer: retired Benefits:  Phone #:      Name:  Eff. Date: 08/28/1993     Deduct:  $1260        Out of Pocket Max:       Life Max:  CIR: 100%      SNF:  100% first 20 days  Per medicare guidelines Outpatient:  80%     Co-Pay:  20% Home Health: 100%        Co-Pay:   DME:  80%     Co-Pay: 20% Providers:  Pt. choice SECONDARY:  [facility]       Policy#:  [id number]      Subscriber:  self     Emergency Contact Information Contact Information    Name Relation Home Work [place] Daughter [phone]     Chad Fuller, Chad Fuller   [phone]     Current Medical History  Patient Admitting Diagnosis: left paracentral pontine infarct History of Present Illness: Chad Fuller is an 80 year old male with history of DM type 2 with neuropathy, PE, NSCLC s/p resection, CAD who was admitted on 11/18/14 with slurred speech, dizziness and difficulty walking with fall. CT head without acute changes, generalized atrophy and right forehead contusion. CT cervical spine with stenosis C3/4 to C 5/6. MRI/MRA brain with Small acute brainstem infarct in the left paracentral pons and no significant stenosis. 2D echo with EF 60%, AV sclerosis and mitral annular calcification without stenosis. Carotid dopplers without significant stenosis. Neurology recommended ASA for thrombotic stroke due to SVD. Speech therapy evaluation with dysphagia and FEES done today and patient to continue on Dysphagia 3, nectar liquids by spoon. Patient with resultant ataxia, right sided weakness with RLE instability, dysphagia as well  as apraxia. Therapy ongoing and CIR recommended for follow up therapy Total: 8  NIH    Past Medical History  Past Medical History  Diagnosis Date  . Diabetes mellitus   . Cancer     lung  . Prostate enlargement   . History of blood clots   . CAD (coronary artery disease)   . Pulmonary embolus july 2005    Family History  family history is not on file.  Prior Rehab/Hospitalizations: prior OP rehab following L TKA   Current Medications  Current facility-administered medications: aspirin tablet 325 mg, 325 mg, Oral, Daily, Jonetta Osgood, MD, 325 mg at 11/20/14 1030;  enoxaparin (LOVENOX) injection 40 mg, 40 mg, Subcutaneous, Q24H, Berle Mull, MD, 40 mg at 11/20/14 1030;  ezetimibe (ZETIA) tablet 10 mg, 10 mg, Oral, Daily, Berle Mull, MD, 10 mg at 11/20/14 1030;  finasteride (PROSCAR) tablet 5 mg, 5 mg, Oral, Daily, Berle Mull, MD, 5 mg at 11/20/14 1030 food thickener (THICK IT) powder, , Oral, PRN, Jonetta Osgood, MD;  insulin aspart (  novoLOG) injection 0-15 Units, 0-15 Units, Subcutaneous, TID WC, Berle Mull, MD, 3 Units at 11/20/14 1159;  insulin glargine (LANTUS) injection 70 Units, 70 Units, Subcutaneous, Daily, Berle Mull, MD, 70 Units at 11/20/14 1342  Patients Current Diet: DIET DYS 3; nectar thick  Precautions / Restrictions Precautions Precautions: Fall Precaution Comments: multiple falls at home Restrictions Weight Bearing Restrictions: No   Prior Activity Level Community (5-7x/wk): Pt. still working PTA per grandson.  Pumps out grease traps at restaurants.   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 Devices/Equipment: Radio producer (specify quad or straight), Eyeglasses Home Equipment: Cane - single point, Environmental consultant - 4 wheels  Prior Functional Level Prior Function Level of Independence: Independent Comments: Pt reports doing some cooking, driving and independent for ADLs. Reports, "throwing himself up the  steps."  Current Functional Level Cognition  Overall Cognitive Status: Within Functional Limits for tasks assessed Orientation Level: Oriented to person, Oriented to time, Disoriented to place, Disoriented to situation    Extremity Assessment (includes Sensation/Coordination)          ADLs  Overall ADL's : Needs assistance/impaired Eating/Feeding: Supervision/ safety, Sitting Eating/Feeding Details (indicate cue type and reason): Using R hand, pt able to drink thickened cranberry juice with supervision. No choking observed Grooming: Oral care, Moderate assistance, Cueing for safety, Sitting Toilet Transfer: Minimal assistance, Cueing for safety, Ambulation, Regular Toilet, RW Toilet Transfer Details (indicate cue type and reason): Min (A) for safe descend. Verbal cues for hand placement on RW. Toileting- Water quality scientist and Hygiene: Min guard, Sitting/lateral lean Toileting - Clothing Manipulation Details (indicate cue type and reason): Pt demonstrating good L lateral lean. Functional mobility during ADLs: Moderate assistance, Cueing for safety, Rolling walker General ADL Comments: Impulsive. Mod (A) for ambulation due to R foot dragging, R knee buckling, and R lateral lean. Pt demonstrating significant R lateral  lean during static and dynamic balance activities. Had to sit pt down to complete oral care for safety. Pt actively using and manipulating objects with R hand without verbal or tactile cues.     Mobility  Overal bed mobility: Needs Assistance Bed Mobility: Supine to Sit Supine to sit: Supervision General bed mobility comments: Received in bathroom with tech finishing a shower.    Transfers  Overall transfer level: Needs assistance Equipment used: None Transfers: Sit to/from Stand Sit to Stand: Min guard General transfer comment: Min guard for steadying/balance upon standing due to impulsiveness. Multiple stand from recliner. No R knee buckling today. Able to stand  upright without R lateral lean.    Ambulation / Gait / Stairs / Wheelchair Mobility  Ambulation/Gait Ambulation/Gait assistance: Museum/gallery curator (Feet): 50 Feet (x3 bouts) Assistive device: 1 person hand held assist, None (rail.) Gait Pattern/deviations: Step-through pattern, Decreased stride length, Decreased dorsiflexion - right, Decreased weight shift to left Gait velocity: decreased Gait velocity interpretation: Below normal speed for age/gender General Gait Details: Required Min A (manual facilitation of hip flexors) to advance RLE during swing phase due to decreased DF resulting in dragging of RLE- most notable when fatigued. Use of rail for support. Multiple seated rest breaks. 1 LOB without rail due to impulsive behavior and forward momentum requring Min A to prevent fall.    Posture / Balance Dynamic Sitting Balance Sitting balance - Comments: Trunk control improved today, able to reach outside BoS without LOB.     Special needs/care consideration BiPAP/CPAP   no     Skin  Pt. Reports blister  on left great toe since several weeks ago; has seen MD for it; currently covered with bandaid                               Bowel mgmt:  Continent, last BM 11/18/14 Bladder mgmt: continent, using urinal Diabetic mgmt   yes     Previous Home Environment Living Arrangements: Alone Available Help at Discharge: Family Type of Home: House Home Layout: One level Home Access: Stairs to enter Entrance Stairs-Rails: Right Entrance Stairs-Number of Steps: 4 Bathroom Shower/Tub: Public librarian, [place]: No  Discharge Living Setting Plans for Discharge Living Setting: Patient's home Type of Home at Discharge: House Discharge Home Layout: One level Discharge Home Access: Stairs to enter Entrance Stairs-Rails: Left, Right Entrance Stairs-Number of Steps: 4 Discharge Bathroom Shower/Tub: Tub/shower unit Discharge Bathroom Toilet: Standard Discharge Bathroom  Accessibility: Yes How Accessible: Accessible via walker Does the patient have any problems obtaining your medications?: No  Social/Family/Support Systems Patient Roles: Parent (wife died 8 1/2 years ago) Anticipated Caregiver: multiple family members plan to rotate schedule; Eldest daughter   Chad Fuller is primary contacxt Anticipated Caregiver's Contact Information: Chad Fuller (eldest daughter) [phone] Ability/Limitations of Caregiver: family indicates they will arrange a schedule to be with their dad  24/7; .  All have physical limitationss due to their own health situations but believe they can manage up to min assisty  level. Caregiver Availability: 24/7 Discharge Plan Discussed with Primary Caregiver: Yes Is Caregiver In Agreement with Plan?: Yes Does Caregiver/Family have Issues with Lodging/Transportation while Pt is in Rehab?: No    Goals/Additional Needs Patient/Family Goal for Rehab: supervision PT; supervision to min assist for OT and SLP Expected length of stay: 15-20 days Cultural Considerations: "Production manager Needs: TBD Pt/Family Agrees to Admission and willing to participate: Yes Program Orientation Provided & Reviewed with Pt/Caregiver Including Roles  & Responsibilities: Yes   Decrease burden of Care through IP rehab admission:  no   Possible need for SNF placement upon discharge:  Not anticipated    Patient Condition: This patient's condition remains as documented in the consult dated 11/19/14, in which the Rehabilitation Physician determined and documented that the patient's condition is appropriate for intensive rehabilitative care in an inpatient rehabilitation facility. Will admit to inpatient rehab today.  Preadmission Screen Completed By:  Gerlean Ren, 11/20/2014 4:12 PM ______________________________________________________________________   Discussed status with Dr.  Naaman Plummer on 11/20/14 at  1623  and received telephone approval for admission  today.  Admission Coordinator:  Gerlean Ren, time [id number] /Date 11/20/14

## 2014-11-20 NOTE — Clinical Social Work Psychosocial (Signed)
Clinical Social Work Department BRIEF PSYCHOSOCIAL ASSESSMENT 11/20/2014  Patient:  Chad Fuller, Chad Fuller     Account Number:  [id number]     Admit date:  11/18/2014  Clinical Social Worker:  Glendon Axe, CLINICAL SOCIAL WORKER  Date/Time:  11/19/2014 03:21 PM  Referred by:  Physician  Date Referred:  11/19/2014 Referred for  SNF Placement   Other Referral:   Interview type:  Other - See comment Other interview type:   CSW spoke with pt and pt's daughters at bedside.    PSYCHOSOCIAL DATA Living Status:  ALONE Admitted from facility:   Level of care:   Primary support name:  Riley Churches Primary support relationship to patient:  CHILD, ADULT Degree of support available:   Strong    CURRENT CONCERNS Current Concerns  Post-Acute Placement   Other Concerns:    SOCIAL WORK ASSESSMENT / PLAN Clinical Social Worker met with pt and pt's family (3 daughters) in reference to an alternative plan for CIR. CSW presented SNF option and explained SNF process. Pt's family expressed a strong desire for IP Rehab. CSW submitted pt's clinicals and reviewed bed offers with family. FL-2 signed by MD. Per pt's chart, IP Rehab plans to admit pt today. CSW to follow for disposition.   Assessment/plan status:  Psychosocial Support/Ongoing Assessment of Needs Other assessment/ plan:   Information/referral to community resources:   SNF information/ list provided.    PATIENT'S/FAMILY'S RESPONSE TO PLAN OF CARE: Pt sitting at bedside, alert and oriented X4. Pt's family appreciated social work intervention. Pt and pt's family desires IP Rehab placement.     Glendon Axe, MSW, LCSWA [phone] 11/20/2014 3:32 PM

## 2014-11-20 NOTE — Procedures (Signed)
Objective Swallowing Evaluation: Fiberoptic Endoscopic Evaluation of Swallowing  Patient Details  Name: Chad Fuller MRN: [id number] Date of Birth: [birth year]  Today's Date: 11/20/2014 Time: 5366-4403 SLP Time Calculation (min) (ACUTE ONLY): 34 min  Past Medical History:  Past Medical History  Diagnosis Date  . Diabetes mellitus   . Cancer     lung  . Prostate enlargement   . History of blood clots   . CAD (coronary artery disease)   . Pulmonary embolus july 2005   Past Surgical History:  Past Surgical History  Procedure Laterality Date  . Lung removal, partial    . Coronary stent placement    . Knee surgery    . Filtering procedure      reports filter placed after knee surgery for blood clots  . I&d extremity  08/16/2012    Procedure: MINOR IRRIGATION AND DEBRIDEMENT EXTREMITY;  Surgeon: Tennis Must, MD;  Location: [facility];  Service: Orthopedics;  Laterality: Right;   HPI:  80 yo male admitted from home due to slurred speech, dizziness/ light headed, mild headache with R forehead bruise. pt s/p multiple fall. Baseline independent with ADLS PMH: DM, lung CA, CAD, PE, Lung partial removal, Knee surg, Bell's Pals. 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 Premature spillage of nectar thick liquids enters the airway prior to swallow initiation without sensation, and pt is unable to clear aspirates despite cues for coughing. Penetrates remain at the vocal folds for several minutes during the study before they are effectively moved. SLP intervention with postural maneuvers and compensatory strategies did not increase airway protection, although manipulation  of bolus size did. Base of tongue and pharyngeal weakness leads to mild, diffuse residuals with all consistencies tested, which is reduced with a cued second swallow. Recommend to continue Dys 3 textures and nectar thick liquids by spoon to reduce risk of aspiration.    Treatment Recommendation  Therapy as outlined in treatment plan below    Diet Recommendation Dysphagia 3 (Mechanical Soft);Nectar-thick liquid   Liquid Administration via: Spoon Medication Administration: Whole meds with puree Supervision: Patient able to self feed;Full supervision/cueing for compensatory strategies Compensations: Slow rate;Small sips/bites;Check for pocketing Postural Changes and/or Swallow Maneuvers: Seated upright 90 degrees    Other  Recommendations Oral Care Recommendations: Oral care BID Other Recommendations: Order thickener from pharmacy;Prohibited food (jello, ice cream, thin soups);Remove water pitcher   Follow Up Recommendations  Inpatient Rehab    Frequency and Duration min 2x/week  2 weeks   Pertinent Vitals/Pain n/a    SLP Swallow Goals     General HPI: 80 yo male admitted from home due to slurred speech, dizziness/ light headed, mild headache with R forehead bruise. pt s/p multiple fall. Baseline independent with ADLS PMH: DM, lung CA, CAD, PE, Lung partial removal, Knee surg, Bell's Pals. 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    Type of Study: Fiberoptic Endoscopic Evaluation of Swallowing Reason for Referral: Objectively evaluate swallowing function Previous Swallow Assessment: BSE 11/19/14 recommending Dys 3 and nectar thick liquids with FEES for more objective evaluation Diet Prior to this Study: Dysphagia 3 (soft);Nectar-thick liquids;Other (Comment) (no straws) Temperature Spikes Noted: No Respiratory Status: Room air  History of Recent Intubation: No Behavior/Cognition: Alert;Cooperative;Pleasant mood Oral Cavity - Dentition: Adequate natural  dentition Oral Motor / Sensory Function: Impaired - see Bedside swallow eval Patient Positioning: Upright in chair Baseline Vocal Quality: Clear Volitional Cough: Weak Volitional Swallow: Able to elicit Anatomy: Within functional limits Pharyngeal Secretions: Normal    Reason for Referral Objectively evaluate swallowing function   Oral Phase Oral Preparation/Oral Phase Oral Phase: Impaired Oral - Honey Oral - Honey Teaspoon: Within functional limits Oral - Nectar Oral - Nectar Teaspoon: Within functional limits Oral - Nectar Cup: Within functional limits Oral - Solids Oral - Puree: Reduced posterior propulsion;Delayed oral transit Oral - Mechanical Soft: Reduced posterior propulsion;Delayed oral transit;Other (Comment) (slow mastication)   Pharyngeal Phase Pharyngeal Phase Pharyngeal Phase: Impaired Pharyngeal - Honey Pharyngeal - Honey Teaspoon: Premature spillage to valleculae;Reduced pharyngeal peristalsis;Reduced tongue base retraction;Pharyngeal residue - valleculae;Pharyngeal residue - posterior pharnyx;Pharyngeal residue - pyriform sinuses Pharyngeal - Nectar Pharyngeal - Nectar Teaspoon: Premature spillage to valleculae;Reduced pharyngeal peristalsis;Reduced tongue base retraction;Pharyngeal residue - valleculae;Pharyngeal residue - posterior pharnyx;Pharyngeal residue - pyriform sinuses Pharyngeal - Nectar Cup: Premature spillage to valleculae;Reduced pharyngeal peristalsis;Reduced tongue base retraction;Pharyngeal residue - valleculae;Pharyngeal residue - posterior pharnyx;Pharyngeal residue - pyriform sinuses;Penetration/Aspiration before swallow Penetration/Aspiration details (nectar cup): Material enters airway, passes BELOW cords without attempt by patient to eject out (silent aspiration) Pharyngeal - Solids Pharyngeal - Puree: Premature spillage to valleculae;Reduced pharyngeal peristalsis;Reduced tongue base retraction;Pharyngeal residue - valleculae;Pharyngeal residue -  posterior pharnyx;Pharyngeal residue - pyriform sinuses Pharyngeal - Mechanical Soft: Premature spillage to valleculae;Reduced pharyngeal peristalsis;Reduced tongue base retraction;Pharyngeal residue - valleculae;Pharyngeal residue - posterior pharnyx;Pharyngeal residue - pyriform sinuses  Cervical Esophageal Phase    GO             Germain Osgood, M.A. CCC-SLP [phone]  Germain Osgood 11/20/2014, 3:24 PM

## 2014-11-20 NOTE — Plan of Care (Signed)
Pt transferred to Rehab from [facility] with family. Pt alerted and orientated x 4. Orientated to rehab schedule, expectations, etc. Specific diagnostic information provided. Pt and family looking forward to starting therapy in the a.m.

## 2014-11-20 NOTE — Progress Notes (Signed)
[name], MD Physician Signed Physical Medicine and Rehabilitation H&P 11/20/2014 2:45 PM  Related encounter: ED to Hosp-Admission (Current) from 11/18/2014 in [place] Collapse All      Physical Medicine and Rehabilitation Admission H&P   Chief Complaint  Patient presents with  . Gait instability, slurred speech and difficulty swallowing.    HPI: Chad Fuller is an 80 year old male with history of DM type 2 with neuropathy, PE, NSCLC s/p resection, CAD who was admitted on 11/18/14 with slurred speech, dizziness and difficulty walking with fall. CT head without acute changes, generalized atrophy and right forehead contusion. CT cervical spine with stenosis C3/4 to C 5/6. MRI/MRA brain with Small acute brainstem infarct in the left paracentral pons and no significant stenosis. 2D echo with EF 60%, AV sclerosis and mitral annular calcification without stenosis. Carotid dopplers without significant stenosis. Neurology recommended ASA for thrombotic stroke due to SVD. Speech therapy evaluation with dysphagia and FEES done today and patient to continue on Dysphagia 3, nectar liquids by spoon. Patient with resultant ataxia, right sided weakness with RLE instability, dysphagia as well as apraxia. Therapy ongoing and CIR recommended for follow up therapy. Pt was screened by the rehab team who felt he could benefit from our inpatient program.  Review of Systems  HENT: Positive for hearing loss.  Eyes: Negative for blurred vision and double vision.  Respiratory: Negative for cough and shortness of breath.  Cardiovascular: Positive for leg swelling (chronic issues with left ankle edema ). Negative for chest pain and palpitations.  Gastrointestinal: Positive for heartburn (chronic). Negative for nausea, vomiting and abdominal pain.  Genitourinary: Negative for urgency and frequency.  Musculoskeletal: Positive for myalgias, back  pain and joint pain.  Neurological: Positive for sensory change (numbnesss from knees down), speech change and focal weakness. Negative for dizziness and headaches.  Psychiatric/Behavioral: The patient is nervous/anxious and has insomnia.      Past Medical History  Diagnosis Date  . Diabetes mellitus   . Cancer     lung  . Prostate enlargement   . History of blood clots   . CAD (coronary artery disease)   . Pulmonary embolus july 2005    Past Surgical History  Procedure Laterality Date  . Lung removal, partial    . Coronary stent placement    . Knee surgery    . Filtering procedure      reports filter placed after knee surgery for blood clots  . I&d extremity  08/16/2012    Procedure: MINOR IRRIGATION AND DEBRIDEMENT EXTREMITY; Surgeon: Tennis Must, MD; Location: [facility]; Service: Orthopedics; Laterality: Right;    History reviewed. No pertinent family history.    Social History: Widowed. Lives alone. Retired Administrator. Has a large supportive family. He reports that he quit smoking about 40 years ago. His smoking use included Cigarettes. He does not have any smokeless tobacco history on file. He reports that he does not drink alcohol or use illicit drugs.    Allergies: No Known Allergies    Medications Prior to Admission  Medication Dose Route Frequency Provider Last Rate Last Dose  . [DISCONTINUED] triamcinolone acetonide (KENALOG) 10 MG/ML injection 10 mg 10 mg Intra-articular Once Bronson Ing, DPM     Medications Prior to Admission  Medication Sig Dispense Refill  . GLUCOSAMINE CHONDROITIN COMPLX PO Take 2 tablets by mouth daily.     . insulin glargine (LANTUS)  100 UNIT/ML injection Inject 70 Units into the skin daily.    . metFORMIN (GLUCOPHAGE-XR) 500 MG 24 hr tablet Take 500 mg by mouth daily with breakfast.    . Multiple  Vitamins-Minerals (MULTIVITAMINS THER. W/MINERALS) TABS Take 1 tablet by mouth daily.     . silver sulfADIAZINE (SILVADENE) 1 % cream Apply 1 application topically daily. 50 g 0  . ACCU-CHEK AVIVA PLUS test strip     . ezetimibe (ZETIA) 10 MG tablet Take 10 mg by mouth daily.    . finasteride (PROSCAR) 5 MG tablet Take 5 mg by mouth daily.     . tamsulosin (FLOMAX) 0.4 MG CAPS capsule       Home: Home Living Family/patient expects to be discharged to:: Private residence Living Arrangements: Alone Available Help at Discharge: Family Type of Home: House Home Access: Stairs to enter Technical brewer of Steps: 4 Entrance Stairs-Rails: Right Home Layout: One level Home Equipment: [place] - single point, Environmental consultant - 4 wheels  Functional History: Prior Function Level of Independence: Independent Comments: Pt reports doing some cooking, driving and independent for ADLs. Reports, "throwing himself up the steps."  Functional Status:  Mobility: Bed Mobility Overal bed mobility: Needs Assistance Bed Mobility: Supine to Sit Supine to sit: Supervision General bed mobility comments: Received in bathroom with tech finishing a shower. Transfers Overall transfer level: Needs assistance Equipment used: None Transfers: Sit to/from Stand Sit to Stand: Min guard General transfer comment: Min guard for steadying/balance upon standing due to impulsiveness. Multiple stand from recliner. No R knee buckling today. Able to stand upright without R lateral lean. Ambulation/Gait Ambulation/Gait assistance: Min assist Ambulation Distance (Feet): 50 Feet (x3 bouts) Assistive device: 1 person hand held assist, None (rail.) Gait Pattern/deviations: Step-through pattern, Decreased stride length, Decreased dorsiflexion - right, Decreased weight shift to left Gait velocity: decreased Gait velocity interpretation: Below normal speed for age/gender General Gait Details:  Required Min A (manual facilitation of hip flexors) to advance RLE during swing phase due to decreased DF resulting in dragging of RLE- most notable when fatigued. Use of rail for support. Multiple seated rest breaks. 1 LOB without rail due to impulsive behavior and forward momentum requring Min A to prevent fall.    ADL: ADL Overall ADL's : Needs assistance/impaired Eating/Feeding: Supervision/ safety, Sitting Eating/Feeding Details (indicate cue type and reason): Using R hand, pt able to drink thickened cranberry juice with supervision. No choking observed Grooming: Oral care, Moderate assistance, Cueing for safety, Sitting Toilet Transfer: Minimal assistance, Cueing for safety, Ambulation, Regular Toilet, RW Toilet Transfer Details (indicate cue type and reason): Min (A) for safe descend. Verbal cues for hand placement on RW. Toileting- Water quality scientist and Hygiene: Min guard, Sitting/lateral lean Toileting - Clothing Manipulation Details (indicate cue type and reason): Pt demonstrating good L lateral lean. Functional mobility during ADLs: Moderate assistance, Cueing for safety, Rolling walker General ADL Comments: Impulsive. Mod (A) for ambulation due to R foot dragging, R knee buckling, and R lateral lean. Pt demonstrating significant R lateral lean during static and dynamic balance activities. Had to sit pt down to complete oral care for safety. Pt actively using and manipulating objects with R hand without verbal or tactile cues.   Cognition: Cognition Overall Cognitive Status: Within Functional Limits for tasks assessed Orientation Level: Oriented to person, Oriented to time, Disoriented to place, Disoriented to situation Cognition Arousal/Alertness: Awake/alert Behavior During Therapy: Impulsive Overall Cognitive Status: Within Functional Limits for tasks assessed  Physical Exam: Blood pressure 120/72, pulse  81, temperature 97.5 F (36.4 C), temperature source Oral, resp.  rate 20, height 6' 2"  (1.88 m), weight 99.791 kg (220 lb), SpO2 97 %. Physical Exam Vitals reviewed. Gen: pleasant and no distress. A little fatigued HENT: tongue and lips blue from recent swallowing eval Head: Normocephalic.  Eyes: EOM are normal.  Neck: Normal range of motion. Neck supple. No thyromegaly present.  Cardiovascular: Normal rate and regular rhythm. no murmurs or rubs.  Respiratory: Effort normal and breath sounds normal. No respiratory distress.  GI: Soft. Bowel sounds are normal. He exhibits no distension.  Neurological: He is alert.  Speech is dysarthric but more intelligible. He is oriented to person place and age. He did follow simple commands. More aware of surroundings and situation. Asked pertinent questions regarding his stroke and rehab stay. Right central 7 and tongue deviation persist. No other obvious cn abnl. RUE: 2+ to 3-/5 deltoid, bicep, tricep, HI. RLE: 2+ hf, 3/5 ke, adf/apf. Still displays some apraxia. Had delays in initiating movement.  Skin: Skin is dry. Bilateral feet cool to touch. Left great toe with early blister and abraded area under toe nail  Psychiatric: He has a flat affect. cooperative     Results for orders placed or performed during the hospital encounter of 11/18/14 (from the past 48 hour(s))  CBC with Differential Status: None   Collection Time: 11/19/14 9:11 AM  Result Value Ref Range   WBC 6.4 4.0 - 10.5 K/uL   RBC 4.36 4.22 - 5.81 MIL/uL   Hemoglobin 13.9 13.0 - 17.0 g/dL   HCT 40.7 39.0 - 52.0 %   MCV 93.3 78.0 - 100.0 fL   MCH 31.9 26.0 - 34.0 pg   MCHC 34.2 30.0 - 36.0 g/dL   RDW 12.5 11.5 - 15.5 %   Platelets 225 150 - 400 K/uL   Neutrophils Relative % 61 43 - 77 %   Neutro Abs 3.9 1.7 - 7.7 K/uL   Lymphocytes Relative 28 12 - 46 %   Lymphs Abs 1.8 0.7 - 4.0 K/uL   Monocytes Relative 9 3 - 12 %   Monocytes Absolute 0.6 0.1 - 1.0 K/uL    Eosinophils Relative 2 0 - 5 %   Eosinophils Absolute 0.1 0.0 - 0.7 K/uL   Basophils Relative 0 0 - 1 %   Basophils Absolute 0.0 0.0 - 0.1 K/uL  Comprehensive metabolic panel Status: Abnormal   Collection Time: 11/19/14 9:11 AM  Result Value Ref Range   Sodium 141 137 - 147 mEq/L   Potassium 4.0 3.7 - 5.3 mEq/L   Chloride 101 96 - 112 mEq/L   CO2 29 19 - 32 mEq/L   Glucose, Bld 166 (H) 70 - 99 mg/dL   BUN 14 6 - 23 mg/dL   Creatinine, Ser 0.72 0.50 - 1.35 mg/dL   Calcium 8.9 8.4 - 10.5 mg/dL   Total Protein 6.6 6.0 - 8.3 g/dL   Albumin 3.6 3.5 - 5.2 g/dL   AST 18 0 - 37 U/L   ALT 15 0 - 53 U/L   Alkaline Phosphatase 98 39 - 117 U/L   Total Bilirubin 0.7 0.3 - 1.2 mg/dL   GFR calc non Af Amer 82 (L) >90 mL/min   GFR calc Af Amer >90 >90 mL/min    Comment: (NOTE) The eGFR has been calculated using the CKD EPI equation. This calculation has not been validated in all clinical situations. eGFR's persistently <90 mL/min signify possible Chronic Kidney Disease.    Anion  gap 11 5 - 15  Protime-INR Status: None   Collection Time: 11/19/14 9:11 AM  Result Value Ref Range   Prothrombin Time 14.6 11.6 - 15.2 seconds   INR 1.13 0.00 - 1.49  Glucose, capillary Status: Abnormal   Collection Time: 11/19/14 11:48 AM  Result Value Ref Range   Glucose-Capillary 274 (H) 70 - 99 mg/dL  Glucose, capillary Status: Abnormal   Collection Time: 11/19/14 4:28 PM  Result Value Ref Range   Glucose-Capillary 239 (H) 70 - 99 mg/dL   Comment 1 Notify RN    Comment 2 Documented in Chart   Glucose, capillary Status: Abnormal   Collection Time: 11/19/14 9:40 PM  Result Value Ref Range   Glucose-Capillary 232 (H) 70 - 99 mg/dL  Glucose, capillary Status: Abnormal   Collection Time: 11/20/14 6:35 AM  Result Value Ref Range     Glucose-Capillary 148 (H) 70 - 99 mg/dL   Comment 1 Documented in Chart    Comment 2 Notify RN   Glucose, capillary Status: Abnormal   Collection Time: 11/20/14 11:42 AM  Result Value Ref Range   Glucose-Capillary 197 (H) 70 - 99 mg/dL   Comment 1 Notify RN    Comment 2 Documented in Chart    Dg Chest 2 View  11/18/2014 CLINICAL DATA: Dizzy, fall. Slurred speech EXAM: CHEST 2 VIEW COMPARISON: Radiograph 02/21/2014 FINDINGS: Normal cardiac silhouette. Right-sided pulmonary scarring again demonstrated and not changed from CT 08/05/2012. Lungs are hyperinflated. Degenerate spurring of the spine. IMPRESSION: 1. No acute cardiopulmonary findings. 2. Chronic scarring and nodularity in the right upper lobe. Electronically Signed By: Suzy Bouchard M.D. On: 11/18/2014 21:54   Ct Head Wo Contrast  11/18/2014 CLINICAL DATA: Dizziness with fall. Mild headache and right forehead bruising. Initial encounter EXAM: CT HEAD WITHOUT CONTRAST CT CERVICAL SPINE WITHOUT CONTRAST TECHNIQUE: Multidetector CT imaging of the head and cervical spine was performed following the standard protocol without intravenous contrast. Multiplanar CT image reconstructions of the cervical spine were also generated. COMPARISON: None. FINDINGS: CT HEAD FINDINGS Skull and Sinuses:Right forehead contusion. No underlying fracture. The mastoids and paranasal sinuses are essentially clear. Orbits: No traumatic findings. Bilateral cataract resection. Brain: No evidence of acute abnormality, such as acute infarction, hemorrhage, hydrocephalus, or mass lesion/mass effect. There is generalized brain atrophy. Tortuous vertebrobasilar system, usually indicating chronic hypertension. CT CERVICAL SPINE FINDINGS No acute fracture or malalignment. No gross cervical canal hematoma or prevertebral edema. Advanced degenerative disc disease throughout the cervical spine. Short pedicles,  posterior disc osteophyte complexes, and posterior ligamentous thickening cause spinal canal stenosis from C3-4 to C5-6. IMPRESSION: 1. No acute intracranial or cervical spine finding. 2. Generalized brain atrophy. 3. Degenerative disc disease with mid cervical canal stenosis. Electronically Signed By: Jorje Guild M.D. On: 11/18/2014 23:54   Mri Brain Without Contrast   11/19/2014 CLINICAL DATA: 80 year old male with slurred speech and abnormal gait. Recent dizziness and fall with forehead injury. Initial encounter. EXAM: MRI HEAD WITHOUT CONTRAST MRA HEAD WITHOUT CONTRAST TECHNIQUE: Multiplanar, multiecho pulse sequences of the brain and surrounding structures were obtained without intravenous contrast. Angiographic images of the head were obtained using MRA technique without contrast. COMPARISON: Head and cervical spine CT 11/18/2014. FINDINGS: MRI HEAD FINDINGS Confluent 15 mm area of restricted diffusion in the left paracentral pons. Associated T2 and FLAIR hyperintensity. No mass effect or hemorrhage. Major intracranial vascular flow voids are preserved, there is generalized dolichoectasia of the vertebrobasilar system, resulting in some mass effect on the medulla (series 7, image  6). No other restricted diffusion. No midline shift, mass effect, evidence of mass lesion, ventriculomegaly, extra-axial collection or acute intracranial hemorrhage. Cervicomedullary junction and pituitary are within normal limits. Multilevel degenerative changes in the cervical spine with degenerative spinal stenosis. Normal for age supratentorial gray and white matter signal. Normal bone marrow signal. Postoperative changes to the globes. Visualized paranasal sinuses and mastoids are clear. Visible internal auditory structures appear normal. Visualized scalp soft tissues are within normal limits. MRA HEAD FINDINGS Antegrade flow in the posterior circulation. Dominant distal right vertebral artery.  Bilateral distal vertebral artery and vertebrobasilar junction tortuosity. Dolichoectatic proximal basilar artery. Mild mass effect on the lower brainstem due to tortuous vessels. No stenosis. Normal SCA and PCA origins. Tortuous proximal PCAs. Normal right posterior communicating artery, the left is diminutive or absent. Bilateral PCA branches are within normal limits. Antegrade flow in both ICA siphons. Normal ophthalmic and right posterior communicating artery origins. No siphon stenosis. Patent carotid termini. Normal MCA and ACA origins. Tortuous proximal ACA is. Anterior communicating artery within normal limits. Azygos type proximal ACA anatomy. Visualized bilateral ACA branches are tortuous but within normal limits. Tortuous proximal MCAs. Visualized bilateral MCA branches are within normal limits, some tortuous. IMPRESSION: 1. Small acute brainstem infarct in the left paracentral pons (pontine perforated her artery territory). No mass effect or hemorrhage. 2. Intracranial artery dolichoectasia, otherwise negative intracranial MRA. 3. Otherwise normal for age non contrast brain MRI. Electronically Signed By: Lars Pinks M.D. On: 11/19/2014 09:33       Medical Problem List and Plan: 1. Functional deficits secondary to Left paracentral pontine stroke 2. DVT Prophylaxis/Anticoagulation: Pharmaceutical: Lovenox 3. Pain Management: Tylenol prn for back pain. Will add K pad additionally.  4. Mood: LCSW to follow for evaluation and support.  5. Neuropsych: This patient is capable of making decisions on his own behalf. 6. Skin/Wound Care: Routine pressure relief measures. Rehab RN to monitor skin daily 7. Fluids/Electrolytes/Nutrition: Monitor I/O. Offer nutritional supplement prn if intake poor.  8. DM type 2 with neuropathy: Will monitor BS with ac/hs checks. Continue lantus and resume metformin as blood sugars poorly controlled.  9. BPH: Continue proscar. Monitor for any voiding  difficulty.  10. HTN: Will monitor blood pressure tid. Currently controlled off HCTZ.  11. GERD: will add Pepcid for symptoms.    Post Admission Physician Evaluation: 1. Functional deficits secondary to Left paracentral pontine infarct.  2. Patient is admitted to receive collaborative, interdisciplinary care between the physiatrist, rehab nursing staff, and therapy team. 3. Patient's level of medical complexity and substantial therapy needs in context of that medical necessity cannot be provided at a lesser intensity of care such as a SNF. 4. Patient has experienced substantial functional loss from his/her baseline which was documented above under the "Functional History" and "Functional Status" headings. Judging by the patient's diagnosis, physical exam, and functional history, the patient has potential for functional progress which will result in measurable gains while on inpatient rehab. These gains will be of substantial and practical use upon discharge in facilitating mobility and self-care at the household level. 5. Physiatrist will provide 24 hour management of medical needs as well as oversight of the therapy plan/treatment and provide guidance as appropriate regarding the interaction of the two. 6. 24 hour rehab nursing will assist with bladder management, bowel management, safety, skin/wound care, disease management, medication administration and patient education and help integrate therapy concepts, techniques,education, etc. 7. PT will assess and treat for/with: Lower extremity strength, range of motion, stamina,  balance, functional mobility, safety, adaptive techniques and equipment, NMR, visual perceptual awareness, initiation, stroke education, caregiver training. Goals are: mod I to supervision. 8. OT will assess and treat for/with: ADL's, functional mobility, safety, upper extremity strength, adaptive techniques and equipment, NMR, communication, ego support, community  reintegration. Goals are: mod I to supervision. Therapy may proceed with showering this patient. 9. SLP will assess and treat for/with: communication, swallowing. Goals are: mod I to supervision. 10. Case Management and Social Worker will assess and treat for psychological issues and discharge planning. 11. Team conference will be held weekly to assess progress toward goals and to determine barriers to discharge. 12. Patient will receive at least 3 hours of therapy per day at least 5 days per week. 13. ELOS: 9-14 days  14. Prognosis: excellent     [name], MD, [facility] 11/20/2014  11/20/2014

## 2014-11-20 NOTE — Clinical Social Work Note (Signed)
Clinical Social Worker received a call from [employer]) confirming patient will be admitted into CIR today.   Clinical Social Worker will sign off for now as social work intervention is no longer needed. Please consult [place] again if new need arises.  Glendon Axe, MSW, LCSWA [phone] 11/20/2014 4:07 PM

## 2014-11-20 NOTE — Progress Notes (Signed)
Inpatient Rehabilitation  I met with Chad Fuller and multiple family members at the bedside to discuss his post acute rehab options.  Family and patient strongly desire IP Rehab.  I have spoken with Dr. Sloan Leiter and he is agreeable for pt. To DC to rehab. RN Com and [employer] aware.  I have a bed and will arrange for admit today.  Please call if questions.  [facility] Admissions Coordinator Cell [phone] Office [phone]

## 2014-11-20 NOTE — Interval H&P Note (Signed)
Chad Fuller was admitted today to Inpatient Rehabilitation with the diagnosis of left pontine infarct.  The patient's history has been reviewed, patient examined, and there is no change in status.  Patient continues to be appropriate for intensive inpatient rehabilitation.  I have reviewed the patient's chart and labs.  Questions were answered to the patient's satisfaction.  Shahin Knierim T 11/20/2014, 8:29 PM

## 2014-11-20 NOTE — H&P (View-Only) (Signed)
Physical Medicine and Rehabilitation Admission H&P    Chief Complaint  Patient presents with  . Gait instability, slurred speech and difficulty swallowing.    HPI:  Chad Fuller is an 80 year old male with history of DM type 2 with neuropathy, PE, NSCLC s/p resection, CAD who was admitted on 11/18/14 with slurred speech, dizziness and difficulty walking with fall. CT head without acute changes, generalized atrophy and right forehead contusion. CT cervical spine with stenosis C3/4 to C 5/6. MRI/MRA brain with Small acute brainstem infarct in the left paracentral pons and no significant stenosis.  2D echo with EF 60%, AV sclerosis and mitral annular calcification without stenosis. Carotid dopplers without significant stenosis. Neurology recommended ASA for thrombotic stroke due to SVD. Speech therapy evaluation with dysphagia and FEES done today and patient to continue on Dysphagia 3, nectar liquids by spoon.  Patient with resultant ataxia, right sided weakness with RLE instability, dysphagia as well as apraxia. Therapy ongoing and CIR recommended for follow up therapy.  Pt was screened by the rehab team who felt he could benefit from our inpatient program.  Review of Systems  HENT: Positive for hearing loss.   Eyes: Negative for blurred vision and double vision.  Respiratory: Negative for cough and shortness of breath.   Cardiovascular: Positive for leg swelling (chronic issues with left ankle edema ). Negative for chest pain and palpitations.  Gastrointestinal: Positive for heartburn (chronic). Negative for nausea, vomiting and abdominal pain.  Genitourinary: Negative for urgency and frequency.  Musculoskeletal: Positive for myalgias, back pain and joint pain.  Neurological: Positive for sensory change (numbnesss from knees down), speech change and focal weakness. Negative for dizziness and headaches.  Psychiatric/Behavioral: The patient is nervous/anxious and has insomnia.       Past  Medical History  Diagnosis Date  . Diabetes mellitus   . Cancer     lung  . Prostate enlargement   . History of blood clots   . CAD (coronary artery disease)   . Pulmonary embolus july 2005    Past Surgical History  Procedure Laterality Date  . Lung removal, partial    . Coronary stent placement    . Knee surgery    . Filtering procedure      reports filter placed after knee surgery for blood clots  . I&d extremity  08/16/2012    Procedure: MINOR IRRIGATION AND DEBRIDEMENT EXTREMITY;  Surgeon: Tennis Must, MD;  Location: [facility];  Service: Orthopedics;  Laterality: Right;    History reviewed. No pertinent family history.    Social History:  Widowed. Lives alone. Retired Administrator. Has a large supportive family. He  reports that he quit smoking about 40 years ago. His smoking use included Cigarettes. He does not have any smokeless tobacco history on file. He reports that he does not drink alcohol or use illicit drugs.    Allergies: No Known Allergies    Medications Prior to Admission  Medication Dose Route Frequency Provider Last Rate Last Dose  . [DISCONTINUED] triamcinolone acetonide (KENALOG) 10 MG/ML injection 10 mg  10 mg Intra-articular Once Bronson Ing, DPM       Medications Prior to Admission  Medication Sig Dispense Refill  . GLUCOSAMINE CHONDROITIN COMPLX PO Take 2 tablets by mouth daily.      . insulin glargine (LANTUS) 100 UNIT/ML injection Inject 70 Units into the skin daily.    . metFORMIN (GLUCOPHAGE-XR) 500 MG 24 hr tablet Take 500  mg by mouth daily with breakfast.    . Multiple Vitamins-Minerals (MULTIVITAMINS THER. W/MINERALS) TABS Take 1 tablet by mouth daily.      . silver sulfADIAZINE (SILVADENE) 1 % cream Apply 1 application topically daily. 50 g 0  . ACCU-CHEK AVIVA PLUS test strip     . ezetimibe (ZETIA) 10 MG tablet Take 10 mg by mouth daily.    . finasteride (PROSCAR) 5 MG tablet Take 5 mg by mouth daily.      .  tamsulosin (FLOMAX) 0.4 MG CAPS capsule       Home: Home Living Family/patient expects to be discharged to:: Private residence Living Arrangements: Alone Available Help at Discharge: Family Type of Home: House Home Access: Stairs to enter Technical brewer of Steps: 4 Entrance Stairs-Rails: Right Home Layout: One level Home Equipment: [place] - single point, Environmental consultant - 4 wheels   Functional History: Prior Function Level of Independence: Independent Comments: Pt reports doing some cooking, driving and independent for ADLs. Reports, "throwing himself up the steps."  Functional Status:  Mobility: Bed Mobility Overal bed mobility: Needs Assistance Bed Mobility: Supine to Sit Supine to sit: Supervision General bed mobility comments: Received in bathroom with tech finishing a shower. Transfers Overall transfer level: Needs assistance Equipment used: None Transfers: Sit to/from Stand Sit to Stand: Min guard General transfer comment: Min guard for steadying/balance upon standing due to impulsiveness. Multiple stand from recliner. No R knee buckling today. Able to stand upright without R lateral lean. Ambulation/Gait Ambulation/Gait assistance: Min assist Ambulation Distance (Feet): 50 Feet (x3 bouts) Assistive device: 1 person hand held assist, None (rail.) Gait Pattern/deviations: Step-through pattern, Decreased stride length, Decreased dorsiflexion - right, Decreased weight shift to left Gait velocity: decreased Gait velocity interpretation: Below normal speed for age/gender General Gait Details: Required Min A (manual facilitation of hip flexors) to advance RLE during swing phase due to decreased DF resulting in dragging of RLE- most notable when fatigued. Use of rail for support. Multiple seated rest breaks. 1 LOB without rail due to impulsive behavior and forward momentum requring Min A to prevent fall.    ADL: ADL Overall ADL's : Needs assistance/impaired Eating/Feeding:  Supervision/ safety, Sitting Eating/Feeding Details (indicate cue type and reason): Using R hand, pt able to drink thickened cranberry juice with supervision. No choking observed Grooming: Oral care, Moderate assistance, Cueing for safety, Sitting Toilet Transfer: Minimal assistance, Cueing for safety, Ambulation, Regular Toilet, RW Toilet Transfer Details (indicate cue type and reason): Min (A) for safe descend. Verbal cues for hand placement on RW. Toileting- Water quality scientist and Hygiene: Min guard, Sitting/lateral lean Toileting - Clothing Manipulation Details (indicate cue type and reason): Pt demonstrating good L lateral lean. Functional mobility during ADLs: Moderate assistance, Cueing for safety, Rolling walker General ADL Comments: Impulsive. Mod (A) for ambulation due to R foot dragging, R knee buckling, and R lateral lean. Pt demonstrating significant R lateral  lean during static and dynamic balance activities. Had to sit pt down to complete oral care for safety. Pt actively using and manipulating objects with R hand without verbal or tactile cues.   Cognition: Cognition Overall Cognitive Status: Within Functional Limits for tasks assessed Orientation Level: Oriented to person, Oriented to time, Disoriented to place, Disoriented to situation Cognition Arousal/Alertness: Awake/alert Behavior During Therapy: Impulsive Overall Cognitive Status: Within Functional Limits for tasks assessed  Physical Exam: Blood pressure 120/72, pulse 81, temperature 97.5 F (36.4 C), temperature source Oral, resp. rate 20, height 6' 2"  (1.88 m), weight 99.791  kg (220 lb), SpO2 97 %. Physical Exam Vitals reviewed. Gen: pleasant and no distress. A little fatigued HENT: tongue and lips blue from recent swallowing eval Head: Normocephalic.  Eyes: EOM are normal.  Neck: Normal range of motion. Neck supple. No thyromegaly present.  Cardiovascular: Normal rate and regular rhythm. no murmurs or  rubs.  Respiratory: Effort normal and breath sounds normal. No respiratory distress.  GI: Soft. Bowel sounds are normal. He exhibits no distension.  Neurological: He is alert.  Speech is   dysarthric but more intelligible. He is oriented to person place and age. He did follow simple commands. More aware of surroundings and situation. Asked pertinent questions regarding his stroke and rehab stay. Right central 7 and tongue deviation persist. No other obvious cn abnl. RUE: 2+ to 3-/5 deltoid, bicep, tricep, HI. RLE: 2+ hf,  3/5 ke, adf/apf. Still displays some apraxia. Had delays in initiating movement.  Skin: Skin is dry. Bilateral feet cool to touch. Left great toe with early blister and abraded area under toe nail  Psychiatric: He has a flat affect. cooperative     Results for orders placed or performed during the hospital encounter of 11/18/14 (from the past 48 hour(s))  CBC with Differential     Status: None   Collection Time: 11/19/14  9:11 AM  Result Value Ref Range   WBC 6.4 4.0 - 10.5 K/uL   RBC 4.36 4.22 - 5.81 MIL/uL   Hemoglobin 13.9 13.0 - 17.0 g/dL   HCT 40.7 39.0 - 52.0 %   MCV 93.3 78.0 - 100.0 fL   MCH 31.9 26.0 - 34.0 pg   MCHC 34.2 30.0 - 36.0 g/dL   RDW 12.5 11.5 - 15.5 %   Platelets 225 150 - 400 K/uL   Neutrophils Relative % 61 43 - 77 %   Neutro Abs 3.9 1.7 - 7.7 K/uL   Lymphocytes Relative 28 12 - 46 %   Lymphs Abs 1.8 0.7 - 4.0 K/uL   Monocytes Relative 9 3 - 12 %   Monocytes Absolute 0.6 0.1 - 1.0 K/uL   Eosinophils Relative 2 0 - 5 %   Eosinophils Absolute 0.1 0.0 - 0.7 K/uL   Basophils Relative 0 0 - 1 %   Basophils Absolute 0.0 0.0 - 0.1 K/uL  Comprehensive metabolic panel     Status: Abnormal   Collection Time: 11/19/14  9:11 AM  Result Value Ref Range   Sodium 141 137 - 147 mEq/L   Potassium 4.0 3.7 - 5.3 mEq/L   Chloride 101 96 - 112 mEq/L   CO2 29 19 - 32 mEq/L   Glucose, Bld 166 (H) 70 - 99 mg/dL   BUN 14 6 - 23 mg/dL   Creatinine, Ser 0.72  0.50 - 1.35 mg/dL   Calcium 8.9 8.4 - 10.5 mg/dL   Total Protein 6.6 6.0 - 8.3 g/dL   Albumin 3.6 3.5 - 5.2 g/dL   AST 18 0 - 37 U/L   ALT 15 0 - 53 U/L   Alkaline Phosphatase 98 39 - 117 U/L   Total Bilirubin 0.7 0.3 - 1.2 mg/dL   GFR calc non Af Amer 82 (L) >90 mL/min   GFR calc Af Amer >90 >90 mL/min    Comment: (NOTE) The eGFR has been calculated using the CKD EPI equation. This calculation has not been validated in all clinical situations. eGFR's persistently <90 mL/min signify possible Chronic Kidney Disease.    Anion gap 11 5 - 15  Protime-INR     Status: None   Collection Time: 11/19/14  9:11 AM  Result Value Ref Range   Prothrombin Time 14.6 11.6 - 15.2 seconds   INR 1.13 0.00 - 1.49  Glucose, capillary     Status: Abnormal   Collection Time: 11/19/14 11:48 AM  Result Value Ref Range   Glucose-Capillary 274 (H) 70 - 99 mg/dL  Glucose, capillary     Status: Abnormal   Collection Time: 11/19/14  4:28 PM  Result Value Ref Range   Glucose-Capillary 239 (H) 70 - 99 mg/dL   Comment 1 Notify RN    Comment 2 Documented in Chart   Glucose, capillary     Status: Abnormal   Collection Time: 11/19/14  9:40 PM  Result Value Ref Range   Glucose-Capillary 232 (H) 70 - 99 mg/dL  Glucose, capillary     Status: Abnormal   Collection Time: 11/20/14  6:35 AM  Result Value Ref Range   Glucose-Capillary 148 (H) 70 - 99 mg/dL   Comment 1 Documented in Chart    Comment 2 Notify RN   Glucose, capillary     Status: Abnormal   Collection Time: 11/20/14 11:42 AM  Result Value Ref Range   Glucose-Capillary 197 (H) 70 - 99 mg/dL   Comment 1 Notify RN    Comment 2 Documented in Chart    Dg Chest 2 View  11/18/2014   CLINICAL DATA:  Dizzy, fall.  Slurred speech  EXAM: CHEST  2 VIEW  COMPARISON:  Radiograph 02/21/2014  FINDINGS: Normal cardiac silhouette. Right-sided pulmonary scarring again demonstrated and not changed from CT 08/05/2012. Lungs are hyperinflated. Degenerate spurring of  the spine.  IMPRESSION: 1. No acute cardiopulmonary findings. 2. Chronic scarring and nodularity in the right upper lobe.   Electronically Signed   By: Suzy Bouchard M.D.   On: 11/18/2014 21:54   Ct Head Wo Contrast  11/18/2014   CLINICAL DATA:  Dizziness with fall. Mild headache and right forehead bruising. Initial encounter  EXAM: CT HEAD WITHOUT CONTRAST  CT CERVICAL SPINE WITHOUT CONTRAST  TECHNIQUE: Multidetector CT imaging of the head and cervical spine was performed following the standard protocol without intravenous contrast. Multiplanar CT image reconstructions of the cervical spine were also generated.  COMPARISON:  None.  FINDINGS: CT HEAD FINDINGS  Skull and Sinuses:Right forehead contusion.  No underlying fracture.  The mastoids and paranasal sinuses are essentially clear.  Orbits: No traumatic findings.  Bilateral cataract resection.  Brain: No evidence of acute abnormality, such as acute infarction, hemorrhage, hydrocephalus, or mass lesion/mass effect. There is generalized brain atrophy. Tortuous vertebrobasilar system, usually indicating chronic hypertension.  CT CERVICAL SPINE FINDINGS  No acute fracture or malalignment. No gross cervical canal hematoma or prevertebral edema.  Advanced degenerative disc disease throughout the cervical spine. Short pedicles, posterior disc osteophyte complexes, and posterior ligamentous thickening cause spinal canal stenosis from C3-4 to C5-6.  IMPRESSION: 1. No acute intracranial or cervical spine finding. 2. Generalized brain atrophy. 3. Degenerative disc disease with mid cervical canal stenosis.   Electronically Signed   By: Jorje Guild M.D.   On: 11/18/2014 23:54   Mri Brain Without Contrast   11/19/2014   CLINICAL DATA:  80 year old male with slurred speech and abnormal gait. Recent dizziness and fall with forehead injury. Initial encounter.  EXAM: MRI HEAD WITHOUT CONTRAST  MRA HEAD WITHOUT CONTRAST  TECHNIQUE: Multiplanar, multiecho pulse  sequences of the brain and surrounding structures were obtained without intravenous contrast. Angiographic  images of the head were obtained using MRA technique without contrast.  COMPARISON:  Head and cervical spine CT 11/18/2014.  FINDINGS: MRI HEAD FINDINGS  Confluent 15 mm area of restricted diffusion in the left paracentral pons. Associated T2 and FLAIR hyperintensity. No mass effect or hemorrhage. Major intracranial vascular flow voids are preserved, there is generalized dolichoectasia of the vertebrobasilar system, resulting in some mass effect on the medulla (series 7, image 6).  No other restricted diffusion. No midline shift, mass effect, evidence of mass lesion, ventriculomegaly, extra-axial collection or acute intracranial hemorrhage. Cervicomedullary junction and pituitary are within normal limits. Multilevel degenerative changes in the cervical spine with degenerative spinal stenosis. Normal for age supratentorial gray and white matter signal.  Normal bone marrow signal. Postoperative changes to the globes. Visualized paranasal sinuses and mastoids are clear. Visible internal auditory structures appear normal. Visualized scalp soft tissues are within normal limits.  MRA HEAD FINDINGS  Antegrade flow in the posterior circulation. Dominant distal right vertebral artery. Bilateral distal vertebral artery and vertebrobasilar junction tortuosity. Dolichoectatic proximal basilar artery. Mild mass effect on the lower brainstem due to tortuous vessels. No stenosis. Normal SCA and PCA origins. Tortuous proximal PCAs. Normal right posterior communicating artery, the left is diminutive or absent. Bilateral PCA branches are within normal limits.  Antegrade flow in both ICA siphons. Normal ophthalmic and right posterior communicating artery origins. No siphon stenosis. Patent carotid termini. Normal MCA and ACA origins. Tortuous proximal ACA is. Anterior communicating artery within normal limits. Azygos type  proximal ACA anatomy. Visualized bilateral ACA branches are tortuous but within normal limits. Tortuous proximal MCAs. Visualized bilateral MCA branches are within normal limits, some tortuous.  IMPRESSION: 1. Small acute brainstem infarct in the left paracentral pons (pontine perforated her artery territory). No mass effect or hemorrhage. 2. Intracranial artery dolichoectasia, otherwise negative intracranial MRA. 3. Otherwise normal for age non contrast brain MRI.   Electronically Signed   By: Lars Pinks M.D.   On: 11/19/2014 09:33       Medical Problem List and Plan: 1. Functional deficits secondary to Left paracentral pontine stroke 2.  DVT Prophylaxis/Anticoagulation: Pharmaceutical: Lovenox 3. Pain Management: Tylenol prn for back pain. Will add K pad additionally.  4. Mood: LCSW to follow for evaluation and support.  5. Neuropsych: This patient is capable of making decisions on his own behalf. 6. Skin/Wound Care: Routine pressure relief measures. Rehab RN to monitor skin daily 7. Fluids/Electrolytes/Nutrition: Monitor I/O. Offer nutritional supplement prn if intake poor.  8. DM type 2 with neuropathy: Will monitor BS with ac/hs checks. Continue lantus and resume metformin as blood sugars poorly controlled.  9.  BPH: Continue proscar. Monitor for any voiding difficulty.   10.  HTN: Will monitor blood pressure tid. Currently controlled off HCTZ.  11. GERD: will add Pepcid for symptoms.    Post Admission Physician Evaluation: 1. Functional deficits secondary  to Left paracentral pontine infarct.  2. Patient is admitted to receive collaborative, interdisciplinary care between the physiatrist, rehab nursing staff, and therapy team. 3. Patient's level of medical complexity and substantial therapy needs in context of that medical necessity cannot be provided at a lesser intensity of care such as a SNF. 4. Patient has experienced substantial functional loss from his/her baseline which was  documented above under the "Functional History" and "Functional Status" headings.  Judging by the patient's diagnosis, physical exam, and functional history, the patient has potential for functional progress which will result in measurable gains while on inpatient  rehab.  These gains will be of substantial and practical use upon discharge  in facilitating mobility and self-care at the household level. 5. Physiatrist will provide 24 hour management of medical needs as well as oversight of the therapy plan/treatment and provide guidance as appropriate regarding the interaction of the two. 6. 24 hour rehab nursing will assist with bladder management, bowel management, safety, skin/wound care, disease management, medication administration and patient education  and help integrate therapy concepts, techniques,education, etc. 7. PT will assess and treat for/with: Lower extremity strength, range of motion, stamina, balance, functional mobility, safety, adaptive techniques and equipment, NMR, visual perceptual awareness, initiation, stroke education, caregiver training.   Goals are: mod I to supervision. 8. OT will assess and treat for/with: ADL's, functional mobility, safety, upper extremity strength, adaptive techniques and equipment, NMR, communication, ego support, community reintegration.   Goals are: mod I to supervision. Therapy may proceed with showering this patient. 9. SLP will assess and treat for/with: communication, swallowing.  Goals are: mod I to supervision. 10. Case Management and Social Worker will assess and treat for psychological issues and discharge planning. 11. Team conference will be held weekly to assess progress toward goals and to determine barriers to discharge. 12. Patient will receive at least 3 hours of therapy per day at least 5 days per week. 13. ELOS: 9-14 days       14. Prognosis:  excellent     [name], MD, [facility] 11/20/2014   11/20/2014

## 2014-11-20 NOTE — Progress Notes (Signed)
STROKE TEAM PROGRESS NOTE   HISTORY Chad Fuller is an 80 y.o. male presenting for evaluation of slurred speech and gait instability. His son called him around 3pm yesterday 11/18/2014, noted that his speech was very slurred, difficult to understand, notes his dad seemed to have difficulty getting his words out. Patient notes around that time he also noticed increased difficulty walking, felt unsteady, had a fall. These symptoms have persisted.   He has been having dizziness (described as feeling off balance) and gait instability for the past few weeks. The dizziness started around being started on gabapentin for diabetic neuropathy. Stopped around 2 weeks ago and symptoms improved. Notes current symptoms are worse than these prior symptoms.   He has history of DM, PE (not on anticoagulation)  Patient was not administered TPA secondary to  outside the therapeutic window. He was admitted for further evaluation and treatment.   SUBJECTIVE (INTERVAL HISTORY) Daughter at bedside. Dr. Leonie Man discussed diagnosis, prognosis,  treatment options and plan of care with pt and daughter.     OBJECTIVE Temp:  [97.4 F (36.3 C)-98.6 F (37 C)] 97.4 F (36.3 C) (11/24 0940) Pulse Rate:  [75-89] 77 (11/24 0940) Cardiac Rhythm:  [-] Normal sinus rhythm (11/24 0800) Resp:  [18-22] 20 (11/24 0940) BP: (127-168)/(62-81) 143/71 mmHg (11/24 0940) SpO2:  [93 %-97 %] 95 % (11/24 0940)   Recent Labs Lab 11/19/14 0702 11/19/14 1148 11/19/14 1628 11/19/14 2140 11/20/14 0635  GLUCAP 212* 274* 239* 232* 148*    Recent Labs Lab 11/18/14 2045 11/19/14 0911  NA 138 141  K 4.0 4.0  CL 98 101  CO2 26 29  GLUCOSE 248* 166*  BUN 15 14  CREATININE 0.69 0.72  CALCIUM 9.6 8.9    Recent Labs Lab 11/18/14 2045 11/19/14 0911  AST 22 18  ALT 16 15  ALKPHOS 104 98  BILITOT 0.5 0.7  PROT 6.7 6.6  ALBUMIN 3.7 3.6    Recent Labs Lab 11/18/14 2045 11/19/14 0911  WBC 7.0 6.4  NEUTROABS 5.1 3.9   HGB 14.2 13.9  HCT 41.1 40.7  MCV 90.9 93.3  PLT 217 225    Recent Labs Lab 11/18/14 2045  TROPONINI <0.30    Recent Labs  11/19/14 0911  LABPROT 14.6  INR 1.13    Recent Labs  11/19/14 0440  COLORURINE YELLOW  LABSPEC 1.026  PHURINE 6.5  GLUCOSEU >1000*  HGBUR SMALL*  BILIRUBINUR NEGATIVE  KETONESUR 15*  PROTEINUR NEGATIVE  UROBILINOGEN 1.0  NITRITE NEGATIVE  LEUKOCYTESUR NEGATIVE       Component Value Date/Time   CHOL 120 11/19/2014 0710   TRIG 72 11/19/2014 0710   HDL 36* 11/19/2014 0710   CHOLHDL 3.3 11/19/2014 0710   VLDL 14 11/19/2014 0710   LDLCALC 70 11/19/2014 0710   Lab Results  Component Value Date   HGBA1C 7.9* 11/19/2014      Component Value Date/Time   LABOPIA NONE DETECTED 11/19/2014 0440   COCAINSCRNUR NONE DETECTED 11/19/2014 0440   LABBENZ NONE DETECTED 11/19/2014 0440   AMPHETMU NONE DETECTED 11/19/2014 0440   THCU NONE DETECTED 11/19/2014 0440   LABBARB NONE DETECTED 11/19/2014 0440    No results for input(s): ETH in the last 168 hours.  Dg Chest 2 View  11/18/2014   CLINICAL DATA:  Dizzy, fall.  Slurred speech  EXAM: CHEST  2 VIEW  COMPARISON:  Radiograph 02/21/2014  FINDINGS: Normal cardiac silhouette. Right-sided pulmonary scarring again demonstrated and not changed from CT 08/05/2012. Lungs are  hyperinflated. Degenerate spurring of the spine.  IMPRESSION: 1. No acute cardiopulmonary findings. 2. Chronic scarring and nodularity in the right upper lobe.   Electronically Signed   By: Suzy Bouchard M.D.   On: 11/18/2014 21:54   Ct Head Wo Contrast  11/18/2014   CLINICAL DATA:  Dizziness with fall. Mild headache and right forehead bruising. Initial encounter  EXAM: CT HEAD WITHOUT CONTRAST  CT CERVICAL SPINE WITHOUT CONTRAST  TECHNIQUE: Multidetector CT imaging of the head and cervical spine was performed following the standard protocol without intravenous contrast. Multiplanar CT image reconstructions of the cervical spine were  also generated.  COMPARISON:  None.  FINDINGS: CT HEAD FINDINGS  Skull and Sinuses:Right forehead contusion.  No underlying fracture.  The mastoids and paranasal sinuses are essentially clear.  Orbits: No traumatic findings.  Bilateral cataract resection.  Brain: No evidence of acute abnormality, such as acute infarction, hemorrhage, hydrocephalus, or mass lesion/mass effect. There is generalized brain atrophy. Tortuous vertebrobasilar system, usually indicating chronic hypertension.  CT CERVICAL SPINE FINDINGS  No acute fracture or malalignment. No gross cervical canal hematoma or prevertebral edema.  Advanced degenerative disc disease throughout the cervical spine. Short pedicles, posterior disc osteophyte complexes, and posterior ligamentous thickening cause spinal canal stenosis from C3-4 to C5-6.  IMPRESSION: 1. No acute intracranial or cervical spine finding. 2. Generalized brain atrophy. 3. Degenerative disc disease with mid cervical canal stenosis.   Electronically Signed   By: Jorje Guild M.D.   On: 11/18/2014 23:54   Ct Cervical Spine Wo Contrast  11/18/2014   CLINICAL DATA:  Dizziness with fall. Mild headache and right forehead bruising. Initial encounter  EXAM: CT HEAD WITHOUT CONTRAST  CT CERVICAL SPINE WITHOUT CONTRAST  TECHNIQUE: Multidetector CT imaging of the head and cervical spine was performed following the standard protocol without intravenous contrast. Multiplanar CT image reconstructions of the cervical spine were also generated.  COMPARISON:  None.  FINDINGS: CT HEAD FINDINGS  Skull and Sinuses:Right forehead contusion.  No underlying fracture.  The mastoids and paranasal sinuses are essentially clear.  Orbits: No traumatic findings.  Bilateral cataract resection.  Brain: No evidence of acute abnormality, such as acute infarction, hemorrhage, hydrocephalus, or mass lesion/mass effect. There is generalized brain atrophy. Tortuous vertebrobasilar system, usually indicating chronic  hypertension.  CT CERVICAL SPINE FINDINGS  No acute fracture or malalignment. No gross cervical canal hematoma or prevertebral edema.  Advanced degenerative disc disease throughout the cervical spine. Short pedicles, posterior disc osteophyte complexes, and posterior ligamentous thickening cause spinal canal stenosis from C3-4 to C5-6.  IMPRESSION: 1. No acute intracranial or cervical spine finding. 2. Generalized brain atrophy. 3. Degenerative disc disease with mid cervical canal stenosis.   Electronically Signed   By: Jorje Guild M.D.   On: 11/18/2014 23:54   Mri Brain Without Contrast  11/19/2014   CLINICAL DATA:  80 year old male with slurred speech and abnormal gait. Recent dizziness and fall with forehead injury. Initial encounter.  EXAM: MRI HEAD WITHOUT CONTRAST  MRA HEAD WITHOUT CONTRAST  TECHNIQUE: Multiplanar, multiecho pulse sequences of the brain and surrounding structures were obtained without intravenous contrast. Angiographic images of the head were obtained using MRA technique without contrast.  COMPARISON:  Head and cervical spine CT 11/18/2014.  FINDINGS: MRI HEAD FINDINGS  Confluent 15 mm area of restricted diffusion in the left paracentral pons. Associated T2 and FLAIR hyperintensity. No mass effect or hemorrhage. Major intracranial vascular flow voids are preserved, there is generalized dolichoectasia of the vertebrobasilar system,  resulting in some mass effect on the medulla (series 7, image 6).  No other restricted diffusion. No midline shift, mass effect, evidence of mass lesion, ventriculomegaly, extra-axial collection or acute intracranial hemorrhage. Cervicomedullary junction and pituitary are within normal limits. Multilevel degenerative changes in the cervical spine with degenerative spinal stenosis. Normal for age supratentorial gray and white matter signal.  Normal bone marrow signal. Postoperative changes to the globes. Visualized paranasal sinuses and mastoids are clear.  Visible internal auditory structures appear normal. Visualized scalp soft tissues are within normal limits.  MRA HEAD FINDINGS  Antegrade flow in the posterior circulation. Dominant distal right vertebral artery. Bilateral distal vertebral artery and vertebrobasilar junction tortuosity. Dolichoectatic proximal basilar artery. Mild mass effect on the lower brainstem due to tortuous vessels. No stenosis. Normal SCA and PCA origins. Tortuous proximal PCAs. Normal right posterior communicating artery, the left is diminutive or absent. Bilateral PCA branches are within normal limits.  Antegrade flow in both ICA siphons. Normal ophthalmic and right posterior communicating artery origins. No siphon stenosis. Patent carotid termini. Normal MCA and ACA origins. Tortuous proximal ACA is. Anterior communicating artery within normal limits. Azygos type proximal ACA anatomy. Visualized bilateral ACA branches are tortuous but within normal limits. Tortuous proximal MCAs. Visualized bilateral MCA branches are within normal limits, some tortuous.  IMPRESSION: 1. Small acute brainstem infarct in the left paracentral pons (pontine perforated her artery territory). No mass effect or hemorrhage. 2. Intracranial artery dolichoectasia, otherwise negative intracranial MRA. 3. Otherwise normal for age non contrast brain MRI.   Electronically Signed   By: Lars Pinks M.D.   On: 11/19/2014 09:33   Mr Jodene Nam Head/brain Wo Cm  11/19/2014   CLINICAL DATA:  80 year old male with slurred speech and abnormal gait. Recent dizziness and fall with forehead injury. Initial encounter.  EXAM: MRI HEAD WITHOUT CONTRAST  MRA HEAD WITHOUT CONTRAST  TECHNIQUE: Multiplanar, multiecho pulse sequences of the brain and surrounding structures were obtained without intravenous contrast. Angiographic images of the head were obtained using MRA technique without contrast.  COMPARISON:  Head and cervical spine CT 11/18/2014.  FINDINGS: MRI HEAD FINDINGS  Confluent 15  mm area of restricted diffusion in the left paracentral pons. Associated T2 and FLAIR hyperintensity. No mass effect or hemorrhage. Major intracranial vascular flow voids are preserved, there is generalized dolichoectasia of the vertebrobasilar system, resulting in some mass effect on the medulla (series 7, image 6).  No other restricted diffusion. No midline shift, mass effect, evidence of mass lesion, ventriculomegaly, extra-axial collection or acute intracranial hemorrhage. Cervicomedullary junction and pituitary are within normal limits. Multilevel degenerative changes in the cervical spine with degenerative spinal stenosis. Normal for age supratentorial gray and white matter signal.  Normal bone marrow signal. Postoperative changes to the globes. Visualized paranasal sinuses and mastoids are clear. Visible internal auditory structures appear normal. Visualized scalp soft tissues are within normal limits.  MRA HEAD FINDINGS  Antegrade flow in the posterior circulation. Dominant distal right vertebral artery. Bilateral distal vertebral artery and vertebrobasilar junction tortuosity. Dolichoectatic proximal basilar artery. Mild mass effect on the lower brainstem due to tortuous vessels. No stenosis. Normal SCA and PCA origins. Tortuous proximal PCAs. Normal right posterior communicating artery, the left is diminutive or absent. Bilateral PCA branches are within normal limits.  Antegrade flow in both ICA siphons. Normal ophthalmic and right posterior communicating artery origins. No siphon stenosis. Patent carotid termini. Normal MCA and ACA origins. Tortuous proximal ACA is. Anterior communicating artery within normal limits. Azygos type  proximal ACA anatomy. Visualized bilateral ACA branches are tortuous but within normal limits. Tortuous proximal MCAs. Visualized bilateral MCA branches are within normal limits, some tortuous.  IMPRESSION: 1. Small acute brainstem infarct in the left paracentral pons (pontine  perforated her artery territory). No mass effect or hemorrhage. 2. Intracranial artery dolichoectasia, otherwise negative intracranial MRA. 3. Otherwise normal for age non contrast brain MRI.   Electronically Signed   By: Lars Pinks M.D.   On: 11/19/2014 09:33   Carotid Doppler  There is 1-39% bilateral ICA stenosis. Vertebral artery flow is antegrade.    2D Echocardiogram  EF 60% with no source of embolus.    PHYSICAL EXAM Pleasant elderly caucasian male not in distress.Awake alert. Afebrile. Head is nontraumatic. Neck is supple without bruit. Hearing is normal. Cardiac exam no murmur or gallop. Lungs are clear to auscultation. Distal pulses are well felt. Neurological Exam : Awake alert oriented x 3 normal speech and language. Mild right lower face asymmetry. Tongue midline. No drift. Mild diminished fine finger movements on right. Orbits left over right upper extremity. Mild right grip weak.. Normal sensation . Normal coordination.Gait deferred.   ASSESSMENT/PLAN Mr. Chad Fuller is a 80 y.o. male with history of diabetes, PE , non-small cell lung cancer status post resection, coronary artery disease presenting with slurred speech and gait instability. He did not receive IV t-PA due to delay in arrival.   StrokeTIA:  left paracentral pontine infarct secondary to small vessel disease source  Resultant  Dysarthria & ataxia  MRI  L paracentral pontine infarct   MRA  Unremarkable   Carotid Doppler  No significant stenosis   2D Echo No source of embolus   Lovenox 40 mg sq daily for VTE prophylaxis  DIET DYS 3 thin liquids. Passed RN swallow screen. Given pontine location, will have ST reassess   no antithrombotics prior to admission, now on aspirin 325 mg orally every day  Ongoing aggressive risk factor management  Therapy recommendations:  CIR.  Consult agrees.  Disposition:  CIR NO FURTHER STROKE WORKUP INDICATED Patient has a 10-15% risk of having another stroke over the  next year, the highest risk is within 2 weeks of the most recent stroke/TIA (risk of having a stroke following a stroke or TIA is the same). Ongoing risk factor control by Primary Care Physician Stroke Service will sign off. Please call should any needs arise. Follow up with Dr. Antony Contras at [facility] in 1 month(s), order placed.  Hypertension  Stable  Hyperlipidemia  Home meds:  zetia resumed in hospital  LDL 70, goal < 70  Will not add statin at this time given LDL just at goal in this 80 year old guy  Continue zetia at discharge  Diabetes type 2  HgbA1c 7.9 goal < 7.0  Other Stroke Risk Factors  Advanced age  Former Cigarette smoker, stopped 40 years ago  Coronary artery disease  Other Active Problems  BPH  Other Pertinent History  Hx on-small cell lung cancer  PE, not on anticoagulation  Filter placed post knee surgery for blood clots  Hospital day # 2  Burnetta Sabin, MSN, APRN, ANVP-BC, AGPCNP-BC [facility] Pager: [phone] 11/20/2014 10:06 AM  I have personally examined this patient, reviewed notes, independently viewed imaging studies, participated in medical decision making and plan of care. I have made any additions or clarifications directly to the above note. Agree with note above. Agree with  transfer to inpatient rehabilitation. Stroke team  will sign off. Kindly call for questions.  Antony Contras, MD Medical Director [facility] Pager: [phone] 11/20/2014 12:23 PM    To contact Stroke Continuity provider, please refer to [email or web address] After hours, contact General Neurology

## 2014-11-20 NOTE — Discharge Summary (Signed)
PATIENT DETAILS Name: Chad Fuller Age: 80 y.o. Sex: male Date of Birth: November 18, 1926 MRN: [id number]. Admitting Physician: Rise Patience, MD ATF:[id number],YHC Marcello Moores, MD  Admit Date: 11/18/2014 Discharge date: 11/20/2014  Recommendations for Outpatient Follow-up:  Continue aggressive risk factor modification  PRIMARY DISCHARGE DIAGNOSIS:  Principal Problem: Acute [place] CVA Active Problems:   Coronary atherosclerosis   Type 2 diabetes mellitus   Aortic heart murmur   Non-small cell carcinoma of lung   Recurrent falls   Dizziness and giddiness   CVA (cerebral infarction)   Right hemiparesis      PAST MEDICAL HISTORY: Past Medical History  Diagnosis Date  . Diabetes mellitus   . Cancer     lung  . Prostate enlargement   . History of blood clots   . CAD (coronary artery disease)   . Pulmonary embolus july 2005    DISCHARGE MEDICATIONS: Current Discharge Medication List    CONTINUE these medications which have NOT CHANGED   Details  GLUCOSAMINE CHONDROITIN COMPLX PO Take 2 tablets by mouth daily.      insulin glargine (LANTUS) 100 UNIT/ML injection Inject 70 Units into the skin daily.    metFORMIN (GLUCOPHAGE-XR) 500 MG 24 hr tablet Take 500 mg by mouth daily with breakfast.    Multiple Vitamins-Minerals (MULTIVITAMINS THER. W/MINERALS) TABS Take 1 tablet by mouth daily.      silver sulfADIAZINE (SILVADENE) 1 % cream Apply 1 application topically daily. Qty: 50 g, Refills: 0    ACCU-CHEK AVIVA PLUS test strip    Associated Diagnoses: Malignant neoplasm of bronchus and lung, unspecified site    ezetimibe (ZETIA) 10 MG tablet Take 10 mg by mouth daily.   Associated Diagnoses: Malignant neoplasm of bronchus and lung, unspecified site    finasteride (PROSCAR) 5 MG tablet Take 5 mg by mouth daily.      tamsulosin (FLOMAX) 0.4 MG CAPS capsule         ALLERGIES:  No Known Allergies  BRIEF HPI:  See H&P, Labs, Consult and Test reports for all  details in brief, patient is a 80 y.o. male with Past medical history of diabetes mellitus, non-small cell lung cancer status post resection, coronary artery disease, prostate enlargement, left foot ingrowing toe presented with Slurred speech   CONSULTATIONS:   neurology  PERTINENT RADIOLOGIC STUDIES: Dg Chest 2 View  11/18/2014   CLINICAL DATA:  Dizzy, fall.  Slurred speech  EXAM: CHEST  2 VIEW  COMPARISON:  Radiograph 02/21/2014  FINDINGS: Normal cardiac silhouette. Right-sided pulmonary scarring again demonstrated and not changed from CT 08/05/2012. Lungs are hyperinflated. Degenerate spurring of the spine.  IMPRESSION: 1. No acute cardiopulmonary findings. 2. Chronic scarring and nodularity in the right upper lobe.   Electronically Signed   By: Suzy Bouchard M.D.   On: 11/18/2014 21:54   Ct Head Wo Contrast  11/18/2014   CLINICAL DATA:  Dizziness with fall. Mild headache and right forehead bruising. Initial encounter  EXAM: CT HEAD WITHOUT CONTRAST  CT CERVICAL SPINE WITHOUT CONTRAST  TECHNIQUE: Multidetector CT imaging of the head and cervical spine was performed following the standard protocol without intravenous contrast. Multiplanar CT image reconstructions of the cervical spine were also generated.  COMPARISON:  None.  FINDINGS: CT HEAD FINDINGS  Skull and Sinuses:Right forehead contusion.  No underlying fracture.  The mastoids and paranasal sinuses are essentially clear.  Orbits: No traumatic findings.  Bilateral cataract resection.  Brain: No evidence of acute abnormality, such as acute infarction, hemorrhage, hydrocephalus,  or mass lesion/mass effect. There is generalized brain atrophy. Tortuous vertebrobasilar system, usually indicating chronic hypertension.  CT CERVICAL SPINE FINDINGS  No acute fracture or malalignment. No gross cervical canal hematoma or prevertebral edema.  Advanced degenerative disc disease throughout the cervical spine. Short pedicles, posterior disc osteophyte  complexes, and posterior ligamentous thickening cause spinal canal stenosis from C3-4 to C5-6.  IMPRESSION: 1. No acute intracranial or cervical spine finding. 2. Generalized brain atrophy. 3. Degenerative disc disease with mid cervical canal stenosis.   Electronically Signed   By: Jorje Guild M.D.   On: 11/18/2014 23:54   Ct Cervical Spine Wo Contrast  11/18/2014   CLINICAL DATA:  Dizziness with fall. Mild headache and right forehead bruising. Initial encounter  EXAM: CT HEAD WITHOUT CONTRAST  CT CERVICAL SPINE WITHOUT CONTRAST  TECHNIQUE: Multidetector CT imaging of the head and cervical spine was performed following the standard protocol without intravenous contrast. Multiplanar CT image reconstructions of the cervical spine were also generated.  COMPARISON:  None.  FINDINGS: CT HEAD FINDINGS  Skull and Sinuses:Right forehead contusion.  No underlying fracture.  The mastoids and paranasal sinuses are essentially clear.  Orbits: No traumatic findings.  Bilateral cataract resection.  Brain: No evidence of acute abnormality, such as acute infarction, hemorrhage, hydrocephalus, or mass lesion/mass effect. There is generalized brain atrophy. Tortuous vertebrobasilar system, usually indicating chronic hypertension.  CT CERVICAL SPINE FINDINGS  No acute fracture or malalignment. No gross cervical canal hematoma or prevertebral edema.  Advanced degenerative disc disease throughout the cervical spine. Short pedicles, posterior disc osteophyte complexes, and posterior ligamentous thickening cause spinal canal stenosis from C3-4 to C5-6.  IMPRESSION: 1. No acute intracranial or cervical spine finding. 2. Generalized brain atrophy. 3. Degenerative disc disease with mid cervical canal stenosis.   Electronically Signed   By: Jorje Guild M.D.   On: 11/18/2014 23:54   Mri Brain Without Contrast  11/19/2014   CLINICAL DATA:  80 year old male with slurred speech and abnormal gait. Recent dizziness and fall with  forehead injury. Initial encounter.  EXAM: MRI HEAD WITHOUT CONTRAST  MRA HEAD WITHOUT CONTRAST  TECHNIQUE: Multiplanar, multiecho pulse sequences of the brain and surrounding structures were obtained without intravenous contrast. Angiographic images of the head were obtained using MRA technique without contrast.  COMPARISON:  Head and cervical spine CT 11/18/2014.  FINDINGS: MRI HEAD FINDINGS  Confluent 15 mm area of restricted diffusion in the left paracentral pons. Associated T2 and FLAIR hyperintensity. No mass effect or hemorrhage. Major intracranial vascular flow voids are preserved, there is generalized dolichoectasia of the vertebrobasilar system, resulting in some mass effect on the medulla (series 7, image 6).  No other restricted diffusion. No midline shift, mass effect, evidence of mass lesion, ventriculomegaly, extra-axial collection or acute intracranial hemorrhage. Cervicomedullary junction and pituitary are within normal limits. Multilevel degenerative changes in the cervical spine with degenerative spinal stenosis. Normal for age supratentorial gray and white matter signal.  Normal bone marrow signal. Postoperative changes to the globes. Visualized paranasal sinuses and mastoids are clear. Visible internal auditory structures appear normal. Visualized scalp soft tissues are within normal limits.  MRA HEAD FINDINGS  Antegrade flow in the posterior circulation. Dominant distal right vertebral artery. Bilateral distal vertebral artery and vertebrobasilar junction tortuosity. Dolichoectatic proximal basilar artery. Mild mass effect on the lower brainstem due to tortuous vessels. No stenosis. Normal SCA and PCA origins. Tortuous proximal PCAs. Normal right posterior communicating artery, the left is diminutive or absent. Bilateral PCA branches are  within normal limits.  Antegrade flow in both ICA siphons. Normal ophthalmic and right posterior communicating artery origins. No siphon stenosis. Patent  carotid termini. Normal MCA and ACA origins. Tortuous proximal ACA is. Anterior communicating artery within normal limits. Azygos type proximal ACA anatomy. Visualized bilateral ACA branches are tortuous but within normal limits. Tortuous proximal MCAs. Visualized bilateral MCA branches are within normal limits, some tortuous.  IMPRESSION: 1. Small acute brainstem infarct in the left paracentral pons (pontine perforated her artery territory). No mass effect or hemorrhage. 2. Intracranial artery dolichoectasia, otherwise negative intracranial MRA. 3. Otherwise normal for age non contrast brain MRI.   Electronically Signed   By: Lars Pinks M.D.   On: 11/19/2014 09:33   Mr Jodene Nam Head/brain Wo Cm  11/19/2014   CLINICAL DATA:  80 year old male with slurred speech and abnormal gait. Recent dizziness and fall with forehead injury. Initial encounter.  EXAM: MRI HEAD WITHOUT CONTRAST  MRA HEAD WITHOUT CONTRAST  TECHNIQUE: Multiplanar, multiecho pulse sequences of the brain and surrounding structures were obtained without intravenous contrast. Angiographic images of the head were obtained using MRA technique without contrast.  COMPARISON:  Head and cervical spine CT 11/18/2014.  FINDINGS: MRI HEAD FINDINGS  Confluent 15 mm area of restricted diffusion in the left paracentral pons. Associated T2 and FLAIR hyperintensity. No mass effect or hemorrhage. Major intracranial vascular flow voids are preserved, there is generalized dolichoectasia of the vertebrobasilar system, resulting in some mass effect on the medulla (series 7, image 6).  No other restricted diffusion. No midline shift, mass effect, evidence of mass lesion, ventriculomegaly, extra-axial collection or acute intracranial hemorrhage. Cervicomedullary junction and pituitary are within normal limits. Multilevel degenerative changes in the cervical spine with degenerative spinal stenosis. Normal for age supratentorial gray and white matter signal.  Normal bone marrow  signal. Postoperative changes to the globes. Visualized paranasal sinuses and mastoids are clear. Visible internal auditory structures appear normal. Visualized scalp soft tissues are within normal limits.  MRA HEAD FINDINGS  Antegrade flow in the posterior circulation. Dominant distal right vertebral artery. Bilateral distal vertebral artery and vertebrobasilar junction tortuosity. Dolichoectatic proximal basilar artery. Mild mass effect on the lower brainstem due to tortuous vessels. No stenosis. Normal SCA and PCA origins. Tortuous proximal PCAs. Normal right posterior communicating artery, the left is diminutive or absent. Bilateral PCA branches are within normal limits.  Antegrade flow in both ICA siphons. Normal ophthalmic and right posterior communicating artery origins. No siphon stenosis. Patent carotid termini. Normal MCA and ACA origins. Tortuous proximal ACA is. Anterior communicating artery within normal limits. Azygos type proximal ACA anatomy. Visualized bilateral ACA branches are tortuous but within normal limits. Tortuous proximal MCAs. Visualized bilateral MCA branches are within normal limits, some tortuous.  IMPRESSION: 1. Small acute brainstem infarct in the left paracentral pons (pontine perforated her artery territory). No mass effect or hemorrhage. 2. Intracranial artery dolichoectasia, otherwise negative intracranial MRA. 3. Otherwise normal for age non contrast brain MRI.   Electronically Signed   By: Lars Pinks M.D.   On: 11/19/2014 09:33     PERTINENT LAB RESULTS: CBC:  Recent Labs  11/18/14 2045 11/19/14 0911  WBC 7.0 6.4  HGB 14.2 13.9  HCT 41.1 40.7  PLT 217 225   CMET CMP     Component Value Date/Time   NA 141 11/19/2014 0911   NA 142 02/21/2014 0827   K 4.0 11/19/2014 0911   K 4.3 02/21/2014 0827   CL 101 11/19/2014 0911  CL 101 02/22/2013 1415   CO2 29 11/19/2014 0911   CO2 31* 02/21/2014 0827   GLUCOSE 166* 11/19/2014 0911   GLUCOSE 159* 02/21/2014 0827    GLUCOSE 254* 02/22/2013 1415   BUN 14 11/19/2014 0911   BUN 13.8 02/21/2014 0827   CREATININE 0.72 11/19/2014 0911   CREATININE 0.8 02/21/2014 0827   CALCIUM 8.9 11/19/2014 0911   CALCIUM 9.5 02/21/2014 0827   PROT 6.6 11/19/2014 0911   PROT 6.8 02/21/2014 0827   ALBUMIN 3.6 11/19/2014 0911   ALBUMIN 3.7 02/21/2014 0827   AST 18 11/19/2014 0911   AST 22 02/21/2014 0827   ALT 15 11/19/2014 0911   ALT 17 02/21/2014 0827   ALKPHOS 98 11/19/2014 0911   ALKPHOS 93 02/21/2014 0827   BILITOT 0.7 11/19/2014 0911   BILITOT 0.79 02/21/2014 0827   GFRNONAA 82* 11/19/2014 0911   GFRAA >90 11/19/2014 0911    GFR Estimated Creatinine Clearance: 83.6 mL/min (by C-G formula based on Cr of 0.72). No results for input(s): LIPASE, AMYLASE in the last 72 hours.  Recent Labs  11/18/14 2045  TROPONINI <0.30   Invalid input(s): POCBNP No results for input(s): DDIMER in the last 72 hours.  Recent Labs  11/19/14 0710  HGBA1C 7.9*    Recent Labs  11/19/14 0710  CHOL 120  HDL 36*  LDLCALC 70  TRIG 72  CHOLHDL 3.3   No results for input(s): TSH, T4TOTAL, T3FREE, THYROIDAB in the last 72 hours.  Invalid input(s): FREET3 No results for input(s): VITAMINB12, FOLATE, FERRITIN, TIBC, IRON, RETICCTPCT in the last 72 hours. Coags:  Recent Labs  11/19/14 0911  INR 1.13   Microbiology: Recent Results (from the past 240 hour(s))  Urine culture     Status: None   Collection Time: 11/19/14  4:40 AM  Result Value Ref Range Status   Specimen Description URINE, CLEAN CATCH  Final   Special Requests NONE  Final   Culture  Setup Time   Final    11/19/2014 09:56 Performed at [place] Performed at [facility]   Final   Culture NO GROWTH Performed at [facility]   Final   Report Status 11/20/2014 FINAL  Final     BRIEF HOSPITAL COURSE:  Acute pontine CVA: Patient admitted with slurred speech and facial droop. Suspicion for  CVA, admitted and inpatient workup commenced.MRI of the brain shows small infarct in the left paracentral pons.2D Echo negative for embolic source. Carotid doppler does not show any significant stenosis. Lipid panel shows LDL at 70. A1C at 7.9. Seen by neurology, recommendations are to continue aspirin. Neurology recommended to continue with Zetia as LDL almost at goal. Will need further optimization of her diabetic regimen to bring down A1c further.  Active Problems: Hypertension: Chronic problem. Last BP 127/68. Monitor off antihypertensives for now-will resume HCTZ at discharge.   Diabetes mellitus, type 2: Chronic problem. A1C as above. Continue Lantus, and SSI. Will resume Tradjenta/Metformin and Glipizide at discharge.   Hx of on-small cell carcinoma of lung: Diagnosed 09/2004. RUL wedge reseection with lymph node dissection in 12/22/04. No evidence of recurrence. Followed by [facility] in oncology.    TODAY-DAY OF DISCHARGE:  Subjective:   Lavaughn Haberle today has no headache,no chest abdominal pain,no new weakness tingling or numbness, feels much better. Continues to have some mild dysarthria  Objective:   Blood pressure 120/72, pulse 81, temperature 97.5 F (36.4 C), temperature source Oral, resp.  rate 20, height 6\' 2"  (1.88 m), weight 99.791 kg (220 lb), SpO2 97 %.  Intake/Output Summary (Last 24 hours) at 11/20/14 1504 Last data filed at 11/20/14 1336  Gross per 24 hour  Intake    483 ml  Output    600 ml  Net   -117 ml   Filed Weights   11/18/14 2003  Weight: 99.791 kg (220 lb)    Exam Awake Alert, Oriented *3, No new F.N deficits, Normal affect [place].AT,PERRAL Supple Neck,No JVD, No cervical lymphadenopathy appriciated.  Symmetrical Chest wall movement, Good air movement bilaterally, CTAB RRR,No Gallops,Rubs or new Murmurs, No Parasternal Heave +ve B.Sounds, Abd Soft, Non tender, No organomegaly appriciated, No rebound -guarding or rigidity. No Cyanosis,  Clubbing or edema, No new Rash or bruise  DISCHARGE CONDITION: Stable  DISPOSITION: CIR  DISCHARGE INSTRUCTIONS:    Activity:  As tolerated with Full fall precautions use walker/cane & assistance as needed  Diet recommendation: Diabetic Diet Heart Healthy diet Dysphagia 3 diet  Discharge Instructions    Ambulatory referral to Neurology    Complete by:  As directed   Stroke patient. Dr. Leonie Man prefers follow up in 1 month           Follow-up Information    Follow up with SETHI,PRAMOD, MD In 1 month.   Specialties:  Neurology, Radiology   Why:  Stroke Clinic, Office will call you with appointment date & time   Contact information:   [place] [address] [phone]       Follow up with Mathews Argyle, MD. Schedule an appointment as soon as possible for a visit in 1 week.   Specialty:  Internal Medicine   Why:  after discharge from CIR   Contact information:   [address] [place] [address] [phone]         Total Time spent on discharge equals 45 minutes.  SignedOren Binet 11/20/2014 3:04 PM

## 2014-11-20 NOTE — Progress Notes (Signed)
Pt is being discharged to rehab. Report was given to [employer] 

## 2014-11-20 NOTE — Progress Notes (Signed)
PATIENT DETAILS Name: Chad Fuller Age: 80 y.o. Sex: male Date of Birth: December 04, 1926 Admit Date: 11/18/2014 Admitting Physician Rise Patience, MD ZOX:[id number],WJX THOMAS, MD  Subjective: Speech essentially unchanged compared to yesterday.  Assessment/Plan: Principal Problem:   Acute pontine CVA: Patient admitted with slurred speech and facial droop. Suspicion for CVA, admitted and inpatient workup commenced.MRI of the brain shows small infarct in the left paracentral pons.2D Echo negative for embolic source. Carotid doppler does not show any significant stenosis. Lipid panel shows  LDL at 70. A1C at 7.9. Seen by neurology, recommendations are to continue aspirin. Neurology recommended to continue with Zetia as LDL almost at goal. Will need further optimization of her diabetic regimen to bring down A1c further.  Active Problems:  Hypertension: Chronic problem. Last BP 127/68. Monitor off antihypertensives for now-will resume HCTZ at discharge.   Diabetes mellitus, type 2: Chronic problem. A1C as above. Continue Lantus, and SSI. Will resume Tradjenta/Metformin and Glipizide at discharge.   Hx of on-small cell carcinoma of lung: Diagnosed 09/2004. RUL wedge reseection with lymph node dissection in 12/22/04. No evidence of recurrence. Followed by [facility] in oncology.   Disposition: Remain inpatient-Stable for CIR  Antibiotics: None  Anti-infectives    None      DVT Prophylaxis: Prophylactic Lovenox   Code Status: Full code   Family Communication Daughter at bedside on 11/23  Procedures:  None  CONSULTS:  neurology  Time spent 40 minutes-which includes 50% of the time with face-to-face with patient/ family and coordinating care related to the above assessment and plan.  MEDICATIONS: Scheduled Meds: . aspirin  325 mg Oral Daily  . enoxaparin (LOVENOX) injection  40 mg Subcutaneous Q24H  . ezetimibe  10 mg Oral Daily  . finasteride  5  mg Oral Daily  . insulin aspart  0-15 Units Subcutaneous TID WC  . insulin glargine  70 Units Subcutaneous Daily   Continuous Infusions:  PRN Meds:.food thickener    PHYSICAL EXAM: Vital signs in last 24 hours: Filed Vitals:   11/19/14 2138 11/20/14 0144 11/20/14 0554 11/20/14 0940  BP: 159/81 168/76 139/76 143/71  Pulse: 83 75 76 77  Temp: 97.7 F (36.5 C) 97.8 F (36.6 C) 98.6 F (37 C) 97.4 F (36.3 C)  TempSrc: Oral Oral Oral Oral  Resp: 20 18 18 20   Height:      Weight:      SpO2: 95% 97% 97% 95%    Weight change:  Filed Weights   11/18/14 2003  Weight: 99.791 kg (220 lb)   Body mass index is 28.23 kg/(m^2).   Gen Exam: Awake and alert. Still with some mild facial droop and dysarthria  Neck: Supple, No JVD.   Chest: B/L Clear.   CVS: S1 S2 Regular, no murmurs.  Abdomen: soft, BS +, non tender, non distended.  Extremities: no edema, lower extremities warm to touch. Neurologic: Non Focal.   Skin: No Rash.   Wounds: N/A.   Intake/Output from previous day:  Intake/Output Summary (Last 24 hours) at 11/20/14 1112 Last data filed at 11/20/14 0526  Gross per 24 hour  Intake    243 ml  Output    600 ml  Net   -357 ml     LAB RESULTS: CBC  Recent Labs Lab 11/18/14 2045 11/19/14 0911  WBC 7.0 6.4  HGB 14.2 13.9  HCT 41.1 40.7  PLT 217 225  MCV 90.9 93.3  MCH 31.4 31.9  MCHC  34.5 34.2  RDW 12.6 12.5  LYMPHSABS 1.3 1.8  MONOABS 0.4 0.6  EOSABS 0.1 0.1  BASOSABS 0.0 0.0    Chemistries   Recent Labs Lab 11/18/14 2045 11/19/14 0911  NA 138 141  K 4.0 4.0  CL 98 101  CO2 26 29  GLUCOSE 248* 166*  BUN 15 14  CREATININE 0.69 0.72  CALCIUM 9.6 8.9    CBG:  Recent Labs Lab 11/19/14 0702 11/19/14 1148 11/19/14 1628 11/19/14 2140 11/20/14 0635  GLUCAP 212* 274* 239* 232* 148*    GFR Estimated Creatinine Clearance: 83.6 mL/min (by C-G formula based on Cr of 0.72).  Coagulation profile  Recent Labs Lab 11/19/14 0911  INR 1.13     Cardiac Enzymes  Recent Labs Lab 11/18/14 2045  TROPONINI <0.30    Invalid input(s): POCBNP No results for input(s): DDIMER in the last 72 hours.  Recent Labs  11/19/14 0710  HGBA1C 7.9*    Recent Labs  11/19/14 0710  CHOL 120  HDL 36*  LDLCALC 70  TRIG 72  CHOLHDL 3.3   No results for input(s): TSH, T4TOTAL, T3FREE, THYROIDAB in the last 72 hours.  Invalid input(s): FREET3 No results for input(s): VITAMINB12, FOLATE, FERRITIN, TIBC, IRON, RETICCTPCT in the last 72 hours. No results for input(s): LIPASE, AMYLASE in the last 72 hours.  Urine Studies No results for input(s): UHGB, CRYS in the last 72 hours.  Invalid input(s): UACOL, UAPR, USPG, UPH, UTP, UGL, UKET, UBIL, UNIT, UROB, ULEU, UEPI, UWBC, URBC, UBAC, CAST, UCOM, BILUA  MICROBIOLOGY: No results found for this or any previous visit (from the past 240 hour(s)).  RADIOLOGY STUDIES/RESULTS: Dg Chest 2 View  11/18/2014   CLINICAL DATA:  Dizzy, fall.  Slurred speech  EXAM: CHEST  2 VIEW  COMPARISON:  Radiograph 02/21/2014  FINDINGS: Normal cardiac silhouette. Right-sided pulmonary scarring again demonstrated and not changed from CT 08/05/2012. Lungs are hyperinflated. Degenerate spurring of the spine.  IMPRESSION: 1. No acute cardiopulmonary findings. 2. Chronic scarring and nodularity in the right upper lobe.   Electronically Signed   By: Suzy Bouchard M.D.   On: 11/18/2014 21:54   Ct Head Wo Contrast  11/18/2014   CLINICAL DATA:  Dizziness with fall. Mild headache and right forehead bruising. Initial encounter  EXAM: CT HEAD WITHOUT CONTRAST  CT CERVICAL SPINE WITHOUT CONTRAST  TECHNIQUE: Multidetector CT imaging of the head and cervical spine was performed following the standard protocol without intravenous contrast. Multiplanar CT image reconstructions of the cervical spine were also generated.  COMPARISON:  None.  FINDINGS: CT HEAD FINDINGS  Skull and Sinuses:Right forehead contusion.  No underlying  fracture.  The mastoids and paranasal sinuses are essentially clear.  Orbits: No traumatic findings.  Bilateral cataract resection.  Brain: No evidence of acute abnormality, such as acute infarction, hemorrhage, hydrocephalus, or mass lesion/mass effect. There is generalized brain atrophy. Tortuous vertebrobasilar system, usually indicating chronic hypertension.  CT CERVICAL SPINE FINDINGS  No acute fracture or malalignment. No gross cervical canal hematoma or prevertebral edema.  Advanced degenerative disc disease throughout the cervical spine. Short pedicles, posterior disc osteophyte complexes, and posterior ligamentous thickening cause spinal canal stenosis from C3-4 to C5-6.  IMPRESSION: 1. No acute intracranial or cervical spine finding. 2. Generalized brain atrophy. 3. Degenerative disc disease with mid cervical canal stenosis.   Electronically Signed   By: Jorje Guild M.D.   On: 11/18/2014 23:54   Ct Cervical Spine Wo Contrast  11/18/2014   CLINICAL DATA:  Dizziness with fall. Mild headache and right forehead bruising. Initial encounter  EXAM: CT HEAD WITHOUT CONTRAST  CT CERVICAL SPINE WITHOUT CONTRAST  TECHNIQUE: Multidetector CT imaging of the head and cervical spine was performed following the standard protocol without intravenous contrast. Multiplanar CT image reconstructions of the cervical spine were also generated.  COMPARISON:  None.  FINDINGS: CT HEAD FINDINGS  Skull and Sinuses:Right forehead contusion.  No underlying fracture.  The mastoids and paranasal sinuses are essentially clear.  Orbits: No traumatic findings.  Bilateral cataract resection.  Brain: No evidence of acute abnormality, such as acute infarction, hemorrhage, hydrocephalus, or mass lesion/mass effect. There is generalized brain atrophy. Tortuous vertebrobasilar system, usually indicating chronic hypertension.  CT CERVICAL SPINE FINDINGS  No acute fracture or malalignment. No gross cervical canal hematoma or prevertebral  edema.  Advanced degenerative disc disease throughout the cervical spine. Short pedicles, posterior disc osteophyte complexes, and posterior ligamentous thickening cause spinal canal stenosis from C3-4 to C5-6.  IMPRESSION: 1. No acute intracranial or cervical spine finding. 2. Generalized brain atrophy. 3. Degenerative disc disease with mid cervical canal stenosis.   Electronically Signed   By: Jorje Guild M.D.   On: 11/18/2014 23:54   Mri Brain Without Contrast  11/19/2014   CLINICAL DATA:  80 year old male with slurred speech and abnormal gait. Recent dizziness and fall with forehead injury. Initial encounter.  EXAM: MRI HEAD WITHOUT CONTRAST  MRA HEAD WITHOUT CONTRAST  TECHNIQUE: Multiplanar, multiecho pulse sequences of the brain and surrounding structures were obtained without intravenous contrast. Angiographic images of the head were obtained using MRA technique without contrast.  COMPARISON:  Head and cervical spine CT 11/18/2014.  FINDINGS: MRI HEAD FINDINGS  Confluent 15 mm area of restricted diffusion in the left paracentral pons. Associated T2 and FLAIR hyperintensity. No mass effect or hemorrhage. Major intracranial vascular flow voids are preserved, there is generalized dolichoectasia of the vertebrobasilar system, resulting in some mass effect on the medulla (series 7, image 6).  No other restricted diffusion. No midline shift, mass effect, evidence of mass lesion, ventriculomegaly, extra-axial collection or acute intracranial hemorrhage. Cervicomedullary junction and pituitary are within normal limits. Multilevel degenerative changes in the cervical spine with degenerative spinal stenosis. Normal for age supratentorial gray and white matter signal.  Normal bone marrow signal. Postoperative changes to the globes. Visualized paranasal sinuses and mastoids are clear. Visible internal auditory structures appear normal. Visualized scalp soft tissues are within normal limits.  MRA HEAD FINDINGS   Antegrade flow in the posterior circulation. Dominant distal right vertebral artery. Bilateral distal vertebral artery and vertebrobasilar junction tortuosity. Dolichoectatic proximal basilar artery. Mild mass effect on the lower brainstem due to tortuous vessels. No stenosis. Normal SCA and PCA origins. Tortuous proximal PCAs. Normal right posterior communicating artery, the left is diminutive or absent. Bilateral PCA branches are within normal limits.  Antegrade flow in both ICA siphons. Normal ophthalmic and right posterior communicating artery origins. No siphon stenosis. Patent carotid termini. Normal MCA and ACA origins. Tortuous proximal ACA is. Anterior communicating artery within normal limits. Azygos type proximal ACA anatomy. Visualized bilateral ACA branches are tortuous but within normal limits. Tortuous proximal MCAs. Visualized bilateral MCA branches are within normal limits, some tortuous.  IMPRESSION: 1. Small acute brainstem infarct in the left paracentral pons (pontine perforated her artery territory). No mass effect or hemorrhage. 2. Intracranial artery dolichoectasia, otherwise negative intracranial MRA. 3. Otherwise normal for age non contrast brain MRI.   Electronically Signed   By: Truman Hayward  [place] Crane M.D.   On: 11/19/2014 09:33   Mr Jodene Nam Head/brain Wo Cm  11/19/2014   CLINICAL DATA:  80 year old male with slurred speech and abnormal gait. Recent dizziness and fall with forehead injury. Initial encounter.  EXAM: MRI HEAD WITHOUT CONTRAST  MRA HEAD WITHOUT CONTRAST  TECHNIQUE: Multiplanar, multiecho pulse sequences of the brain and surrounding structures were obtained without intravenous contrast. Angiographic images of the head were obtained using MRA technique without contrast.  COMPARISON:  Head and cervical spine CT 11/18/2014.  FINDINGS: MRI HEAD FINDINGS  Confluent 15 mm area of restricted diffusion in the left paracentral pons. Associated T2 and FLAIR hyperintensity. No mass effect or  hemorrhage. Major intracranial vascular flow voids are preserved, there is generalized dolichoectasia of the vertebrobasilar system, resulting in some mass effect on the medulla (series 7, image 6).  No other restricted diffusion. No midline shift, mass effect, evidence of mass lesion, ventriculomegaly, extra-axial collection or acute intracranial hemorrhage. Cervicomedullary junction and pituitary are within normal limits. Multilevel degenerative changes in the cervical spine with degenerative spinal stenosis. Normal for age supratentorial gray and white matter signal.  Normal bone marrow signal. Postoperative changes to the globes. Visualized paranasal sinuses and mastoids are clear. Visible internal auditory structures appear normal. Visualized scalp soft tissues are within normal limits.  MRA HEAD FINDINGS  Antegrade flow in the posterior circulation. Dominant distal right vertebral artery. Bilateral distal vertebral artery and vertebrobasilar junction tortuosity. Dolichoectatic proximal basilar artery. Mild mass effect on the lower brainstem due to tortuous vessels. No stenosis. Normal SCA and PCA origins. Tortuous proximal PCAs. Normal right posterior communicating artery, the left is diminutive or absent. Bilateral PCA branches are within normal limits.  Antegrade flow in both ICA siphons. Normal ophthalmic and right posterior communicating artery origins. No siphon stenosis. Patent carotid termini. Normal MCA and ACA origins. Tortuous proximal ACA is. Anterior communicating artery within normal limits. Azygos type proximal ACA anatomy. Visualized bilateral ACA branches are tortuous but within normal limits. Tortuous proximal MCAs. Visualized bilateral MCA branches are within normal limits, some tortuous.  IMPRESSION: 1. Small acute brainstem infarct in the left paracentral pons (pontine perforated her artery territory). No mass effect or hemorrhage. 2. Intracranial artery dolichoectasia, otherwise negative  intracranial MRA. 3. Otherwise normal for age non contrast brain MRI.   Electronically Signed   By: Lars Pinks M.D.   On: 11/19/2014 09:33    Oren Binet, MD  [facility] Pager:[phone]  If 7PM-7AM, please contact night-coverage [email or web address] Password [email or web address] 11/20/2014, 11:12 AM   LOS: 2 days

## 2014-11-20 NOTE — Progress Notes (Signed)
[name], MD Physician Signed Physical Medicine and Rehabilitation PMR Pre-admission 11/20/2014 4:12 PM  Related encounter: ED to Hosp-Admission (Current) from 11/18/2014 in [place] Collapse All   PMR Admission Coordinator Pre-Admission Assessment  Patient: Chad Fuller is an 80 y.o., male MRN: [id number] DOB: [birth year] Height: 6\' 2"  (188 cm) Weight: 99.791 kg (220 lb)  Insurance Information HMO: PPO: PCP: IPA: 80/20: OTHER:  PRIMARY: Medicare  A and B Policy#: [id number] Subscriber: self CM Name: Phone#: Fax#:  Pre-Cert#: Employer: retired Benefits: Phone #: Name:  Eff. Date: 08/28/1993 Deduct: $1260 Out of Pocket Max: Life Max:  CIR: 100% SNF: 100% first 20 days Per medicare guidelines Outpatient: 80% Co-Pay: 20% Home Health: 100% Co-Pay:  DME: 80% Co-Pay: 20% Providers: Pt. choice SECONDARY: [facility] Policy#: [id number] Subscriber: self     Emergency Contact Information Contact Information    Name Relation Home Work [place] Daughter [phone]     Prentis, Langdon   [phone]     Current Medical History  Patient Admitting Diagnosis: left paracentral pontine infarct History of Present Illness: JAMAEL HOFFMANN is an 80 year old male with history of DM type 2 with neuropathy, PE, NSCLC s/p resection, CAD who was admitted on 11/18/14 with slurred speech, dizziness and difficulty walking with fall. CT head without acute changes, generalized atrophy and right forehead contusion. CT cervical spine with stenosis C3/4 to C 5/6. MRI/MRA brain with Small acute brainstem infarct in the left paracentral pons and no  significant stenosis. 2D echo with EF 60%, AV sclerosis and mitral annular calcification without stenosis. Carotid dopplers without significant stenosis. Neurology recommended ASA for thrombotic stroke due to SVD. Speech therapy evaluation with dysphagia and FEES done today and patient to continue on Dysphagia 3, nectar liquids by spoon. Patient with resultant ataxia, right sided weakness with RLE instability, dysphagia as well as apraxia. Therapy ongoing and CIR recommended for follow up therapy Total: 8 [facility]    Past Medical History  Past Medical History  Diagnosis Date  . Diabetes mellitus   . Cancer     lung  . Prostate enlargement   . History of blood clots   . CAD (coronary artery disease)   . Pulmonary embolus july 2005    Family History  family history is not on file.  Prior Rehab/Hospitalizations: prior OP rehab following L TKA  Current Medications  Current facility-administered medications: aspirin tablet 325 mg, 325 mg, Oral, Daily, Jonetta Osgood, MD, 325 mg at 11/20/14 1030; enoxaparin (LOVENOX) injection 40 mg, 40 mg, Subcutaneous, Q24H, Berle Mull, MD, 40 mg at 11/20/14 1030; ezetimibe (ZETIA) tablet 10 mg, 10 mg, Oral, Daily, Berle Mull, MD, 10 mg at 11/20/14 1030; finasteride (PROSCAR) tablet 5 mg, 5 mg, Oral, Daily, Berle Mull, MD, 5 mg at 11/20/14 1030 food thickener (THICK IT) powder, , Oral, PRN, Jonetta Osgood, MD; insulin aspart (novoLOG) injection 0-15 Units, 0-15 Units, Subcutaneous, TID WC, Berle Mull, MD, 3 Units at 11/20/14 1159; insulin glargine (LANTUS) injection 70 Units, 70 Units, Subcutaneous, Daily, Berle Mull, MD, 70 Units at 11/20/14 1342  Patients Current Diet: DIET DYS 3; nectar thick  Precautions / Restrictions Precautions Precautions: Fall Precaution Comments: multiple falls at home Restrictions Weight Bearing Restrictions: No   Prior Activity Level Community (5-7x/wk): Pt. 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 Reports, "throwing himself up the steps."  Current Functional Level Cognition  Overall Cognitive Status: Within Functional Limits for tasks assessed Orientation Level: Oriented to person, Oriented to time, Disoriented to place, Disoriented to situation   Extremity Assessment (includes Sensation/Coordination)          ADLs  Overall ADL's : Needs assistance/impaired Eating/Feeding: Supervision/ safety, Sitting Eating/Feeding Details (indicate cue type and reason): Using R hand, pt able to drink thickened cranberry juice with supervision. No choking observed Grooming: Oral care, Moderate assistance, Cueing for safety, Sitting Toilet Transfer: Minimal assistance, Cueing for safety, Ambulation, Regular Toilet, RW Toilet Transfer Details (indicate cue type and reason): Min (A) for safe descend. Verbal cues for hand placement on RW. Toileting- Water quality scientist and Hygiene: Min guard, Sitting/lateral lean Toileting - Clothing Manipulation Details (indicate cue type and reason): Pt demonstrating good L lateral lean. Functional mobility during ADLs: Moderate assistance, Cueing for safety, Rolling walker General ADL Comments: Impulsive. Mod (A) for ambulation due to R foot dragging, R knee buckling, and R lateral lean. Pt demonstrating significant R lateral lean during static and dynamic balance activities. Had to sit pt down to complete oral care for safety. Pt actively using and manipulating objects with R hand without verbal or tactile cues.     Mobility  Overal bed mobility:  Needs Assistance Bed Mobility: Supine to Sit Supine to sit: Supervision General bed mobility comments: Received in bathroom with tech finishing a shower.    Transfers  Overall transfer level: Needs assistance Equipment used: None Transfers: Sit to/from Stand Sit to Stand: Min guard General transfer comment: Min guard for steadying/balance upon standing due to impulsiveness. Multiple stand from recliner. No R knee buckling today. Able to stand upright without R lateral lean.    Ambulation / Gait / Stairs / Wheelchair Mobility  Ambulation/Gait Ambulation/Gait assistance: Museum/gallery curator (Feet): 50 Feet (x3 bouts) Assistive device: 1 person hand held assist, None (rail.) Gait Pattern/deviations: Step-through pattern, Decreased stride length, Decreased dorsiflexion - right, Decreased weight shift to left Gait velocity: decreased Gait velocity interpretation: Below normal speed for age/gender General Gait Details: Required Min A (manual facilitation of hip flexors) to advance RLE during swing phase due to decreased DF resulting in dragging of RLE- most notable when fatigued. Use of rail for support. Multiple seated rest breaks. 1 LOB without rail due to impulsive behavior and forward momentum requring Min A to prevent fall.    Posture / Balance Dynamic Sitting Balance Sitting balance - Comments: Trunk control improved today, able to reach outside BoS without LOB.     Special needs/care consideration BiPAP/CPAP no  Skin Pt. Reports blister on left great toe since several weeks ago; has seen MD for it; currently covered with bandaid  Bowel mgmt: Continent, last BM 11/18/14 Bladder mgmt: continent, using urinal Diabetic mgmt yes     Previous Home Environment Living Arrangements: Alone Available Help at Discharge: Family Type of Home: House Home Layout: One level Home Access: Stairs to enter Entrance Stairs-Rails:  Right Entrance Stairs-Number of Steps: 4 Bathroom Shower/Tub: Public librarian, Ross: No  Discharge Living Setting Plans for Discharge Living Setting: Patient's home Type of Home at Discharge: House Discharge Home Layout: One level Discharge Home Access: Stairs to enter Entrance  Stairs-Rails: Left, Right Entrance Stairs-Number of Steps: 4 Discharge Bathroom Shower/Tub: Tub/shower unit Discharge Bathroom Toilet: Standard Discharge Bathroom Accessibility: Yes How Accessible: Accessible via walker Does the patient have any problems obtaining your medications?: No  Social/Family/Support Systems Patient Roles: Parent (wife died 8 1/2 years ago) Anticipated Caregiver: multiple family members plan to rotate schedule; Eldest daughter  [name] is primary contacxt Anticipated Caregiver's Contact Information: [name] (eldest daughter) [phone] Ability/Limitations of Caregiver: family indicates they will arrange a schedule to be with their dad 24/7; . All have physical limitationss due to their own health situations but believe they can manage up to min assisty level. Caregiver Availability: 24/7 Discharge Plan Discussed with Primary Caregiver: Yes Is Caregiver In Agreement with Plan?: Yes Does Caregiver/Family have Issues with Lodging/Transportation while Pt is in Rehab?: No    Goals/Additional Needs Patient/Family Goal for Rehab: supervision PT; supervision to min assist for OT and SLP Expected length of stay: 15-20 days Cultural Considerations: "Production manager Needs: TBD Pt/Family Agrees to Admission and willing to participate: Yes Program Orientation Provided & Reviewed with Pt/Caregiver Including Roles & Responsibilities: Yes   Decrease burden of Care through IP rehab admission: no   Possible need for SNF placement upon discharge: Not anticipated    Patient Condition: This patient's condition remains as documented in the consult dated 11/19/14, in  which the Rehabilitation Physician determined and documented that the patient's condition is appropriate for intensive rehabilitative care in an inpatient rehabilitation facility. Will admit to inpatient rehab today.  Preadmission Screen Completed By: Gerlean Ren, 11/20/2014 4:12 PM ______________________________________________________________________  Discussed status with Dr. Naaman Plummer on 11/20/14 at 1623 and received telephone approval for admission today.  Admission Coordinator: Gerlean Ren, time 0355 /Date 11/20/14          Revision History     Date/Time User Provider Type Action   11/20/2014 4:32 PM [name], MD Physician Sign   11/20/2014 4:25 PM Gerlean Ren Rehab Admission Coordinator Sign   View Details Report

## 2014-11-20 NOTE — H&P (Signed)
Physical Medicine and Rehabilitation Admission H&P    Chief Complaint  Patient presents with  . Gait instability, slurred speech and difficulty swallowing.    HPI:  Chad Fuller is an 79 year old male with history of DM type 2 with neuropathy, PE, NSCLC s/p resection, CAD who was admitted on 11/18/14 with slurred speech, dizziness and difficulty walking with fall. CT head without acute changes, generalized atrophy and right forehead contusion. CT cervical spine with stenosis C3/4 to C 5/6. MRI/MRA brain with Small acute brainstem infarct in the left paracentral pons and no significant stenosis.  2D echo with EF 60%, AV sclerosis and mitral annular calcification without stenosis. Carotid dopplers without significant stenosis. Neurology recommended ASA for thrombotic stroke due to SVD. Speech therapy evaluation with dysphagia and FEES done today and patient to continue on Dysphagia 3, nectar liquids by spoon.  Patient with resultant ataxia, right sided weakness with RLE instability, dysphagia as well as apraxia. Therapy ongoing and CIR recommended for follow up therapy.  Pt was screened by the rehab team who felt he could benefit from our inpatient program.  Review of Systems  HENT: Positive for hearing loss.   Eyes: Negative for blurred vision and double vision.  Respiratory: Negative for cough and shortness of breath.   Cardiovascular: Positive for leg swelling (chronic issues with left ankle edema ). Negative for chest pain and palpitations.  Gastrointestinal: Positive for heartburn (chronic). Negative for nausea, vomiting and abdominal pain.  Genitourinary: Negative for urgency and frequency.  Musculoskeletal: Positive for myalgias, back pain and joint pain.  Neurological: Positive for sensory change (numbnesss from knees down), speech change and focal weakness. Negative for dizziness and headaches.  Psychiatric/Behavioral: The patient is nervous/anxious and has insomnia.       Past  Medical History  Diagnosis Date  . Diabetes mellitus   . Cancer     lung  . Prostate enlargement   . History of blood clots   . CAD (coronary artery disease)   . Pulmonary embolus july 2005    Past Surgical History  Procedure Laterality Date  . Lung removal, partial    . Coronary stent placement    . Knee surgery    . Filtering procedure      reports filter placed after knee surgery for blood clots  . I&d extremity  08/16/2012    Procedure: MINOR IRRIGATION AND DEBRIDEMENT EXTREMITY;  Surgeon: Tennis Must, MD;  Location: [place];  Service: Orthopedics;  Laterality: Right;    History reviewed. No pertinent family history.    Social History:  Widowed. Lives alone. Retired Administrator. Has a large supportive family. He  reports that he quit smoking about 40 years ago. His smoking use included Cigarettes. He does not have any smokeless tobacco history on file. He reports that he does not drink alcohol or use illicit drugs.    Allergies: No Known Allergies    Medications Prior to Admission  Medication Dose Route Frequency Provider Last Rate Last Dose  . [DISCONTINUED] triamcinolone acetonide (KENALOG) 10 MG/ML injection 10 mg  10 mg Intra-articular Once Bronson Ing, DPM       Medications Prior to Admission  Medication Sig Dispense Refill  . GLUCOSAMINE CHONDROITIN COMPLX PO Take 2 tablets by mouth daily.      . insulin glargine (LANTUS) 100 UNIT/ML injection Inject 70 Units into the skin daily.    . metFORMIN (GLUCOPHAGE-XR) 500 MG 24 hr tablet Take 500  mg by mouth daily with breakfast.    . Multiple Vitamins-Minerals (MULTIVITAMINS THER. W/MINERALS) TABS Take 1 tablet by mouth daily.      . silver sulfADIAZINE (SILVADENE) 1 % cream Apply 1 application topically daily. 50 g 0  . ACCU-CHEK AVIVA PLUS test strip     . ezetimibe (ZETIA) 10 MG tablet Take 10 mg by mouth daily.    . finasteride (PROSCAR) 5 MG tablet Take 5 mg by mouth daily.      .  tamsulosin (FLOMAX) 0.4 MG CAPS capsule       Home: Home Living Family/patient expects to be discharged to:: Private residence Living Arrangements: Alone Available Help at Discharge: Family Type of Home: House Home Access: Stairs to enter Technical brewer of Steps: 4 Entrance Stairs-Rails: Right Home Layout: One level Home Equipment: [place] - single point, Environmental consultant - 4 wheels   Functional History: Prior Function Level of Independence: Independent Comments: Pt reports doing some cooking, driving and independent for ADLs. Reports, "throwing himself up the steps."  Functional Status:  Mobility: Bed Mobility Overal bed mobility: Needs Assistance Bed Mobility: Supine to Sit Supine to sit: Supervision General bed mobility comments: Received in bathroom with tech finishing a shower. Transfers Overall transfer level: Needs assistance Equipment used: None Transfers: Sit to/from Stand Sit to Stand: Min guard General transfer comment: Min guard for steadying/balance upon standing due to impulsiveness. Multiple stand from recliner. No R knee buckling today. Able to stand upright without R lateral lean. Ambulation/Gait Ambulation/Gait assistance: Min assist Ambulation Distance (Feet): 50 Feet (x3 bouts) Assistive device: 1 person hand held assist, None (rail.) Gait Pattern/deviations: Step-through pattern, Decreased stride length, Decreased dorsiflexion - right, Decreased weight shift to left Gait velocity: decreased Gait velocity interpretation: Below normal speed for age/gender General Gait Details: Required Min A (manual facilitation of hip flexors) to advance RLE during swing phase due to decreased DF resulting in dragging of RLE- most notable when fatigued. Use of rail for support. Multiple seated rest breaks. 1 LOB without rail due to impulsive behavior and forward momentum requring Min A to prevent fall.    ADL: ADL Overall ADL's : Needs assistance/impaired Eating/Feeding:  Supervision/ safety, Sitting Eating/Feeding Details (indicate cue type and reason): Using R hand, pt able to drink thickened cranberry juice with supervision. No choking observed Grooming: Oral care, Moderate assistance, Cueing for safety, Sitting Toilet Transfer: Minimal assistance, Cueing for safety, Ambulation, Regular Toilet, RW Toilet Transfer Details (indicate cue type and reason): Min (A) for safe descend. Verbal cues for hand placement on RW. Toileting- Water quality scientist and Hygiene: Min guard, Sitting/lateral lean Toileting - Clothing Manipulation Details (indicate cue type and reason): Pt demonstrating good L lateral lean. Functional mobility during ADLs: Moderate assistance, Cueing for safety, Rolling walker General ADL Comments: Impulsive. Mod (A) for ambulation due to R foot dragging, R knee buckling, and R lateral lean. Pt demonstrating significant R lateral  lean during static and dynamic balance activities. Had to sit pt down to complete oral care for safety. Pt actively using and manipulating objects with R hand without verbal or tactile cues.   Cognition: Cognition Overall Cognitive Status: Within Functional Limits for tasks assessed Orientation Level: Oriented to person, Oriented to time, Disoriented to place, Disoriented to situation Cognition Arousal/Alertness: Awake/alert Behavior During Therapy: Impulsive Overall Cognitive Status: Within Functional Limits for tasks assessed  Physical Exam: Blood pressure 120/72, pulse 81, temperature 97.5 F (36.4 C), temperature source Oral, resp. rate 20, height 6' 2"  (1.88 m), weight 99.791  kg (220 lb), SpO2 97 %. Physical Exam Vitals reviewed. Gen: pleasant and no distress. A little fatigued HENT: tongue and lips blue from recent swallowing eval Head: Normocephalic.  Eyes: EOM are normal.  Neck: Normal range of motion. Neck Fuller. No thyromegaly present.  Cardiovascular: Normal rate and regular rhythm. no murmurs or  rubs.  Respiratory: Effort normal and breath sounds normal. No respiratory distress.  GI: Soft. Bowel sounds are normal. He exhibits no distension.  Neurological: He is alert.  Speech is   dysarthric but more intelligible. He is oriented to person place and age. He did follow simple commands. More aware of surroundings and situation. Asked pertinent questions regarding his stroke and rehab stay. Right central 7 and tongue deviation persist. No other obvious cn abnl. RUE: 2+ to 3-/5 deltoid, bicep, tricep, HI. RLE: 2+ hf,  3/5 ke, adf/apf. Still displays some apraxia. Had delays in initiating movement.  Skin: Skin is dry. Bilateral feet cool to touch. Left great toe with early blister and abraded area under toe nail  Psychiatric: He has a flat affect. cooperative     Results for orders placed or performed during the hospital encounter of 11/18/14 (from the past 48 hour(s))  CBC with Differential     Status: None   Collection Time: 11/19/14  9:11 AM  Result Value Ref Range   WBC 6.4 4.0 - 10.5 K/uL   RBC 4.36 4.22 - 5.81 MIL/uL   Hemoglobin 13.9 13.0 - 17.0 g/dL   HCT 40.7 39.0 - 52.0 %   MCV 93.3 78.0 - 100.0 fL   MCH 31.9 26.0 - 34.0 pg   MCHC 34.2 30.0 - 36.0 g/dL   RDW 12.5 11.5 - 15.5 %   Platelets 225 150 - 400 K/uL   Neutrophils Relative % 61 43 - 77 %   Neutro Abs 3.9 1.7 - 7.7 K/uL   Lymphocytes Relative 28 12 - 46 %   Lymphs Abs 1.8 0.7 - 4.0 K/uL   Monocytes Relative 9 3 - 12 %   Monocytes Absolute 0.6 0.1 - 1.0 K/uL   Eosinophils Relative 2 0 - 5 %   Eosinophils Absolute 0.1 0.0 - 0.7 K/uL   Basophils Relative 0 0 - 1 %   Basophils Absolute 0.0 0.0 - 0.1 K/uL  Comprehensive metabolic panel     Status: Abnormal   Collection Time: 11/19/14  9:11 AM  Result Value Ref Range   Sodium 141 137 - 147 mEq/L   Potassium 4.0 3.7 - 5.3 mEq/L   Chloride 101 96 - 112 mEq/L   CO2 29 19 - 32 mEq/L   Glucose, Bld 166 (H) 70 - 99 mg/dL   BUN 14 6 - 23 mg/dL   Creatinine, Ser 0.72  0.50 - 1.35 mg/dL   Calcium 8.9 8.4 - 10.5 mg/dL   Total Protein 6.6 6.0 - 8.3 g/dL   Albumin 3.6 3.5 - 5.2 g/dL   AST 18 0 - 37 U/L   ALT 15 0 - 53 U/L   Alkaline Phosphatase 98 39 - 117 U/L   Total Bilirubin 0.7 0.3 - 1.2 mg/dL   GFR calc non Af Amer 82 (L) >90 mL/min   GFR calc Af Amer >90 >90 mL/min    Comment: (NOTE) The eGFR has been calculated using the CKD EPI equation. This calculation has not been validated in all clinical situations. eGFR's persistently <90 mL/min signify possible Chronic Kidney Disease.    Anion gap 11 5 - 15  Protime-INR     Status: None   Collection Time: 11/19/14  9:11 AM  Result Value Ref Range   Prothrombin Time 14.6 11.6 - 15.2 seconds   INR 1.13 0.00 - 1.49  Glucose, capillary     Status: Abnormal   Collection Time: 11/19/14 11:48 AM  Result Value Ref Range   Glucose-Capillary 274 (H) 70 - 99 mg/dL  Glucose, capillary     Status: Abnormal   Collection Time: 11/19/14  4:28 PM  Result Value Ref Range   Glucose-Capillary 239 (H) 70 - 99 mg/dL   Comment 1 Notify RN    Comment 2 Documented in Chart   Glucose, capillary     Status: Abnormal   Collection Time: 11/19/14  9:40 PM  Result Value Ref Range   Glucose-Capillary 232 (H) 70 - 99 mg/dL  Glucose, capillary     Status: Abnormal   Collection Time: 11/20/14  6:35 AM  Result Value Ref Range   Glucose-Capillary 148 (H) 70 - 99 mg/dL   Comment 1 Documented in Chart    Comment 2 Notify RN   Glucose, capillary     Status: Abnormal   Collection Time: 11/20/14 11:42 AM  Result Value Ref Range   Glucose-Capillary 197 (H) 70 - 99 mg/dL   Comment 1 Notify RN    Comment 2 Documented in Chart    Dg Chest 2 View  11/18/2014   CLINICAL DATA:  Dizzy, fall.  Slurred speech  EXAM: CHEST  2 VIEW  COMPARISON:  Radiograph 02/21/2014  FINDINGS: Normal cardiac silhouette. Right-sided pulmonary scarring again demonstrated and not changed from CT 08/05/2012. Lungs are hyperinflated. Degenerate spurring of  the spine.  IMPRESSION: 1. No acute cardiopulmonary findings. 2. Chronic scarring and nodularity in the right upper lobe.   Electronically Signed   By: Suzy Bouchard M.D.   On: 11/18/2014 21:54   Ct Head Wo Contrast  11/18/2014   CLINICAL DATA:  Dizziness with fall. Mild headache and right forehead bruising. Initial encounter  EXAM: CT HEAD WITHOUT CONTRAST  CT CERVICAL SPINE WITHOUT CONTRAST  TECHNIQUE: Multidetector CT imaging of the head and cervical spine was performed following the standard protocol without intravenous contrast. Multiplanar CT image reconstructions of the cervical spine were also generated.  COMPARISON:  None.  FINDINGS: CT HEAD FINDINGS  Skull and Sinuses:Right forehead contusion.  No underlying fracture.  The mastoids and paranasal sinuses are essentially clear.  Orbits: No traumatic findings.  Bilateral cataract resection.  Brain: No evidence of acute abnormality, such as acute infarction, hemorrhage, hydrocephalus, or mass lesion/mass effect. There is generalized brain atrophy. Tortuous vertebrobasilar system, usually indicating chronic hypertension.  CT CERVICAL SPINE FINDINGS  No acute fracture or malalignment. No gross cervical canal hematoma or prevertebral edema.  Advanced degenerative disc disease throughout the cervical spine. Short pedicles, posterior disc osteophyte complexes, and posterior ligamentous thickening cause spinal canal stenosis from C3-4 to C5-6.  IMPRESSION: 1. No acute intracranial or cervical spine finding. 2. Generalized brain atrophy. 3. Degenerative disc disease with mid cervical canal stenosis.   Electronically Signed   By: Jorje Guild M.D.   On: 11/18/2014 23:54   Mri Brain Without Contrast   11/19/2014   CLINICAL DATA:  80 year old male with slurred speech and abnormal gait. Recent dizziness and fall with forehead injury. Initial encounter.  EXAM: MRI HEAD WITHOUT CONTRAST  MRA HEAD WITHOUT CONTRAST  TECHNIQUE: Multiplanar, multiecho pulse  sequences of the brain and surrounding structures were obtained without intravenous contrast. Angiographic  images of the head were obtained using MRA technique without contrast.  COMPARISON:  Head and cervical spine CT 11/18/2014.  FINDINGS: MRI HEAD FINDINGS  Confluent 15 mm area of restricted diffusion in the left paracentral pons. Associated T2 and FLAIR hyperintensity. No mass effect or hemorrhage. Major intracranial vascular flow voids are preserved, there is generalized dolichoectasia of the vertebrobasilar system, resulting in some mass effect on the medulla (series 7, image 6).  No other restricted diffusion. No midline shift, mass effect, evidence of mass lesion, ventriculomegaly, extra-axial collection or acute intracranial hemorrhage. Cervicomedullary junction and pituitary are within normal limits. Multilevel degenerative changes in the cervical spine with degenerative spinal stenosis. Normal for age supratentorial gray and white matter signal.  Normal bone marrow signal. Postoperative changes to the globes. Visualized paranasal sinuses and mastoids are clear. Visible internal auditory structures appear normal. Visualized scalp soft tissues are within normal limits.  MRA HEAD FINDINGS  Antegrade flow in the posterior circulation. Dominant distal right vertebral artery. Bilateral distal vertebral artery and vertebrobasilar junction tortuosity. Dolichoectatic proximal basilar artery. Mild mass effect on the lower brainstem due to tortuous vessels. No stenosis. Normal SCA and PCA origins. Tortuous proximal PCAs. Normal right posterior communicating artery, the left is diminutive or absent. Bilateral PCA branches are within normal limits.  Antegrade flow in both ICA siphons. Normal ophthalmic and right posterior communicating artery origins. No siphon stenosis. Patent carotid termini. Normal MCA and ACA origins. Tortuous proximal ACA is. Anterior communicating artery within normal limits. Azygos type  proximal ACA anatomy. Visualized bilateral ACA branches are tortuous but within normal limits. Tortuous proximal MCAs. Visualized bilateral MCA branches are within normal limits, some tortuous.  IMPRESSION: 1. Small acute brainstem infarct in the left paracentral pons (pontine perforated her artery territory). No mass effect or hemorrhage. 2. Intracranial artery dolichoectasia, otherwise negative intracranial MRA. 3. Otherwise normal for age non contrast brain MRI.   Electronically Signed   By: Lars Pinks M.D.   On: 11/19/2014 09:33       Medical Problem List and Plan: 1. Functional deficits secondary to Left paracentral pontine stroke 2.  DVT Prophylaxis/Anticoagulation: Pharmaceutical: Lovenox 3. Pain Management: Tylenol prn for back pain. Will add K pad additionally.  4. Mood: LCSW to follow for evaluation and support.  5. Neuropsych: This patient is capable of making decisions on his own behalf. 6. Skin/Wound Care: Routine pressure relief measures. Rehab RN to monitor skin daily 7. Fluids/Electrolytes/Nutrition: Monitor I/O. Offer nutritional supplement prn if intake poor.  8. DM type 2 with neuropathy: Will monitor BS with ac/hs checks. Continue lantus and resume metformin as blood sugars poorly controlled.  9.  BPH: Continue proscar. Monitor for any voiding difficulty.   10.  HTN: Will monitor blood pressure tid. Currently controlled off HCTZ.  11. GERD: will add Pepcid for symptoms.    Post Admission Physician Evaluation: 1. Functional deficits secondary  to Left paracentral pontine infarct.  2. Patient is admitted to receive collaborative, interdisciplinary care between the physiatrist, rehab nursing staff, and therapy team. 3. Patient's level of medical complexity and substantial therapy needs in context of that medical necessity cannot be provided at a lesser intensity of care such as a SNF. 4. Patient has experienced substantial functional loss from his/her baseline which was  documented above under the "Functional History" and "Functional Status" headings.  Judging by the patient's diagnosis, physical exam, and functional history, the patient has potential for functional progress which will result in measurable gains while on inpatient  rehab.  These gains will be of substantial and practical use upon discharge  in facilitating mobility and self-care at the household level. 5. Physiatrist will provide 24 hour management of medical needs as well as oversight of the therapy plan/treatment and provide guidance as appropriate regarding the interaction of the two. 6. 24 hour rehab nursing will assist with bladder management, bowel management, safety, skin/wound care, disease management, medication administration and patient education  and help integrate therapy concepts, techniques,education, etc. 7. PT will assess and treat for/with: Lower extremity strength, range of motion, stamina, balance, functional mobility, safety, adaptive techniques and equipment, NMR, visual perceptual awareness, initiation, stroke education, caregiver training.   Goals are: mod I to supervision. 8. OT will assess and treat for/with: ADL's, functional mobility, safety, upper extremity strength, adaptive techniques and equipment, NMR, communication, ego support, community reintegration.   Goals are: mod I to supervision. Therapy may proceed with showering this patient. 9. SLP will assess and treat for/with: communication, swallowing.  Goals are: mod I to supervision. 10. Case Management and Social Worker will assess and treat for psychological issues and discharge planning. 11. Team conference will be held weekly to assess progress toward goals and to determine barriers to discharge. 12. Patient will receive at least 3 hours of therapy per day at least 5 days per week. 13. ELOS: 9-14 days       14. Prognosis:  excellent     [name], MD, [facility] 11/20/2014   11/20/2014

## 2014-11-20 NOTE — Progress Notes (Addendum)
OT Cancellation Note  Patient Details Name: AMY BELLOSO MRN: [id number] DOB: 07-20-1926   Cancelled Treatment:    Reason Eval/Treat Not Completed: Patient at procedure or test/ unavailable (FEES with SLP)  Redmond Baseman 11/20/2014, 2:05 PM

## 2014-11-20 NOTE — Progress Notes (Signed)
Physical Therapy Treatment Patient Details Name: Chad Fuller MRN: [id number] DOB: [birth year] Today's Date: 11/20/2014    History of Present Illness 80 yo male admitted from home due to slurred speech, dizziness/ light headed, mild headache with R forehead bruise. pt s/p multiple fall. Baseline independent with ADLS PMH: DM, lung CA, CAD, PE, Lung partial removal, Knee surg, Bell's Pals. 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     PT Comments    Patient progressing well with mobility. Continues to require Min A for gait training due to difficulty advancing RLE especially when fatigued and impulsivity. Very motivated and cooperative with therapy. Balance improved from prior session however pt still requires hands on assist for safety due to LOB during dynamic standing activities. Discussion with daughter and patient about CIR and need for rehab prior to return home. Very agreeable and eager to start rehab.   Follow Up Recommendations  CIR;Supervision/Assistance - 24 hour     Equipment Recommendations  Other (comment)    Recommendations for Other Services       Precautions / Restrictions Precautions Precautions: Fall Precaution Comments: multiple falls at home Restrictions Weight Bearing Restrictions: No    Mobility  Bed Mobility               General bed mobility comments: Received in bathroom with tech finishing a shower.  Transfers Overall transfer level: Needs assistance Equipment used: None Transfers: Sit to/from Stand Sit to Stand: Min guard         General transfer comment: Min guard for steadying/balance upon standing due to impulsiveness. Multiple stand from recliner. No R knee buckling today. Able to stand upright without R lateral lean.  Ambulation/Gait Ambulation/Gait assistance: Min assist Ambulation Distance (Feet): 50 Feet (x3 bouts) Assistive device: 1 person hand held assist;None (rail.) Gait Pattern/deviations:  Step-through pattern;Decreased stride length;Decreased dorsiflexion - right;Decreased weight shift to left Gait velocity: decreased   General Gait Details: Required Min A (manual facilitation of hip flexors) to advance RLE during swing phase due to decreased DF resulting in dragging of RLE- most notable when fatigued. Use of rail for support. Multiple seated rest breaks. 1 LOB without rail due to impulsive behavior and forward momentum requring Min A to prevent fall.   Stairs            Wheelchair Mobility    Modified Rankin (Stroke Patients Only) Modified Rankin (Stroke Patients Only) Pre-Morbid Rankin Score: Moderate disability Modified Rankin: Moderately severe disability     Balance Overall balance assessment: Needs assistance;History of Falls Sitting-balance support: Feet supported;No upper extremity supported Sitting balance-Leahy Scale: Fair Sitting balance - Comments: Trunk control improved today, able to reach outside BoS without LOB.    Standing balance support: During functional activity Standing balance-Leahy Scale: Poor Standing balance comment: Performs dynamic standing activities without UE support - putting gel in hair and combing hair with LOB posteriorly and anteriorly requiring Min A for balance/support. Able to perform static standing without difficulty for short periods with mild sway but no LOB with cues for upright.                    Cognition Arousal/Alertness: Awake/alert Behavior During Therapy: Impulsive Overall Cognitive Status: Within Functional Limits for tasks assessed                      Exercises Other Exercises Other Exercises: Sit to stand x10 from recliner emphasizing upright posture at top of  range and slow descent into chair without UE support for focusing on eccentric control of quadriceps.    General Comments General comments (skin integrity, edema, etc.): Daughter present during session. Discussion with daughter  about criteria to get into CIR and to have other options in case pt is not accepted.      Pertinent Vitals/Pain Pain Assessment: No/denies pain    Home Living                      Prior Function            PT Goals (current goals can now be found in the care plan section) Progress towards PT goals: Progressing toward goals    Frequency  Min 4X/week    PT Plan      Co-evaluation             End of Session Equipment Utilized During Treatment: Gait belt Activity Tolerance: Patient tolerated treatment well Patient left: in chair;with call bell/phone within reach;with chair alarm set;with family/visitor present     Time: 1052-1130 PT Time Calculation (min) (ACUTE ONLY): 38 min  Charges:  $Gait Training: 23-37 mins $Therapeutic Activity: 8-22 mins                    G CodesCandy Sledge A 12-15-2014, 12:41 PM  Candy Sledge, [place], DPT [phone]

## 2014-11-20 NOTE — Clinical Social Work Placement (Signed)
[facility] NOTE 11/20/2014  Patient:  Chad Fuller, Chad Fuller  Account Number:  [id number] Admit date:  11/18/2014  Clinical Social Worker:  Glendon Axe, CLINICAL SOCIAL WORKER  Date/time:  11/19/2014 03:34 PM  Clinical Social Work is seeking post-discharge placement for this patient at the following level of care:   SKILLED NURSING   (*CSW will update this form in Epic as items are completed)   11/19/2014  Patient/family provided with [facility] Department of Clinical Social Work's list of facilities offering this level of care within the geographic area requested by the patient (or if unable, by the patient's family).  11/19/2014  Patient/family informed of their freedom to choose among providers that offer the needed level of care, that participate in Medicare, Medicaid or managed care program needed by the patient, have an available bed and are willing to accept the patient.  11/19/2014  Patient/family informed of [facility]' ownership interest in [facility], as well as of the fact that they are under no obligation to receive care at this facility.  PASARR submitted to EDS on 11/19/2014 PASARR number received on 11/19/2014  FL2 transmitted to all facilities in geographic area requested by pt/family on  11/19/2014 FL2 transmitted to all facilities within larger geographic area on   Patient informed that his/her managed care company has contracts with or will negotiate with  certain facilities, including the following:   YES     Patient/family informed of bed offers received:  11/20/2014 Patient chooses bed at  Physician recommends and patient chooses bed at    Patient to be transferred to  on   Patient to be transferred to facility by  Patient and family notified of transfer on  Name of family member notified:    The following physician request were entered in Epic:   Additional Comments:   Glendon Axe,  MSW, LCSWA [phone] 11/20/2014 3:35 PM

## 2014-11-20 NOTE — Plan of Care (Signed)
Problem: Progression Outcomes Goal: Bowel & Bladder Continence Outcome: Completed/Met Date Met:  11/20/14

## 2014-11-21 ENCOUNTER — Inpatient Hospital Stay (HOSPITAL_COMMUNITY): Payer: Medicare Other | Admitting: Speech Pathology

## 2014-11-21 ENCOUNTER — Inpatient Hospital Stay (HOSPITAL_COMMUNITY): Payer: Medicare Other | Admitting: Occupational Therapy

## 2014-11-21 ENCOUNTER — Inpatient Hospital Stay (HOSPITAL_COMMUNITY): Payer: Medicare Other

## 2014-11-21 DIAGNOSIS — E1142 Type 2 diabetes mellitus with diabetic polyneuropathy: Secondary | ICD-10-CM

## 2014-11-21 LAB — COMPREHENSIVE METABOLIC PANEL
ALK PHOS: 101 U/L (ref 39–117)
ALT: 13 U/L (ref 0–53)
AST: 17 U/L (ref 0–37)
Albumin: 3.6 g/dL (ref 3.5–5.2)
Anion gap: 12 (ref 5–15)
BUN: 17 mg/dL (ref 6–23)
CO2: 27 meq/L (ref 19–32)
Calcium: 9.1 mg/dL (ref 8.4–10.5)
Chloride: 101 mEq/L (ref 96–112)
Creatinine, Ser: 0.65 mg/dL (ref 0.50–1.35)
GFR, EST NON AFRICAN AMERICAN: 86 mL/min — AB (ref >90)
Glucose, Bld: 137 mg/dL — ABNORMAL HIGH (ref 70–99)
POTASSIUM: 4.1 meq/L (ref 3.7–5.3)
SODIUM: 140 meq/L (ref 137–147)
Total Bilirubin: 0.7 mg/dL (ref 0.3–1.2)
Total Protein: 6.5 g/dL (ref 6.0–8.3)

## 2014-11-21 LAB — CBC WITH DIFFERENTIAL/PLATELET
Basophils Absolute: 0 10*3/uL (ref 0.0–0.1)
Basophils Relative: 0 % (ref 0–1)
EOS PCT: 3 % (ref 0–5)
Eosinophils Absolute: 0.2 10*3/uL (ref 0.0–0.7)
HCT: 41.1 % (ref 39.0–52.0)
Hemoglobin: 14 g/dL (ref 13.0–17.0)
LYMPHS ABS: 1.8 10*3/uL (ref 0.7–4.0)
Lymphocytes Relative: 24 % (ref 12–46)
MCH: 31.2 pg (ref 26.0–34.0)
MCHC: 34.1 g/dL (ref 30.0–36.0)
MCV: 91.5 fL (ref 78.0–100.0)
Monocytes Absolute: 0.6 10*3/uL (ref 0.1–1.0)
Monocytes Relative: 8 % (ref 3–12)
NEUTROS PCT: 65 % (ref 43–77)
Neutro Abs: 4.8 10*3/uL (ref 1.7–7.7)
PLATELETS: 226 10*3/uL (ref 150–400)
RBC: 4.49 MIL/uL (ref 4.22–5.81)
RDW: 12.8 % (ref 11.5–15.5)
WBC: 7.4 10*3/uL (ref 4.0–10.5)

## 2014-11-21 LAB — GLUCOSE, CAPILLARY
GLUCOSE-CAPILLARY: 262 mg/dL — AB (ref 70–99)
GLUCOSE-CAPILLARY: 252 mg/dL — AB (ref 70–99)
Glucose-Capillary: 129 mg/dL — ABNORMAL HIGH (ref 70–99)
Glucose-Capillary: 221 mg/dL — ABNORMAL HIGH (ref 70–99)

## 2014-11-21 MED ORDER — SODIUM CHLORIDE 0.9 % IV SOLN
INTRAVENOUS | Status: DC
  Administered 2014-11-22 – 2014-12-04 (×8): via INTRAVENOUS
  Filled 2014-11-21: qty 1000

## 2014-11-21 MED ORDER — SODIUM CHLORIDE 0.9 % IV SOLN
INTRAVENOUS | Status: DC
Start: 2014-11-21 — End: 2014-11-27
  Administered 2014-11-21: 1000 mL via INTRAVENOUS
  Administered 2014-11-22: 19:00:00 via INTRAVENOUS
  Administered 2014-11-23: 1000 mL via INTRAVENOUS
  Administered 2014-11-25: 18:00:00 via INTRAVENOUS

## 2014-11-21 NOTE — Care Management Note (Signed)
In[facility] Individual Statement of Services  Patient Name:  Chad Fuller  Date:  11/21/2014  Welcome to the [facility].  Our goal is to provide you with an individualized program based on your diagnosis and situation, designed to meet your specific needs.  With this comprehensive rehabilitation program, you will be expected to participate in at least 3 hours of rehabilitation therapies Monday-Friday, with modified therapy programming on the weekends.  Your rehabilitation program will include the following services:  Physical Therapy (PT), Occupational Therapy (OT), Speech Therapy (ST), 24 hour per day rehabilitation nursing, Case Management (Social Worker), Rehabilitation Medicine, Nutrition Services and Pharmacy Services  Weekly team conferences will be held on Wednesday to discuss your progress.  Your Social Worker will talk with you frequently to get your input and to update you on team discussions.  Team conferences with you and your family in attendance may also be held.  Expected length of stay: 2-3 weeks  Overall anticipated outcome: supervision/min level  Depending on your progress and recovery, your program may change. Your Social Worker will coordinate services and will keep you informed of any changes. Your Social Worker's name and contact numbers are listed  below.  The following services may also be recommended but are not provided by the [facility] will be made to provide these services after discharge if needed.  Arrangements include referral to agencies that provide these services.  Your insurance has been verified to be:  [employer] Your primary doctor is:  Radio broadcast assistant  Pertinent information will be shared with your doctor and your insurance company.  Social Worker:  Ovidio Kin, [place] or ([phone]  Information discussed with and copy given to patient by: Elease Hashimoto, 11/21/2014, 2:11 PM

## 2014-11-21 NOTE — Progress Notes (Signed)
80 year old male with history of DM type 2 with neuropathy, PE, NSCLC s/p resection, CAD who was admitted on 11/18/14 with slurred speech, dizziness and difficulty walking with fall. CT head without acute changes, generalized atrophy and right forehead contusion. CT cervical spine with stenosis C3/4 to C 5/6. MRI/MRA brain with Small acute brainstem infarct in the left paracentral pons and no significant stenosis.  2D echo with EF 60%, AV sclerosis and mitral annular calcification without stenosis. Carotid dopplers without significant stenosis. Neurology recommended ASA for thrombotic stroke due to SVD. Speech therapy evaluation with dysphagia and FEES done  Subjective/Complaints:   Objective: Vital Signs: Blood pressure 128/70, pulse 84, temperature 98.3 F (36.8 C), temperature source Oral, resp. rate 18, SpO2 98 %. Mri Brain Without Contrast  11/19/2014   CLINICAL DATA:  80 year old male with slurred speech and abnormal gait. Recent dizziness and fall with forehead injury. Initial encounter.  EXAM: MRI HEAD WITHOUT CONTRAST  MRA HEAD WITHOUT CONTRAST  TECHNIQUE: Multiplanar, multiecho pulse sequences of the brain and surrounding structures were obtained without intravenous contrast. Angiographic images of the head were obtained using MRA technique without contrast.  COMPARISON:  Head and cervical spine CT 11/18/2014.  FINDINGS: MRI HEAD FINDINGS  Confluent 15 mm area of restricted diffusion in the left paracentral pons. Associated T2 and FLAIR hyperintensity. No mass effect or hemorrhage. Major intracranial vascular flow voids are preserved, there is generalized dolichoectasia of the vertebrobasilar system, resulting in some mass effect on the medulla (series 7, image 6).  No other restricted diffusion. No midline shift, mass effect, evidence of mass lesion, ventriculomegaly, extra-axial collection or acute intracranial hemorrhage. Cervicomedullary junction and pituitary are within normal limits.  Multilevel degenerative changes in the cervical spine with degenerative spinal stenosis. Normal for age supratentorial gray and white matter signal.  Normal bone marrow signal. Postoperative changes to the globes. Visualized paranasal sinuses and mastoids are clear. Visible internal auditory structures appear normal. Visualized scalp soft tissues are within normal limits.  MRA HEAD FINDINGS  Antegrade flow in the posterior circulation. Dominant distal right vertebral artery. Bilateral distal vertebral artery and vertebrobasilar junction tortuosity. Dolichoectatic proximal basilar artery. Mild mass effect on the lower brainstem due to tortuous vessels. No stenosis. Normal SCA and PCA origins. Tortuous proximal PCAs. Normal right posterior communicating artery, the left is diminutive or absent. Bilateral PCA branches are within normal limits.  Antegrade flow in both ICA siphons. Normal ophthalmic and right posterior communicating artery origins. No siphon stenosis. Patent carotid termini. Normal MCA and ACA origins. Tortuous proximal ACA is. Anterior communicating artery within normal limits. Azygos type proximal ACA anatomy. Visualized bilateral ACA branches are tortuous but within normal limits. Tortuous proximal MCAs. Visualized bilateral MCA branches are within normal limits, some tortuous.  IMPRESSION: 1. Small acute brainstem infarct in the left paracentral pons (pontine perforated her artery territory). No mass effect or hemorrhage. 2. Intracranial artery dolichoectasia, otherwise negative intracranial MRA. 3. Otherwise normal for age non contrast brain MRI.   Electronically Signed   By: Lars Pinks M.D.   On: 11/19/2014 09:33   Mr Jodene Nam Head/brain Wo Cm  11/19/2014   CLINICAL DATA:  80 year old male with slurred speech and abnormal gait. Recent dizziness and fall with forehead injury. Initial encounter.  EXAM: MRI HEAD WITHOUT CONTRAST  MRA HEAD WITHOUT CONTRAST  TECHNIQUE: Multiplanar, multiecho pulse  sequences of the brain and surrounding structures were obtained without intravenous contrast. Angiographic images of the head were obtained using MRA technique without contrast.  COMPARISON:  Head and cervical spine CT 11/18/2014.  FINDINGS: MRI HEAD FINDINGS  Confluent 15 mm area of restricted diffusion in the left paracentral pons. Associated T2 and FLAIR hyperintensity. No mass effect or hemorrhage. Major intracranial vascular flow voids are preserved, there is generalized dolichoectasia of the vertebrobasilar system, resulting in some mass effect on the medulla (series 7, image 6).  No other restricted diffusion. No midline shift, mass effect, evidence of mass lesion, ventriculomegaly, extra-axial collection or acute intracranial hemorrhage. Cervicomedullary junction and pituitary are within normal limits. Multilevel degenerative changes in the cervical spine with degenerative spinal stenosis. Normal for age supratentorial gray and white matter signal.  Normal bone marrow signal. Postoperative changes to the globes. Visualized paranasal sinuses and mastoids are clear. Visible internal auditory structures appear normal. Visualized scalp soft tissues are within normal limits.  MRA HEAD FINDINGS  Antegrade flow in the posterior circulation. Dominant distal right vertebral artery. Bilateral distal vertebral artery and vertebrobasilar junction tortuosity. Dolichoectatic proximal basilar artery. Mild mass effect on the lower brainstem due to tortuous vessels. No stenosis. Normal SCA and PCA origins. Tortuous proximal PCAs. Normal right posterior communicating artery, the left is diminutive or absent. Bilateral PCA branches are within normal limits.  Antegrade flow in both ICA siphons. Normal ophthalmic and right posterior communicating artery origins. No siphon stenosis. Patent carotid termini. Normal MCA and ACA origins. Tortuous proximal ACA is. Anterior communicating artery within normal limits. Azygos type  proximal ACA anatomy. Visualized bilateral ACA branches are tortuous but within normal limits. Tortuous proximal MCAs. Visualized bilateral MCA branches are within normal limits, some tortuous.  IMPRESSION: 1. Small acute brainstem infarct in the left paracentral pons (pontine perforated her artery territory). No mass effect or hemorrhage. 2. Intracranial artery dolichoectasia, otherwise negative intracranial MRA. 3. Otherwise normal for age non contrast brain MRI.   Electronically Signed   By: Lars Pinks M.D.   On: 11/19/2014 09:33   Results for orders placed or performed during the hospital encounter of 11/20/14 (from the past 72 hour(s))  Glucose, capillary     Status: Abnormal   Collection Time: 11/20/14  8:55 PM  Result Value Ref Range   Glucose-Capillary 254 (H) 70 - 99 mg/dL  CBC WITH DIFFERENTIAL     Status: None   Collection Time: 11/21/14  6:30 AM  Result Value Ref Range   WBC 7.4 4.0 - 10.5 K/uL   RBC 4.49 4.22 - 5.81 MIL/uL   Hemoglobin 14.0 13.0 - 17.0 g/dL   HCT 41.1 39.0 - 52.0 %   MCV 91.5 78.0 - 100.0 fL   MCH 31.2 26.0 - 34.0 pg   MCHC 34.1 30.0 - 36.0 g/dL   RDW 12.8 11.5 - 15.5 %   Platelets 226 150 - 400 K/uL   Neutrophils Relative % 65 43 - 77 %   Neutro Abs 4.8 1.7 - 7.7 K/uL   Lymphocytes Relative 24 12 - 46 %   Lymphs Abs 1.8 0.7 - 4.0 K/uL   Monocytes Relative 8 3 - 12 %   Monocytes Absolute 0.6 0.1 - 1.0 K/uL   Eosinophils Relative 3 0 - 5 %   Eosinophils Absolute 0.2 0.0 - 0.7 K/uL   Basophils Relative 0 0 - 1 %   Basophils Absolute 0.0 0.0 - 0.1 K/uL  Comprehensive metabolic panel     Status: Abnormal   Collection Time: 11/21/14  6:30 AM  Result Value Ref Range   Sodium 140 137 - 147 mEq/L   Potassium  4.1 3.7 - 5.3 mEq/L   Chloride 101 96 - 112 mEq/L   CO2 27 19 - 32 mEq/L   Glucose, Bld 137 (H) 70 - 99 mg/dL   BUN 17 6 - 23 mg/dL   Creatinine, Ser 0.65 0.50 - 1.35 mg/dL   Calcium 9.1 8.4 - 10.5 mg/dL   Total Protein 6.5 6.0 - 8.3 g/dL   Albumin  3.6 3.5 - 5.2 g/dL   AST 17 0 - 37 U/L   ALT 13 0 - 53 U/L   Alkaline Phosphatase 101 39 - 117 U/L   Total Bilirubin 0.7 0.3 - 1.2 mg/dL   GFR calc non Af Amer 86 (L) >90 mL/min   GFR calc Af Amer >90 >90 mL/min    Comment: (NOTE) The eGFR has been calculated using the CKD EPI equation. This calculation has not been validated in all clinical situations. eGFR's persistently <90 mL/min signify possible Chronic Kidney Disease.    Anion gap 12 5 - 15  Glucose, capillary     Status: Abnormal   Collection Time: 11/21/14  6:47 AM  Result Value Ref Range   Glucose-Capillary 129 (H) 70 - 99 mg/dL   Comment 1 Notify RN      HEENT: normal Cardio: RRR and no murmurs Resp: CTA B/L and unlabored GI: BS positive and NT,ND Extremity:  Pulses positive and No Edema Skin:   Intact and Wound R forehead ecchymosis, faint, non tender Neuro: Alert/Oriented, Abnormal Sensory decreased LT sensation bilateral feet, Abnormal Motor 3- R delt, bi, tri, grip, HF, KE, ADF and Abnormal FMC Ataxic/ dec FMC Musc/Skel:  Other tightness bilateral hamstrings Gen NAD   Assessment/Plan: 1. 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 Physiatrist is providing close team supervision and 24 hour management of active medical problems listed below. Physiatrist and rehab team continue to assess barriers to discharge/monitor patient progress toward functional and medical goals. FIM:                   Comprehension Comprehension Mode: Auditory Comprehension: 5-Follows basic conversation/direction: With no assist  Expression Expression Mode: Verbal Expression: 3-Expresses basic 50 - 74% of the time/requires cueing 25 - 50% of the time. Needs to repeat parts of sentences.  Social Interaction Social Interaction: 5-Interacts appropriately 90% of the time - Needs  monitoring or encouragement for participation or interaction.  Problem Solving Problem Solving Mode: Not assessed  Memory Memory: 5-Recognizes or recalls 90% of the time/requires cueing < 10% of the time  Medical Problem List and Plan: 1. Functional deficits secondary to Left paracentral pontine stroke 2.  DVT Prophylaxis/Anticoagulation: Pharmaceutical: Lovenox 3. Pain Management: Tylenol prn for back pain. Will add K pad additionally.   4. Mood: LCSW to follow for evaluation and support.   5. Neuropsych: This patient is capable of making decisions on his own behalf. 6. Skin/Wound Care: Routine pressure relief measures. Rehab RN to monitor skin daily 7. Fluids/Electrolytes/Nutrition: Monitor I/O. Offer nutritional supplement prn if intake poor.   8. DM type 2 with neuropathy: Will monitor BS with ac/hs checks. Continue lantus and resume metformin as blood sugars poorly controlled.  9.  BPH: Continue proscar. Monitor for any voiding difficulty.    10.  HTN: Will monitor blood pressure tid. Currently controlled off HCTZ.   11. GERD: will add Pepcid for symptoms.    LOS (Days) 1 A FACE  TO FACE EVALUATION WAS PERFORMED  Charlett Blake 11/21/2014, 8:39 AM

## 2014-11-21 NOTE — Evaluation (Signed)
Occupational Therapy Assessment and Plan  Patient Details  Name: Chad Fuller MRN: [id number] Date of Birth: 18-Mar-1926  OT Diagnosis: abnormal posture, ataxia, cognitive deficits and muscle weakness (generalized) Rehab Potential: Rehab Potential (ACUTE ONLY): Good ELOS: 2-3 weeks   Today's Date: 11/21/2014 OT Individual Time: 1005-1110 OT Individual Time Calculation (min): 65 min     Problem List:  Patient Active Problem List   Diagnosis Date Noted  . Left pontine CVA 11/20/2014  . Dysphagia, post-stroke 11/20/2014  . TIA (transient ischemic attack) 11/19/2014  . Type 2 diabetes mellitus 11/19/2014  . Aortic heart murmur 11/19/2014  . Non-small cell carcinoma of lung 11/19/2014  . Recurrent falls 11/19/2014  . Dizziness and giddiness 11/19/2014  . CVA (cerebral infarction) 11/19/2014  . Right hemiparesis   . Type II or unspecified type diabetes mellitus with neurological manifestations, not stated as uncontrolled 11/14/2013  . Pain 11/14/2013  . Closed fracture of metatarsal bone(s) 10/10/2013  . ASTHMA 02/23/2009  . Coronary atherosclerosis 02/05/2009  . NEOPLASM, MALIGNANT, LUNG 02/04/2009  . DIABETES, TYPE 2 02/04/2009  . PULMONARY EMBOLISM 02/04/2009  . C O P D 02/04/2009  . DYSPNEA 02/04/2009    Past Medical History:  Past Medical History  Diagnosis Date  . Diabetes mellitus   . Cancer     lung  . Prostate enlargement   . History of blood clots   . CAD (coronary artery disease)   . Pulmonary embolus july 2005   Past Surgical History:  Past Surgical History  Procedure Laterality Date  . Lung removal, partial    . Coronary stent placement    . Knee surgery    . Filtering procedure      reports filter placed after knee surgery for blood clots  . I&d extremity  08/16/2012    Procedure: MINOR IRRIGATION AND DEBRIDEMENT EXTREMITY;  Surgeon: Tennis Must, MD;  Location: [facility];  Service: Orthopedics;  Laterality: Right;    Assessment &  Plan Clinical Impression: Chad Fuller is an 80 year old male with history of DM type 2 with neuropathy, PE, NSCLC s/p resection, CAD who was admitted on 11/18/14 with slurred speech, dizziness and difficulty walking with fall. CT head without acute changes, generalized atrophy and right forehead contusion. CT cervical spine with stenosis C3/4 to C 5/6. MRI/MRA brain with Small acute brainstem infarct in the left paracentral pons and no significant stenosis.  2D echo with EF 60%, AV sclerosis and mitral annular calcification without stenosis. Carotid dopplers without significant stenosis. Neurology recommended ASA for thrombotic stroke due to SVD. Speech therapy evaluation with dysphagia and FEES done today and patient to continue on Dysphagia 3, nectar liquids by spoon.  Patient with resultant ataxia, right sided weakness with RLE instability, dysphagia as well as apraxia. Therapy ongoing and CIR recommended for follow up therapy.  Pt was screened by the rehab team who felt he could benefit from our inpatient program.  Patient transferred to [facility] on 11/20/2014 .    Patient currently requires mod with basic self-care skills secondary to muscle weakness, ataxia and decreased coordination, decreased memory and decreased sitting balance, decreased standing balance and decreased postural control.  Prior to hospitalization, patient was independent, working, driving, living alone.  Patient will benefit from skilled intervention to increase independence with basic self-care skills and increase level of independence with iADL prior to discharge home with care partner.  Anticipate patient will require 24 hour supervision and follow up home health.  OT -  End of Session Endurance Deficit: No OT Assessment Rehab Potential (ACUTE ONLY): Good OT Patient demonstrates impairments in the following area(s): Balance;Cognition;Motor;Sensory OT Basic ADL's Functional Problem(s): Bathing;Dressing;Toileting OT Advanced  ADL's Functional Problem(s): Simple Meal Preparation;Light Housekeeping OT Transfers Functional Problem(s): Toilet;Tub/Shower OT Additional Impairment(s): None OT Plan OT Intensity: Minimum of 1-2 x/day, 45 to 90 minutes OT Frequency: 5 out of 7 days OT Duration/Estimated Length of Stay: 2-3 weeks OT Treatment/Interventions: Balance/vestibular training;Cognitive remediation/compensation;Discharge planning;DME/adaptive equipment instruction;Functional mobility training;Patient/family education;Self Care/advanced ADL retraining;UE/LE Strength taining/ROM;UE/LE Coordination activities;Therapeutic Exercise;Therapeutic Activities;Neuromuscular re-education OT Self Feeding Anticipated Outcome(s): I OT Basic Self-Care Anticipated Outcome(s): supervision OT Toileting Anticipated Outcome(s): supervision OT Bathroom Transfers Anticipated Outcome(s): supervision OT Recommendation Patient destination: Home Follow Up Recommendations: Home health OT Equipment Recommended: Tub/shower bench   Skilled Therapeutic Intervention Pt seen for initial evaluation and ADL retraining to include shower and dressing. Pt needed mod A to support balance in standing due to trunkal ataxia. Pt has excellent initiation, but decreased awareness of his balance and RLE deficits. Pt tolerated therapy well and discussed his OT goals and plans.    OT Evaluation Precautions/Restrictions  Precautions Precautions: Fall Precaution Comments: multiple falls at home Restrictions Weight Bearing Restrictions: No   Pain Pain Assessment Pain Assessment: No/denies pain Home Living/Prior Functioning Home Living Available Help at Discharge: Family Type of Home: House Home Access: Stairs to enter Technical brewer of Steps: 4 Entrance Stairs-Rails: Right Home Layout: One level  Lives With: Alone Prior Function Level of Independence: Independent with basic ADLs, Independent with homemaking with ambulation  Able to Take  Stairs?: Yes (not easily, had difficulty stepping R foot up) Driving: Yes Vocation: Part time employment ADL ADL ADL Comments: Refer to FIM Vision/Perception  Vision- History Baseline Vision/History: Wears glasses Wears Glasses: At all times Patient Visual Report: No change from baseline Vision- Assessment Eye Alignment: Within Functional Limits Ocular Range of Motion: Within Functional Limits Perception Comments: WFL  Cognition Overall Cognitive Status: Within Functional Limits for tasks assessed Orientation Level: Oriented X4 Memory: Impaired Memory Impairment: Decreased recall of new information Sensation Sensation Light Touch: Impaired Detail Light Touch Impaired Details: Absent RLE;Impaired LLE (absent R great toe ) Stereognosis: Appears Intact Hot/Cold: Appears Intact Proprioception: Appears Intact (bil knees and ankles, but pt states he can't tell where he has placed his foot) Coordination Gross Motor Movements are Fluid and Coordinated: No (RLE) Fine Motor Movements are Fluid and Coordinated: Yes Heel Shin Test: decreased speed, exursion RLE Motor  Motor Motor: Ataxia;Abnormal postural alignment and control Motor - Skilled Clinical Observations: decreased trunk control in standing Mobility    refer to FIM Trunk/Postural Assessment  Cervical Assessment Cervical Assessment: Exceptions to [facility] (forward head) Thoracic Assessment Thoracic Assessment: Exceptions to [facility] (kyphotic) Lumbar Assessment Lumbar Assessment: Exceptions to [facility] (increased wt bearing L) Postural Control Postural Control: Deficits on evaluation Trunk Control: leans L with poor awareness Righting Reactions: delayed; needed cues to correct mildine  Balance Dynamic Sitting Balance Dynamic Sitting - Level of Assistance: 4: Min assist Static Standing Balance Static Standing - Level of Assistance: 4: Min assist Dynamic Standing Balance Dynamic Standing - Level of Assistance: 2: Max  assist Extremity/Trunk Assessment RUE Assessment RUE Assessment: Within Functional Limits (4-/5 overall strength) LUE Assessment LUE Assessment: Within Functional Limits  FIM:  FIM - Grooming Grooming Steps: Wash, rinse, dry face;Wash, rinse, dry hands;Brush, comb hair Grooming: 5: Set-up assist to open containers FIM - Bathing Bathing Steps Patient Completed: Chest;Right Arm;Left Arm;Abdomen;Left upper leg;Right upper leg;Buttocks;Front perineal area;Left  lower leg (including foot) Bathing: 4: Min-Patient completes 8-9 5f10 parts or 75+ percent FIM - Upper Body Dressing/Undressing Upper body dressing/undressing steps patient completed: Thread/unthread right sleeve of pullover shirt/dresss;Thread/unthread left sleeve of pullover shirt/dress;Put head through opening of pull over shirt/dress;Pull shirt over trunk Upper body dressing/undressing: 5: Set-up assist to: Obtain clothing/put away FIM - Lower Body Dressing/Undressing Lower body dressing/undressing steps patient completed: Thread/unthread left pants leg;Pull pants up/down;Don/Doff left sock;Don/Doff left shoe;Fasten/unfasten left shoe Lower body dressing/undressing: 3: Mod-Patient completed 50-74% of tasks FIM - Toileting Toileting steps completed by patient: Performs perineal hygiene;Adjust clothing after toileting;Adjust clothing prior to toileting Toileting: 4: Steadying assist FIM - TAir cabin crewTransfers Assistive Devices: WInsurance account managerTransfers: 4-From toilet/BSC: Min A (steadying Pt. > 75%);4-To toilet/BSC: Min A (steadying Pt. > 75%) FIM - Tub/Shower Transfers Tub/Shower Assistive Devices: Tub transfer bench;Walk in shower;Grab bars;Walker Tub/shower Transfers: 4-Into Tub/Shower: Min A (steadying Pt. > 75%/lift 1 leg);4-Out of Tub/Shower: Min A (steadying Pt. > 75%/lift 1 leg)   Refer to Care Plan for Long Term Goals  Recommendations for other services: None  Discharge Criteria: Patient will be discharged  from OT if patient refuses treatment 3 consecutive times without medical reason, if treatment goals not met, if there is a change in medical status, if patient makes no progress towards goals or if patient is discharged from hospital.  The above assessment, treatment plan, treatment alternatives and goals were discussed and mutually agreed upon: by patient  [facility]11/25/2015, 12:20 PM

## 2014-11-21 NOTE — Progress Notes (Signed)
Inpatient Diabetes Program Recommendations  AACE/ADA: New Consensus Statement on Inpatient Glycemic Control (2013)  Target Ranges:  Prepandial:   less than 140 mg/dL      Peak postprandial:   less than 180 mg/dL (1-2 hours)      Critically ill patients:  140 - 180 mg/dL   Results for JUVENCIO, VERDI (MRN [id number]) as of 11/21/2014 09:56  Ref. Range 11/20/2014 06:35 11/20/2014 11:42 11/20/2014 16:45 11/20/2014 20:55 11/21/2014 06:47  Glucose-Capillary Latest Range: 70-99 mg/dL 148 (H) 197 (H) 288 (H) 254 (H) 129 (H)  Results for TRAMON, CRESCENZO (MRN [id number]) as of 11/21/2014 09:56  Ref. Range 11/19/2014 07:02 11/19/2014 11:48 11/19/2014 16:28 11/19/2014 21:40  Glucose-Capillary Latest Range: 70-99 mg/dL 212 (H) 274 (H) 239 (H) 232 (H)   Diabetes history: DM2 Outpatient Diabetes medications: Tradjenta 5 mg daily, Metformin 500 mg QAM, Glipizide 10 mg daily, Lantus 50 units QAM Current orders for Inpatient glycemic control: Lantus 70 units daily, Novolog 0-15 units AC, Metformin 500 mg BID  Inpatient Diabetes Program Recommendations Insulin - Meal Coverage: Noted post prandial glucose is consistently elevated. Please consider ordering Novolog 3 units TID with meals for meal coverage (in addition to Novolog correction).  Thanks, Barnie Alderman, RN, MSN, CCRN, CDE Diabetes Coordinator Inpatient Diabetes Program [phone] (Team Pager) [phone] ([facility] office) [phone] [facility] office)

## 2014-11-21 NOTE — Evaluation (Signed)
Physical Therapy Assessment and Plan  Patient Details  Name: Chad Fuller MRN: [id number] Date of Birth: 10/08/1926  PT Diagnosis: Abnormal posture, Abnormality of gait, Ataxia, Cognitive deficits, Hemiparesis dominant, Impaired sensation, Low back pain and Muscle weakness Rehab Potential: Good ELOS: 14-17 days   Today's Date: 11/21/2014 PT Individual Time: 0830-0930 PT Individual Time Calculation (min): 60 min    Problem List:  Patient Active Problem List   Diagnosis Date Noted  . Left pontine CVA 11/20/2014  . Dysphagia, post-stroke 11/20/2014  . TIA (transient ischemic attack) 11/19/2014  . Type 2 diabetes mellitus 11/19/2014  . Aortic heart murmur 11/19/2014  . Non-small cell carcinoma of lung 11/19/2014  . Recurrent falls 11/19/2014  . Dizziness and giddiness 11/19/2014  . CVA (cerebral infarction) 11/19/2014  . Right hemiparesis   . Type II or unspecified type diabetes mellitus with neurological manifestations, not stated as uncontrolled 11/14/2013  . Pain 11/14/2013  . Closed fracture of metatarsal bone(s) 10/10/2013  . ASTHMA 02/23/2009  . Coronary atherosclerosis 02/05/2009  . NEOPLASM, MALIGNANT, LUNG 02/04/2009  . DIABETES, TYPE 2 02/04/2009  . PULMONARY EMBOLISM 02/04/2009  . C O P D 02/04/2009  . DYSPNEA 02/04/2009    Past Medical History:  Past Medical History  Diagnosis Date  . Diabetes mellitus   . Cancer     lung  . Prostate enlargement   . History of blood clots   . CAD (coronary artery disease)   . Pulmonary embolus july 2005   Past Surgical History:  Past Surgical History  Procedure Laterality Date  . Lung removal, partial    . Coronary stent placement    . Knee surgery    . Filtering procedure      reports filter placed after knee surgery for blood clots  . I&d extremity  08/16/2012    Procedure: MINOR IRRIGATION AND DEBRIDEMENT EXTREMITY;  Surgeon: Tennis Must, MD;  Location: [place];  Service: Orthopedics;   Laterality: Right;    Assessment & Plan SAJAD GLANDER is an 80 year old male with history of DM type 2 with neuropathy, PE, NSCLC s/p resection, CAD who was admitted on 11/18/14 with slurred speech, dizziness and difficulty walking with fall. CT head without acute changes, generalized atrophy and right forehead contusion. CT cervical spine with stenosis C3/4 to C 5/6. MRI/MRA brain with Small acute brainstem infarct in the left paracentral pons and no significant stenosis. 2D echo with EF 60%, AV sclerosis and mitral annular calcification without stenosis. Carotid dopplers without significant stenosis. Neurology recommended ASA for thrombotic stroke due to SVD. Speech therapy evaluation with dysphagia and FEES done today and patient to continue on Dysphagia 3, nectar liquids by spoon. Patient with resultant ataxia, right sided weakness with RLE instability, dysphagia as well as apraxia.  Patient transferred to CIR on 11/20/2014 .   Patient currently requires total +2 with mobility secondary to muscle weakness and muscle joint tightness, impaired timing and sequencing, unbalanced muscle activation, ataxia, decreased coordination and decreased motor planning and decreased awareness, decreased problem solving, decreased safety awareness and decreased memory.  Prior to hospitalization, patient was modified independent  with mobility and lived with Alone in a House home.  Home access is 4Stairs to enter, R rail.  Patient will benefit from skilled PT intervention to maximize safe functional mobility, minimize fall risk and decrease caregiver burden for planned discharge home with 24 hour supervision.  Anticipate patient will benefit from follow up [place] at discharge.  PT -  End of Session Activity Tolerance: Tolerates < 10 min activity, no significant change in vital signs Endurance Deficit: Yes Endurance Deficit Description: fatigued and uncomfortable after sitting up x 15 min PT Assessment Rehab Potential  (ACUTE/IP ONLY): Good Barriers to Discharge: Decreased caregiver support Barriers to Discharge Comments: pt lives alone; 4 STE with hx of falling on stairs PT Patient demonstrates impairments in the following area(s): Balance;Endurance;Motor;Safety;Sensory PT Transfers Functional Problem(s): Bed Mobility;Bed to Chair;Car;Furniture;Floor PT Locomotion Functional Problem(s): Ambulation;Wheelchair Mobility;Stairs PT Plan PT Intensity: Minimum of 1-2 x/day ,45 to 90 minutes PT Frequency: 5 out of 7 days PT Duration Estimated Length of Stay: 14-17 days PT Treatment/Interventions: Ambulation/gait training;Balance/vestibular training;Cognitive remediation/compensation;Community reintegration;DME/adaptive equipment instruction;Discharge planning;Functional mobility training;Neuromuscular re-education;Patient/family education;Stair training;Splinting/orthotics;Psychosocial support;Therapeutic Activities;Therapeutic Exercise;UE/LE Strength taining/ROM;UE/LE Coordination activities;Wheelchair propulsion/positioning PT Transfers Anticipated Outcome(s): supervision for basic , floor and car PT Locomotion Anticipated Outcome(s): supervision w/c propulsion x 150',  gait x 150', up/down 5 steps 1 rail PT Recommendation Recommendations for Other Services: Neuropsych consult Follow Up Recommendations: Home health PT Patient destination: Home Equipment Recommended: To be determined Equipment Details: per pt, he owns [employer] and [facility]  Skilled Therapeutic Intervention: Therapeutic activity: unsupported sitting during reaching activity in R and L directions.  Pt with LOB to L, and drifting L at rest.  Pt able to correct posture when cued that he was leaning.  Pt reported back pain from extended sitting; postural limitations appear long standing.  Pt somewhat labile when asked about his living situation, reporting that his wife died 8 years ago and his adult children help him out now.  Pt was still working up to 7 days a  week part-time.  Pt was internally distracted and tangentially conversational about his daughter and grandson. Pt asked when he would be able to go to [place] by himself; PT explained pt's high risk for falls at this time. Pt with very poor insight about his deficits. Quick release belt applied and all needs left in place.   PT Evaluation Precautions/Restrictions Precautions Precautions: Fall Precaution Comments: multiple falls at home; stated he falls up or down entry steps at home about once per month, due to "not knowing where his feet are" Restrictions Weight Bearing Restrictions: No General   Vital SignsTherapy Vitals Temp: 97.2 F (36.2 C) Temp Source: Oral Pulse Rate: 94 Resp: 18 BP: 136/68 mmHg Patient Position (if appropriate): Sitting Oxygen Therapy SpO2: 96 % O2 Device: Not Delivered Pain Pain Assessment Pain Assessment: No/denies pain Home Living/Prior Functioning Home Living Living Arrangements: Alone Available Help at Discharge: Family;Available 24 hours/day Type of Home: House Home Access: Stairs to enter [employer] of Steps: 4 Entrance Stairs-Rails: Right Home Layout: One level  Lives With: Alone Prior Function Level of Independence: Independent with basic ADLs;Independent with homemaking with ambulation  Able to Take Stairs?: Yes (not easily, had difficulty stepping R foot up) Driving: Yes Vocation: Part time employment Vision/Perception - no change from baseline, per pt     Cognition Overall Cognitive Status: Impaired/Different from baseline Orientation Level: Oriented X4 Attention: Selective Selective Attention: Impaired Selective Attention Impairment: Verbal basic;Functional basic Memory: Impaired Memory Impairment: Decreased recall of new information;Decreased short term memory Decreased Short Term Memory: Verbal basic Awareness: Impaired Awareness Impairment: Emergent impairment Problem Solving: Impaired Problem Solving Impairment:  Functional basic Behaviors: Lability Safety/Judgment: Impaired Sensation Sensation Light Touch: Impaired Detail Light Touch Impaired Details: Absent RLE;Impaired LLE (absent R great toe ) Proprioception: Appears Intact (bil knees and ankles, but pt states he can't tell where he has  placed his foot) Coordination Gross Motor Movements are Fluid and Coordinated: No Fine Motor Movements are Fluid and Coordinated: No Heel Shin Test: decreased speed, exursion RLE Motor  Motor Motor: Hemiplegia;Ataxia;Abnormal postural alignment and control Motor - Skilled Clinical Observations: decreased trunk control in sitting and standing; mild truncal ataxia  Mobility Bed Mobility Bed Mobility: Not assessed Transfers Transfers: Yes Stand Pivot Transfers: 3: Mod assist;2: Max assist (to L with mod assist; to R max assist) Stand Pivot Transfer Details: Verbal cues for technique;Manual facilitation for weight shifting Stand Pivot Transfer Details (indicate cue type and reason): poor eccentric control and poor safety awareness during stand> sit Locomotion  Ambulation Ambulation: Yes Ambulation/Gait Assistance: 1: +2 Total assist Ambulation Distance (Feet): 10 Feet Ambulation/Gait Assistance Details: Manual facilitation for weight shifting Ambulation/Gait Assistance Details: LOB to R, later to L within 10' Gait Gait: Yes Gait Pattern: Impaired Gait Pattern: Trunk flexed;Lateral trunk lean to right;Lateral trunk lean to left;Narrow base of support;Decreased hip/knee flexion - right;Step-through pattern;Step-to pattern;Decreased dorsiflexion - right Gait velocity: decreased Stairs / Additional Locomotion Stairs: Yes Stairs Assistance: 4: Min assist Stairs Assistance Details: Verbal cues for precautions/safety;Verbal cues for technique;Verbal cues for gait pattern Stairs Assistance Details (indicate cue type and reason): pt unable to remember instructions and demo for sequencing for 20 seconds;  impulsive  Stair Management Technique: Two rails;Alternating pattern;Backwards;Forwards (descended backwards, ascended forwards) Number of Stairs: 3 Height of Stairs: 5 Wheelchair Mobility Wheelchair Mobility: Yes Wheelchair Assistance: 6: Modified independent (Device/Increase time);4: Advertising account executive Details: Verbal cues for Marketing executive: Both upper extremities Wheelchair Parts Management: Needs assistance Distance: 25  Trunk/Postural Assessment  Cervical Assessment Cervical Assessment: Exceptions to [facility] (forward head) Thoracic Assessment Thoracic Assessment: Exceptions to [facility] (kyphotic) Lumbar Assessment Lumbar Assessment: Exceptions to [facility] (increased wt bearing L) Postural Control Postural Control: Deficits on evaluation Trunk Control: leans L with poor awareness Righting Reactions: delayed; needed cues to correct mildine Postural Limitations: back pain with unsupported sitting x 15 minutes  Balance Balance Balance Assessed: Yes Dynamic Sitting Balance Dynamic Sitting - Level of Assistance: 5: Stand by assistance Static Standing Balance Static Standing - Level of Assistance: 4: Min assist Dynamic Standing Balance Dynamic Standing - Level of Assistance: 1: +2 Total assist Extremity Assessment      RLE Assessment RLE Assessment: Exceptions to [facility] (heel cord and hamstrings tight) RLE Strength RLE Overall Strength Comments: grossly in sitting: 3-/5 hip flexion, knee ext, ankle DF LLE Assessment LLE Assessment: Exceptions to [facility] (tight hamstrings and heel cord) LLE Strength LLE Overall Strength Comments: grossly in sitting; 4+/5 knee; 4/5 hip and ankle DF  FIM:  FIM - Bed/Chair Transfer Bed/Chair Transfer Assistive Devices: Arm rests Bed/Chair Transfer: 2: Bed > Chair or W/C: Max A (lift and lower assist);3: Chair or W/C > Bed: Mod A (lift or lower assist) FIM - Locomotion: Wheelchair Distance: 25 Locomotion: Wheelchair: 1: Travels  less than 50 ft with minimal assistance (Pt.>75%) FIM - Locomotion: Ambulation Locomotion: Ambulation Assistive Devices: Other (comment) (none) Ambulation/Gait Assistance: 1: +2 Total assist Locomotion: Ambulation: 1: Two helpers FIM - Locomotion: Stairs Locomotion: Scientist, physiological: Insurance account manager - 2 Locomotion: Stairs: 1: Up and Down < 4 stairs with minimal assistance (Pt.>75%)   Refer to Care Plan for Long Term Goals  Recommendations for other services: Neuropsych  Discharge Criteria: Patient will be discharged from PT if patient refuses treatment 3 consecutive times without medical reason, if treatment goals not met, if there is a change in medical status,  if patient makes no progress towards goals or if patient is discharged from hospital.  The above assessment, treatment plan, treatment alternatives and goals were discussed and mutually agreed upon: by patient  Oceania Noori 11/21/2014, 4:39 PM

## 2014-11-21 NOTE — Evaluation (Addendum)
Speech Language Pathology Assessment and Plan  Patient Details  Name: Chad Fuller MRN: [id number] Date of Birth: 1926/11/18  SLP Diagnosis: Dysarthria;Dysphagia;Cognitive Impairments  Rehab Potential:   ELOS: 14 days     Today's Date: 11/21/2014 SLP Individual Time: 7322-0254 SLP Individual Time Calculation (min): 62 min   Problem List:  Patient Active Problem List   Diagnosis Date Noted  . Left pontine CVA 11/20/2014  . Dysphagia, post-stroke 11/20/2014  . TIA (transient ischemic attack) 11/19/2014  . Type 2 diabetes mellitus 11/19/2014  . Aortic heart murmur 11/19/2014  . Non-small cell carcinoma of lung 11/19/2014  . Recurrent falls 11/19/2014  . Dizziness and giddiness 11/19/2014  . CVA (cerebral infarction) 11/19/2014  . Right hemiparesis   . Type II or unspecified type diabetes mellitus with neurological manifestations, not stated as uncontrolled 11/14/2013  . Pain 11/14/2013  . Closed fracture of metatarsal bone(s) 10/10/2013  . ASTHMA 02/23/2009  . Coronary atherosclerosis 02/05/2009  . NEOPLASM, MALIGNANT, LUNG 02/04/2009  . DIABETES, TYPE 2 02/04/2009  . PULMONARY EMBOLISM 02/04/2009  . C O P D 02/04/2009  . DYSPNEA 02/04/2009   Past Medical History:  Past Medical History  Diagnosis Date  . Diabetes mellitus   . Cancer     lung  . Prostate enlargement   . History of blood clots   . CAD (coronary artery disease)   . Pulmonary embolus july 2005   Past Surgical History:  Past Surgical History  Procedure Laterality Date  . Lung removal, partial    . Coronary stent placement    . Knee surgery    . Filtering procedure      reports filter placed after knee surgery for blood clots  . I&d extremity  08/16/2012    Procedure: MINOR IRRIGATION AND DEBRIDEMENT EXTREMITY;  Surgeon: Tennis Must, MD;  Location: [place];  Service: Orthopedics;  Laterality: Right;    Assessment / Plan / Recommendation Clinical Impression  Chad Fuller  is an 80 year old male with history of DM type 2 with neuropathy, PE, NSCLC s/p resection, CAD who was admitted on 11/18/14 with slurred speech, dizziness and difficulty walking with fall. CT head without acute changes, generalized atrophy and right forehead contusion. MRI/MRA brain with Small acute brainstem infarct in the left paracentral pons.  FEES completed 11/24 with recommendations for Dysphagia 3 solids, nectar liquids by spoon. Pt was screened by the rehab team who felt he could benefit from an inpatient program.  Pt admitted to [place] on 11/20/2014.  Speech evaluation completed on 11/21/2014 with the following results: Pt presents with s/s of a moderate oropharyngeal dysphagia characterized by multiple swallows and suspected delayed swallow initiation, resulting in immediate cough following trials of thin liquids.  No overt s/s of aspiration were noted with nectar thick liquids via teaspoon, pureed, or solid consistencies although pt exhibited prolonged mastication of solids with minimal oral residue.  Recommend that pt continue on dys 3 solids with nectar thick liquids via teaspoon.   Furthermore, pt presents with a moderate cognitive impairment characterized by decreased recall of new information, decreased selective attention, and decreased functional problem solving due to poor planning, thought organization and error awareness.  The abovementioned deficits impact his ability to safely return to his previous level of function including living independently, driving, working,and managing his own medications and finances.  In addition to the abovementioned cognitive deficits, pt also presents with a mild dysarthria due to right sided oral motor weakness resulting in  imprecise articulation of consonants in conversations.   Pt would benefit from skilled ST while inpatient in order to maximize functional independence and reduce burden of care.  Anticipate that pt will need 24/7 supervision at discharge as  well as assistance for medication and financial management.  Also suspect that pt will benefit from continued ST follow up at discharge, either in the home health or outpatient setting; however, ST will update discharge recommendations pending progress made while inpatient.     Skilled Therapeutic Interventions          Cognitive-linguistic and bedside swallowing evaluation completed with results and recommendations reviewed with patient and family.  Diet handouts were provided to facilitate carryover of swallowing precautions.     SLP Assessment  Patient will need skilled Speech Lanaguage Pathology Services during CIR admission    Recommendations  Recommended Consults: FEES Diet Recommendations: Dysphagia 3 (Mechanical Soft);Nectar-thick liquid Liquid Administration via: Spoon Medication Administration: Whole meds with puree Supervision: Patient able to self feed;Full supervision/cueing for compensatory strategies Compensations: Slow rate;Small sips/bites;Check for pocketing Postural Changes and/or Swallow Maneuvers: Seated upright 90 degrees Oral Care Recommendations: Oral care BID Patient destination: Home Follow up Recommendations: Home Health SLP;24 hour supervision/assistance Equipment Recommended: None recommended by SLP    SLP Frequency 5 out of 7 days   SLP Treatment/Interventions Cognitive remediation/compensation;Dysphagia/aspiration precaution training;Functional tasks;Patient/family education;Oral motor exercises;Internal/external aids;Environmental controls    Pain Pain Assessment Pain Assessment: No/denies pain Prior Functioning Cognitive/Linguistic Baseline: Within functional limits Type of Home: House  Lives With: Alone Available Help at Discharge: Family;Available 24 hours/day Education: 9th grade Vocation: Part time employment  Short Term Goals: Week 1: SLP Short Term Goal 1 (Week 1): Pt will tolerate presentations of his curently prescribed diet with no overt  s/s of aspiration with supervision cues for use of swallowing precautions.  SLP Short Term Goal 2 (Week 1): Pt will improve functional problem solving for basic tasks for 75% accuracy with mod assist.  SLP Short Term Goal 3 (Week 1): Pt will improve use of compensatory strategies to facilitate recall of new information for 75% accuracy with mod assist  SLP Short Term Goal 4 (Week 1): Pt will improve speech intelligibility at the conversational level to >90% with min cues for use of dysarthria strategies.  SLP Short Term Goal 5 (Week 1): Pt will complete pharyngeal and oral motor strengthening exercises to improve right sided weakness and overall swallowing function with min cues   See FIM for current functional status Refer to Care Plan for Long Term Goals  Recommendations for other services: None  Discharge Criteria: Patient will be discharged from SLP if patient refuses treatment 3 consecutive times without medical reason, if treatment goals not met, if there is a change in medical status, if patient makes no progress towards goals or if patient is discharged from hospital.  The above assessment, treatment plan, treatment alternatives and goals were discussed and mutually agreed upon: by patient and by family   Windell Moulding, M.A. CCC-SLP  Alvetta Hidrogo, Selinda Orion 11/21/2014, 4:36 PM

## 2014-11-21 NOTE — Progress Notes (Signed)
Social Work Assessment and Plan Social Work Assessment and Plan  Patient Details  Name: Chad Fuller MRN: [id number] Date of Birth: [birth year]  Today's Date: 11/21/2014  Problem List:  Patient Active Problem List   Diagnosis Date Noted  . Left pontine CVA 11/20/2014  . Dysphagia, post-stroke 11/20/2014  . TIA (transient ischemic attack) 11/19/2014  . Type 2 diabetes mellitus 11/19/2014  . Aortic heart murmur 11/19/2014  . Non-small cell carcinoma of lung 11/19/2014  . Recurrent falls 11/19/2014  . Dizziness and giddiness 11/19/2014  . CVA (cerebral infarction) 11/19/2014  . Right hemiparesis   . Type II or unspecified type diabetes mellitus with neurological manifestations, not stated as uncontrolled 11/14/2013  . Pain 11/14/2013  . Closed fracture of metatarsal bone(s) 10/10/2013  . ASTHMA 02/23/2009  . Coronary atherosclerosis 02/05/2009  . NEOPLASM, MALIGNANT, LUNG 02/04/2009  . DIABETES, TYPE 2 02/04/2009  . PULMONARY EMBOLISM 02/04/2009  . C O P D 02/04/2009  . DYSPNEA 02/04/2009   Past Medical History:  Past Medical History  Diagnosis Date  . Diabetes mellitus   . Cancer     lung  . Prostate enlargement   . History of blood clots   . CAD (coronary artery disease)   . Pulmonary embolus july 2005   Past Surgical History:  Past Surgical History  Procedure Laterality Date  . Lung removal, partial    . Coronary stent placement    . Knee surgery    . Filtering procedure      reports filter placed after knee surgery for blood clots  . I&d extremity  08/16/2012    Procedure: MINOR IRRIGATION AND DEBRIDEMENT EXTREMITY;  Surgeon: Tennis Must, MD;  Location: [place];  Service: Orthopedics;  Laterality: Right;   Social History:  reports that he quit smoking about 40 years ago. His smoking use included Cigarettes. He smoked 0.00 packs per day. He does not have any smokeless tobacco history on file. He reports that he does not drink alcohol or use  illicit drugs.  Family / Support Systems Marital Status: Widow/Widower How Long?: 8 years Patient Roles: Parent, Other (Comment) (Employee) Children: Chad Fuller-daughter  [phone]-cell Other Supports: Biomedical scientist Anticipated Caregiver: children Ability/Limitations of Caregiver: Between all of the children-three daughter's are within 10 miles of him. Caregiver Availability: 24/7 Family Dynamics: Close knit family their Mom's wish when she died was for all of them to stay together and they get othter on holidays all 61 of them.  Pt is very grateful for his family.  He becomes tearful when discussing all of them.  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  He has always been independent and prided himself on this.  Daughter reports he is stubborn and wants to do for himself.  He will try his best wile here and see what happens. Recent Psychosocial Issues: Other health issues-he thought he was healthy Pyschiatric History: No history-deferred depression screening due to tired from therapies and wanting to  rest.  He feels he is doing well with all of this and will let me know if this changes.  Will monitor him while here and intervene if necessary. Substance Abuse History: No issues  Patient / Family Perceptions, Expectations & Goals Pt/Family  understanding of illness & functional limitations: Pt and daughter have a good understanding of his stroke and deficits.  He can't believe how weak he is, he states; " It took all of my strength."  Daughter reports he is doing better which is a good sign.  Both feel their questions and concerns are being addressed. 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, Merchant navy officer, Employee, Home owner, church member, etc Anticipated changes in roles/activities/participation: resume Pt/family expectations/goals: Pt states: " I want to do for myself, like i was before this."  Daughter states: " I hope he does well, but we will all help him."  [employer]: None Premorbid Home Care/DME Agencies: None Transportation available at discharge: [employer] referrals recommended: Support group (specify) ([facility] SUpport group)  Discharge Planning Living Arrangements: Alone Support Systems: Children, Other relatives, Water engineer, Social worker community Type of Residence: Private residence Insurance Resources: [employer], Multimedia programmer (specify) Sports administrator) Financial Resources: Employment, Radio broadcast assistant Screen Referred: No Living Expenses: Own Money Management: Patient Does the patient have any problems obtaining your medications?: No Home Management: Patient Patient/Family Preliminary Plans: Return home with children assisting with his care-daugther reports they will do what they care they have health issues of their own.  She is recovering from shoulder surgery.  All very committed to taking care of pt at home. Social Work Anticipated Follow Up Needs: HH/OP, Support Group  Clinical Impression Pleasant gentleman who is motivated to do well here and will do whatever the therapists ask him to do.  Daughter is here and supportive and confirms her siblings will do what Dad needs. Will work on a safe discharge plan.  Elease Hashimoto 11/21/2014, 2:27 PM

## 2014-11-21 NOTE — Plan of Care (Signed)
Problem: RH BOWEL ELIMINATION Goal: RH STG MANAGE BOWEL WITH ASSISTANCE STG Manage Bowel with Assistance. Mod I  Outcome: Progressing Goal: RH STG MANAGE BOWEL W/MEDICATION W/ASSISTANCE STG Manage Bowel with Medication with min Assistance.  Outcome: Progressing  Problem: RH BLADDER ELIMINATION Goal: RH STG MANAGE BLADDER WITH ASSISTANCE STG Manage Bladder With Assistance. Mod I  Outcome: Progressing  Problem: RH SKIN INTEGRITY Goal: RH STG SKIN FREE OF INFECTION/BREAKDOWN Skin to remain free from infection/breakdown while on rehab with min assist  Outcome: Progressing  Problem: RH SAFETY Goal: RH STG ADHERE TO SAFETY PRECAUTIONS W/ASSISTANCE/DEVICE STG Adhere to Safety Precautions With Assistance/Device. Supervision  Outcome: Progressing  Problem: RH PAIN MANAGEMENT Goal: RH STG PAIN MANAGED AT OR BELOW PT'S PAIN GOAL <4  Outcome: Progressing  Problem: RH KNOWLEDGE DEFICIT Goal: RH STG INCREASE KNOWLEDGE OF DIABETES Patient will state s/s of hypo/hyperglycemia, dietary and medication management with mod assist  Outcome: Progressing

## 2014-11-22 LAB — GLUCOSE, CAPILLARY
GLUCOSE-CAPILLARY: 89 mg/dL (ref 70–99)
Glucose-Capillary: 268 mg/dL — ABNORMAL HIGH (ref 70–99)
Glucose-Capillary: 226 mg/dL — ABNORMAL HIGH (ref 70–99)
Glucose-Capillary: 186 mg/dL — ABNORMAL HIGH (ref 70–99)

## 2014-11-22 MED ORDER — BACITRACIN ZINC 500 UNIT/GM EX OINT
TOPICAL_OINTMENT | Freq: Every day | CUTANEOUS | Status: DC
  Administered 2014-11-22: 1 via TOPICAL
  Administered 2014-11-23 – 2014-12-04 (×9): via TOPICAL
  Filled 2014-11-22: qty 15

## 2014-11-22 NOTE — Plan of Care (Signed)
Problem: RH BOWEL ELIMINATION Goal: RH STG MANAGE BOWEL W/MEDICATION W/ASSISTANCE STG Manage Bowel with Medication with min Assistance.  Outcome: Progressing

## 2014-11-22 NOTE — Plan of Care (Signed)
Problem: RH BOWEL ELIMINATION Goal: RH STG MANAGE BOWEL WITH ASSISTANCE STG Manage Bowel with Assistance. Mod I  Outcome: Progressing Goal: RH STG MANAGE BOWEL W/MEDICATION W/ASSISTANCE STG Manage Bowel with Medication with min Assistance.  Outcome: Progressing  Problem: RH BLADDER ELIMINATION Goal: RH STG MANAGE BLADDER WITH ASSISTANCE STG Manage Bladder With Assistance. Mod I  Outcome: Progressing  Problem: RH SKIN INTEGRITY Goal: RH STG SKIN FREE OF INFECTION/BREAKDOWN Skin to remain free from infection/breakdown while on rehab with min assist  Outcome: Progressing  Problem: RH SAFETY Goal: RH STG ADHERE TO SAFETY PRECAUTIONS W/ASSISTANCE/DEVICE STG Adhere to Safety Precautions With Assistance/Device. Supervision  Outcome: Progressing  Problem: RH PAIN MANAGEMENT Goal: RH STG PAIN MANAGED AT OR BELOW PT'S PAIN GOAL <4  Outcome: Progressing  Problem: RH KNOWLEDGE DEFICIT Goal: RH STG INCREASE KNOWLEDGE OF DIABETES Patient will state s/s of hypo/hyperglycemia, dietary and medication management with mod assist  Outcome: Progressing

## 2014-11-22 NOTE — Plan of Care (Signed)
Problem: RH PAIN MANAGEMENT Goal: RH STG PAIN MANAGED AT OR BELOW PT'S PAIN GOAL <4  Outcome: Progressing

## 2014-11-22 NOTE — Plan of Care (Signed)
Problem: RH BLADDER ELIMINATION Goal: RH STG MANAGE BLADDER WITH ASSISTANCE STG Manage Bladder With Assistance. Mod I  Outcome: Progressing

## 2014-11-22 NOTE — Plan of Care (Signed)
Problem: RH SAFETY Goal: RH STG ADHERE TO SAFETY PRECAUTIONS W/ASSISTANCE/DEVICE STG Adhere to Safety Precautions With Assistance/Device. Supervision  Outcome: Progressing

## 2014-11-22 NOTE — Progress Notes (Addendum)
   11/22/14 0815  What Happened  Was fall witnessed? Yes  Who witnessed fall? Regino Bellow, NT  Patients activity before fall ambulating-assisted  Point of contact back ((R) lower back against trash can)  Was patient injured? No  Follow Up  MD notified Algis Liming, PA  Time MD notified [place] notified Yes-comment  Time family notified 0900  Simple treatment Dressing (bacticin ointment)  Adult Fall Risk Assessment  Risk Factor Category (scoring not indicated) High fall risk per protocol (document High fall risk);History of more than one fall within 6 months before admission (document High fall risk)  Patient's Fall Risk High Fall Risk (>13 points)  Adult Fall Risk Interventions  Required Bundle Interventions *See Row Information* High fall risk - low, moderate, and high requirements implemented  Additional Interventions Fall risk signage;Individualized elimination schedule  Neurological  Neuro (WDL) WDL  Level of Consciousness Alert  Orientation Level Oriented X4  Cognition Appropriate at baseline  Speech Clear  R Hand Grip Present;Moderate  L Hand Grip Present;Moderate   RUE Motor Response Purposeful movement  RUE Sensation Full sensation  RUE Motor Strength 4  LUE Motor Response Purposeful movement  LUE Sensation Full sensation  LUE Motor Strength 5  RLE Motor Response Purposeful movement  RLE Sensation Full sensation  RLE Motor Strength 4  LLE Motor Response Purposeful movement  LLE Sensation Full sensation  LLE Motor Strength 4  Musculoskeletal  Musculoskeletal (WDL) X  Assistive Device Front wheel walker  Generalized Weakness Yes  Integumentary  Integumentary (WDL) X  Skin Color Appropriate for ethnicity  Skin Condition Dry  Skin Integrity Abrasion;Ecchymosis  Abrasion Location Toe (Comment  which one);Back ((R) great toe, small scratch (R) lower back)  Abrasion Location Orientation Right  Abrasion Intervention Other (Comment) (bactricin ointment with dry  dressing)  Post fall note completed by Vedia Pereyra, LPN, filed by this Probation officer .

## 2014-11-22 NOTE — Progress Notes (Signed)
80 year old male with history of DM type 2 with neuropathy, PE, NSCLC s/p resection, CAD who was admitted on 11/18/14 with slurred speech, dizziness and difficulty walking with fall. CT head without acute changes, generalized atrophy and right forehead contusion. CT cervical spine with stenosis C3/4 to C 5/6. MRI/MRA brain with Small acute brainstem infarct in the left paracentral pons and no significant stenosis.  2D echo with EF 60%, AV sclerosis and mitral annular calcification without stenosis. Carotid dopplers without significant stenosis. Neurology recommended ASA for thrombotic stroke due to SVD. Speech therapy evaluation with dysphagia and FEES done  Subjective/Complaints: Slept well. Denies new complaints. Right sided weakness persistent  Objective: Vital Signs: Blood pressure 130/75, pulse 78, temperature 97.8 F (36.6 C), temperature source Oral, resp. rate 18, weight 104.282 kg (229 lb 14.4 oz), SpO2 100 %. No results found. Results for orders placed or performed during the hospital encounter of 11/20/14 (from the past 72 hour(s))  Glucose, capillary     Status: Abnormal   Collection Time: 11/20/14  8:55 PM  Result Value Ref Range   Glucose-Capillary 254 (H) 70 - 99 mg/dL  CBC WITH DIFFERENTIAL     Status: None   Collection Time: 11/21/14  6:30 AM  Result Value Ref Range   WBC 7.4 4.0 - 10.5 K/uL   RBC 4.49 4.22 - 5.81 MIL/uL   Hemoglobin 14.0 13.0 - 17.0 g/dL   HCT 41.1 39.0 - 52.0 %   MCV 91.5 78.0 - 100.0 fL   MCH 31.2 26.0 - 34.0 pg   MCHC 34.1 30.0 - 36.0 g/dL   RDW 12.8 11.5 - 15.5 %   Platelets 226 150 - 400 K/uL   Neutrophils Relative % 65 43 - 77 %   Neutro Abs 4.8 1.7 - 7.7 K/uL   Lymphocytes Relative 24 12 - 46 %   Lymphs Abs 1.8 0.7 - 4.0 K/uL   Monocytes Relative 8 3 - 12 %   Monocytes Absolute 0.6 0.1 - 1.0 K/uL   Eosinophils Relative 3 0 - 5 %   Eosinophils Absolute 0.2 0.0 - 0.7 K/uL   Basophils Relative 0 0 - 1 %   Basophils Absolute 0.0 0.0 - 0.1 K/uL   Comprehensive metabolic panel     Status: Abnormal   Collection Time: 11/21/14  6:30 AM  Result Value Ref Range   Sodium 140 137 - 147 mEq/L   Potassium 4.1 3.7 - 5.3 mEq/L   Chloride 101 96 - 112 mEq/L   CO2 27 19 - 32 mEq/L   Glucose, Bld 137 (H) 70 - 99 mg/dL   BUN 17 6 - 23 mg/dL   Creatinine, Ser 0.65 0.50 - 1.35 mg/dL   Calcium 9.1 8.4 - 10.5 mg/dL   Total Protein 6.5 6.0 - 8.3 g/dL   Albumin 3.6 3.5 - 5.2 g/dL   AST 17 0 - 37 U/L   ALT 13 0 - 53 U/L   Alkaline Phosphatase 101 39 - 117 U/L   Total Bilirubin 0.7 0.3 - 1.2 mg/dL   GFR calc non Af Amer 86 (L) >90 mL/min   GFR calc Af Amer >90 >90 mL/min    Comment: (NOTE) The eGFR has been calculated using the CKD EPI equation. This calculation has not been validated in all clinical situations. eGFR's persistently <90 mL/min signify possible Chronic Kidney Disease.    Anion gap 12 5 - 15  Glucose, capillary     Status: Abnormal   Collection Time: 11/21/14  6:47 AM  Result Value Ref Range   Glucose-Capillary 129 (H) 70 - 99 mg/dL   Comment 1 Notify RN   Glucose, capillary     Status: Abnormal   Collection Time: 11/21/14 11:19 AM  Result Value Ref Range   Glucose-Capillary 221 (H) 70 - 99 mg/dL   Comment 1 Notify RN   Glucose, capillary     Status: Abnormal   Collection Time: 11/21/14  4:32 PM  Result Value Ref Range   Glucose-Capillary 262 (H) 70 - 99 mg/dL  Glucose, capillary     Status: Abnormal   Collection Time: 11/21/14  9:00 PM  Result Value Ref Range   Glucose-Capillary 252 (H) 70 - 99 mg/dL  Glucose, capillary     Status: None   Collection Time: 11/22/14  6:58 AM  Result Value Ref Range   Glucose-Capillary 89 70 - 99 mg/dL     HEENT: normal Cardio: RRR and no murmurs Resp: CTA B/L and unlabored GI: BS positive and NT,ND Extremity:  Pulses positive and No Edema Skin:   Intact and Wound R forehead ecchymosis, faint, non tender Neuro: Alert/Oriented, Abnormal Sensory decreased LT sensation bilateral  feet, Abnormal Motor 3- R delt, bi, tri, grip, HF, KE, ADF and Abnormal FMC Ataxic/ dec FMC Musc/Skel:  Other tightness bilateral hamstrings,right more than left Gen NAD   Assessment/Plan: 1. 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 Physiatrist is providing close team supervision and 24 hour management of active medical problems listed below. Physiatrist and rehab team continue to assess barriers to discharge/monitor patient progress toward functional and medical goals. FIM: FIM - Bathing Bathing Steps Patient Completed: Chest, Right Arm, Left Arm, Abdomen, Left upper leg, Right upper leg, Buttocks, Front perineal area, Left lower leg (including foot) Bathing: 4: Min-Patient completes 8-9 [id number] parts or 75+ percent  FIM - Upper Body Dressing/Undressing Upper body dressing/undressing steps patient completed: Thread/unthread right sleeve of pullover shirt/dresss, Thread/unthread left sleeve of pullover shirt/dress, Put head through opening of pull over shirt/dress, Pull shirt over trunk Upper body dressing/undressing: 5: Set-up assist to: Obtain clothing/put away FIM - Lower Body Dressing/Undressing Lower body dressing/undressing steps patient completed: Thread/unthread left pants leg, Pull pants up/down, Don/Doff left sock, Don/Doff left shoe, Fasten/unfasten left shoe Lower body dressing/undressing: 3: Mod-Patient completed 50-74% of tasks  FIM - Toileting Toileting steps completed by patient: Performs perineal hygiene, Adjust clothing after toileting, Adjust clothing prior to toileting Toileting Assistive Devices: Grab bar or rail for support Toileting: 4: Steadying assist  FIM - TRadio producerDevices: WInsurance account managerTransfers: 4-From toilet/BSC: Min A (steadying Pt. > 75%), 4-To toilet/BSC: Min A (steadying  Pt. > 75%)  FIM - Bed/Chair Transfer Bed/Chair Transfer Assistive Devices: Arm rests Bed/Chair Transfer: 2: Bed > Chair or W/C: Max A (lift and lower assist), 3: Chair or W/C > Bed: Mod A (lift or lower assist)  FIM - Locomotion: Wheelchair Distance: 25 Locomotion: Wheelchair: 1: Travels less than 50 ft with minimal assistance (Pt.>75%) FIM - Locomotion: Ambulation Locomotion: Ambulation Assistive Devices: Other (comment) (none) Ambulation/Gait Assistance: 1: +2 Total assist Locomotion: Ambulation: 1: Two helpers  Comprehension Comprehension Mode: Auditory Comprehension: 5-Follows basic conversation/direction: With extra time/assistive device  Expression Expression Mode: Verbal Expression: 4-Expresses basic 75 - 89% of the time/requires cueing 10 - 24% of the time. Needs helper to occlude trach/needs to repeat words.  Social Interaction Social Interaction: 4-Interacts appropriately 75 - 89% of the time - Needs redirection for appropriate language or to initiate interaction.  Problem Solving Problem Solving Mode: Not assessed Problem Solving: 4-Solves basic 75 - 89% of the time/requires cueing 10 - 24% of the time  Memory Memory: 3-Recognizes or recalls 50 - 74% of the time/requires cueing 25 - 49% of the time  Medical Problem List and Plan: 1. Functional deficits secondary to Left paracentral pontine stroke 2.  DVT Prophylaxis/Anticoagulation: Pharmaceutical: Lovenox 3. Pain Management: Tylenol prn for back pain.   K pad ordered   4. Mood: LCSW to follow for evaluation and support.   5. Neuropsych: This patient is capable of making decisions on his own behalf. 6. Skin/Wound Care: Routine pressure relief measures. Rehab RN to monitor skin daily 7. Fluids/Electrolytes/Nutrition: Monitor I/O. Offer nutritional supplement prn if intake poor.   8. DM type 2 with neuropathy: Will monitor BS with ac/hs checks. Continue lantus and resume metformin as blood sugars poorly controlled.   9.  BPH: Continue proscar. Monitor for any voiding difficulty.    10.  HTN: Will monitor blood pressure tid. Currently controlled off HCTZ.   11. GERD: will add Pepcid for symptoms.    LOS (Days) 2 A FACE TO FACE EVALUATION WAS PERFORMED  Lucinda Spells T 11/22/2014, 8:22 AM

## 2014-11-22 NOTE — Plan of Care (Signed)
Problem: RH SKIN INTEGRITY Goal: RH STG SKIN FREE OF INFECTION/BREAKDOWN Skin to remain free from infection/breakdown while on rehab with min assist  Outcome: Progressing

## 2014-11-22 NOTE — Plan of Care (Signed)
Problem: RH BOWEL ELIMINATION Goal: RH STG MANAGE BOWEL WITH ASSISTANCE STG Manage Bowel with Assistance. Mod I  Outcome: Progressing

## 2014-11-22 NOTE — Plan of Care (Signed)
Problem: RH KNOWLEDGE DEFICIT Goal: RH STG INCREASE KNOWLEDGE OF DIABETES Patient will state s/s of hypo/hyperglycemia, dietary and medication management with mod assist  Outcome: Progressing

## 2014-11-23 ENCOUNTER — Inpatient Hospital Stay (HOSPITAL_COMMUNITY): Payer: Medicare Other | Admitting: Speech Pathology

## 2014-11-23 ENCOUNTER — Inpatient Hospital Stay (HOSPITAL_COMMUNITY): Payer: Medicare Other | Admitting: Occupational Therapy

## 2014-11-23 ENCOUNTER — Inpatient Hospital Stay (HOSPITAL_COMMUNITY): Payer: Medicare Other | Admitting: *Deleted

## 2014-11-23 LAB — GLUCOSE, CAPILLARY
GLUCOSE-CAPILLARY: 146 mg/dL — AB (ref 70–99)
GLUCOSE-CAPILLARY: 157 mg/dL — AB (ref 70–99)
GLUCOSE-CAPILLARY: 180 mg/dL — AB (ref 70–99)
GLUCOSE-CAPILLARY: 218 mg/dL — AB (ref 70–99)
Glucose-Capillary: 150 mg/dL — ABNORMAL HIGH (ref 70–99)
Glucose-Capillary: 249 mg/dL — ABNORMAL HIGH (ref 70–99)

## 2014-11-23 MED ORDER — HYDROCHLOROTHIAZIDE 10 MG/ML ORAL SUSPENSION
6.2500 mg | Freq: Every day | ORAL | Status: DC
  Administered 2014-11-23 – 2014-12-05 (×13): 6.25 mg via ORAL
  Filled 2014-11-23 (×19): qty 1.25

## 2014-11-23 NOTE — Progress Notes (Signed)
Speech Language Pathology Daily Session Note  Patient Details  Name: Chad Fuller MRN: [id number] Date of Birth: [birth year]  Today's Date: 11/23/2014 SLP Individual Time: 1300-1400 SLP Individual Time Calculation (min): 60 min  Short Term Goals: Week 1: SLP Short Term Goal 1 (Week 1): Pt will tolerate presentations of his curently prescribed diet with no overt s/s of aspiration with supervision cues for use of swallowing precautions.  SLP Short Term Goal 2 (Week 1): Pt will improve functional problem solving for basic tasks for 75% accuracy with mod assist.  SLP Short Term Goal 3 (Week 1): Pt will improve use of compensatory strategies to facilitate recall of new information for 75% accuracy with mod assist  SLP Short Term Goal 4 (Week 1): Pt will improve speech intelligibility at the conversational level to >90% with min cues for use of dysarthria strategies.  SLP Short Term Goal 5 (Week 1): Pt will complete pharyngeal and oral motor strengthening exercises to improve right sided weakness and overall swallowing function with min cues   Skilled Therapeutic Interventions:  Pt was seen for skilled ST targeting goals for dysphagia and cognition.  Upon arrival, pt was seated upright in wheelchair, awake, alert, and agreeable to participate in [place].  SLP completed skilled observations during trials of nectar thick liquids via cup sip with pt exhibiting immediate cough on 1 out of 2 trials.  Pt required mod multimodal cues to recall to use a teaspoon versus cup sips.  S/s of aspiration were minimized to intermittently wet vocal quality with use of teaspoon when consuming nectar thick liquids.  No overt s/s of aspiration noted with solid consistencies and pt exhibited adequate oral clearance of residuals.  Upon completion of meal, SLP facilitated the session with a basic new learning activity targeting attention and functional problem solving in a moderately distracting environment.  Pt required overall  mod faded to min assist verbal cues for organization and error awareness to complete the abovementioned activity.   No cuing needed for redirection due to environmental distractions; however, pt benefited from min cues for topic maintenance as he was noted with tangential speech output.  Overall, pt is making good progress towards meeting goals.  Continue per current plan of care.     FIM:  Comprehension Comprehension Mode: Auditory Comprehension: 5-Follows basic conversation/direction: With extra time/assistive device Expression Expression Mode: Verbal Expression: 5-Expresses basic needs/ideas: With extra time/assistive device Social Interaction Social Interaction: 4-Interacts appropriately 75 - 89% of the time - Needs redirection for appropriate language or to initiate interaction. Problem Solving Problem Solving: 4-Solves basic 75 - 89% of the time/requires cueing 10 - 24% of the time Memory Memory: 3-Recognizes or recalls 50 - 74% of the time/requires cueing 25 - 49% of the time FIM - Eating Eating Activity: 5: Needs verbal cues/supervision  Pain Pain Assessment Pain Assessment: No/denies pain  Therapy/Group: Individual Therapy   Chad Fuller, M.A. CCC-SLP  Chad Fuller, Selinda Orion 11/23/2014, 4:06 PM

## 2014-11-23 NOTE — Progress Notes (Signed)
Occupational Therapy Session Note  Patient Details  Name: Chad Fuller MRN: [id number] Date of Birth: 1926-10-27  Today's Date: 11/23/2014 OT Individual Time: 1005-1105 OT Individual Time Calculation (min): 60 min    Short Term Goals: Week 1:  OT Short Term Goal 1 (Week 1): Pt will ambulate to toilet with close S with RW. OT Short Term Goal 2 (Week 1): Pt will don his pants over his R Fuller. OT Short Term Goal 3 (Week 1): Pt will don sock and shoe on R Fuller.  OT Short Term Goal 4 (Week 1): Pt will stand at sink to groom with min A.  Skilled Therapeutic Interventions/Progress Updates:    Pt seen for BADL retraining with a focus on postural control, balance, awareness of Fuller placement.  Pt in chair and ambulated with Rw with min A to toilet. He did not need to use it. Doffed clothing to prepare for shower. RW to shower min A. Bathed self with A to cross R Fuller over left knee to reach R Fuller.  Steady A in standing.  Pt then ambulated to w/c in room with min with RW. Today, donned pants over feet without difficulty. Stood with steady A to pull over hips. Pt needs cues with standing in shower and in room to separate feet to a wider BOS and cues to reach back when sitting.  Needed min A with R sock and shoe. Family arrived and updated them on pt's progress. Quick release belt applied and call light in reach. Therapy Documentation Precautions:  Precautions Precautions: Fall Precaution Comments: multiple falls at home; stated he falls up or down entry steps at home about once per month, due to "not knowing where his feet are" Restrictions Weight Bearing Restrictions: No    Pain: Pain Assessment Pain Assessment: No/denies pain ADL: ADL ADL Comments: Refer to FIM   See FIM for current functional status  Therapy/Group: Individual Therapy  Tomi Grandpre 11/23/2014, 11:51 AM

## 2014-11-23 NOTE — Progress Notes (Signed)
Physical Therapy Session Note  Patient Details  Name: Chad Fuller MRN: [id number] Date of Birth: 1926-01-08  Today's Date: 11/23/2014 PT Individual Time:  8:00-9:00 (48min)    Short Term Goals: Week 1:  PT Short Term Goal 1 (Week 1): pt will perform w/c>< bed transfer with min assist 50% of time PT Short Term Goal 2 (Week 1): pt will propel w/c x 150' with supervision/min assist PT Short Term Goal 3 (Week 1): pt will perform sit>< stand with min assist PT Short Term Goal 4 (Week 1): pt will perform gait with LRAD x 50' with mod assist PT Short Term Goal 5 (Week 1): pt will ascend/descend 5 steps with min assist without impulsivity  Skilled Therapeutic Interventions/Progress Updates:  Tx focused on trunk stability, NMR, functional mobility, transfers, and gait with RW. Pt up in [facility], ready to go with no complaints.  Instructed pt in [facility] propulsion x150' with S cues for technique, bil UEs and occasional LLE for steering. Increased time required. Pt stayed quite close to R wall with 2 min brushes with wall.   Instructed pt in squat-pivot transfers with Mod A overall for completing turn. Pt needed sequence and technique cues.   Performed trunk stability progressing from firm mat and feet supported>>feet unsupported>>sitting on disc for reaching/placing task outside BOS with RUE. Pt compensates with LUE support despite cues.   Performed seated NMR for marchign and LAQ to icnrease RLE motor control and activation x20 each with cues.  Performed passive HS stretch during seated rest x62min each.   Instructed pt in static and dynamic standing balance with/wihtout UE support at RW depending on safety needs for satic stance, lateral weight shifting, L step-taps, mini-squats, marching, and RLE knee control.   Gait x60' with RW and up to mod A during turns. Pt needs cues for posture, RW management, and manual facilitation for RLE control in stance.   Pt left up in [facility] in room with lap belt and all  needs inreach.        Therapy Documentation Precautions:  Precautions Precautions: Fall Precaution Comments: multiple falls at home; stated he falls up or down entry steps at home about once per month, due to "not knowing where his feet are" Restrictions Weight Bearing Restrictions: No General:   Vital Signs: Therapy Vitals Temp: 98.3 F (36.8 C) Temp Source: Oral Pulse Rate: 78 Resp: 18 BP: 128/64 mmHg Patient Position (if appropriate): Lying Oxygen Therapy SpO2: 100 % O2 Device: Not Delivered Pain: none  See FIM for current functional status  Therapy/Group: Individual Therapy  Kennieth Rad, PT, DPT  11/23/2014, 8:06 AM

## 2014-11-23 NOTE — Plan of Care (Signed)
Problem: RH BOWEL ELIMINATION Goal: RH STG MANAGE BOWEL WITH ASSISTANCE STG Manage Bowel with Assistance. Mod I  Outcome: Progressing Goal: RH STG MANAGE BOWEL W/MEDICATION W/ASSISTANCE STG Manage Bowel with Medication with min Assistance.  Outcome: Progressing  Problem: RH BLADDER ELIMINATION Goal: RH STG MANAGE BLADDER WITH ASSISTANCE STG Manage Bladder With Assistance. Mod I  Outcome: Progressing  Problem: RH SKIN INTEGRITY Goal: RH STG SKIN FREE OF INFECTION/BREAKDOWN Skin to remain free from infection/breakdown while on rehab with min assist  Outcome: Progressing  Problem: RH SAFETY Goal: RH STG ADHERE TO SAFETY PRECAUTIONS W/ASSISTANCE/DEVICE STG Adhere to Safety Precautions With Assistance/Device. Supervision  Outcome: Progressing  Problem: RH PAIN MANAGEMENT Goal: RH STG PAIN MANAGED AT OR BELOW PT'S PAIN GOAL <4  Outcome: Progressing Patient not complaining of pain  Problem: RH KNOWLEDGE DEFICIT Goal: RH STG INCREASE KNOWLEDGE OF DIABETES Patient will state s/s of hypo/hyperglycemia, dietary and medication management with mod assist  Outcome: Progressing

## 2014-11-23 NOTE — IPOC Note (Addendum)
Overall Plan of Care [facility]) Patient Details Name: Chad Fuller MRN: [id number] DOB: 11/01/1926  Admitting Diagnosis: Lung Ca   Hospital Problems: Principal Problem:   Left pontine CVA Active Problems:   Type 2 diabetes mellitus   Right hemiparesis   Dysphagia, post-stroke     Functional Problem List: Nursing Endurance, Nutrition  PT Balance, Endurance, Motor, Safety, Sensory  OT Balance, Cognition, Motor, Sensory  SLP Cognition, Safety, Nutrition  TR Activity tolerance, functional mobility, balance, cognition, safety       Basic ADL's: OT Bathing, Dressing, Toileting     Advanced  ADL's: OT Simple Meal Preparation, Light Housekeeping     Transfers: PT Bed Mobility, Bed to Chair, Car, [employer], Futures trader, Metallurgist: PT Ambulation, Emergency planning/management officer, Stairs     Additional Impairments: OT None  SLP Swallowing, Communication, Social Cognition expression Problem Solving, Memory, Awareness, Attention  TR      Anticipated Outcomes Item Anticipated Outcome  Self Feeding I  Swallowing  supervision with least restrictive diet    Basic self-care  supervision  Toileting  supervision   Bathroom Transfers supervision  Bowel/Bladder  Mod I  Transfers  supervision for basic , floor and car  Locomotion  supervision w/c propulsion x 150',  gait x 150', up/down 5 steps 1 rail  Communication  mod I  Cognition  min assist   Pain  <3  Safety/Judgment  Supervision   Therapy Plan: PT Intensity: Minimum of 1-2 x/day ,45 to 90 minutes PT Frequency: 5 out of 7 days PT Duration Estimated Length of Stay: 14-17 days OT Intensity: Minimum of 1-2 x/day, 45 to 90 minutes OT Frequency: 5 out of 7 days OT Duration/Estimated Length of Stay: 2-3 weeks SLP Intensity: Minumum of 1-2 x/day, 30 to 90 minutes SLP Frequency: 5 out of 7 days SLP Duration/Estimated Length of Stay: 14 days   TR Duration/ELOS:  2 weeks TR Frequency:  Min 1 time per week  >20 minutes        Team Interventions: Nursing Interventions Patient/Family Education, Disease Management/Prevention  PT interventions Ambulation/gait training, Training and development officer, Cognitive remediation/compensation, Academic librarian, Engineer, drilling, Discharge planning, Functional mobility training, Neuromuscular re-education, Patient/family education, Stair training, Splinting/orthotics, Psychosocial support, Therapeutic Activities, Therapeutic Exercise, UE/LE Strength taining/ROM, UE/LE Coordination activities, Wheelchair propulsion/positioning  OT Interventions Training and development officer, Cognitive remediation/compensation, Discharge planning, DME/adaptive equipment instruction, Functional mobility training, Patient/family education, Self Care/advanced ADL retraining, UE/LE Strength taining/ROM, UE/LE Coordination activities, Therapeutic Exercise, Therapeutic Activities, Neuromuscular re-education  SLP Interventions Cognitive remediation/compensation, Dysphagia/aspiration precaution training, Functional tasks, Patient/family education, Oral motor exercises, Internal/external aids, Environmental controls  TR Interventions Recreation/leisure participation, Balance/Vestibular training, functional mobility, therapeutic activities, UE/LE strength/coordination, cognitive retraining/compensation,  w/c mobility, community reintegration, pt/family education, adaptive equipment instruction/use, discharge planning, psychosocial support  SW/CM Interventions Discharge Planning, Barrister's clerk, Patient/Family Education    Team Discharge Planning: Destination: PT-Home ,OT- Home , SLP-Home Projected Follow-up: PT-Home health PT, OT-  Home health OT, SLP-Home Health SLP, 24 hour supervision/assistance Projected Equipment Needs: PT-To be determined, OT- Tub/shower bench, SLP-None recommended by SLP Equipment Details: PT-per pt, he owns RW and SPC, OT-  Patient/family  involved in discharge planning: PT- Patient,  OT-Patient, SLP-Patient, Family member/caregiver  MD ELOS: 9-14 days  Medical Rehab Prognosis:  Good Assessment: 80 year old male with history of DM type 2 with neuropathy, PE, NSCLC s/p resection, CAD who was admitted on 11/18/14 with slurred speech, dizziness and difficulty walking with fall. CT head without acute  changes, generalized atrophy and right forehead contusion. CT cervical spine with stenosis C3/4 to C 5/6. MRI/MRA brain with Small acute brainstem infarct in the left paracentral pons and no significant stenosis.  2D echo with EF 60%, AV sclerosis and mitral annular calcification without stenosis. Carotid dopplers without significant stenosis. Neurology recommended ASA for thrombotic stroke due to SVD.    Now requiring 24/7 Rehab RN,MD, as well as CIR level PT, OT and SLP.  Treatment team will focus on ADLs and mobility with goals set at Sup  See Team Conference Notes for weekly updates to the plan of care

## 2014-11-23 NOTE — Progress Notes (Signed)
80 year old male with history of DM type 2 with neuropathy, PE, NSCLC s/p resection, CAD who was admitted on 11/18/14 with slurred speech, dizziness and difficulty walking with fall. CT head without acute changes, generalized atrophy and right forehead contusion. CT cervical spine with stenosis C3/4 to C 5/6. MRI/MRA brain with Small acute brainstem infarct in the left paracentral pons and no significant stenosis.  2D echo with EF 60%, AV sclerosis and mitral annular calcification without stenosis. Carotid dopplers without significant stenosis. Neurology recommended ASA for thrombotic stroke due to SVD. Speech therapy evaluation with dysphagia and FEES done  Subjective/Complaints: C/o Right sided weakness No new issues overnite Review of Systems - Negative except as above Objective: Vital Signs: Blood pressure 128/64, pulse 78, temperature 98.3 F (36.8 C), temperature source Oral, resp. rate 18, weight 104.282 kg (229 lb 14.4 oz), SpO2 100 %. No results found. Results for orders placed or performed during the hospital encounter of 11/20/14 (from the past 72 hour(s))  Glucose, capillary     Status: Abnormal   Collection Time: 11/20/14  8:55 PM  Result Value Ref Range   Glucose-Capillary 254 (H) 70 - 99 mg/dL  CBC WITH DIFFERENTIAL     Status: None   Collection Time: 11/21/14  6:30 AM  Result Value Ref Range   WBC 7.4 4.0 - 10.5 K/uL   RBC 4.49 4.22 - 5.81 MIL/uL   Hemoglobin 14.0 13.0 - 17.0 g/dL   HCT 41.1 39.0 - 52.0 %   MCV 91.5 78.0 - 100.0 fL   MCH 31.2 26.0 - 34.0 pg   MCHC 34.1 30.0 - 36.0 g/dL   RDW 12.8 11.5 - 15.5 %   Platelets 226 150 - 400 K/uL   Neutrophils Relative % 65 43 - 77 %   Neutro Abs 4.8 1.7 - 7.7 K/uL   Lymphocytes Relative 24 12 - 46 %   Lymphs Abs 1.8 0.7 - 4.0 K/uL   Monocytes Relative 8 3 - 12 %   Monocytes Absolute 0.6 0.1 - 1.0 K/uL   Eosinophils Relative 3 0 - 5 %   Eosinophils Absolute 0.2 0.0 - 0.7 K/uL   Basophils Relative 0 0 - 1 %   Basophils  Absolute 0.0 0.0 - 0.1 K/uL  Comprehensive metabolic panel     Status: Abnormal   Collection Time: 11/21/14  6:30 AM  Result Value Ref Range   Sodium 140 137 - 147 mEq/L   Potassium 4.1 3.7 - 5.3 mEq/L   Chloride 101 96 - 112 mEq/L   CO2 27 19 - 32 mEq/L   Glucose, Bld 137 (H) 70 - 99 mg/dL   BUN 17 6 - 23 mg/dL   Creatinine, Ser 0.65 0.50 - 1.35 mg/dL   Calcium 9.1 8.4 - 10.5 mg/dL   Total Protein 6.5 6.0 - 8.3 g/dL   Albumin 3.6 3.5 - 5.2 g/dL   AST 17 0 - 37 U/L   ALT 13 0 - 53 U/L   Alkaline Phosphatase 101 39 - 117 U/L   Total Bilirubin 0.7 0.3 - 1.2 mg/dL   GFR calc non Af Amer 86 (L) >90 mL/min   GFR calc Af Amer >90 >90 mL/min    Comment: (NOTE) The eGFR has been calculated using the CKD EPI equation. This calculation has not been validated in all clinical situations. eGFR's persistently <90 mL/min signify possible Chronic Kidney Disease.    Anion gap 12 5 - 15  Glucose, capillary     Status:  Abnormal   Collection Time: 11/21/14  6:47 AM  Result Value Ref Range   Glucose-Capillary 129 (H) 70 - 99 mg/dL   Comment 1 Notify RN   Glucose, capillary     Status: Abnormal   Collection Time: 11/21/14 11:19 AM  Result Value Ref Range   Glucose-Capillary 221 (H) 70 - 99 mg/dL   Comment 1 Notify RN   Glucose, capillary     Status: Abnormal   Collection Time: 11/21/14  4:32 PM  Result Value Ref Range   Glucose-Capillary 262 (H) 70 - 99 mg/dL  Glucose, capillary     Status: Abnormal   Collection Time: 11/21/14  9:00 PM  Result Value Ref Range   Glucose-Capillary 252 (H) 70 - 99 mg/dL  Glucose, capillary     Status: None   Collection Time: 11/22/14  6:58 AM  Result Value Ref Range   Glucose-Capillary 89 70 - 99 mg/dL  Glucose, capillary     Status: Abnormal   Collection Time: 11/22/14 11:37 AM  Result Value Ref Range   Glucose-Capillary 268 (H) 70 - 99 mg/dL   Comment 1 Notify RN   Glucose, capillary     Status: Abnormal   Collection Time: 11/22/14  4:12 PM  Result  Value Ref Range   Glucose-Capillary 226 (H) 70 - 99 mg/dL   Comment 1 Notify RN   Glucose, capillary     Status: Abnormal   Collection Time: 11/22/14  9:23 PM  Result Value Ref Range   Glucose-Capillary 186 (H) 70 - 99 mg/dL  Glucose, capillary     Status: Abnormal   Collection Time: 11/23/14  4:18 AM  Result Value Ref Range   Glucose-Capillary 150 (H) 70 - 99 mg/dL  Glucose, capillary     Status: Abnormal   Collection Time: 11/23/14  6:39 AM  Result Value Ref Range   Glucose-Capillary 157 (H) 70 - 99 mg/dL  Glucose, capillary     Status: Abnormal   Collection Time: 11/23/14  7:07 AM  Result Value Ref Range   Glucose-Capillary 146 (H) 70 - 99 mg/dL     HEENT: normal Cardio: RRR and no murmurs Resp: CTA B/L and unlabored GI: BS positive and NT,ND Extremity:  Pulses positive and No Edema Skin:   Intact and Wound R forehead ecchymosis, faint, non tender Neuro: Alert/Oriented, Abnormal Sensory decreased LT sensation bilateral feet, Abnormal Motor 3- R delt, bi, tri, grip, HF, KE, ADF and Abnormal FMC Ataxic/ dec FMC Musc/Skel:  Other tightness bilateral hamstrings Gen NAD   Assessment/Plan: 1. 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 Physiatrist is providing close team supervision and 24 hour management of active medical problems listed below. Physiatrist and rehab team continue to assess barriers to discharge/monitor patient progress toward functional and medical goals. FIM: FIM - Bathing Bathing Steps Patient Completed: Chest, Right Arm, Left Arm, Abdomen, Left upper leg, Right upper leg, Buttocks, Front perineal area, Left lower leg (including foot) Bathing: 4: Min-Patient completes 8-9 64f10 parts or 75+ percent  FIM - Upper Body Dressing/Undressing Upper body dressing/undressing steps patient completed: Thread/unthread  right sleeve of pullover shirt/dresss, Thread/unthread left sleeve of pullover shirt/dress, Put head through opening of pull over shirt/dress, Pull shirt over trunk Upper body dressing/undressing: 5: Set-up assist to: Obtain clothing/put away FIM - Lower Body Dressing/Undressing Lower body dressing/undressing steps patient completed: Thread/unthread left pants leg, Pull  pants up/down, Don/Doff left sock, Don/Doff left shoe, Fasten/unfasten left shoe Lower body dressing/undressing: 3: Mod-Patient completed 50-74% of tasks  FIM - Toileting Toileting steps completed by patient: Performs perineal hygiene, Adjust clothing after toileting, Adjust clothing prior to toileting Toileting Assistive Devices: Grab bar or rail for support Toileting: 4: Steadying assist  FIM - Radio producer Devices: Insurance account manager Transfers: 4-From toilet/BSC: Min A (steadying Pt. > 75%), 4-To toilet/BSC: Min A (steadying Pt. > 75%)  FIM - Bed/Chair Transfer Bed/Chair Transfer Assistive Devices: Arm rests Bed/Chair Transfer: 2: Bed > Chair or W/C: Max A (lift and lower assist), 3: Chair or W/C > Bed: Mod A (lift or lower assist)  FIM - Locomotion: Wheelchair Distance: 25 Locomotion: Wheelchair: 1: Travels less than 50 ft with minimal assistance (Pt.>75%) FIM - Locomotion: Ambulation Locomotion: Ambulation Assistive Devices: Other (comment) (none) Ambulation/Gait Assistance: 1: +2 Total assist Locomotion: Ambulation: 1: Two helpers  Comprehension Comprehension Mode: Auditory Comprehension: 5-Follows basic conversation/direction: With extra time/assistive device  Expression Expression Mode: Verbal Expression: 4-Expresses basic 75 - 89% of the time/requires cueing 10 - 24% of the time. Needs helper to occlude trach/needs to repeat words.  Social Interaction Social Interaction: 4-Interacts appropriately 75 - 89% of the time - Needs redirection for appropriate language or to initiate  interaction.  Problem Solving Problem Solving Mode: Not assessed Problem Solving: 4-Solves basic 75 - 89% of the time/requires cueing 10 - 24% of the time  Memory Memory: 3-Recognizes or recalls 50 - 74% of the time/requires cueing 25 - 49% of the time  Medical Problem List and Plan: 1. Functional deficits secondary to Left paracentral pontine stroke 2.  DVT Prophylaxis/Anticoagulation: Pharmaceutical: Lovenox 3. Pain Management: Tylenol prn for back pain. Will add K pad additionally.   4. Mood: LCSW to follow for evaluation and support.   5. Neuropsych: This patient is capable of making decisions on his own behalf. 6. Skin/Wound Care: Routine pressure relief measures. Rehab RN to monitor skin daily 7. Fluids/Electrolytes/Nutrition: Monitor I/O. Offer nutritional supplement prn if intake poor.   8. DM type 2 with neuropathy: Will monitor BS with ac/hs checks. Continue lantus and resume metformin as blood sugars poorly controlled.  9.  BPH: Continue proscar. Monitor for any voiding difficulty.    10.  HTN: Will monitor blood pressure tid. Currently controlled off HCTZ.  intake improving, BPs rising will restart low dose 11. GERD: will add Pepcid for symptoms.    LOS (Days) 3 A FACE TO FACE EVALUATION WAS PERFORMED  KIRSTEINS,ANDREW E 11/23/2014, 7:39 AM

## 2014-11-24 ENCOUNTER — Inpatient Hospital Stay (HOSPITAL_COMMUNITY): Payer: Medicare Other | Admitting: *Deleted

## 2014-11-24 ENCOUNTER — Inpatient Hospital Stay (HOSPITAL_COMMUNITY): Payer: Medicare Other | Admitting: Speech Pathology

## 2014-11-24 LAB — URINALYSIS, ROUTINE W REFLEX MICROSCOPIC
Bilirubin Urine: NEGATIVE
Hgb urine dipstick: NEGATIVE
Ketones, ur: 15 mg/dL — AB
Leukocytes, UA: NEGATIVE
Nitrite: NEGATIVE
Protein, ur: NEGATIVE mg/dL
Specific Gravity, Urine: 1.034 — ABNORMAL HIGH (ref 1.005–1.030)
Urobilinogen, UA: 1 mg/dL (ref 0.0–1.0)
pH: 5.5 (ref 5.0–8.0)

## 2014-11-24 LAB — GLUCOSE, CAPILLARY
GLUCOSE-CAPILLARY: 194 mg/dL — AB (ref 70–99)
Glucose-Capillary: 82 mg/dL (ref 70–99)
Glucose-Capillary: 204 mg/dL — ABNORMAL HIGH (ref 70–99)
Glucose-Capillary: 162 mg/dL — ABNORMAL HIGH (ref 70–99)

## 2014-11-24 LAB — URINE MICROSCOPIC-ADD ON

## 2014-11-24 MED ORDER — METFORMIN HCL 850 MG PO TABS
850.0000 mg | ORAL_TABLET | Freq: Two times a day (BID) | ORAL | Status: DC
  Administered 2014-11-24 – 2014-11-25 (×2): 850 mg via ORAL
  Filled 2014-11-24 (×4): qty 1

## 2014-11-24 NOTE — Progress Notes (Signed)
Physical Therapy Session Note  Patient Details  Name: Chad Fuller MRN: [id number] Date of Birth: 01-15-1926  Today's Date: 11/24/2014 PT Individual Time:  13:00-14:00 (34min)    Short Term Goals: Week 1:  PT Short Term Goal 1 (Week 1): pt will perform w/c>< bed transfer with min assist 50% of time PT Short Term Goal 2 (Week 1): pt will propel w/c x 150' with supervision/min assist PT Short Term Goal 3 (Week 1): pt will perform sit>< stand with min assist PT Short Term Goal 4 (Week 1): pt will perform gait with LRAD x 50' with mod assist PT Short Term Goal 5 (Week 1): pt will ascend/descend 5 steps with min assist without impulsivity  Skilled Therapeutic Interventions/Progress Updates:  Tx focused on functional mobility training, gait training, and NMR via forced use, manual facilitation, and verbal cues. Pt up in [facility], ready to go. Pt propelled WC x80' and 1x40' with close S and cues to avoid R wall, which he was able to do. Added theraband to wheel for grip on R.   NMR In // bars, pt was instructed in the following static and dynamic tasks for balance and increased R motor control.  - static standing without UE support - lateral weight shifting without UEs -  Mini-squats without UEs - marching wtih UEs - heel raises with UEs - step taps R/L with UEs (unable without UE support)   Seated HS/heel cord stretching 2x110min with cues   Performed multiple sit<>stand transfers as wella s stand-step with RW;  Min A overall and cues for using UEs for controlling descent as well as safety during turns with RW positioning. Pt somewhat impulsive with transfers and little carryover for safety noted.   Gait training with RW 2x75' with Mod A overall and cues for upright trunk and upward gaze as well as R foot clearance and heel strike. Performance improved with practice and cues for technique. Pt may benefit from AFO trial with shoes.   Stair training with L rail only and up to Mod A for safely  lowering x4 with sequencing cues.  Pt left up in [facility] with lap belt and all needs in reach.      Therapy Documentation Precautions:  Precautions Precautions: Fall Precaution Comments: multiple falls at home; stated he falls up or down entry steps at home about once per month, due to "not knowing where his feet are" Restrictions Weight Bearing Restrictions: No   Pain: Pain Assessment Pain Assessment: No/denies pain  See FIM for current functional status  Therapy/Group: Individual Therapy  Kennieth Rad, PT, DPT  11/24/2014, 1:22 PM

## 2014-11-24 NOTE — Progress Notes (Signed)
Speech Language Pathology Daily Session Note  Patient Details  Name: Chad Fuller MRN: [id number] Date of Birth: 1926/04/10  Today's Date: 11/24/2014 SLP Individual Time: 1000-1100 SLP Individual Time Calculation (min): 60 min  Short Term Goals: Week 1: SLP Short Term Goal 1 (Week 1): Pt will tolerate presentations of his curently prescribed diet with no overt s/s of aspiration with supervision cues for use of swallowing precautions.  SLP Short Term Goal 2 (Week 1): Pt will improve functional problem solving for basic tasks for 75% accuracy with mod assist.  SLP Short Term Goal 3 (Week 1): Pt will improve use of compensatory strategies to facilitate recall of new information for 75% accuracy with mod assist  SLP Short Term Goal 4 (Week 1): Pt will improve speech intelligibility at the conversational level to >90% with min cues for use of dysarthria strategies.  SLP Short Term Goal 5 (Week 1): Pt will complete pharyngeal and oral motor strengthening exercises to improve right sided weakness and overall swallowing function with min cues   Skilled Therapeutic Interventions: Skilled treatment session focused on addressing speech intelligibility and dysphagia goals.  SLP facilitated session with verbal education regarding speech intelligibility strategies, which patient required Max cues to utilize at the sentence level of verbal expression.  SLP left a handout up in patient's room to assist with recall compensatory strategies.  SLP also facilitated session with education regarding rationale for and how to properly thicken liquids to a nectar-thick consistency, due to patient having a cup of thin ice water on bedside table.  Patient required frequent reinforcement of rationale for while he was consuming nectar-thick liquids via teaspoon as he continued asking for thin water because it tasted better.  Patient demonstrated no overt s/s of aspiration throughout trials.  Continue with current plan of  care.    FIM:  Comprehension Comprehension Mode: Auditory Comprehension: 5-Understands basic 90% of the time/requires cueing < 10% of the time Expression Expression Mode: Verbal Expression: 4-Expresses basic 75 - 89% of the time/requires cueing 10 - 24% of the time. Needs helper to occlude trach/needs to repeat words. Social Interaction Social Interaction: 4-Interacts appropriately 75 - 89% of the time - Needs redirection for appropriate language or to initiate interaction. Problem Solving Problem Solving: 3-Solves basic 50 - 74% of the time/requires cueing 25 - 49% of the time Memory Memory: 3-Recognizes or recalls 50 - 74% of the time/requires cueing 25 - 49% of the time FIM - Eating Eating Activity: 5: Needs verbal cues/supervision  Pain Pain Assessment Pain Assessment: No/denies pain  Therapy/Group: Individual Therapy  Carmelia Roller., CCC-SLP [phone]  [place] 11/24/2014, 1:03 PM

## 2014-11-24 NOTE — Plan of Care (Signed)
Problem: RH BLADDER ELIMINATION Goal: RH STG MANAGE BLADDER WITH ASSISTANCE STG Manage Bladder With Assistance. Mod I  Outcome: Progressing  Problem: RH SKIN INTEGRITY Goal: RH STG SKIN FREE OF INFECTION/BREAKDOWN Skin to remain free from infection/breakdown while on rehab with min assist  Outcome: Progressing  Problem: RH SAFETY Goal: RH STG ADHERE TO SAFETY PRECAUTIONS W/ASSISTANCE/DEVICE STG Adhere to Safety Precautions With Assistance/Device. Supervision  Outcome: Progressing  Problem: RH PAIN MANAGEMENT Goal: RH STG PAIN MANAGED AT OR BELOW PT'S PAIN GOAL <4  Outcome: Progressing

## 2014-11-24 NOTE — Plan of Care (Signed)
Problem: RH BOWEL ELIMINATION Goal: RH STG MANAGE BOWEL WITH ASSISTANCE STG Manage Bowel with Assistance. Mod I  Outcome: Progressing Goal: RH STG MANAGE BOWEL W/MEDICATION W/ASSISTANCE STG Manage Bowel with Medication with min Assistance.  Outcome: Progressing  Problem: RH BLADDER ELIMINATION Goal: RH STG MANAGE BLADDER WITH ASSISTANCE STG Manage Bladder With Assistance. Mod I  Outcome: Progressing  Problem: RH SKIN INTEGRITY Goal: RH STG SKIN FREE OF INFECTION/BREAKDOWN Skin to remain free from infection/breakdown while on rehab with min assist  Outcome: Progressing  Problem: RH SAFETY Goal: RH STG ADHERE TO SAFETY PRECAUTIONS W/ASSISTANCE/DEVICE STG Adhere to Safety Precautions With Assistance/Device. Supervision  Outcome: Progressing  Problem: RH PAIN MANAGEMENT Goal: RH STG PAIN MANAGED AT OR BELOW PT'S PAIN GOAL <4  Outcome: Progressing

## 2014-11-24 NOTE — Progress Notes (Signed)
Occupational Therapy Session Note  Patient Details  Name: Chad Fuller MRN: [id number] Date of Birth: Sep 07, 1926  Today's Date: 11/24/2014 OT Individual Time:  -   [id number]  (60 min)      Short Term Goals: Week 1:  OT Short Term Goal 1 (Week 1): Pt will ambulate to toilet with close S with RW. OT Short Term Goal 2 (Week 1): Pt will don his pants over his R foot. OT Short Term Goal 3 (Week 1): Pt will don sock and shoe on R foot.  OT Short Term Goal 4 (Week 1): Pt will stand at sink to groom with min A. Week 2:     Skilled Therapeutic Interventions/Progress Updates:    Tx focused on trunk stability, NMR, functional mobility, transfers, and functional mobility with RW.  Pt seen for BADL retraining with a focus on, balance, aincreased wareness of feet stability and positioning when transferring or walking.   Pt in chair and ambulated with RW with min A to toilet.  Pt. Undressed while sitting on toilet seat.  Ambulated to shower.  Needed minimal assist with transfer and minimal assist for body positioning on bench.  Pt. Stood with questioning cues to ensure foot placement and stability.  Pt. amublated out to wc with minimal assist and completed grooming at sink.  Left pt in wc with safety belt on and call bell,phone within reach.      Therapy Documentation Precautions:  Precautions Precautions: Fall Precaution Comments: multiple falls at home; stated he falls up or down entry steps at home about once per month, due to "not knowing where his feet are" Restrictions Weight Bearing Restrictions: No      Pain:  none   ADL: ADL ADL Comments: Refer to FIM    :    See FIM for current functional status  Therapy/Group: Individual Therapy  Lisa Roca 11/24/2014, 8:06 AM

## 2014-11-24 NOTE — Progress Notes (Signed)
   11/24/14 1635  What Happened  Was fall witnessed? No  Patients activity before fall bathroom-unassisted  Point of contact buttocks (per patient)  Was patient injured? No  Patient found in bathroom;on floor  Found by Staff-comment Ivin Booty NT)  Stated prior activity bathroom-unassisted  Follow Up  MD notified Naaman Plummer MD  Time MD notified 1640  Family notified Yes-comment (Daughter notified)  Time family notified 1635  Additional tests No  Adult Fall Risk Assessment  Risk Factor Category (scoring not indicated) History of more than one fall within 6 months before admission (document High fall risk);Fall has occurred during this admission (document High fall risk);High fall risk per protocol (document High fall risk)  Patient's Fall Risk High Fall Risk (>13 points)  Adult Fall Risk Interventions  Required Bundle Interventions *See Row Information* High fall risk - low, moderate, and high requirements implemented  Additional Interventions Fall risk signage;Other (Comment) (Patient to now be brought to nurses station when alon)  Pain Assessment  Pain Assessment No/denies pain  PCA/Epidural/Spinal Assessment  Respiratory Pattern Regular  Neurological  Neuro (WDL) WDL  Level of Consciousness Alert  Orientation Level Oriented X4  Cognition Follows commands;Memory impairment;Poor attention/concentration  Speech Clear  Pupil Assessment  No  Facial Symmetry Asymmetrical right  R Hand Grip Present;Moderate  L Hand Grip Present;Moderate   RUE Motor Response Purposeful movement  RUE Sensation Full sensation  RUE Motor Strength 4  LUE Motor Response Purposeful movement  LUE Sensation Full sensation  LUE Motor Strength 5  RLE Motor Response Purposeful movement  RLE Sensation Full sensation  RLE Motor Strength 4  LLE Motor Response Purposeful movement  LLE Sensation Full sensation  LLE Motor Strength 4  Neuro Symptoms None  Integumentary  Integumentary (WDL) X  Skin Color  Appropriate for ethnicity  Skin Condition Dry  Skin Integrity Ecchymosis;Abrasion (no new skin issues)  Abrasion Location Back  Abrasion Location Orientation Right

## 2014-11-24 NOTE — Progress Notes (Signed)
80 year old male with history of DM type 2 with neuropathy, PE, NSCLC s/p resection, CAD who was admitted on 11/18/14 with slurred speech, dizziness and difficulty walking with fall. CT head without acute changes, generalized atrophy and right forehead contusion. CT cervical spine with stenosis C3/4 to C 5/6. MRI/MRA brain with Small acute brainstem infarct in the left paracentral pons and no significant stenosis.  2D echo with EF 60%, AV sclerosis and mitral annular calcification without stenosis. Carotid dopplers without significant stenosis. Neurology recommended ASA for thrombotic stroke due to SVD. Speech therapy evaluation with dysphagia and FEES done  Subjective/Complaints: Complains of dysuria.  Review of Systems - Negative except as above Objective: Vital Signs: Blood pressure 141/85, pulse 84, temperature 98.8 F (37.1 C), temperature source Oral, resp. rate 17, weight 104.282 kg (229 lb 14.4 oz), SpO2 96 %. No results found. Results for orders placed or performed during the hospital encounter of 11/20/14 (from the past 72 hour(s))  Glucose, capillary     Status: Abnormal   Collection Time: 11/21/14 11:19 AM  Result Value Ref Range   Glucose-Capillary 221 (H) 70 - 99 mg/dL   Comment 1 Notify RN   Glucose, capillary     Status: Abnormal   Collection Time: 11/21/14  4:32 PM  Result Value Ref Range   Glucose-Capillary 262 (H) 70 - 99 mg/dL  Glucose, capillary     Status: Abnormal   Collection Time: 11/21/14  9:00 PM  Result Value Ref Range   Glucose-Capillary 252 (H) 70 - 99 mg/dL  Glucose, capillary     Status: None   Collection Time: 11/22/14  6:58 AM  Result Value Ref Range   Glucose-Capillary 89 70 - 99 mg/dL  Glucose, capillary     Status: Abnormal   Collection Time: 11/22/14 11:37 AM  Result Value Ref Range   Glucose-Capillary 268 (H) 70 - 99 mg/dL   Comment 1 Notify RN   Glucose, capillary     Status: Abnormal   Collection Time: 11/22/14  4:12 PM  Result Value Ref  Range   Glucose-Capillary 226 (H) 70 - 99 mg/dL   Comment 1 Notify RN   Glucose, capillary     Status: Abnormal   Collection Time: 11/22/14  9:23 PM  Result Value Ref Range   Glucose-Capillary 186 (H) 70 - 99 mg/dL  Glucose, capillary     Status: Abnormal   Collection Time: 11/23/14  4:18 AM  Result Value Ref Range   Glucose-Capillary 150 (H) 70 - 99 mg/dL  Glucose, capillary     Status: Abnormal   Collection Time: 11/23/14  6:39 AM  Result Value Ref Range   Glucose-Capillary 157 (H) 70 - 99 mg/dL  Glucose, capillary     Status: Abnormal   Collection Time: 11/23/14  7:07 AM  Result Value Ref Range   Glucose-Capillary 146 (H) 70 - 99 mg/dL  Glucose, capillary     Status: Abnormal   Collection Time: 11/23/14 11:14 AM  Result Value Ref Range   Glucose-Capillary 180 (H) 70 - 99 mg/dL   Comment 1 Notify RN   Glucose, capillary     Status: Abnormal   Collection Time: 11/23/14  4:30 PM  Result Value Ref Range   Glucose-Capillary 218 (H) 70 - 99 mg/dL   Comment 1 Notify RN   Glucose, capillary     Status: Abnormal   Collection Time: 11/23/14  9:19 PM  Result Value Ref Range   Glucose-Capillary 249 (H) 70 - 99  mg/dL  Glucose, capillary     Status: None   Collection Time: 11/24/14  6:53 AM  Result Value Ref Range   Glucose-Capillary 82 70 - 99 mg/dL   Comment 1 Notify RN      HEENT: normal Cardio: RRR and no murmurs Resp: CTA B/L and unlabored GI: BS positive and NT,ND Extremity:  Pulses positive and No Edema Skin:   Intact and Wound R forehead ecchymosis, faint, non tender Neuro: Alert/Oriented, Abnormal Sensory decreased LT sensation bilateral feet, Abnormal Motor 3- R delt, bi, tri, grip, HF, KE, ADF and Abnormal FMC Ataxic/ dec FMC, speech dysarthric, right facial droop Musc/Skel:  Other tightness bilateral hamstrings Gen NAD   Assessment/Plan: 1. 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 Physiatrist is providing close team supervision and 24 hour management of active medical problems listed below. Physiatrist and rehab team continue to assess barriers to discharge/monitor patient progress toward functional and medical goals. FIM: FIM - Bathing Bathing Steps Patient Completed: Chest, Right Arm, Left Arm, Abdomen, Left upper leg, Right upper leg, Buttocks, Front perineal area, Left lower leg (including foot), Right lower leg (including foot) Bathing: 4: Steadying assist  FIM - Upper Body Dressing/Undressing Upper body dressing/undressing steps patient completed: Thread/unthread right sleeve of pullover shirt/dresss, Thread/unthread left sleeve of pullover shirt/dress, Put head through opening of pull over shirt/dress, Pull shirt over trunk Upper body dressing/undressing: 5: Set-up assist to: Obtain clothing/put away FIM - Lower Body Dressing/Undressing Lower body dressing/undressing steps patient completed: Thread/unthread left pants leg, Pull pants up/down, Don/Doff left sock, Don/Doff left shoe, Fasten/unfasten left shoe, Thread/unthread right pants leg Lower body dressing/undressing: 3: Mod-Patient completed 50-74% of tasks  FIM - Toileting Toileting steps completed by patient: Adjust clothing prior to toileting, Performs perineal hygiene, Adjust clothing after toileting Toileting Assistive Devices: Grab bar or rail for support Toileting: 6: Assistive device: No helper  FIM - Radio producer Devices: Environmental consultant, Product manager Transfers: 4-From toilet/BSC: Min A (steadying Pt. > 75%), 4-To toilet/BSC: Min A (steadying Pt. > 75%)  FIM - Bed/Chair Transfer Bed/Chair Transfer Assistive Devices: Arm rests Bed/Chair Transfer: 3: Bed > Chair or W/C: Mod A (lift or lower assist), 3: Chair or W/C > Bed: Mod A (lift or lower assist)  FIM - Locomotion: Wheelchair Distance:  150 Locomotion: Wheelchair: 5: Travels 150 ft or more: maneuvers on rugs and over door sills with supervision, cueing or coaxing FIM - Locomotion: Ambulation Locomotion: Ambulation Assistive Devices: Administrator Ambulation/Gait Assistance: 3: Mod assist Locomotion: Ambulation: 2: Travels 50 - 149 ft with moderate assistance (Pt: 50 - 74%)  Comprehension Comprehension Mode: Auditory Comprehension: 5-Follows basic conversation/direction: With extra time/assistive device  Expression Expression Mode: Verbal Expression: 5-Expresses basic needs/ideas: With extra time/assistive device  Social Interaction Social Interaction: 4-Interacts appropriately 75 - 89% of the time - Needs redirection for appropriate language or to initiate interaction.  Problem Solving Problem Solving Mode: Not assessed Problem Solving: 4-Solves basic 75 - 89% of the time/requires cueing 10 - 24% of the time  Memory Memory: 3-Recognizes or recalls 50 - 74% of the time/requires cueing 25 - 49% of the time  Medical Problem List and Plan: 1. Functional deficits secondary to Left paracentral pontine stroke 2.  DVT Prophylaxis/Anticoagulation: Pharmaceutical: Lovenox 3. Pain Management: Tylenol prn for back pain. Will add K pad additionally.   4. Mood: LCSW  to follow for evaluation and support.   5. Neuropsych: This patient is capable of making decisions on his own behalf. 6. Skin/Wound Care: Routine pressure relief measures. Rehab RN to monitor skin daily 7. Fluids/Electrolytes/Nutrition: Monitor I/O. Offer nutritional supplement prn if intake poor.   8. DM type 2 with neuropathy: Will monitor BS with ac/hs checks. Continue lantus and resume metformin as blood sugars poorly controlled.  9.  BPH: Continue proscar. Monitor for any voiding difficulty.    10.  HTN: Will monitor blood pressure tid. Currently controlled off HCTZ.  intake improving, BPs rising will restart low dose 11. GERD: will add Pepcid for symptoms.   12. Uro: check ua/cx   LOS (Days) 4 A FACE TO FACE EVALUATION WAS PERFORMED  Chad Fuller 11/24/2014, 8:44 AM

## 2014-11-25 ENCOUNTER — Inpatient Hospital Stay (HOSPITAL_COMMUNITY): Payer: Medicare Other | Admitting: *Deleted

## 2014-11-25 ENCOUNTER — Inpatient Hospital Stay (HOSPITAL_COMMUNITY): Payer: Medicare Other | Admitting: Physical Therapy

## 2014-11-25 DIAGNOSIS — E119 Type 2 diabetes mellitus without complications: Secondary | ICD-10-CM

## 2014-11-25 LAB — GLUCOSE, CAPILLARY
Glucose-Capillary: 69 mg/dL — ABNORMAL LOW (ref 70–99)
Glucose-Capillary: 82 mg/dL (ref 70–99)
Glucose-Capillary: 75 mg/dL (ref 70–99)
Glucose-Capillary: 115 mg/dL — ABNORMAL HIGH (ref 70–99)
Glucose-Capillary: 142 mg/dL — ABNORMAL HIGH (ref 70–99)
Glucose-Capillary: 196 mg/dL — ABNORMAL HIGH (ref 70–99)

## 2014-11-25 LAB — URINE CULTURE: Colony Count: 2000

## 2014-11-25 MED ORDER — METFORMIN HCL 850 MG PO TABS
850.0000 mg | ORAL_TABLET | Freq: Every day | ORAL | Status: DC
  Administered 2014-11-26 – 2014-12-03 (×8): 850 mg via ORAL
  Filled 2014-11-25 (×9): qty 1

## 2014-11-25 MED ORDER — METFORMIN HCL 500 MG PO TABS
500.0000 mg | ORAL_TABLET | Freq: Every day | ORAL | Status: DC
  Administered 2014-11-25 – 2014-11-27 (×3): 500 mg via ORAL
  Filled 2014-11-25 (×4): qty 1

## 2014-11-25 NOTE — Significant Event (Signed)
Hypoglycemic Event  CBG: 69  Treatment: 15 GM carbohydrate snack  Symptoms: Sweaty  Follow-up CBG: [id number] CBG Result:82  Possible Reasons for Event: Unknown  Comments/MD notified:Pt called out to the nurses station stating his sugar was low, CBG check confirmed. Orange juice was given and rechecked CBG in about 15 min with improved results, 82. Pt has complains of being weak and requested an additional snack, applesauce. Pt reports feeling much better. Will cont to monitor.    Chad Fuller, Dione Plover  Remember to initiate Hypoglycemia Order Set & complete

## 2014-11-25 NOTE — Progress Notes (Signed)
Occupational Therapy Session Note  Patient Details  Name: Chad Fuller MRN: [id number] Date of Birth: 07-Jul-1926  Today's Date: 11/25/2014 OT Individual Time:  - 1100-1210  (70 min)                                         1600-1700  (60 min)      Short Term Goals: Week 1:  OT Short Term Goal 1 (Week 1): Pt will ambulate to toilet with close S with RW. OT Short Term Goal 2 (Week 1): Pt will don his pants over his R foot. OT Short Term Goal 3 (Week 1): Pt will don sock and shoe on R foot.  OT Short Term Goal 4 (Week 1): Pt will stand at sink to groom with min A. Week 2:     Skilled Therapeutic Interventions/Progress Updates:    1st session:  Tx focused on trunk stability, NMR, functional mobility, transfers, and functional mobility with RW sit to stand.  Pt. Sitting up at nursing station due to fall on Nov 28. Pt seen for BADL retraining with a focus on, balance, increased wareness of feet stability and positioning when transferring or walking.  Ptambulated with RW with min A to shower seat.   Pt. Undressed while sitting  With cues to let therapist know when about to stand..  Needed minimal assist with transfer and minimal assist for body positioning on bench. Pt. Stood with questioning cues to ensure foot placement and stability. Pt. amublated out to wc with minimal assist and completed.dressing and grooming at sink.Pt needed verbal reminder to dress RLE first due to weakness.   Left pt in wc with safety belt on at nursing station.   Therapy Documentation Precautions:  Precautions Precautions: Fall Precaution Comments: multiple falls at home; stated he falls up or down entry steps at home about once per month, due to "not knowing where his feet are" Restrictions Weight Bearing Restrictions: No     Pain:   1st session:   none             2nd session:  none ADL: ADL ADL Comments: Refer to FIM    Other Treatments:    Time:  1600-1700  (60 min) Pain:none group session:   Engaged in functional mobility, balance, sit to stand.  Pt. Needed mod cues for correct positioning when going from sit to stand.  He is at minimal assist with this and required min verbal cues.  He ambulated about 75 feet x4.  Engaged in game of horseshoes for balance.  Family members present during session.  Applied safety belt at end of session.  Tech transported him back to his room.     See FIM for current functional status  Therapy/Group: Individual Therapy  Lisa Roca 11/25/2014, 11:10 AM

## 2014-11-25 NOTE — Plan of Care (Signed)
Problem: RH BOWEL ELIMINATION Goal: RH STG MANAGE BOWEL WITH ASSISTANCE STG Manage Bowel with Assistance. Mod I  Outcome: Progressing Goal: RH STG MANAGE BOWEL W/MEDICATION W/ASSISTANCE STG Manage Bowel with Medication with min Assistance.  Outcome: Progressing  Problem: RH BLADDER ELIMINATION Goal: RH STG MANAGE BLADDER WITH ASSISTANCE STG Manage Bladder With Assistance. Mod I  Outcome: Progressing  Problem: RH SKIN INTEGRITY Goal: RH STG SKIN FREE OF INFECTION/BREAKDOWN Skin to remain free from infection/breakdown while on rehab with min assist  Outcome: Progressing  Problem: RH SAFETY Goal: RH STG ADHERE TO SAFETY PRECAUTIONS W/ASSISTANCE/DEVICE STG Adhere to Safety Precautions With Assistance/Device. Supervision  Outcome: Progressing  Problem: RH PAIN MANAGEMENT Goal: RH STG PAIN MANAGED AT OR BELOW PT'S PAIN GOAL <4  Outcome: Progressing

## 2014-11-25 NOTE — Progress Notes (Signed)
80 year old male with history of DM type 2 with neuropathy, PE, NSCLC s/p resection, CAD who was admitted on 11/18/14 with slurred speech, dizziness and difficulty walking with fall. CT head without acute changes, generalized atrophy and right forehead contusion. CT cervical spine with stenosis C3/4 to C 5/6. MRI/MRA brain with Small acute brainstem infarct in the left paracentral pons and no significant stenosis.  2D echo with EF 60%, AV sclerosis and mitral annular calcification without stenosis. Carotid dopplers without significant stenosis. Neurology recommended ASA for thrombotic stroke due to SVD. Speech therapy evaluation with dysphagia and FEES done  Subjective/Complaints: Fell in bathroom yesterday fortunately without sequelae. Continued poor safety awareness, cbg's low this am Review of Systems - Negative except as above Objective: Vital Signs: Blood pressure 148/76, pulse 78, temperature 98.1 F (36.7 C), temperature source Oral, resp. rate 18, weight 104.282 kg (229 lb 14.4 oz), SpO2 97 %. No results found. Results for orders placed or performed during the hospital encounter of 11/20/14 (from the past 72 hour(s))  Glucose, capillary     Status: Abnormal   Collection Time: 11/22/14 11:37 AM  Result Value Ref Range   Glucose-Capillary 268 (H) 70 - 99 mg/dL   Comment 1 Notify RN   Glucose, capillary     Status: Abnormal   Collection Time: 11/22/14  4:12 PM  Result Value Ref Range   Glucose-Capillary 226 (H) 70 - 99 mg/dL   Comment 1 Notify RN   Glucose, capillary     Status: Abnormal   Collection Time: 11/22/14  9:23 PM  Result Value Ref Range   Glucose-Capillary 186 (H) 70 - 99 mg/dL  Glucose, capillary     Status: Abnormal   Collection Time: 11/23/14  4:18 AM  Result Value Ref Range   Glucose-Capillary 150 (H) 70 - 99 mg/dL  Glucose, capillary     Status: Abnormal   Collection Time: 11/23/14  6:39 AM  Result Value Ref Range   Glucose-Capillary 157 (H) 70 - 99 mg/dL   Glucose, capillary     Status: Abnormal   Collection Time: 11/23/14  7:07 AM  Result Value Ref Range   Glucose-Capillary 146 (H) 70 - 99 mg/dL  Glucose, capillary     Status: Abnormal   Collection Time: 11/23/14 11:14 AM  Result Value Ref Range   Glucose-Capillary 180 (H) 70 - 99 mg/dL   Comment 1 Notify RN   Glucose, capillary     Status: Abnormal   Collection Time: 11/23/14  4:30 PM  Result Value Ref Range   Glucose-Capillary 218 (H) 70 - 99 mg/dL   Comment 1 Notify RN   Glucose, capillary     Status: Abnormal   Collection Time: 11/23/14  9:19 PM  Result Value Ref Range   Glucose-Capillary 249 (H) 70 - 99 mg/dL  Glucose, capillary     Status: None   Collection Time: 11/24/14  6:53 AM  Result Value Ref Range   Glucose-Capillary 82 70 - 99 mg/dL   Comment 1 Notify RN   Glucose, capillary     Status: Abnormal   Collection Time: 11/24/14 11:21 AM  Result Value Ref Range   Glucose-Capillary 194 (H) 70 - 99 mg/dL   Comment 1 Notify RN   Urinalysis, Routine w reflex microscopic     Status: Abnormal   Collection Time: 11/24/14  3:06 PM  Result Value Ref Range   Color, Urine YELLOW YELLOW   APPearance CLEAR CLEAR   Specific Gravity, Urine 1.034 (H)  1.005 - 1.030   pH 5.5 5.0 - 8.0   Glucose, UA >1000 (A) NEGATIVE mg/dL   Hgb urine dipstick NEGATIVE NEGATIVE   Bilirubin Urine NEGATIVE NEGATIVE   Ketones, ur 15 (A) NEGATIVE mg/dL   Protein, ur NEGATIVE NEGATIVE mg/dL   Urobilinogen, UA 1.0 0.0 - 1.0 mg/dL   Nitrite NEGATIVE NEGATIVE   Leukocytes, UA NEGATIVE NEGATIVE  Urine microscopic-add on     Status: None   Collection Time: 11/24/14  3:06 PM  Result Value Ref Range   Squamous Epithelial / LPF RARE RARE  Glucose, capillary     Status: Abnormal   Collection Time: 11/24/14  4:33 PM  Result Value Ref Range   Glucose-Capillary 204 (H) 70 - 99 mg/dL   Comment 1 Notify RN   Glucose, capillary     Status: Abnormal   Collection Time: 11/24/14  8:43 PM  Result Value Ref Range    Glucose-Capillary 162 (H) 70 - 99 mg/dL  Glucose, capillary     Status: Abnormal   Collection Time: 11/25/14  4:19 AM  Result Value Ref Range   Glucose-Capillary 69 (L) 70 - 99 mg/dL  Glucose, capillary     Status: None   Collection Time: 11/25/14  4:37 AM  Result Value Ref Range   Glucose-Capillary 82 70 - 99 mg/dL  Glucose, capillary     Status: None   Collection Time: 11/25/14  7:20 AM  Result Value Ref Range   Glucose-Capillary 75 70 - 99 mg/dL     HEENT: normal Cardio: RRR and no murmurs Resp: CTA B/L and unlabored GI: BS positive and NT,ND Extremity:  Pulses positive and No Edema Skin:   Intact and Wound R forehead ecchymosis, faint, non tender Neuro: Alert/Oriented, Abnormal Sensory decreased LT sensation bilateral feet, Abnormal Motor 3- R delt, bi, tri, grip, HF, KE, ADF and Abnormal FMC Ataxic/ dec FMC, speech dysarthric, right facial droop. Impulsive, decreased insight and awareness Musc/Skel:  Other tightness bilateral hamstrings Gen NAD   Assessment/Plan: 1. 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 Physiatrist is providing close team supervision and 24 hour management of active medical problems listed below. Physiatrist and rehab team continue to assess barriers to discharge/monitor patient progress toward functional and medical goals. FIM: FIM - Bathing Bathing Steps Patient Completed: Chest, Right Arm, Left Arm, Abdomen, Left upper leg, Right upper leg, Buttocks, Front perineal area, Left lower leg (including foot), Right lower leg (including foot) Bathing: 4: Steadying assist  FIM - Upper Body Dressing/Undressing Upper body dressing/undressing steps patient completed: Thread/unthread right sleeve of pullover shirt/dresss, Thread/unthread left sleeve of pullover shirt/dress, Put head through opening of pull over  shirt/dress, Pull shirt over trunk Upper body dressing/undressing: 5: Set-up assist to: Obtain clothing/put away FIM - Lower Body Dressing/Undressing Lower body dressing/undressing steps patient completed: Thread/unthread left pants leg, Pull pants up/down, Don/Doff left sock, Don/Doff left shoe, Fasten/unfasten left shoe, Thread/unthread right pants leg Lower body dressing/undressing: 3: Mod-Patient completed 50-74% of tasks  FIM - Toileting Toileting steps completed by patient: Adjust clothing prior to toileting, Performs perineal hygiene, Adjust clothing after toileting Toileting Assistive Devices: Grab bar or rail for support Toileting: 4: Steadying assist  FIM - Radio producer Devices: Environmental consultant, Product manager Transfers: 4-From toilet/BSC: Min A (steadying Pt. > 75%), 4-To toilet/BSC: Min A (steadying Pt. > 75%)  FIM - Bed/Chair Transfer  Bed/Chair Transfer Assistive Devices: Environmental consultant, Arm rests Bed/Chair Transfer: 3: Bed > Chair or W/C: Mod A (lift or lower assist), 3: Chair or W/C > Bed: Mod A (lift or lower assist)  FIM - Locomotion: Wheelchair Distance: 75 Locomotion: Wheelchair: 2: Travels 50 - 149 ft with supervision, cueing or coaxing FIM - Locomotion: Ambulation Locomotion: Ambulation Assistive Devices: Administrator Ambulation/Gait Assistance: 3: Mod assist Locomotion: Ambulation: 2: Travels 50 - 149 ft with moderate assistance (Pt: 50 - 74%)  Comprehension Comprehension Mode: Auditory Comprehension: 4-Understands basic 75 - 89% of the time/requires cueing 10 - 24% of the time  Expression Expression Mode: Verbal Expression: 4-Expresses basic 75 - 89% of the time/requires cueing 10 - 24% of the time. Needs helper to occlude trach/needs to repeat words.  Social Interaction Social Interaction: 4-Interacts appropriately 75 - 89% of the time - Needs redirection for appropriate language or to initiate interaction.  Problem Solving Problem  Solving Mode: Not assessed Problem Solving: 3-Solves basic 50 - 74% of the time/requires cueing 25 - 49% of the time  Memory Memory: 3-Recognizes or recalls 50 - 74% of the time/requires cueing 25 - 49% of the time  Medical Problem List and Plan: 1. Functional deficits secondary to Left paracentral pontine stroke 2.  DVT Prophylaxis/Anticoagulation: Pharmaceutical: Lovenox 3. Pain Management: Tylenol prn for back pain. Will add K pad additionally.   4. Mood: LCSW to follow for evaluation and support.   5. Neuropsych: This patient is capable of making decisions on his own behalf.  -waist belt in wheelchair, up at nurses station prn for safety 6. Skin/Wound Care: Routine pressure relief measures. Rehab RN to monitor skin daily 7. Fluids/Electrolytes/Nutrition: Monitor I/O. Offer nutritional supplement prn if intake poor.IVF   8. DM type 2 with neuropathy: Will monitor BS with ac/hs checks. Continue lantus and resumed metformin  -continue 850 metformin in am and reduce pm dose back down to 500 9.  BPH: Continue proscar. Monitor for any voiding difficulty.    10.  HTN: Will monitor blood pressure tid. Currently controlled off HCTZ.  intake improving, BPs rising will restart low dose 11. GERD: will add Pepcid for symptoms.  12. Uro: check ua negative, culture pending   LOS (Days) 5 A FACE TO FACE EVALUATION WAS PERFORMED  Chad Fuller T 11/25/2014, 8:11 AM

## 2014-11-25 NOTE — Progress Notes (Signed)
Physical Therapy Session Note  Patient Details  Name: Chad Fuller MRN: [id number] Date of Birth: 1926-01-08  Today's Date: 11/25/2014 PT Individual Time: 0800-0900 PT Individual Time Calculation (min): 60 min   Short Term Goals: Week 1:  PT Short Term Goal 1 (Week 1): pt will perform w/c>< bed transfer with min assist 50% of time PT Short Term Goal 2 (Week 1): pt will propel w/c x 150' with supervision/min assist PT Short Term Goal 3 (Week 1): pt will perform sit>< stand with min assist PT Short Term Goal 4 (Week 1): pt will perform gait with LRAD x 50' with mod assist PT Short Term Goal 5 (Week 1): pt will ascend/descend 5 steps with min assist without impulsivity Week 2:    Week 3:    Week 4:     Skilled Therapeutic Interventions/Progress Updates:    Pt fatigues as session progresses with increased weightbearing and standing tasks, requiring more assist in last 10 minutes of session. Pt with proximal mobility and stability deficits limiting safety in functional mobility. Pt with difficulty tolerating tall kneel position secondary to HS spasm likely due to decreased glute activation.  Therapy Documentation Precautions:  Precautions Precautions: Fall Precaution Comments: multiple falls at home; stated he falls up or down entry steps at home about once per month, due to "not knowing where his feet are" Restrictions Weight Bearing Restrictions: No General:   Pain:  None reported Mobility:  Min A for transfers with cues for hand placement, sequencing, safety, and  attention Locomotion : Ambulation Ambulation/Gait Assistance: 3: Mod assist 125'x3, 15'x2 Wheelchair Mobility Distance: 150  Other Treatments:  Pt performs anterior weight shifts, hip ER AROM 2x10. Pt performs dynamic and static sitting and standing balance tasks with reaching, grasping, and weight shifting activities within functional context. Pt educated on rehab plan. Transfers x15 in session. Pt attains and  maintains prone, quadraped and tall kneel positions. Pt performs static standing balance 2'x3. Pre-gait 3x10 BLE advancement without AD. Thoracic mobs and hip mobs performed. [place] stretch with talocrural mobs performed 3x30".   See FIM for current functional status  Therapy/Group: Individual Therapy  Monia Pouch 11/25/2014, 8:49 AM

## 2014-11-25 NOTE — Plan of Care (Signed)
Problem: RH BOWEL ELIMINATION Goal: RH STG MANAGE BOWEL WITH ASSISTANCE STG Manage Bowel with Assistance. Mod I  Outcome: Progressing Goal: RH STG MANAGE BOWEL W/MEDICATION W/ASSISTANCE STG Manage Bowel with Medication with min Assistance.  Outcome: Progressing  Problem: RH BLADDER ELIMINATION Goal: RH STG MANAGE BLADDER WITH ASSISTANCE STG Manage Bladder With Assistance. Mod I  Outcome: Progressing  Problem: RH SKIN INTEGRITY Goal: RH STG SKIN FREE OF INFECTION/BREAKDOWN Skin to remain free from infection/breakdown while on rehab with min assist  Outcome: Progressing  Problem: RH SAFETY Goal: RH STG ADHERE TO SAFETY PRECAUTIONS W/ASSISTANCE/DEVICE STG Adhere to Safety Precautions With Assistance/Device. Supervision  Outcome: Progressing  Problem: RH PAIN MANAGEMENT Goal: RH STG PAIN MANAGED AT OR BELOW PT'S PAIN GOAL <4  Outcome: Progressing  Problem: RH KNOWLEDGE DEFICIT Goal: RH STG INCREASE KNOWLEDGE OF DIABETES Patient will state s/s of hypo/hyperglycemia, dietary and medication management with mod assist  Outcome: Progressing

## 2014-11-26 ENCOUNTER — Inpatient Hospital Stay (HOSPITAL_COMMUNITY): Payer: Medicare Other | Admitting: Speech Pathology

## 2014-11-26 ENCOUNTER — Inpatient Hospital Stay (HOSPITAL_COMMUNITY): Payer: Medicare Other

## 2014-11-26 ENCOUNTER — Encounter (HOSPITAL_COMMUNITY): Payer: Medicare Other | Admitting: Occupational Therapy

## 2014-11-26 LAB — GLUCOSE, CAPILLARY
GLUCOSE-CAPILLARY: 65 mg/dL — AB (ref 70–99)
GLUCOSE-CAPILLARY: 143 mg/dL — AB (ref 70–99)
Glucose-Capillary: 56 mg/dL — ABNORMAL LOW (ref 70–99)
Glucose-Capillary: 93 mg/dL (ref 70–99)
Glucose-Capillary: 132 mg/dL — ABNORMAL HIGH (ref 70–99)
Glucose-Capillary: 177 mg/dL — ABNORMAL HIGH (ref 70–99)
Glucose-Capillary: 160 mg/dL — ABNORMAL HIGH (ref 70–99)

## 2014-11-26 LAB — GLUCOSE, RANDOM: GLUCOSE: 73 mg/dL (ref 70–99)

## 2014-11-26 MED ORDER — INSULIN GLARGINE 100 UNIT/ML ~~LOC~~ SOLN
60.0000 [IU] | Freq: Every day | SUBCUTANEOUS | Status: DC
  Administered 2014-11-26: 60 [IU] via SUBCUTANEOUS
  Filled 2014-11-26 (×4): qty 0.6

## 2014-11-26 NOTE — Plan of Care (Signed)
Problem: RH BOWEL ELIMINATION Goal: RH STG MANAGE BOWEL W/MEDICATION W/ASSISTANCE STG Manage Bowel with Medication with min Assistance.  Outcome: Progressing

## 2014-11-26 NOTE — Plan of Care (Signed)
Problem: RH SAFETY Goal: RH STG ADHERE TO SAFETY PRECAUTIONS W/ASSISTANCE/DEVICE STG Adhere to Safety Precautions With Assistance/Device. Supervision  Outcome: Progressing

## 2014-11-26 NOTE — Progress Notes (Signed)
Speech Language Pathology Daily Session Note  Patient Details  Name: Chad Fuller MRN: [id number] Date of Birth: September 09, 1926  Today's Date: 11/26/2014 SLP Individual Time: 2505-3976 SLP Individual Time Calculation (min): 60 min  Short Term Goals: Week 1: SLP Short Term Goal 1 (Week 1): Pt will tolerate presentations of his curently prescribed diet with no overt s/s of aspiration with supervision cues for use of swallowing precautions.  SLP Short Term Goal 2 (Week 1): Pt will improve functional problem solving for basic tasks for 75% accuracy with mod assist.  SLP Short Term Goal 3 (Week 1): Pt will improve use of compensatory strategies to facilitate recall of new information for 75% accuracy with mod assist  SLP Short Term Goal 4 (Week 1): Pt will improve speech intelligibility at the conversational level to >90% with min cues for use of dysarthria strategies.  SLP Short Term Goal 5 (Week 1): Pt will complete pharyngeal and oral motor strengthening exercises to improve right sided weakness and overall swallowing function with min cues   Skilled Therapeutic Interventions:  Pt was seen for skilled ST targeting goals for dysphagia and cognition.  Upon arrival, pt was seated upright in wheelchair, awake, alert, and agreeable to participate in [place].  SLP reviewed pharyngeal strengthening exercises from previous therapy session with pt able to return demonstration of targeted exercises with min cues and use of written aid.  SLP facilitated the session with trials of nectar thick liquids via cup sips with no overt s/s of aspiration noted at bedside; however, given that pt was noted with silent aspiration/penetration on most recent objective swallowing study, SLP continues to recommend nectar thick liquids via teaspoons at meals.  Additionally, SLP also facilitated the session with min question cues related to recommended dysarthria strategies from previous therapy session with pt able to recall at least 2  (slow rate and increased vocal intensity).  Upon return to room, SLP reviewed and reinforced use of call bell as pt has exhibited unsafe behaviors in his room and is no longer to be left alone while sitting up in his wheelchair per report.  Pt returned demonstration of use of call bell to request medicine from RN with supervision cues.  Pt is making good progress towards meeting goals.  Continue per current plan of care.   FIM:  Comprehension Comprehension Mode: Auditory Comprehension: 5-Follows basic conversation/direction: With extra time/assistive device Expression Expression Mode: Verbal Expression: 4-Expresses basic 75 - 89% of the time/requires cueing 10 - 24% of the time. Needs helper to occlude trach/needs to repeat words. Social Interaction Social Interaction: 5-Interacts appropriately 90% of the time - Needs monitoring or encouragement for participation or interaction. Problem Solving Problem Solving: 3-Solves basic 50 - 74% of the time/requires cueing 25 - 49% of the time Memory Memory: 3-Recognizes or recalls 50 - 74% of the time/requires cueing 25 - 49% of the time  Pain Pain Assessment Pain Assessment: No/denies pain  Therapy/Group: Individual Therapy  Raeonna Milo, Selinda Orion 11/26/2014, 3:44 PM

## 2014-11-26 NOTE — Plan of Care (Signed)
Problem: RH PAIN MANAGEMENT Goal: RH STG PAIN MANAGED AT OR BELOW PT'S PAIN GOAL <4  Outcome: Progressing

## 2014-11-26 NOTE — Plan of Care (Signed)
Problem: RH BOWEL ELIMINATION Goal: RH STG MANAGE BOWEL WITH ASSISTANCE STG Manage Bowel with Assistance. Mod I  Outcome: Progressing Goal: RH STG MANAGE BOWEL W/MEDICATION W/ASSISTANCE STG Manage Bowel with Medication with min Assistance.  Outcome: Progressing  Problem: RH BLADDER ELIMINATION Goal: RH STG MANAGE BLADDER WITH ASSISTANCE STG Manage Bladder With Assistance. Mod I  Outcome: Progressing  Problem: RH SKIN INTEGRITY Goal: RH STG SKIN FREE OF INFECTION/BREAKDOWN Skin to remain free from infection/breakdown while on rehab with min assist  Outcome: Progressing  Problem: RH SAFETY Goal: RH STG ADHERE TO SAFETY PRECAUTIONS W/ASSISTANCE/DEVICE STG Adhere to Safety Precautions With Assistance/Device. Supervision  Outcome: Progressing  Problem: RH PAIN MANAGEMENT Goal: RH STG PAIN MANAGED AT OR BELOW PT'S PAIN GOAL <4  Outcome: Progressing  Problem: RH KNOWLEDGE DEFICIT Goal: RH STG INCREASE KNOWLEDGE OF DIABETES Patient will state s/s of hypo/hyperglycemia, dietary and medication management with mod assist  Outcome: Progressing

## 2014-11-26 NOTE — Progress Notes (Addendum)
Nursing Note: Pt called to alert staff that he felt weak as if his sugar was low.A: Pt cbg checked and was 56.Hypoglycemic protocol initiated. Pt given snack and stat lab for glucose ordered.After eating 15G snack ,pt re-checked at 0427 for cbg of 65.A: Repeated snack and re-checked sugar for 93 at 0445.By this time glucose that was drawn by the lab at 0417 came back for 73.Dr.Swartz notified.Pt states he feels much better.wbb

## 2014-11-26 NOTE — Progress Notes (Signed)
Recreational Therapy Assessment and Plan  Patient Details  Name: Chad Fuller MRN: [id number] Date of Birth: 06/24/1926 Today's Date: 11/26/2014  Rehab Potential: Good ELOS: 2 weeks   Assessment Clinical Impression:Problem List:  Patient Active Problem List   Diagnosis Date Noted  . Left pontine CVA 11/20/2014  . Dysphagia, post-stroke 11/20/2014  . TIA (transient ischemic attack) 11/19/2014  . Type 2 diabetes mellitus 11/19/2014  . Aortic heart murmur 11/19/2014  . Non-small cell carcinoma of lung 11/19/2014  . Recurrent falls 11/19/2014  . Dizziness and giddiness 11/19/2014  . CVA (cerebral infarction) 11/19/2014  . Right hemiparesis   . Type II or unspecified type diabetes mellitus with neurological manifestations, not stated as uncontrolled 11/14/2013  . Pain 11/14/2013  . Closed fracture of metatarsal bone(s) 10/10/2013  . ASTHMA 02/23/2009  . Coronary atherosclerosis 02/05/2009  . NEOPLASM, MALIGNANT, LUNG 02/04/2009  . DIABETES, TYPE 2 02/04/2009  . PULMONARY EMBOLISM 02/04/2009  . C O P D 02/04/2009  . DYSPNEA 02/04/2009    Past Medical History:  Past Medical History  Diagnosis Date  . Diabetes mellitus   . Cancer     lung  . Prostate enlargement   . History of blood clots   . CAD (coronary artery disease)   . Pulmonary embolus july 2005   Past Surgical History:  Past Surgical History  Procedure Laterality Date  . Lung removal, partial    . Coronary stent placement    . Knee surgery    . Filtering procedure      reports filter placed after knee surgery for blood clots  . I&d extremity  08/16/2012    Procedure: MINOR IRRIGATION AND DEBRIDEMENT EXTREMITY; Surgeon: Tennis Must, MD; Location: [place]; Service: Orthopedics; Laterality: Right;    Assessment & Plan Chad Fuller is an 80 year old male with  history of DM type 2 with neuropathy, PE, NSCLC s/p resection, CAD who was admitted on 11/18/14 with slurred speech, dizziness and difficulty walking with fall. CT head without acute changes, generalized atrophy and right forehead contusion. CT cervical spine with stenosis C3/4 to C 5/6. MRI/MRA brain with Small acute brainstem infarct in the left paracentral pons and no significant stenosis. 2D echo with EF 60%, AV sclerosis and mitral annular calcification without stenosis. Carotid dopplers without significant stenosis. Neurology recommended ASA for thrombotic stroke due to SVD. Speech therapy evaluation with dysphagia and FEES done today and patient to continue on Dysphagia 3, nectar liquids by spoon. Patient with resultant ataxia, right sided weakness with RLE instability, dysphagia as well as apraxia. Patient transferred to CIR on 11/20/2014.   decreased coordination and decreased motor planning and decreased awareness, decreased problem solving, decreased safety awareness and decreased memory    Pt presents with decreased activity tolerance, decreased functional mobility, decreased balance, decreased coordination, decreased problem solving, decreased memory, decreased awareness Limiting pt's independence with leisure/community pursuits.   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 - Travel (Comment) Leisure Participation Style: With Family/Friends Psychosocial / Spiritual Social interaction - Mood/Behavior: Cooperative Academic librarian Appropriate for Education?: Yes Recreational Therapy Orientation Orientation -Reviewed with patient: Available activity resources Strengths/Weaknesses Patient Strengths/Abilities: Willingness to participate;Active premorbidly Patient weaknesses: Physical limitations TR Patient demonstrates impairments in the following area(s): Endurance;Motor;Safety;Skin Integrity  Plan Rec Therapy Plan Is patient  appropriate for Therapeutic Recreation?: Yes Rehab Potential: Good Treatment times per week: Min 1 time per week >20 minutes Estimated Length of Stay: 2 weeks TR Treatment/Interventions: Adaptive equipment instruction;1:1 session;Balance/vestibular  training;Functional mobility training;Community reintegration;Cognitive remediation/compensation;Patient/family education;Therapeutic activities;Recreation/leisure participation;Therapeutic exercise;UE/LE Coordination activities  Recommendations for other services: None  Discharge Criteria: Patient will be discharged from TR if patient refuses treatment 3 consecutive times without medical reason.  If treatment goals not met, if there is a change in medical status, if patient makes no progress towards goals or if patient is discharged from hospital.  The above assessment, treatment plan, treatment alternatives and goals were discussed and mutually agreed upon: by patient  [place] 11/26/2014, 12:07 PM

## 2014-11-26 NOTE — Plan of Care (Signed)
Problem: RH BOWEL ELIMINATION Goal: RH STG MANAGE BOWEL WITH ASSISTANCE STG Manage Bowel with Assistance. Mod I  Outcome: Progressing

## 2014-11-26 NOTE — Progress Notes (Signed)
80 year old male with history of DM type 2 with neuropathy, PE, NSCLC s/p resection, CAD who was admitted on 11/18/14 with slurred speech, dizziness and difficulty walking with fall. CT head without acute changes, generalized atrophy and right forehead contusion. CT cervical spine with stenosis C3/4 to C 5/6. MRI/MRA brain with Small acute brainstem infarct in the left paracentral pons and no significant stenosis.  2D echo with EF 60%, AV sclerosis and mitral annular calcification without stenosis. Carotid dopplers without significant stenosis. Neurology recommended ASA for thrombotic stroke due to SVD. Speech therapy evaluation with dysphagia and FEES done  Subjective/Complaints: Pt aware of low cbgs states he uses Lantus 70 Unit Qam at home Discussed DM management Review of Systems - Negative except as above Objective: Vital Signs: Blood pressure 130/58, pulse 72, temperature 98 F (36.7 C), temperature source Oral, resp. rate 20, weight 104.282 kg (229 lb 14.4 oz), SpO2 95 %. No results found. Results for orders placed or performed during the hospital encounter of 11/20/14 (from the past 72 hour(s))  Glucose, capillary     Status: Abnormal   Collection Time: 11/23/14 11:14 AM  Result Value Ref Range   Glucose-Capillary 180 (H) 70 - 99 mg/dL   Comment 1 Notify RN   Glucose, capillary     Status: Abnormal   Collection Time: 11/23/14  4:30 PM  Result Value Ref Range   Glucose-Capillary 218 (H) 70 - 99 mg/dL   Comment 1 Notify RN   Glucose, capillary     Status: Abnormal   Collection Time: 11/23/14  9:19 PM  Result Value Ref Range   Glucose-Capillary 249 (H) 70 - 99 mg/dL  Glucose, capillary     Status: None   Collection Time: 11/24/14  6:53 AM  Result Value Ref Range   Glucose-Capillary 82 70 - 99 mg/dL   Comment 1 Notify RN   Glucose, capillary     Status: Abnormal   Collection Time: 11/24/14 11:21 AM  Result Value Ref Range   Glucose-Capillary 194 (H) 70 - 99 mg/dL   Comment 1  Notify RN   Culture, Urine     Status: None   Collection Time: 11/24/14  3:06 PM  Result Value Ref Range   Specimen Description URINE, CLEAN CATCH    Special Requests NONE    Culture  Setup Time      11/24/2014 21:37 Performed at [place]      2,000 COLONIES/ML Performed at [facility]    Culture      INSIGNIFICANT GROWTH Performed at [facility]    Report Status 11/25/2014 FINAL   Urinalysis, Routine w reflex microscopic     Status: Abnormal   Collection Time: 11/24/14  3:06 PM  Result Value Ref Range   Color, Urine YELLOW YELLOW   APPearance CLEAR CLEAR   Specific Gravity, Urine 1.034 (H) 1.005 - 1.030   pH 5.5 5.0 - 8.0   Glucose, UA >1000 (A) NEGATIVE mg/dL   Hgb urine dipstick NEGATIVE NEGATIVE   Bilirubin Urine NEGATIVE NEGATIVE   Ketones, ur 15 (A) NEGATIVE mg/dL   Protein, ur NEGATIVE NEGATIVE mg/dL   Urobilinogen, UA 1.0 0.0 - 1.0 mg/dL   Nitrite NEGATIVE NEGATIVE   Leukocytes, UA NEGATIVE NEGATIVE  Urine microscopic-add on     Status: None   Collection Time: 11/24/14  3:06 PM  Result Value Ref Range   Squamous Epithelial / LPF RARE RARE  Glucose, capillary  Status: Abnormal   Collection Time: 11/24/14  4:33 PM  Result Value Ref Range   Glucose-Capillary 204 (H) 70 - 99 mg/dL   Comment 1 Notify RN   Glucose, capillary     Status: Abnormal   Collection Time: 11/24/14  8:43 PM  Result Value Ref Range   Glucose-Capillary 162 (H) 70 - 99 mg/dL  Glucose, capillary     Status: Abnormal   Collection Time: 11/25/14  4:19 AM  Result Value Ref Range   Glucose-Capillary 69 (L) 70 - 99 mg/dL  Glucose, capillary     Status: None   Collection Time: 11/25/14  4:37 AM  Result Value Ref Range   Glucose-Capillary 82 70 - 99 mg/dL  Glucose, capillary     Status: None   Collection Time: 11/25/14  7:20 AM  Result Value Ref Range   Glucose-Capillary 75 70 - 99 mg/dL  Glucose, capillary     Status: Abnormal   Collection  Time: 11/25/14 12:03 PM  Result Value Ref Range   Glucose-Capillary 115 (H) 70 - 99 mg/dL   Comment 1 Notify RN   Glucose, capillary     Status: Abnormal   Collection Time: 11/25/14  5:11 PM  Result Value Ref Range   Glucose-Capillary 142 (H) 70 - 99 mg/dL   Comment 1 Notify RN   Glucose, capillary     Status: Abnormal   Collection Time: 11/25/14  8:43 PM  Result Value Ref Range   Glucose-Capillary 196 (H) 70 - 99 mg/dL  Glucose, capillary     Status: Abnormal   Collection Time: 11/26/14  4:11 AM  Result Value Ref Range   Glucose-Capillary 56 (L) 70 - 99 mg/dL  Glucose, random     Status: None   Collection Time: 11/26/14  4:21 AM  Result Value Ref Range   Glucose, Bld 73 70 - 99 mg/dL  Glucose, capillary     Status: Abnormal   Collection Time: 11/26/14  4:27 AM  Result Value Ref Range   Glucose-Capillary 65 (L) 70 - 99 mg/dL  Glucose, capillary     Status: None   Collection Time: 11/26/14  4:45 AM  Result Value Ref Range   Glucose-Capillary 93 70 - 99 mg/dL  Glucose, capillary     Status: Abnormal   Collection Time: 11/26/14  6:51 AM  Result Value Ref Range   Glucose-Capillary 143 (H) 70 - 99 mg/dL     HEENT: normal Cardio: RRR and no murmurs Resp: CTA B/L and unlabored GI: BS positive and NT,ND Extremity:  Pulses positive and No Edema Skin:   Intact and Wound R forehead ecchymosis, faint, non tender Neuro: Alert/Oriented, Abnormal Sensory decreased LT sensation bilateral feet, Abnormal Motor 3- R delt, bi, tri, grip, HF, KE, ADF and Abnormal FMC Ataxic/ dec FMC, speech dysarthric, right facial droop. Impulsive, decreased insight and awareness Musc/Skel:  Other tightness bilateral hamstrings Gen NAD   Assessment/Plan: 1. 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 Physiatrist is providing close team supervision and 24  hour management of active medical problems listed below. Physiatrist and rehab team continue to assess barriers to discharge/monitor patient progress toward functional and medical goals. FIM: FIM - Bathing Bathing Steps Patient Completed: Chest, Right Arm, Left Arm, Abdomen, Left upper leg, Right upper leg, Buttocks, Front perineal area, Left lower leg (including foot), Right lower leg (including foot) Bathing: 4: Steadying  assist  FIM - Upper Body Dressing/Undressing Upper body dressing/undressing steps patient completed: Thread/unthread right sleeve of pullover shirt/dresss, Thread/unthread left sleeve of pullover shirt/dress, Put head through opening of pull over shirt/dress, Pull shirt over trunk Upper body dressing/undressing: 5: Set-up assist to: Obtain clothing/put away FIM - Lower Body Dressing/Undressing Lower body dressing/undressing steps patient completed: Thread/unthread left pants leg, Pull pants up/down, Don/Doff left sock, Don/Doff left shoe, Fasten/unfasten left shoe, Thread/unthread right pants leg Lower body dressing/undressing: 4: Min-Patient completed 75 plus % of tasks  FIM - Toileting Toileting steps completed by patient: Performs perineal hygiene, Adjust clothing prior to toileting, Adjust clothing after toileting Toileting Assistive Devices: Grab bar or rail for support Toileting: 4: Steadying assist  FIM - Radio producer Devices: Grab bars Toilet Transfers: 4-To toilet/BSC: Min A (steadying Pt. > 75%), 4-From toilet/BSC: Min A (steadying Pt. > 75%)  FIM - Bed/Chair Transfer Bed/Chair Transfer Assistive Devices: Walker, Arm rests Bed/Chair Transfer: 4: Bed > Chair or W/C: Min A (steadying Pt. > 75%), 4: Chair or W/C > Bed: Min A (steadying Pt. > 75%), 5: Supine > Sit: Supervision (verbal cues/safety issues)  FIM - Locomotion: Wheelchair Distance: 150 Locomotion: Wheelchair: 5: Travels 150 ft or more: maneuvers on rugs and over door  sills with supervision, cueing or coaxing FIM - Locomotion: Ambulation Locomotion: Ambulation Assistive Devices: Administrator Ambulation/Gait Assistance: 3: Mod assist Locomotion: Ambulation: 2: Travels 50 - 149 ft with moderate assistance (Pt: 50 - 74%)  Comprehension Comprehension Mode: Auditory Comprehension: 5-Follows basic conversation/direction: With extra time/assistive device  Expression Expression Mode: Verbal Expression: 4-Expresses basic 75 - 89% of the time/requires cueing 10 - 24% of the time. Needs helper to occlude trach/needs to repeat words.  Social Interaction Social Interaction: 4-Interacts appropriately 75 - 89% of the time - Needs redirection for appropriate language or to initiate interaction.  Problem Solving Problem Solving Mode: Not assessed Problem Solving: 5-Solves basic problems: With no assist  Memory Memory: 3-Recognizes or recalls 50 - 74% of the time/requires cueing 25 - 49% of the time  Medical Problem List and Plan: 1. Functional deficits secondary to Left paracentral pontine stroke 2.  DVT Prophylaxis/Anticoagulation: Pharmaceutical: Lovenox 3. Pain Management: Tylenol prn for back pain. Will add K pad additionally.   4. Mood: LCSW to follow for evaluation and support.   5. Neuropsych: This patient is capable of making decisions on his own behalf.  -waist belt in wheelchair, up at nurses station prn for safety 6. Skin/Wound Care: Routine pressure relief measures. Rehab RN to monitor skin daily 7. Fluids/Electrolytes/Nutrition: Monitor I/O. Offer nutritional supplement prn if intake poor.IVF   8. DM type 2 with neuropathy: Will monitor BS with ac/hs checks. reduce lantus to 60 units and resumed metformin  -continue 850 metformin in am and reduce pm dose back down to 500 9.  BPH: Continue proscar. Monitor for any voiding difficulty.    10.  HTN: Will monitor blood pressure tid. Currently controlled off HCTZ.  intake improving, BPs rising will  restart low dose 11. GERD: will add Pepcid for symptoms.  12. Uro: check ua negative, culture pending   LOS (Days) 6 A FACE TO FACE EVALUATION WAS PERFORMED  Ninoska Goswick E 11/26/2014, 7:50 AM

## 2014-11-26 NOTE — Plan of Care (Signed)
Problem: RH SKIN INTEGRITY Goal: RH STG SKIN FREE OF INFECTION/BREAKDOWN Skin to remain free from infection/breakdown while on rehab with min assist  Outcome: Progressing

## 2014-11-26 NOTE — Progress Notes (Signed)
Occupational Therapy Session Note  Patient Details  Name: Chad Fuller MRN: [id number] Date of Birth: 10-29-1926  Today's Date: 11/26/2014 OT Individual Time: 1100-1200 OT Calculated Individual Time (min): 60 min   Short Term Goals: Week 1:  OT Short Term Goal 1 (Week 1): Pt will ambulate to toilet with close S with RW. OT Short Term Goal 2 (Week 1): Pt will don his pants over his R foot. OT Short Term Goal 3 (Week 1): Pt will don sock and shoe on R foot.  OT Short Term Goal 4 (Week 1): Pt will stand at sink to groom with min A.  Skilled Therapeutic Interventions/Progress Updates:  Skilled OT session focusing on ADL retraining, functional ambulation, functional transfers, dynamic standing balance, safety awareness, and activity tolerance/endurance. Pt seated in w/c when arriving, reporting no pain. Therapist covered IV with bag. Pt ambulated to bathroom using RW with min A and transferred to toilet. Pt completed toileting and ambulated and transferred to TTB. Pt doffed clothing and completed UB/LB bathing, taking more than reasonable amount of time and requiring min A for standing balance and min v/c for hand placement when sitting and standing in shower. Pt ambulated to w/c and completed UB/LB dressing. Pt completed hair brushing and cleaned glasses while seated in w/c at sink. Pt seated in w/c with all needs nearby and safety belt applied when leaving. Pt requiring min-mod instructional v/c throughout session for sequencing of tasks, safety with RW, and safety during transfers.  Therapy Documentation Precautions:  Precautions Precautions: Fall Precaution Comments: multiple falls at home; stated he falls up or down entry steps at home about once per month, due to "not knowing where his feet are" Restrictions Weight Bearing Restrictions: No  See FIM for current functional status  Therapy/Group: Individual Therapy  Chenee Munns Raynell 11/26/2014, 12:17 PM

## 2014-11-26 NOTE — Progress Notes (Addendum)
Physical Therapy Session Note  Patient Details  Name: Chad Fuller MRN: [id number] Date of Birth: 12/01/1926  Today's Date: 11/26/2014 PT Individual Time: 0800-0900 PT Individual Time Calculation (min): 60 min  Short Term Goals: Week 1:  PT Short Term Goal 1 (Week 1): pt will perform w/c>< bed transfer with min assist 50% of time PT Short Term Goal 2 (Week 1): pt will propel w/c x 150' with supervision/min assist PT Short Term Goal 3 (Week 1): pt will perform sit>< stand with min assist PT Short Term Goal 4 (Week 1): pt will perform gait with LRAD x 50' with mod assist PT Short Term Goal 5 (Week 1): pt will ascend/descend 5 steps with min assist without impulsivity  Skilled Therapeutic Interventions/Progress Updates:  Toilet> w/c with min guard assist. Donned sandals in sitting with assistance; S for balance.  W/c> stand at sink for oral care with close supervision.  neuromuscular re-education via demo, forced use, VCs for: -W/c propulsion using bil UEs x 75' including turns with supervision -NuStep at level 4 rated 12 on Borg scale, focusing on R neutral hip rotation and control , as it tends to drop out into ER -self stretching bil hamstrings and heel cords in sitting, for improved balance reactions -gait with RW x 30' with min assist, mod/max cues for upright trunk, forward gaze, R foot clearance  As pt fatigued during gait, RLE adducted more during swing phase, with resulting heels bumping against each other with each step.  Pt returned to room to rest in w/c, propelling w/c x 75' with supervision for steering.  Quick release belt applied and all needs left within reach.    Therapy Documentation Precautions:  Precautions Precautions: Fall Precaution Comments: multiple falls at home; stated he falls up or down entry steps at home about once per month, due to "not knowing where his feet are" Restrictions Weight Bearing Restrictions: No ital Signs: Pulse Rate: 83   sitting Oxygen Therapy SpO2: 95 % O2 Device: Not Delivered Pain: Pain Assessment Pain Assessment: No/denies pain     See FIM for current functional status  Therapy/Group: Individual Therapy  Maevyn Riordan 11/26/2014, 5:06 PM

## 2014-11-26 NOTE — Progress Notes (Signed)
Hypoglycemic Event  CBG:65  Treatment: 15 GM carbohydrate snack  Symptoms: Nervous/irritable  Follow-up CBG: 3536 CBG Result:93  Possible Reasons for Event: Unknown  Comments/MD notified:Dr.Swartz    Luana Shu, Dorothyann Peng  Remember to initiate Hypoglycemia Order Set & complete

## 2014-11-26 NOTE — Progress Notes (Signed)
Hypoglycemic Event  CBG: 56  Treatment: 15 GM carbohydrate snack  Symptoms: Nervous/irritable  Follow-up CBG: Time:0427 CBG Result:65  Possible Reasons for Event: unknown  Comments/MD notified:Dr.Swartz/    Tara Wich, Dorothyann Peng  Remember to initiate Hypoglycemia Order Set & complete

## 2014-11-27 ENCOUNTER — Ambulatory Visit (HOSPITAL_COMMUNITY): Payer: Medicare Other | Admitting: *Deleted

## 2014-11-27 ENCOUNTER — Inpatient Hospital Stay (HOSPITAL_COMMUNITY): Payer: Medicare Other | Admitting: Speech Pathology

## 2014-11-27 ENCOUNTER — Inpatient Hospital Stay (HOSPITAL_COMMUNITY): Payer: Medicare Other | Admitting: Occupational Therapy

## 2014-11-27 DIAGNOSIS — E11649 Type 2 diabetes mellitus with hypoglycemia without coma: Secondary | ICD-10-CM

## 2014-11-27 LAB — GLUCOSE, CAPILLARY
GLUCOSE-CAPILLARY: 103 mg/dL — AB (ref 70–99)
GLUCOSE-CAPILLARY: 180 mg/dL — AB (ref 70–99)
GLUCOSE-CAPILLARY: 248 mg/dL — AB (ref 70–99)
Glucose-Capillary: 49 mg/dL — ABNORMAL LOW (ref 70–99)
Glucose-Capillary: 97 mg/dL (ref 70–99)
Glucose-Capillary: 171 mg/dL — ABNORMAL HIGH (ref 70–99)

## 2014-11-27 LAB — CREATININE, SERUM
Creatinine, Ser: 0.63 mg/dL (ref 0.50–1.35)
GFR calc Af Amer: >90 mL/min (ref >90)
GFR, EST NON AFRICAN AMERICAN: 87 mL/min — AB (ref >90)

## 2014-11-27 MED ORDER — INSULIN GLARGINE 100 UNIT/ML ~~LOC~~ SOLN
50.0000 [IU] | Freq: Every day | SUBCUTANEOUS | Status: DC
  Administered 2014-11-27 – 2014-12-05 (×9): 50 [IU] via SUBCUTANEOUS
  Filled 2014-11-27 (×9): qty 0.5

## 2014-11-27 NOTE — Progress Notes (Signed)
Speech Language Pathology Daily Session Note  Patient Details  Name: Chad Fuller MRN: [id number] Date of Birth: 21-Aug-1926  Today's Date: 11/27/2014 SLP Individual Time: 0938-1829 SLP Individual Time Calculation (min): 62 min  Short Term Goals: Week 1: SLP Short Term Goal 1 (Week 1): Pt will tolerate presentations of his curently prescribed diet with no overt s/s of aspiration with supervision cues for use of swallowing precautions.  SLP Short Term Goal 2 (Week 1): Pt will improve functional problem solving for basic tasks for 75% accuracy with mod assist.  SLP Short Term Goal 3 (Week 1): Pt will improve use of compensatory strategies to facilitate recall of new information for 75% accuracy with mod assist  SLP Short Term Goal 4 (Week 1): Pt will improve speech intelligibility at the conversational level to >90% with min cues for use of dysarthria strategies.  SLP Short Term Goal 5 (Week 1): Pt will complete pharyngeal and oral motor strengthening exercises to improve right sided weakness and overall swallowing function with min cues   Skilled Therapeutic Interventions:  Pt was seen for skilled ST targeting cognitive goals.  Upon arrival, pt was seated upright in wheelchair with visitors presents.  When visitors left pt became tearful but was easily redirected to structured tasks.  SLP facilitated the session with a semi-complex functional task for sorting and organizing information.  Pt required overall min assist verbal cues to self monitor and correct errors and min assist faded to supervision verbal cues for planning and organization of materials.  Upon return to room, pt requested to use the restroom and required min assist verbal cues to apply the brakes and improve safety awareness while ambulating from the wheelchair to the bathroom.  At the end of the session, pt returned to wheelchair, quick release belt reapplied per RN.  SLP reviewed and reinforced use of call bell as part of pt's  safety precautions.  Overall pt continues to progress towards meeting goals.  Continue per current plan of care.    FIM:  Comprehension Comprehension Mode: Auditory Comprehension: 5-Follows basic conversation/direction: With extra time/assistive device Expression Expression Mode: Verbal Expression: 5-Expresses basic needs/ideas: With extra time/assistive device Social Interaction Social Interaction: 5-Interacts appropriately 90% of the time - Needs monitoring or encouragement for participation or interaction. Problem Solving Problem Solving: 4-Solves basic 75 - 89% of the time/requires cueing 10 - 24% of the time Memory Memory: 4-Recognizes or recalls 75 - 89% of the time/requires cueing 10 - 24% of the time   Pain Pain Assessment Pain Assessment: No/denies pain  Therapy/Group: Individual Therapy   Windell Moulding, M.A. CCC-SLP  Daivd Fredericksen, Selinda Orion 11/27/2014, 2:49 PM

## 2014-11-27 NOTE — Plan of Care (Signed)
Problem: RH BOWEL ELIMINATION Goal: RH STG MANAGE BOWEL WITH ASSISTANCE STG Manage Bowel with Assistance. Mod I  Outcome: Progressing Goal: RH STG MANAGE BOWEL W/MEDICATION W/ASSISTANCE STG Manage Bowel with Medication with min Assistance.  Outcome: Progressing  Problem: RH BLADDER ELIMINATION Goal: RH STG MANAGE BLADDER WITH ASSISTANCE STG Manage Bladder With Assistance. Mod I  Outcome: Progressing  Problem: RH SKIN INTEGRITY Goal: RH STG SKIN FREE OF INFECTION/BREAKDOWN Skin to remain free from infection/breakdown while on rehab with min assist  Outcome: Progressing  Problem: RH PAIN MANAGEMENT Goal: RH STG PAIN MANAGED AT OR BELOW PT'S PAIN GOAL <4  Outcome: Progressing  Problem: RH KNOWLEDGE DEFICIT Goal: RH STG INCREASE KNOWLEDGE OF DIABETES Patient will state s/s of hypo/hyperglycemia, dietary and medication management with mod assist  Outcome: Progressing

## 2014-11-27 NOTE — Progress Notes (Signed)
Recreational Therapy Session Note  Patient Details  Name: Chad Fuller MRN: [id number] Date of Birth: [birth year] Today's Date: 11/27/2014  Pain: no c/o Skilled Therapeutic Interventions/Progress Updates: Session focused on activity tolerance, ambulation with RW, dynamic standing balance, weight shifting, BUE use, & overall safety & awareness.  Pt ambulated with RW with min assist & mod instructional cues to keep feet apart, for heel strike, & posture.  Pt stood to toss & catch a ball using BUE's & then progressed to standing without UE support to kick a ball using RLE.  Pt required min-mod assist for balance.  Pt did experience LOB x3 in which he was unable to recover resulting in sitting on the mat.   Therapy/Group: Co-Treatment [name] 11/27/2014, 12:49 PM

## 2014-11-27 NOTE — Plan of Care (Signed)
Problem: RH BOWEL ELIMINATION Goal: RH STG MANAGE BOWEL WITH ASSISTANCE STG Manage Bowel with Assistance. Mod I  Outcome: Progressing Goal: RH STG MANAGE BOWEL W/MEDICATION W/ASSISTANCE STG Manage Bowel with Medication with min Assistance.  Outcome: Progressing  Problem: RH BLADDER ELIMINATION Goal: RH STG MANAGE BLADDER WITH ASSISTANCE STG Manage Bladder With Assistance. Mod I  Outcome: Progressing  Problem: RH SKIN INTEGRITY Goal: RH STG SKIN FREE OF INFECTION/BREAKDOWN Skin to remain free from infection/breakdown while on rehab with min assist  Outcome: Progressing  Problem: RH SAFETY Goal: RH STG ADHERE TO SAFETY PRECAUTIONS W/ASSISTANCE/DEVICE STG Adhere to Safety Precautions With Assistance/Device. Supervision  Outcome: Progressing  Problem: RH PAIN MANAGEMENT Goal: RH STG PAIN MANAGED AT OR BELOW PT'S PAIN GOAL <4  Outcome: Progressing

## 2014-11-27 NOTE — Progress Notes (Signed)
80 year old male with history of DM type 2 with neuropathy, PE, NSCLC s/p resection, CAD who was admitted on 11/18/14 with slurred speech, dizziness and difficulty walking with fall. CT head without acute changes, generalized atrophy and right forehead contusion. CT cervical spine with stenosis C3/4 to C 5/6. MRI/MRA brain with Small acute brainstem infarct in the left paracentral pons and no significant stenosis.  2D echo with EF 60%, AV sclerosis and mitral annular calcification without stenosis. Carotid dopplers without significant stenosis. Neurology recommended ASA for thrombotic stroke due to SVD. Speech therapy evaluation with dysphagia and FEES done  Subjective/Complaints: Pt aware of low cbgs states he uses Lantus 70 Unit Qam at home, eating 100% meals, received pm snack last noc More active in therapies Discussed DM management Review of Systems - Negative except as above Objective: Vital Signs: Blood pressure 138/72, pulse 76, temperature 98.1 F (36.7 C), temperature source Oral, resp. rate 18, weight 104.282 kg (229 lb 14.4 oz), SpO2 98 %. No results found. Results for orders placed or performed during the hospital encounter of 11/20/14 (from the past 72 hour(s))  Glucose, capillary     Status: Abnormal   Collection Time: 11/24/14 11:21 AM  Result Value Ref Range   Glucose-Capillary 194 (H) 70 - 99 mg/dL   Comment 1 Notify RN   Culture, Urine     Status: None   Collection Time: 11/24/14  3:06 PM  Result Value Ref Range   Specimen Description URINE, CLEAN CATCH    Special Requests NONE    Culture  Setup Time      11/24/2014 21:37 Performed at [facility]      2,000 COLONIES/ML Performed at [facility]    Culture      INSIGNIFICANT GROWTH Performed at [facility]    Report Status 11/25/2014 FINAL   Urinalysis, Routine w reflex microscopic     Status: Abnormal   Collection Time: 11/24/14  3:06 PM  Result Value Ref Range   Color, Urine YELLOW YELLOW   APPearance CLEAR CLEAR   Specific Gravity, Urine 1.034 (H) 1.005 - 1.030   pH 5.5 5.0 - 8.0   Glucose, UA >1000 (A) NEGATIVE mg/dL   Hgb urine dipstick NEGATIVE NEGATIVE   Bilirubin Urine NEGATIVE NEGATIVE   Ketones, ur 15 (A) NEGATIVE mg/dL   Protein, ur NEGATIVE NEGATIVE mg/dL   Urobilinogen, UA 1.0 0.0 - 1.0 mg/dL   Nitrite NEGATIVE NEGATIVE   Leukocytes, UA NEGATIVE NEGATIVE  Urine microscopic-add on     Status: None   Collection Time: 11/24/14  3:06 PM  Result Value Ref Range   Squamous Epithelial / LPF RARE RARE  Glucose, capillary     Status: Abnormal   Collection Time: 11/24/14  4:33 PM  Result Value Ref Range   Glucose-Capillary 204 (H) 70 - 99 mg/dL   Comment 1 Notify RN   Glucose, capillary     Status: Abnormal   Collection Time: 11/24/14  8:43 PM  Result Value Ref Range   Glucose-Capillary 162 (H) 70 - 99 mg/dL  Glucose, capillary     Status: Abnormal   Collection Time: 11/25/14  4:19 AM  Result Value Ref Range   Glucose-Capillary 69 (L) 70 - 99 mg/dL  Glucose, capillary     Status: None   Collection Time: 11/25/14  4:37 AM  Result Value Ref Range   Glucose-Capillary 82 70 - 99 mg/dL  Glucose, capillary  Status: None   Collection Time: 11/25/14  7:20 AM  Result Value Ref Range   Glucose-Capillary 75 70 - 99 mg/dL  Glucose, capillary     Status: Abnormal   Collection Time: 11/25/14 12:03 PM  Result Value Ref Range   Glucose-Capillary 115 (H) 70 - 99 mg/dL   Comment 1 Notify RN   Glucose, capillary     Status: Abnormal   Collection Time: 11/25/14  5:11 PM  Result Value Ref Range   Glucose-Capillary 142 (H) 70 - 99 mg/dL   Comment 1 Notify RN   Glucose, capillary     Status: Abnormal   Collection Time: 11/25/14  8:43 PM  Result Value Ref Range   Glucose-Capillary 196 (H) 70 - 99 mg/dL  Glucose, capillary     Status: Abnormal   Collection Time: 11/26/14  4:11 AM  Result Value Ref Range   Glucose-Capillary 56 (L) 70 - 99  mg/dL  Glucose, random     Status: None   Collection Time: 11/26/14  4:21 AM  Result Value Ref Range   Glucose, Bld 73 70 - 99 mg/dL  Glucose, capillary     Status: Abnormal   Collection Time: 11/26/14  4:27 AM  Result Value Ref Range   Glucose-Capillary 65 (L) 70 - 99 mg/dL  Glucose, capillary     Status: None   Collection Time: 11/26/14  4:45 AM  Result Value Ref Range   Glucose-Capillary 93 70 - 99 mg/dL  Glucose, capillary     Status: Abnormal   Collection Time: 11/26/14  6:51 AM  Result Value Ref Range   Glucose-Capillary 143 (H) 70 - 99 mg/dL  Glucose, capillary     Status: Abnormal   Collection Time: 11/26/14 12:06 PM  Result Value Ref Range   Glucose-Capillary 132 (H) 70 - 99 mg/dL   Comment 1 Notify RN   Glucose, capillary     Status: Abnormal   Collection Time: 11/26/14  4:32 PM  Result Value Ref Range   Glucose-Capillary 177 (H) 70 - 99 mg/dL  Glucose, capillary     Status: Abnormal   Collection Time: 11/26/14  9:19 PM  Result Value Ref Range   Glucose-Capillary 160 (H) 70 - 99 mg/dL  Creatinine, serum     Status: Abnormal   Collection Time: 11/27/14  2:44 AM  Result Value Ref Range   Creatinine, Ser 0.63 0.50 - 1.35 mg/dL   GFR calc non Af Amer 87 (L) >90 mL/min   GFR calc Af Amer >90 >90 mL/min    Comment: (NOTE) The eGFR has been calculated using the CKD EPI equation. This calculation has not been validated in all clinical situations. eGFR's persistently <90 mL/min signify possible Chronic Kidney Disease.   Glucose, capillary     Status: Abnormal   Collection Time: 11/27/14  3:26 AM  Result Value Ref Range   Glucose-Capillary 49 (L) 70 - 99 mg/dL  Glucose, capillary     Status: None   Collection Time: 11/27/14  4:04 AM  Result Value Ref Range   Glucose-Capillary 97 70 - 99 mg/dL  Glucose, capillary     Status: Abnormal   Collection Time: 11/27/14  6:47 AM  Result Value Ref Range   Glucose-Capillary 103 (H) 70 - 99 mg/dL     HEENT: normal Cardio:  RRR and no murmurs Resp: CTA B/L and unlabored GI: BS positive and NT,ND Extremity:  Pulses positive and No Edema Skin:   Intact and Wound R forehead ecchymosis, faint,  non tender Neuro: Alert/Oriented, Abnormal Sensory decreased LT sensation bilateral feet, Abnormal Motor 3- R delt, bi, tri, grip, HF, KE, ADF and Abnormal FMC Ataxic/ dec FMC, speech dysarthric, right facial droop. Impulsive, decreased insight and awareness Musc/Skel:  Other tightness bilateral hamstrings Gen NAD   Assessment/Plan: 1. 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 Physiatrist is providing close team supervision and 24 hour management of active medical problems listed below. Physiatrist and rehab team continue to assess barriers to discharge/monitor patient progress toward functional and medical goals. FIM: FIM - Bathing Bathing Steps Patient Completed: Chest, Right Arm, Left Arm, Abdomen, Left upper leg, Right upper leg, Buttocks, Front perineal area, Left lower leg (including foot), Right lower leg (including foot) Bathing: 4: Steadying assist  FIM - Upper Body Dressing/Undressing Upper body dressing/undressing steps patient completed: Thread/unthread right sleeve of pullover shirt/dresss, Thread/unthread left sleeve of pullover shirt/dress, Put head through opening of pull over shirt/dress, Pull shirt over trunk Upper body dressing/undressing: 5: Set-up assist to: Obtain clothing/put away FIM - Lower Body Dressing/Undressing Lower body dressing/undressing steps patient completed: Thread/unthread left pants leg, Pull pants up/down, Don/Doff left sock, Don/Doff left shoe, Fasten/unfasten left shoe, Thread/unthread right pants leg, Don/Doff right shoe, Don/Doff right sock, Fasten/unfasten right shoe Lower body dressing/undressing: 4: Steadying Assist  FIM -  Toileting Toileting steps completed by patient: Adjust clothing prior to toileting, Performs perineal hygiene, Adjust clothing after toileting Toileting Assistive Devices: Grab bar or rail for support Toileting: 5: Supervision: Safety issues/verbal cues  FIM - Radio producer Devices: Grab bars, Insurance account manager Transfers: 4-To toilet/BSC: Min A (steadying Pt. > 75%), 4-From toilet/BSC: Min A (steadying Pt. > 75%)  FIM - Bed/Chair Transfer Bed/Chair Transfer Assistive Devices: Walker, Arm rests Bed/Chair Transfer: 4: Chair or W/C > Bed: Min A (steadying Pt. > 75%), 4: Bed > Chair or W/C: Min A (steadying Pt. > 75%)  FIM - Locomotion: Wheelchair Distance: 75 Locomotion: Wheelchair: 2: Travels 50 - 149 ft with supervision, cueing or coaxing FIM - Locomotion: Ambulation Locomotion: Ambulation Assistive Devices: Administrator Ambulation/Gait Assistance: 4: Min assist Locomotion: Ambulation: 1: Travels less than 50 ft with minimal assistance (Pt.>75%)  Comprehension Comprehension Mode: Auditory Comprehension: 5-Set-up assist with hearing device  Expression Expression Mode: Verbal Expression: 5-Expresses basic needs/ideas: With extra time/assistive device  Social Interaction Social Interaction: 5-Interacts appropriately 90% of the time - Needs monitoring or encouragement for participation or interaction.  Problem Solving Problem Solving Mode: Not assessed Problem Solving: 4-Solves basic 75 - 89% of the time/requires cueing 10 - 24% of the time  Memory Memory: 4-Recognizes or recalls 75 - 89% of the time/requires cueing 10 - 24% of the time  Medical Problem List and Plan: 1. Functional deficits secondary to Left paracentral pontine stroke 2.  DVT Prophylaxis/Anticoagulation: Pharmaceutical: Lovenox 3. Pain Management: Tylenol prn for back pain. Will add K pad additionally.   4. Mood: LCSW to follow for evaluation and support.   5. Neuropsych: This  patient is capable of making decisions on his own behalf.  -waist belt in wheelchair, up at nurses station prn for safety 6. Skin/Wound Care: Routine pressure relief measures. Rehab RN to monitor skin daily 7. Fluids/Electrolytes/Nutrition: Monitor I/O. Offer nutritional supplement prn if intake poor.IVF   8. DM type 2 with neuropathy: Will monitor BS with ac/hs checks. reduce lantus to 50 units and resumed metformin  -  continue 850 metformin in am and reduce pm dose back down to 500 9.  BPH: Continue proscar. Monitor for any voiding difficulty.    10.  HTN: Will monitor blood pressure tid. Currently controlled off HCTZ.  intake improving, BPs rising will restart low dose 11. GERD: will add Pepcid for symptoms.     LOS (Days) 7 A FACE TO FACE EVALUATION WAS PERFORMED  KIRSTEINS,ANDREW E 11/27/2014, 7:52 AM

## 2014-11-27 NOTE — Progress Notes (Signed)
Occupational Therapy Session Note  Patient Details  Name: CHANLER MENDONCA MRN: [id number] Date of Birth: 1926-10-26  Today's Date: 11/27/2014 OT Individual Time: 0910-1010 OT Individual Time Calculation (min): 60 min    Short Term Goals: Week 1:  OT Short Term Goal 1 (Week 1): Pt will ambulate to toilet with close S with RW. OT Short Term Goal 2 (Week 1): Pt will don his pants over his R foot. OT Short Term Goal 3 (Week 1): Pt will don sock and shoe on R foot.  OT Short Term Goal 4 (Week 1): Pt will stand at sink to groom with min A.  Skilled Therapeutic Interventions/Progress Updates:    Pt seen for BADL retraining of shower, dressing, grooming with a focus on postural awareness, foot positioning, and balance.  Pt was more attentive today with less tangential topics of conversation.  He was completing his breakfast at start of session. Pt eager to shower. He was encouraged to work on sit >< stands without UE support with min A to steady balance less than 30% of the time. He needs cues for foot placement as he tends to stand with feet together.  Pt had one unsteady episode in the shower when he was focusing on placing HH shower back on the hook and needed steadying A.  Pt was able to bathe, dry off, and dress without cues and steadying A. Pt admitted that he got out of the chair by himself to walk to the bathroom when he fell. Nursing has had pt supervised at nursing station. Quick release belt applied and nurse notified pt was in the room.     Therapy Documentation Precautions:  Precautions Precautions: Fall Precaution Comments: multiple falls at home; stated he falls up or down entry steps at home about once per month, due to "not knowing where his feet are" Restrictions Weight Bearing Restrictions: No    Pain: Pain Assessment Pain Assessment: No/denies pain ADL: ADL ADL Comments: Refer to FIM    See FIM for current functional status  Therapy/Group: Individual  Therapy  SAGUIER,JULIA 11/27/2014, 10:02 AM

## 2014-11-27 NOTE — Plan of Care (Signed)
Problem: RH SAFETY Goal: RH STG ADHERE TO SAFETY PRECAUTIONS W/ASSISTANCE/DEVICE STG Adhere to Safety Precautions With Assistance/Device. Supervision  Outcome: Progressing

## 2014-11-27 NOTE — Progress Notes (Signed)
Physical Therapy Weekly Progress Note  Patient Details  Name: Chad Fuller MRN: [id number] Date of Birth: 30-May-1926 Beginning of progress report period: November 21, 2014 End of progress report period: November 27, 2014  Today's Date: 11/27/2014 PT Individual Time:  8:00-9:05 (46mn)      Patient has met 4 of 5 short term goals.  Pt partially met stair goal, but he continues to demonstrate impulsivity at times during mobility, resulting into needing up to Mod A at times. Pt has progressed overall for needing Max/+2 assist with gait and mobility to Min A overall.   Patient continues to demonstrate the following deficits: R-sided motor control, decreased sensation, decreased righting reactions, and cognition and therefore will continue to benefit from skilled PT intervention to enhance overall performance with activity tolerance, balance, postural control, functional use of  right upper extremity and right lower extremity, awareness and coordination.  Patient progressing toward long term goals..  Continue plan of care.  PT Short Term Goals Week 1:  PT Short Term Goal 1 (Week 1): pt will perform w/c>< bed transfer with min assist 50% of time PT Short Term Goal 1 - Progress (Week 1): Met PT Short Term Goal 2 (Week 1): pt will propel w/c x 150' with supervision/min assist PT Short Term Goal 2 - Progress (Week 1): Met PT Short Term Goal 3 (Week 1): pt will perform sit>< stand with min assist PT Short Term Goal 3 - Progress (Week 1): Met PT Short Term Goal 4 (Week 1): pt will perform gait with LRAD x 50' with mod assist PT Short Term Goal 4 - Progress (Week 1): Met PT Short Term Goal 5 (Week 1): pt will ascend/descend 5 steps with min assist without impulsivity PT Short Term Goal 5 - Progress (Week 1): Progressing toward goal Week 2:  PT Short Term Goal 1 (Week 2): Pt will perform basic transfers with S 3/5 trials PT Short Term Goal 2 (Week 2): Pt will perform gait with RW x150 and min A PT  Short Term Goal 3 (Week 2): Pt will ascend/descend 5 stairs with S PT Short Term Goal 4 (Week 2): Pt will improve Berg Balance test score by>6 points to demonstrate fall risk reduction   Skilled Therapeutic Interventions/Progress Updates:  Tx focused on functional mobility training, gait with RW in controlled setting, dynamic balance training and Berg Balance test, score 23/56, indicating increased fall risk. Co-treat with Recreation therapy for dynamic balance training.   Pt up in bed, performed all basic transfers with Min A for steadying during turns. And cues for safe hand placement to control descent. Pt continues to demonstrate decreased stability in SLS and narrow BOS during functional tasks, with heavy UE reliance. Pt challenged to increase safety awareness by choosing appropriate times to use AD in room. Performed bathroom transfers and gait in room with RW and min A overall. Pt continues to need safety cues for RW management and posture. Pt able to stand at toilet x1 min with close S and UE support.   Pt propelled WC x75' with S and LEs for coordination training. Pt needs occasional safety cues for safe brake use.   Gait training in controlled setting 1x75' and 1x140' with min A overall for steadying. Gait marked by decreased stance width, decreased R foot clearance and heel strike and forward flexed posture with RW far ahead. Pt tends to increase speed as RW moves away, needing verbal cues to stop and restart. Pt able to adjust one component  of gait at a time, but unable to maintain significant changes throughout. Increasing heel strike seems to improve posture, which improves RW safety.   Performed dynamic standing balance with Recreation Therapy for ball toss, ball pass, and kicking ball for increased righting reactions, R NMR, and SLS activity. Pt needed up to Mod A overall, and had 3 posterior LOB, resulting in sitting on mat, unable to recover. Pt was surprised by this level of  challenge.   Instructed pt in seated HS stretch x7mn bil for increased joint mobility with cues for technique.   Administered Berg Balance test and educated pt on findings and functional implications. See details below.   [facility] belt and all needs in reach.    Therapy Documentation Precautions:  Precautions Precautions: Fall Precaution Comments: multiple falls at home; stated he falls up or down entry steps at home about once per month, due to "not knowing where his feet are" Restrictions Weight Bearing Restrictions: No    Pain: none      Balance: Standardized Balance Assessment Standardized Balance Assessment: Berg Balance Test Berg Balance Test Sit to Stand: Able to stand  independently using hands Standing Unsupported: Able to stand 2 minutes with supervision Sitting with Back Unsupported but Feet Supported on Floor or Stool: Able to sit safely and securely 2 minutes Stand to Sit: Sits independently, has uncontrolled descent Transfers: Needs one person to assist Standing Unsupported with Eyes Closed: Able to stand 10 seconds with supervision Standing Ubsupported with Feet Together: Able to place feet together independently but unable to hold for 30 seconds From Standing, Reach Forward with Outstretched Arm: Can reach forward >12 cm safely (5") From Standing Position, Pick up Object from Floor: Able to pick up shoe, needs supervision From Standing Position, Turn to Look Behind Over each Shoulder: Needs assist to keep from losing balance and falling Turn 360 Degrees: Needs assistance while turning Standing Unsupported, Alternately Place Feet on Step/Stool: Needs assistance to keep from falling or unable to try Standing Unsupported, One Foot in Front: Loses balance while stepping or standing Standing on One Leg: Unable to try or needs assist to prevent fall Total Score: 23  See FIM for current functional status  Therapy/Group: Co-Treatment with  Recreation therapy CKennieth Rad PT, DPT  11/27/2014, 9:21 AM

## 2014-11-28 ENCOUNTER — Inpatient Hospital Stay (HOSPITAL_COMMUNITY): Payer: Medicare Other | Admitting: Occupational Therapy

## 2014-11-28 ENCOUNTER — Inpatient Hospital Stay (HOSPITAL_COMMUNITY): Payer: Medicare Other | Admitting: *Deleted

## 2014-11-28 ENCOUNTER — Ambulatory Visit (HOSPITAL_COMMUNITY): Payer: Medicare Other | Admitting: Speech Pathology

## 2014-11-28 ENCOUNTER — Inpatient Hospital Stay (HOSPITAL_COMMUNITY): Payer: Medicare Other

## 2014-11-28 DIAGNOSIS — E1165 Type 2 diabetes mellitus with hyperglycemia: Secondary | ICD-10-CM

## 2014-11-28 LAB — GLUCOSE, CAPILLARY
Glucose-Capillary: 85 mg/dL (ref 70–99)
Glucose-Capillary: 109 mg/dL — ABNORMAL HIGH (ref 70–99)
Glucose-Capillary: 189 mg/dL — ABNORMAL HIGH (ref 70–99)
Glucose-Capillary: 178 mg/dL — ABNORMAL HIGH (ref 70–99)

## 2014-11-28 LAB — BASIC METABOLIC PANEL
Anion gap: 14 (ref 5–15)
BUN: 17 mg/dL (ref 6–23)
CHLORIDE: 99 meq/L (ref 96–112)
CO2: 24 mEq/L (ref 19–32)
Calcium: 9 mg/dL (ref 8.4–10.5)
Creatinine, Ser: 0.58 mg/dL (ref 0.50–1.35)
GFR calc Af Amer: >90 mL/min (ref >90)
GFR, EST NON AFRICAN AMERICAN: 90 mL/min — AB (ref >90)
GLUCOSE: 126 mg/dL — AB (ref 70–99)
POTASSIUM: 4.6 meq/L (ref 3.7–5.3)
Sodium: 137 mEq/L (ref 137–147)

## 2014-11-28 MED ORDER — METFORMIN HCL 850 MG PO TABS
850.0000 mg | ORAL_TABLET | Freq: Every day | ORAL | Status: DC
  Administered 2014-11-28: 850 mg via ORAL
  Filled 2014-11-28 (×2): qty 1

## 2014-11-28 MED ORDER — INSULIN ASPART 100 UNIT/ML ~~LOC~~ SOLN
0.0000 [IU] | Freq: Three times a day (TID) | SUBCUTANEOUS | Status: DC
Start: 2014-11-28 — End: 2014-12-05
  Administered 2014-11-28 – 2014-11-29 (×3): 2 [IU] via SUBCUTANEOUS
  Administered 2014-11-29: 3 [IU] via SUBCUTANEOUS
  Administered 2014-11-30: 1 [IU] via SUBCUTANEOUS
  Administered 2014-11-30: 2 [IU] via SUBCUTANEOUS
  Administered 2014-11-30: 1 [IU] via SUBCUTANEOUS
  Administered 2014-12-01 – 2014-12-02 (×3): 3 [IU] via SUBCUTANEOUS
  Administered 2014-12-02: 5 [IU] via SUBCUTANEOUS
  Administered 2014-12-03: 3 [IU] via SUBCUTANEOUS
  Administered 2014-12-03: 2 [IU] via SUBCUTANEOUS
  Administered 2014-12-04: 3 [IU] via SUBCUTANEOUS
  Administered 2014-12-04 – 2014-12-05 (×2): 1 [IU] via SUBCUTANEOUS

## 2014-11-28 NOTE — Progress Notes (Signed)
Occupational Therapy Session Note  Patient Details  Name: Chad Fuller MRN: [id number] Date of Birth: 18-Jul-1926  Today's Date: 11/28/2014 OT Individual Time: 0905-1005 OT Individual Time Calculation (min): 60 min    Short Term Goals: Week 1:  OT Short Term Goal 1 (Week 1): Pt will ambulate to toilet with close S with RW. OT Short Term Goal 2 (Week 1): Pt will don his pants over his R foot. OT Short Term Goal 3 (Week 1): Pt will don sock and shoe on R foot.  OT Short Term Goal 4 (Week 1): Pt will stand at sink to groom with min A.  Skilled Therapeutic Interventions/Progress Updates:      Pt seen for BADL retraining of toileting, bathing, and dressing with a focus on safety awareness, balance, use of R side. Pt repeated again today that he realized that it was not okay to get out of the w/c by himself and he will not do that again.  Pt eager to get a shower.  Pt ambulated with RW to toilet with slight steadying assist. No cues needed today for safe positioning of R foot with sit >< stand.  Pt completed toileting and then transferred to shower. Today he only needed Supervision with bathing and dressing as he was demonstrating improved balance with no trunkal ataxia, leaning, or impulsive movements. Pt completed dressing, then groomed. The last 10 min of the session, he worked on standing balance exercise with no UE support and no LOB, maintaining good trunk control. Alternating reaches over head 20x BUE over head 10x Alternating reaches with alternating knee bend to reach to sides of knees 20x Squats 10x Quick release belt applied to chair, call light and phone in reach.  Therapy Documentation Precautions:  Precautions Precautions: Fall Precaution Comments: multiple falls at home; stated he falls up or down entry steps at home about once per month, due to "not knowing where his feet are" Restrictions Weight Bearing Restrictions: No   Pain: Pain Assessment Pain Assessment: No/denies  pain ADL: ADL ADL Comments: Refer to FIM   See FIM for current functional status  Therapy/Group: Individual Therapy  Treasure Ochs 11/28/2014, 9:44 AM

## 2014-11-28 NOTE — Progress Notes (Signed)
Orthopedic Tech Progress Note Patient Details:  Chad Fuller October 27, 1926 [id number]  Patient ID: Chad Fuller, male   DOB: 01-Jun-1926, 80 y.o.   MRN: [id number] Called in advanced brace order; spoke with Dyann Kief, Dodd Schmid 11/28/2014, 1:36 PM

## 2014-11-28 NOTE — Progress Notes (Signed)
Speech Language Pathology Weekly Progress and Session Note  Patient Details  Name: Chad Fuller MRN: [id number] Date of Birth: 1926-01-23  Beginning of progress report period: November 21, 2014 End of progress report period: November 28, 2014  Today's Date: 11/28/2014 SLP Individual Time: 1301-1401 SLP Individual Time Calculation (min): 60 min  Short Term Goals: Week 1: SLP Short Term Goal 1 (Week 1): Pt will tolerate presentations of his curently prescribed diet with no overt s/s of aspiration with supervision cues for use of swallowing precautions.  SLP Short Term Goal 1 - Progress (Week 1): Met SLP Short Term Goal 2 (Week 1): Pt will improve functional problem solving for basic tasks for 75% accuracy with mod assist.  SLP Short Term Goal 2 - Progress (Week 1): Met SLP Short Term Goal 3 (Week 1): Pt will improve use of compensatory strategies to facilitate recall of new information for 75% accuracy with mod assist  SLP Short Term Goal 3 - Progress (Week 1): Met SLP Short Term Goal 4 (Week 1): Pt will improve speech intelligibility at the conversational level to >90% with min cues for use of dysarthria strategies.  SLP Short Term Goal 4 - Progress (Week 1): Met SLP Short Term Goal 5 (Week 1): Pt will complete pharyngeal and oral motor strengthening exercises to improve right sided weakness and overall swallowing function with min cues  SLP Short Term Goal 5 - Progress (Week 1): Progressing toward goal    New Short Term Goals: Week 2: SLP Short Term Goal 1 (Week 2): Pt will tolerate presentations of upgraded consistencies with no overt s/s of aspiration with supervision cues for use of swallowing precautions.  SLP Short Term Goal 2 (Week 2): Pt will improve functional problem solving for basic tasks for 75% accuracy with min assist.  SLP Short Term Goal 3 (Week 2): Pt will improve use of compensatory strategies to facilitate recall of new information for 75% accuracy with min assist   SLP Short Term Goal 4 (Week 2): Pt will improve speech intelligibility at the conversational level to >90% with supervision cues for use of dysarthria strategies.  SLP Short Term Goal 5 (Week 2): Pt will complete pharyngeal and oral motor strengthening exercises to improve right sided weakness and overall swallowing function with min cues   Weekly Progress Updates:  Pt made functional gains this reporting period and has met 4 out of 5 short term goals due to improved use of swallowing precautions, improved functional problem solving, improved recall of new information, and improved speech intelligibility.  Pt currently requires overall mod assist for cognitive-linguistic tasks and requires supervision cues for use of swallowing precautions to minimize overt s/s of aspiration with dys 3 solid and nectar thick liquids via teaspoon.  Pt would continue to benefit from skilled ST while inpatient in order to maximize functional independence and reduce burden of care upon discharge.  Pt and family education is ongoing.  Continue to anticipate that pt will require 24/7 supervision and assistance for medication and financial management at discharge as well as follow up ST in either the home health or outpatient setting.     Intensity: Minumum of 1-2 x/day, 30 to 90 minutes Frequency: 5 out of 7 days Duration/Length of Stay: 14 days  Treatment/Interventions: Cognitive remediation/compensation;Dysphagia/aspiration precaution training;Functional tasks;Patient/family education;Oral motor exercises;Internal/external aids;Environmental controls   Daily Session  Skilled Therapeutic Interventions: Pt was seen for skilled ST targeting dysphagia goals and ongoing education.  Upon arrival, pt was seated upright in wheelchair,  awake, alert, and agreeable to participate in [place].  SLP completed skilled observations with presentations of pt's currently prescribed diet and trials of nectar thick liquids via cup sips during a  functional meal.  Pt exhibited exhibited intermittently wet vocal following mixed consistencies and consecutive cup sips of nectar thick liquids which indicates decreased airway protection.  Pt also was noted with delayed cough x2 which, while a sign of decreased laryngeal protection, could also be indicative of increased pharyngeal sensation which would be positive progress towards diet advancement.  As a result, SLP recommends repeat FEES at next available appointment to objectively determine readiness for liquids advancement.  Pt educated extensively regarding current progress in therapy and rationale behind follow up study.  SLP also strongly recommended that pt continue nectar thick liquids via teaspoon until objective study is completed.  Goals updated on this date to reflect current goals and progress in therapy.           FIM:  Comprehension Comprehension Mode: Auditory Comprehension: 5-Follows basic conversation/direction: With extra time/assistive device Expression Expression Mode: Verbal Expression: 5-Expresses basic 90% of the time/requires cueing < 10% of the time. Social Interaction Social Interaction: 5-Interacts appropriately 90% of the time - Needs monitoring or encouragement for participation or interaction. Problem Solving Problem Solving: 4-Solves basic 75 - 89% of the time/requires cueing 10 - 24% of the time Memory Memory: 3-Recognizes or recalls 50 - 74% of the time/requires cueing 25 - 49% of the time FIM - Eating Eating Activity: 5: Supervision/cues  Pain Pain Assessment Pain Assessment: No/denies pain  Therapy/Group: Individual Therapy  Markan Cazarez, Selinda Orion 11/28/2014, 3:45 PM

## 2014-11-28 NOTE — Patient Care Conference (Signed)
Inpatient RehabilitationTeam Conference and Plan of Care Update Date: 11/28/2014   Time: 10;40 AM    Patient Name: Chad Fuller      Medical Record Number: [id number]  Date of Birth: 05/20/1926 Sex: Male         Room/Bed: 4W21C/4W21C-01 Payor Info: Payor: MEDICARE / Plan: MEDICARE PART A AND B / Product Type: *No Product type* /    Admitting Diagnosis: Lung Ca   Admit Date/Time:  11/20/2014  5:59 PM Admission Comments: No comment available   Primary Diagnosis:  Left pontine CVA Principal Problem: Left pontine CVA  Patient Active Problem List   Diagnosis Date Noted  . Left pontine CVA 11/20/2014  . Dysphagia, post-stroke 11/20/2014  . TIA (transient ischemic attack) 11/19/2014  . Type 2 diabetes mellitus 11/19/2014  . Aortic heart murmur 11/19/2014  . Non-small cell carcinoma of lung 11/19/2014  . Recurrent falls 11/19/2014  . Dizziness and giddiness 11/19/2014  . CVA (cerebral infarction) 11/19/2014  . Right hemiparesis   . Type II or unspecified type diabetes mellitus with neurological manifestations, not stated as uncontrolled 11/14/2013  . Pain 11/14/2013  . Closed fracture of metatarsal bone(s) 10/10/2013  . ASTHMA 02/23/2009  . Coronary atherosclerosis 02/05/2009  . NEOPLASM, MALIGNANT, LUNG 02/04/2009  . DIABETES, TYPE 2 02/04/2009  . PULMONARY EMBOLISM 02/04/2009  . C O P D 02/04/2009  . DYSPNEA 02/04/2009    Expected Discharge Date: Expected Discharge Date: 12/05/14  Team Members Present: Physician leading conference: Dr. Alysia Penna Social Worker Present: Ovidio Kin, LCSW Nurse Present: Heather Roberts, RN PT Present: Georjean Mode, PT;Blair Hobble, PT SLP Present: Windell Moulding, SLP PPS Coordinator present : Daiva Nakayama, RN, CRRN     Current Status/Progress Goal Weekly Team Focus  Medical   Uncontrolled diabetes, poor fluid intake requiring IV fluids  Adequate by mouth caloric and fluid intake  Continue IV fluids at night, adjust diabetic medications    Bowel/Bladder   Continent of bowek and bladder. LBM 11/29  Pt to remain continent of bowel and bladder  Mod assist   Swallow/Nutrition/ Hydration   min assist for use of swallowing precautions  supervision with least restrictive diet   repeat objective swallow study, trials of advanced consistencies    ADL's   steady assist with self care due to decreased balance  supervision with basic ADLs and light home management due to decreased safety awareness  ADL retraining, functional mobility, balance activities, cognition, pt/family education   Mobility   Min A transfers, Min/Mod A gait x75' with RW, Mod A stairs, Berg 23/56 - high fall risk  S transfers, S gait x150' , S stairs x5  Standing balance and neuro re-ed, hip strengthening/stretching, gait training, stairs   Communication             Safety/Cognition/ Behavioral Observations  mild cognitive impairments; min-mod assist    min assist   safety awareness, semi-complex problem solving, memory    Pain   No c/o pain  <3  Assess for nonverbal cues of pain   Skin   Scattered abrasions and bruising  Remain free from infection/breakdown while on rehab  Assess skin q shift      *See Care Plan and progress notes for long and short-term goals.  Barriers to Discharge: See above    Possible Resolutions to Barriers:  See above    Discharge Planning/Teaching Needs:  Family plans to rotate and provide 24 hr care at discharge-daugther has been here but recovering from shoulder surgery  Team Discussion:  Decreased safety awareness-reason for supervision level at discharge. Diabetes issue is better more controlled. Need family to get a insulin pen-pt can't draw up own. AFO ordered and family bringing in his shoes. Working on upgrading his diet-FEE"s tomorrow.  Revisions to Treatment Plan:  Hopeful to upgrade diet after Fees tomorrow.   Continued Need for Acute Rehabilitation Level of Care: The patient requires daily medical management  by a physician with specialized training in physical medicine and rehabilitation for the following conditions: Daily direction of a multidisciplinary physical rehabilitation program to ensure safe treatment while eliciting the highest outcome that is of practical value to the patient.: Yes Daily medical management of patient stability for increased activity during participation in an intensive rehabilitation regime.: Yes Daily analysis of laboratory values and/or radiology reports with any subsequent need for medication adjustment of medical intervention for : Neurological problems;Other  Keera Altidor, Gardiner Rhyme 11/28/2014, 2:14 PM

## 2014-11-28 NOTE — Progress Notes (Signed)
80 year old male with history of DM type 2 with neuropathy, PE, NSCLC s/p resection, CAD who was admitted on 11/18/14 with slurred speech, dizziness and difficulty walking with fall. CT head without acute changes, generalized atrophy and right forehead contusion. CT cervical spine with stenosis C3/4 to C 5/6. MRI/MRA brain with Small acute brainstem infarct in the left paracentral pons and no significant stenosis.  2D echo with EF 60%, AV sclerosis and mitral annular calcification without stenosis. Carotid dopplers without significant stenosis. Neurology recommended ASA for thrombotic stroke due to SVD. Speech therapy evaluation with dysphagia and FEES done  Subjective/Complaints: Pt remembered CBG this am, pm value high yesterday Discussed DM management Review of Systems - Negative except as above Objective: Vital Signs: Blood pressure 134/68, pulse 78, temperature 98.3 F (36.8 C), temperature source Oral, resp. rate 18, weight 100.562 kg (221 lb 11.2 oz), SpO2 98 %. No results found. Results for orders placed or performed during the hospital encounter of 11/20/14 (from the past 72 hour(s))  Glucose, capillary     Status: Abnormal   Collection Time: 11/25/14 12:03 PM  Result Value Ref Range   Glucose-Capillary 115 (H) 70 - 99 mg/dL   Comment 1 Notify RN   Glucose, capillary     Status: Abnormal   Collection Time: 11/25/14  5:11 PM  Result Value Ref Range   Glucose-Capillary 142 (H) 70 - 99 mg/dL   Comment 1 Notify RN   Glucose, capillary     Status: Abnormal   Collection Time: 11/25/14  8:43 PM  Result Value Ref Range   Glucose-Capillary 196 (H) 70 - 99 mg/dL  Glucose, capillary     Status: Abnormal   Collection Time: 11/26/14  4:11 AM  Result Value Ref Range   Glucose-Capillary 56 (L) 70 - 99 mg/dL  Glucose, random     Status: None   Collection Time: 11/26/14  4:21 AM  Result Value Ref Range   Glucose, Bld 73 70 - 99 mg/dL  Glucose, capillary     Status: Abnormal   Collection  Time: 11/26/14  4:27 AM  Result Value Ref Range   Glucose-Capillary 65 (L) 70 - 99 mg/dL  Glucose, capillary     Status: None   Collection Time: 11/26/14  4:45 AM  Result Value Ref Range   Glucose-Capillary 93 70 - 99 mg/dL  Glucose, capillary     Status: Abnormal   Collection Time: 11/26/14  6:51 AM  Result Value Ref Range   Glucose-Capillary 143 (H) 70 - 99 mg/dL  Glucose, capillary     Status: Abnormal   Collection Time: 11/26/14 12:06 PM  Result Value Ref Range   Glucose-Capillary 132 (H) 70 - 99 mg/dL   Comment 1 Notify RN   Glucose, capillary     Status: Abnormal   Collection Time: 11/26/14  4:32 PM  Result Value Ref Range   Glucose-Capillary 177 (H) 70 - 99 mg/dL  Glucose, capillary     Status: Abnormal   Collection Time: 11/26/14  9:19 PM  Result Value Ref Range   Glucose-Capillary 160 (H) 70 - 99 mg/dL  Creatinine, serum     Status: Abnormal   Collection Time: 11/27/14  2:44 AM  Result Value Ref Range   Creatinine, Ser 0.63 0.50 - 1.35 mg/dL   GFR calc non Af Amer 87 (L) >90 mL/min   GFR calc Af Amer >90 >90 mL/min    Comment: (NOTE) The eGFR has been calculated using the CKD EPI equation.  This calculation has not been validated in all clinical situations. eGFR's persistently <90 mL/min signify possible Chronic Kidney Disease.   Glucose, capillary     Status: Abnormal   Collection Time: 11/27/14  3:26 AM  Result Value Ref Range   Glucose-Capillary 49 (L) 70 - 99 mg/dL  Glucose, capillary     Status: None   Collection Time: 11/27/14  4:04 AM  Result Value Ref Range   Glucose-Capillary 97 70 - 99 mg/dL  Glucose, capillary     Status: Abnormal   Collection Time: 11/27/14  6:47 AM  Result Value Ref Range   Glucose-Capillary 103 (H) 70 - 99 mg/dL  Glucose, capillary     Status: Abnormal   Collection Time: 11/27/14 11:31 AM  Result Value Ref Range   Glucose-Capillary 171 (H) 70 - 99 mg/dL  Glucose, capillary     Status: Abnormal   Collection Time: 11/27/14   4:31 PM  Result Value Ref Range   Glucose-Capillary 180 (H) 70 - 99 mg/dL  Glucose, capillary     Status: Abnormal   Collection Time: 11/27/14  9:06 PM  Result Value Ref Range   Glucose-Capillary 248 (H) 70 - 99 mg/dL  Glucose, capillary     Status: None   Collection Time: 11/28/14  6:57 AM  Result Value Ref Range   Glucose-Capillary 85 70 - 99 mg/dL     HEENT: normal Cardio: RRR and no murmurs Resp: CTA B/L and unlabored GI: BS positive and NT,ND Extremity:  Pulses positive and No Edema Skin:   Intact and Wound R forehead ecchymosis, faint, non tender Neuro: Alert/Oriented, Abnormal Sensory decreased LT sensation bilateral feet, Abnormal Motor 3- R delt, bi, tri, grip, HF, KE, ADF and Abnormal FMC Ataxic/ dec FMC, speech dysarthric, right facial droop. Impulsive, decreased insight and awareness Musc/Skel:  Other tightness bilateral hamstrings Gen NAD Able to propel WC  Assessment/Plan: 1. 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 Physiatrist is providing close team supervision and 24 hour management of active medical problems listed below. Physiatrist and rehab team continue to assess barriers to discharge/monitor patient progress toward functional and medical goals. Team conference today please see physician documentation under team conference tab, met with team face-to-face to discuss problems,progress, and goals. Formulized individual treatment plan based on medical history, underlying problem and comorbidities. FIM: FIM - Bathing Bathing Steps Patient Completed: Chest, Right Arm, Left Arm, Abdomen, Left upper leg, Right upper leg, Buttocks, Front perineal area, Left lower leg (including foot), Right lower leg (including foot) Bathing: 4: Steadying assist  FIM - Upper Body Dressing/Undressing Upper body dressing/undressing steps  patient completed: Thread/unthread right sleeve of pullover shirt/dresss, Thread/unthread left sleeve of pullover shirt/dress, Put head through opening of pull over shirt/dress, Pull shirt over trunk Upper body dressing/undressing: 5: Set-up assist to: Obtain clothing/put away FIM - Lower Body Dressing/Undressing Lower body dressing/undressing steps patient completed: Thread/unthread left pants leg, Pull pants up/down, Don/Doff left sock, Don/Doff left shoe, Fasten/unfasten left shoe, Thread/unthread right pants leg, Don/Doff right shoe, Don/Doff right sock, Fasten/unfasten right shoe Lower body dressing/undressing: 4: Steadying Assist  FIM - Toileting Toileting steps completed by patient: Adjust clothing prior to toileting, Performs perineal hygiene, Adjust clothing after toileting Toileting Assistive Devices: Grab bar or rail for support Toileting: 5: Supervision: Safety issues/verbal cues  FIM - Radio producer Devices: Grab bars, Insurance account manager Transfers: 4-To  toilet/BSC: Min A (steadying Pt. > 75%), 4-From toilet/BSC: Min A (steadying Pt. > 75%)  FIM - Bed/Chair Transfer Bed/Chair Transfer Assistive Devices: Walker, Arm rests Bed/Chair Transfer: 4: Bed > Chair or W/C: Min A (steadying Pt. > 75%), 4: Chair or W/C > Bed: Min A (steadying Pt. > 75%)  FIM - Locomotion: Wheelchair Distance: 75 Locomotion: Wheelchair: 2: Travels 50 - 149 ft with supervision, cueing or coaxing FIM - Locomotion: Ambulation Locomotion: Ambulation Assistive Devices: Administrator Ambulation/Gait Assistance: 4: Min assist Locomotion: Ambulation: 2: Travels 50 - 149 ft with minimal assistance (Pt.>75%)  Comprehension Comprehension Mode: Auditory Comprehension: 5-Follows basic conversation/direction: With extra time/assistive device  Expression Expression Mode: Verbal Expression: 5-Expresses basic 90% of the time/requires cueing < 10% of the time.  Social Interaction Social  Interaction: 5-Interacts appropriately 90% of the time - Needs monitoring or encouragement for participation or interaction.  Problem Solving Problem Solving Mode: Not assessed Problem Solving: 4-Solves basic 75 - 89% of the time/requires cueing 10 - 24% of the time  Memory Memory: 3-Recognizes or recalls 50 - 74% of the time/requires cueing 25 - 49% of the time  Medical Problem List and Plan: 1. Functional deficits secondary to Left paracentral pontine stroke 2.  DVT Prophylaxis/Anticoagulation: Pharmaceutical: Lovenox 3. Pain Management: Tylenol prn for back pain. Will add K pad additionally.   4. Mood: LCSW to follow for evaluation and support.   5. Neuropsych: This patient is capable of making decisions on his own behalf.  -waist belt in wheelchair, up at nurses station prn for safety 6. Skin/Wound Care: Routine pressure relief measures. Rehab RN to monitor skin daily 7. Fluids/Electrolytes/Nutrition: Monitor I/O. Offer nutritional supplement prn if intake poor.IVF   8. DM type 2 with neuropathy: Will monitor BS with ac/hs checks. reduce lantus to 50 units and resumed metformin  -continue 850 metformin in am and increase pm dose to 84m 9.  BPH: Continue proscar. Monitor for any voiding difficulty.    10.  HTN: Will monitor blood pressure tid. Currently controlled off HCTZ.  intake improving, BPs rising will restart low dose 11. GERD: will add Pepcid for symptoms.     LOS (Days) 8 A FACE TO FACE EVALUATION WAS PERFORMED  KIRSTEINS,ANDREW E 11/28/2014, 8:18 AM

## 2014-11-28 NOTE — Progress Notes (Signed)
Social Work Patient ID: Chad Fuller, male   DOB: 04-26-1926, 80 y.o.   MRN: [id number] Met with pt and three daughter's to discuss again team conferecnc goals and discharge.  Encouraged them to attend therapies with him to see the safety issues the therapists see. Discussed alternatives and hiring assistance, pt wants to stay alone at night.  Will see swallow test results tomorrow and work with all three on discharge plans.

## 2014-11-28 NOTE — Progress Notes (Signed)
Social Work Patient ID: Chad Fuller, male   DOB: [birth year], 80 y.o.   MRN: [id number] Met with pt and spoke with [name]-daughter via telephone to discuss team conference goals-supervision level and discharge 12/9.  Happy with the progress he has made and will come in and Attend therapies with him prior to discharge.  They plan to rotate to provide care for him at home.  Bringing in shoes and AFO ordered for discharge.  Work toward discharge next week.

## 2014-11-28 NOTE — Progress Notes (Signed)
Physical Therapy Session Note  Patient Details  Name: Chad Fuller MRN: [id number] Date of Birth: Apr 27, 1926  Today's Date: 11/28/2014 PT Individual Time: 0804-0902 PT Individual Time Calculation (min): 58 min   Short Term Goals: Week 2:  PT Short Term Goal 1 (Week 2): Pt will perform basic transfers with S 3/5 trials PT Short Term Goal 2 (Week 2): Pt will perform gait with RW x150 and min A PT Short Term Goal 3 (Week 2): Pt will ascend/descend 5 stairs with S PT Short Term Goal 4 (Week 2): Pt will improve Berg Balance test score by>6 points to demonstrate fall risk reduction PT Short Term Goal 5 (Week 2): Pt will initiate seated HS stretch with handout at start of each PT tx with S for safety.   Skilled Therapeutic Interventions/Progress Updates:  neuromuscular re-education via VCS, forced use, self stretching, demo for: -wc propulsion x 150' with distant supervision, using bil UEs, Theraband wrap on R wc rim for increased grip -self stretching bil hamstrings and heel cords in sitting and standing on wedge -bil hip adduction IR against resistance to improve alignment of hips and widen BOS -standing on compliant mat to activate muscles for balance reactions -bil hand use for fine motor activity in sitting, biased toward anterior pelvic tilt and wide BOS of feet -gait up/down 5 steps 2 rails min guard assist, mod cues for sequence; difficulty noted with clearing R foot, which pt appeared unaware of -gait with Rw x 150' with min guard assist, in controlled env, level tile, max cues at times to slow down, keep feet within Rw, widen BOS, extend trunk    Pt min/mod assist for gait in congested room, in order to motor plan turning RW and stepping sideways.  Pt left resting in wc, all needs iwthin reach  Therapy Documentation Precautions:  Precautions Precautions: Fall Precaution Comments: multiple falls at home; stated he falls up or down entry steps at home about once per month, due to  "not knowing where his feet are" Restrictions Weight Bearing Restrictions: No   Pain: Pain Assessment Pain Assessment: No/denies pain   Locomotion : Ambulation Ambulation/Gait Assistance: 4: Min assist Wheelchair Mobility Distance: 150       See FIM for current functional status  Therapy/Group: Individual Therapy  Dane Bloch 11/28/2014, 9:36 AM

## 2014-11-28 NOTE — Progress Notes (Signed)
Social Work Elease Hashimoto, LCSW Social Worker Signed  Patient Care Conference 11/28/2014  2:13 PM    Expand All Collapse All   Inpatient RehabilitationTeam Conference and Plan of Care Update Date: 11/28/2014   Time: 10;40 AM     Patient Name: Chad Fuller       Medical Record Number: [id number]  Date of Birth: 04-11-1926 Sex: Male         Room/Bed: 4W21C/4W21C-01 Payor Info: Payor: MEDICARE / Plan: MEDICARE PART A AND B / Product Type: *No Product type* /    Admitting Diagnosis: Lung Ca    Admit Date/Time:  11/20/2014  5:59 PM Admission Comments: No comment available   Primary Diagnosis:  Left pontine CVA Principal Problem: Left pontine CVA    Patient Active Problem List     Diagnosis  Date Noted   .  Left pontine CVA  11/20/2014   .  Dysphagia, post-stroke  11/20/2014   .  TIA (transient ischemic attack)  11/19/2014   .  Type 2 diabetes mellitus  11/19/2014   .  Aortic heart murmur  11/19/2014   .  Non-small cell carcinoma of lung  11/19/2014   .  Recurrent falls  11/19/2014   .  Dizziness and giddiness  11/19/2014   .  CVA (cerebral infarction)  11/19/2014   .  Right hemiparesis     .  Type II or unspecified type diabetes mellitus with neurological manifestations, not stated as uncontrolled  11/14/2013   .  Pain  11/14/2013   .  Closed fracture of metatarsal bone(s)  10/10/2013   .  ASTHMA  02/23/2009   .  Coronary atherosclerosis  02/05/2009   .  NEOPLASM, MALIGNANT, LUNG  02/04/2009   .  DIABETES, TYPE 2  02/04/2009   .  PULMONARY EMBOLISM  02/04/2009   .  C O P D  02/04/2009   .  DYSPNEA  02/04/2009     Expected Discharge Date: Expected Discharge Date: 12/05/14  Team Members Present: Physician leading conference: Dr. Alysia Penna Social Worker Present: Ovidio Kin, LCSW Nurse Present: Heather Roberts, RN PT Present: Georjean Mode, PT;Blair Hobble, PT SLP Present: Windell Moulding, SLP PPS Coordinator present : Daiva Nakayama, RN, CRRN        Current  Status/Progress  Goal  Weekly Team Focus   Medical     Uncontrolled diabetes, poor fluid intake requiring IV fluids   Adequate by mouth caloric and fluid intake   Continue IV fluids at night, adjust diabetic medications   Bowel/Bladder     Continent of bowek and bladder. LBM 11/29   Pt to remain continent of bowel and bladder   Mod assist   Swallow/Nutrition/ Hydration     min assist for use of swallowing precautions  supervision with least restrictive diet   repeat objective swallow study, trials of advanced consistencies    ADL's     steady assist with self care due to decreased balance   supervision with basic ADLs and light home management due to decreased safety awareness  ADL retraining, functional mobility, balance activities, cognition, pt/family education   Mobility     Min A transfers, Min/Mod A gait x75' with RW, Mod A stairs, Berg 23/56 - high fall risk  S transfers, S gait x150' , S stairs x5   Standing balance and neuro re-ed, hip strengthening/stretching, gait training, stairs   Communication               Safety/Cognition/  Behavioral Observations    mild cognitive impairments; min-mod assist    min assist   safety awareness, semi-complex problem solving, memory    Pain     No c/o pain  <3  Assess for nonverbal cues of pain   Skin     Scattered abrasions and bruising  Remain free from infection/breakdown while on rehab   Assess skin q shift      *See Care Plan and progress notes for long and short-term goals.    Barriers to Discharge:  See above     Possible Resolutions to Barriers:   See above     Discharge Planning/Teaching Needs:   Family plans to rotate and provide 24 hr care at discharge-daugther has been here but recovering from shoulder surgery        Team Discussion:    Decreased safety awareness-reason for supervision level at discharge. Diabetes issue is better more controlled. Need family to get a insulin pen-pt can't draw up own. AFO ordered and  family bringing in his shoes. Working on upgrading his diet-FEE"s tomorrow.   Revisions to Treatment Plan:    Hopeful to upgrade diet after Fees tomorrow.    Continued Need for Acute Rehabilitation Level of Care: The patient requires daily medical management by a physician with specialized training in physical medicine and rehabilitation for the following conditions: Daily direction of a multidisciplinary physical rehabilitation program to ensure safe treatment while eliciting the highest outcome that is of practical value to the patient.: Yes Daily medical management of patient stability for increased activity during participation in an intensive rehabilitation regime.: Yes Daily analysis of laboratory values and/or radiology reports with any subsequent need for medication adjustment of medical intervention for : Neurological problems;Other  Elease Hashimoto 11/28/2014, 2:14 PM                  Patient ID: Chad Fuller, male   DOB: April 13, 1926, 80 y.o.   MRN: [id number]

## 2014-11-28 NOTE — Plan of Care (Signed)
Problem: RH BOWEL ELIMINATION Goal: RH STG MANAGE BOWEL WITH ASSISTANCE STG Manage Bowel with Assistance. Mod I  Outcome: Progressing Goal: RH STG MANAGE BOWEL W/MEDICATION W/ASSISTANCE STG Manage Bowel with Medication with min Assistance.  Outcome: Progressing  Problem: RH BLADDER ELIMINATION Goal: RH STG MANAGE BLADDER WITH ASSISTANCE STG Manage Bladder With Assistance. Mod I  Outcome: Progressing  Problem: RH SKIN INTEGRITY Goal: RH STG SKIN FREE OF INFECTION/BREAKDOWN Skin to remain free from infection/breakdown while on rehab with min assist  Outcome: Progressing  Problem: RH SAFETY Goal: RH STG ADHERE TO SAFETY PRECAUTIONS W/ASSISTANCE/DEVICE STG Adhere to Safety Precautions With Assistance/Device. Supervision  Outcome: Progressing  Problem: RH PAIN MANAGEMENT Goal: RH STG PAIN MANAGED AT OR BELOW PT'S PAIN GOAL <4  Outcome: Progressing

## 2014-11-29 ENCOUNTER — Inpatient Hospital Stay (HOSPITAL_COMMUNITY): Payer: Medicare Other | Admitting: Speech Pathology

## 2014-11-29 ENCOUNTER — Inpatient Hospital Stay (HOSPITAL_COMMUNITY): Payer: Medicare Other

## 2014-11-29 ENCOUNTER — Encounter (HOSPITAL_COMMUNITY): Payer: Medicare Other

## 2014-11-29 ENCOUNTER — Inpatient Hospital Stay (HOSPITAL_COMMUNITY): Payer: Medicare Other | Admitting: Physical Therapy

## 2014-11-29 LAB — GLUCOSE, CAPILLARY
GLUCOSE-CAPILLARY: 176 mg/dL — AB (ref 70–99)
GLUCOSE-CAPILLARY: 232 mg/dL — AB (ref 70–99)
Glucose-Capillary: 51 mg/dL — ABNORMAL LOW (ref 70–99)
Glucose-Capillary: 65 mg/dL — ABNORMAL LOW (ref 70–99)
Glucose-Capillary: 84 mg/dL (ref 70–99)
Glucose-Capillary: 143 mg/dL — ABNORMAL HIGH (ref 70–99)
Glucose-Capillary: 178 mg/dL — ABNORMAL HIGH (ref 70–99)
Glucose-Capillary: 211 mg/dL — ABNORMAL HIGH (ref 70–99)

## 2014-11-29 MED ORDER — GLUCOSE 40 % PO GEL
ORAL | Status: AC
  Administered 2014-11-29: 37.5 g
  Filled 2014-11-29: qty 1

## 2014-11-29 MED ORDER — METFORMIN HCL 500 MG PO TABS
500.0000 mg | ORAL_TABLET | Freq: Every day | ORAL | Status: DC
  Administered 2014-11-29 – 2014-12-04 (×6): 500 mg via ORAL
  Filled 2014-11-29 (×7): qty 1

## 2014-11-29 NOTE — Progress Notes (Addendum)
Occupational Therapy Session Note  Patient Details  Name: Chad Fuller MRN: [id number] Date of Birth: 26-Jan-1926  Today's Date: 11/29/2014 OT Individual Time: 1003-1103 OT Individual Time Calculation (min): 60 min    Short Term Goals: Week 1:  OT Short Term Goal 1 (Week 1): Pt will ambulate to toilet with close S with RW. OT Short Term Goal 2 (Week 1): Pt will don his pants over his R foot. OT Short Term Goal 3 (Week 1): Pt will don sock and shoe on R foot.  OT Short Term Goal 4 (Week 1): Pt will stand at sink to groom with min A.  Skilled Therapeutic Interventions/Progress Updates:    Pt engaged in BADL retraining including bathing at shower level and dressing with sit<>stand from w/c. Pt amb with RW to bathroom to use toilet prior to transferring to shower.  Pt required min A for ambulation and transfers.  Pt completed bathing and dressing tasks at supervision level.  Pt required min verbal cues for safety awareness with sit<>stand.  Pt requires extra time to complete all tasks.  Focus on activity tolerance, sit<>stand, standing balance, functional amb with RW, and safety awareness.  Therapy Documentation Precautions:  Precautions Precautions: Fall Precaution Comments: multiple falls at home; stated he falls up or down entry steps at home about once per month, due to "not knowing where his feet are" Restrictions Weight Bearing Restrictions: No   Pain: Pain Assessment Pain Assessment: No/denies pain Pain Score: Asleep ADL: ADL ADL Comments: Refer to FIM  See FIM for current functional status  Therapy/Group: Individual Therapy  Leroy Libman 11/29/2014, 11:05 AM

## 2014-11-29 NOTE — Plan of Care (Signed)
Problem: RH BOWEL ELIMINATION Goal: RH STG MANAGE BOWEL WITH ASSISTANCE STG Manage Bowel with Assistance. Mod I  Outcome: Not Progressing LBM 11-26-14 Goal: RH STG MANAGE BOWEL W/MEDICATION W/ASSISTANCE STG Manage Bowel with Medication with min Assistance.  Outcome: Not Progressing LBM 11-26-14  Problem: RH BLADDER ELIMINATION Goal: RH STG MANAGE BLADDER WITH ASSISTANCE STG Manage Bladder With Assistance. Mod I  Outcome: Progressing  Problem: RH SKIN INTEGRITY Goal: RH STG SKIN FREE OF INFECTION/BREAKDOWN Skin to remain free from infection/breakdown while on rehab with min assist  Outcome: Progressing  Problem: RH SAFETY Goal: RH STG ADHERE TO SAFETY PRECAUTIONS W/ASSISTANCE/DEVICE STG Adhere to Safety Precautions With Assistance/Device. Supervision  Outcome: Progressing  Problem: RH PAIN MANAGEMENT Goal: RH STG PAIN MANAGED AT OR BELOW PT'S PAIN GOAL <4  Outcome: Progressing

## 2014-11-29 NOTE — Progress Notes (Signed)
Recreational Therapy Session Note  Patient Details  Name: Chad Fuller MRN: [id number] Date of Birth: [birth year] Today's Date: 11/29/2014  LATE ENTRY from 11/28/14 Pain: no c/o Skilled Therapeutic Interventions/Progress Updates: Session focused on activity tolerance, dynamic standing balance, toileting, safety awareness, & family education/dicharge planning with 2 daughters.  Pt ambulated with RW in room to bathroom with close supervision, min cues for safety & on unit with close supervision-min assist & min cues for safety. 2 daughters present in the room & with multiple questions about pt status, therapy goals, discharge planning in regards to need for supervision.  Education provided on pt's current status, long terms goals & recommendations at discharge.  Both appreciative of information.  Becky, SW made aware of the above & to assist with further questions.  Therapy/Group: Individual Therapy  [name] 11/29/2014, 3:34 PM

## 2014-11-29 NOTE — Progress Notes (Signed)
Physical Therapy Session Note  Patient Details  Name: Chad Fuller MRN: [id number] Date of Birth: Jan 03, 1926  Today's Date: 11/29/2014 PT Individual Time: 1300-1330 PT Individual Time Calculation (min): 30 min   Short Term Goals: Week 2:  PT Short Term Goal 1 (Week 2): Pt will perform basic transfers with S 3/5 trials PT Short Term Goal 2 (Week 2): Pt will perform gait with RW x150 and min A PT Short Term Goal 3 (Week 2): Pt will ascend/descend 5 stairs with S PT Short Term Goal 4 (Week 2): Pt will improve Berg Balance test score by>6 points to demonstrate fall risk reduction PT Short Term Goal 5 (Week 2): Pt will initiate seated HS stretch with handout at start of each PT tx with S for safety.   Skilled Therapeutic Interventions/Progress Updates:    Therapeutic Exercise: PT completes hamstring stretch in sitting 3 x 1 minute each LE, while PT also performs A -> P tibial mobilizations in order to stretch out pt's posterior knee capsule.   Therapeutic Activity: PT instructs pt in repeated sit to stands from w/c with hand hold assist from PT x 10 reps req min A initially, progressing to CGA.  PT instructs pt in repeated stand-step transfers from w/c to/from bed without AD req HHA and CGA, progressing to close SBA on last rep x 10 reps.  Pt is doing well with functional mobility including sit to stands and stand step transfers without AD (except w/c armrests). Pt's posterior knee capsules are extremely tight bilaterally, likely from chronic sitting position in premorbid status. Pt will benefit from continued PT to maximize safe functional independence.   Therapy Documentation Precautions:  Precautions Precautions: Fall Precaution Comments: multiple falls at home; stated he falls up or down entry steps at home about once per month, due to "not knowing where his feet are" Restrictions Weight Bearing Restrictions: No Pain: Pain Assessment Pain Assessment: No/denies pain  See FIM for  current functional status  Therapy/Group: Individual Therapy  [name] M 11/29/2014, 1:05 PM

## 2014-11-29 NOTE — Procedures (Signed)
Objective Swallowing Evaluation: Fiberoptic Endoscopic Evaluation of Swallowing  Patient Details  Name: Chad Fuller MRN: [id number] Date of Birth: [birth year]  Today's Date: 11/29/2014 Time:  -     Past Medical History:  Past Medical History  Diagnosis Date  . Diabetes mellitus   . Cancer     lung  . Prostate enlargement   . History of blood clots   . CAD (coronary artery disease)   . Pulmonary embolus july 2005   Past Surgical History:  Past Surgical History  Procedure Laterality Date  . Lung removal, partial    . Coronary stent placement    . Knee surgery    . Filtering procedure      reports filter placed after knee surgery for blood clots  . I&d extremity  08/16/2012    Procedure: MINOR IRRIGATION AND DEBRIDEMENT EXTREMITY;  Surgeon: Tennis Must, MD;  Location: [facility];  Service: Orthopedics;  Laterality: Right;   HPI:  80 yo male admitted from home due to slurred speech, dizziness/ light headed, mild headache with R forehead bruise. pt s/p multiple fall. Baseline independent with ADLS PMH: DM, lung CA, CAD, PE, Lung partial removal, Knee surg, Bell's Pals. 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        Recommendation/Prognosis  Clinical Impression:   Dysphagia Diagnosis: Moderate pharyngeal phase dysphagia;Mild oral phase dysphagia Clinical impression: Pt demonstrates little to no change in swallow function from prior FEES; attempted compensatory strategies continue to be ineffective. Initially, pt had a more timely swallow with cup sips of nectar with no penetration seen. As study progressed however pt aspirated thin liquids, then nectar thick liquids via cup as well, during the swallow due to decreased airway closure and progressively worsened timing of the swallow. Pt had perhaps one instance of sensation of aspirate out of 10 occurrances. He was unable to clear despite cues for cough and throat clear due to appearance  of bowed vocal folds (age related). Quantity of aspirate and penetrate appeared minimal; would consider implementation of a water protocol to improve QOL. Otherwise pt to continue Dys 3 (mechanical soft) with nectar via teaspoon to reduce risk of aspiration.    Swallow Evaluation Recommendations:  Diet Recommendations: Dysphagia 3 (Mechanical Soft);Nectar-thick liquid Liquid Administration via: Spoon Medication Administration: Whole meds with puree Supervision: Patient able to self feed;Full supervision/cueing for compensatory strategies Compensations: Slow rate;Small sips/bites;Check for pocketing Postural Changes and/or Swallow Maneuvers: Seated upright 90 degrees Oral Care Recommendations: Oral care BID Other Recommendations: Order thickener from pharmacy;Prohibited food (jello, ice cream, thin soups);Remove water pitcher    Prognosis:      Individuals Consulted: Consulted and Agree with Results and Recommendations: Patient;Family member/caregiver      SLP Assessment/Plan  Plan:      Short Term Goals: Week 2: SLP Short Term Goal 1 (Week 2): Pt will tolerate presentations of upgraded consistencies with no overt s/s of aspiration with supervision cues for use of swallowing precautions.  SLP Short Term Goal 2 (Week 2): Pt will improve functional problem solving for basic tasks for 75% accuracy with min assist.  SLP Short Term Goal 3 (Week 2): Pt will improve use of compensatory strategies to facilitate recall of new information for 75% accuracy with min assist  SLP Short Term Goal 4 (Week 2): Pt will improve speech intelligibility at the conversational level to >90% with supervision cues for use of dysarthria strategies.  SLP Short Term Goal 5 (Week  2): Pt will complete pharyngeal and oral motor strengthening exercises to improve right sided weakness and overall swallowing function with min cues     General: Type of Study: Fiberoptic Endoscopic Evaluation of Swallowing Reason for  Referral: Objectively evaluate swallowing function Previous Swallow Assessment: FEES 11/24 recommending Dys 3, nectar thick liquids via teaspoon  Diet Prior to this Study: Dysphagia 3 (soft);Nectar-thick liquids Temperature Spikes Noted: No Respiratory Status: Room air History of Recent Intubation: No Behavior/Cognition: Alert;Cooperative;Pleasant mood Oral Cavity - Dentition: Adequate natural dentition Oral Motor / Sensory Function: Impaired - see Bedside swallow eval Self-Feeding Abilities: Able to feed self Patient Positioning: Upright in chair Baseline Vocal Quality: Clear Volitional Cough: Strong Volitional Swallow: Able to elicit Anatomy: Other (Comment) (bowed vocal folds- presbylaryngis) Pharyngeal Secretions: Normal   Reason for Referral:   Objectively evaluate swallowing function    Oral Phase: Oral Preparation/Oral Phase Oral Phase: Impaired Oral - Honey Oral - Honey Teaspoon: Not tested Oral - Nectar Oral - Nectar Teaspoon: Within functional limits Oral - Nectar Cup: Other (Comment);Lingual/palatal residue (residuals fall to valleculae post swallow) Oral - Thin Oral - Thin Cup: Incomplete tongue to palate contact;Lingual/palatal residue;Other (Comment) (unable to orally contain bolus with cues, spills to pyriform) Oral - Solids Oral - Puree: Not tested Oral - Mechanical Soft: Within functional limits   Pharyngeal Phase:  Pharyngeal Phase Pharyngeal Phase: Impaired Pharyngeal - Honey Pharyngeal - Honey Teaspoon: Not tested Pharyngeal - Nectar Pharyngeal - Nectar Teaspoon: Premature spillage to valleculae;Reduced pharyngeal peristalsis;Reduced epiglottic inversion;Reduced laryngeal elevation;Reduced airway/laryngeal closure;Reduced tongue base retraction;Delayed swallow initiation Pharyngeal - Nectar Cup: Premature spillage to valleculae;Reduced pharyngeal peristalsis;Reduced epiglottic inversion;Reduced laryngeal elevation;Reduced airway/laryngeal closure;Reduced  tongue base retraction;Delayed swallow initiation;Penetration/Aspiration during swallow;Trace aspiration;Pharyngeal residue - valleculae;Pharyngeal residue - pyriform sinuses;Pharyngeal residue - posterior pharnyx Penetration/Aspiration details (nectar cup): Material enters airway, passes BELOW cords without attempt by patient to eject out (silent aspiration);Material enters airway, CONTACTS cords and not ejected out;Material does not enter airway Pharyngeal - Thin Pharyngeal - Thin Cup: Premature spillage to valleculae;Reduced pharyngeal peristalsis;Reduced epiglottic inversion;Reduced laryngeal elevation;Reduced airway/laryngeal closure;Reduced tongue base retraction;Delayed swallow initiation;Penetration/Aspiration during swallow;Trace aspiration;Pharyngeal residue - valleculae;Pharyngeal residue - pyriform sinuses;Pharyngeal residue - posterior pharnyx Penetration/Aspiration details (thin cup): Material enters airway, passes BELOW cords without attempt by patient to eject out (silent aspiration) Pharyngeal - Solids Pharyngeal - Puree: Not tested Pharyngeal - Mechanical Soft: Within functional limits   Cervical Esophageal Phase      GN         Herbie Baltimore, MA CCC-SLP [phone]  Lynann Beaver 11/29/2014, 10:12 AM

## 2014-11-29 NOTE — Progress Notes (Signed)
Speech Language Pathology Daily Session Note  Patient Details  Name: Chad Fuller MRN: [id number] Date of Birth: Jun 26, 1926  Today's Date: 11/29/2014 SLP Individual Time: 1655-3748 SLP Individual Time Calculation (min): 30 min  Short Term Goals: Week 2: SLP Short Term Goal 1 (Week 2): Pt will tolerate presentations of upgraded consistencies with no overt s/s of aspiration with supervision cues for use of swallowing precautions.  SLP Short Term Goal 2 (Week 2): Pt will improve functional problem solving for basic tasks for 75% accuracy with min assist.  SLP Short Term Goal 3 (Week 2): Pt will improve use of compensatory strategies to facilitate recall of new information for 75% accuracy with min assist  SLP Short Term Goal 4 (Week 2): Pt will improve speech intelligibility at the conversational level to >90% with supervision cues for use of dysarthria strategies.  SLP Short Term Goal 5 (Week 2): Pt will complete pharyngeal and oral motor strengthening exercises to improve right sided weakness and overall swallowing function with min cues   Skilled Therapeutic Interventions:  Pt was seen for skilled ST targeting education related to results of FEES.  Per report from evaluating therapist, pt continues to present with decreased sensation of tracheal aspiration and is unable to clear aspirates/penetrates with cuing for cough; however, pt may benefit from trials of regular water via teaspoons following thorough oral care per the water protocol.  Pt aware of results of study (continue nectar thick liquids via teaspoons).  SLP provided skilled education related to the parameters of the water protocol, including trials of water only (i.e. No coffee, teas, sodas, or juices), trials in between meals only, and rationale behind oral care.    Pt verbalized understanding.  Signs placed in room and at head of bed.  RN and nurse tech made aware.  No trials of water completed during today's session due to session  occuring <30 minutes after swallowing study where PO was given.  Continue per plan of care.   FIM:  Comprehension Comprehension Mode: Auditory Comprehension: 5-Follows basic conversation/direction: With extra time/assistive device Expression Expression Mode: Verbal Expression: 5-Expresses basic 90% of the time/requires cueing < 10% of the time. Social Interaction Social Interaction: 5-Interacts appropriately 90% of the time - Needs monitoring or encouragement for participation or interaction. Problem Solving Problem Solving: 4-Solves basic 75 - 89% of the time/requires cueing 10 - 24% of the time Memory Memory: 4-Recognizes or recalls 75 - 89% of the time/requires cueing 10 - 24% of the time  Pain Pain Assessment Pain Assessment: No/denies pain Pain Score: Asleep  Therapy/Group: Individual Therapy   Windell Moulding, M.A. CCC-SLP  Paytin Ramakrishnan, Elmyra Ricks L 11/29/2014, 11:20 AM

## 2014-11-29 NOTE — Plan of Care (Signed)
Problem: RH BOWEL ELIMINATION Goal: RH STG MANAGE BOWEL WITH ASSISTANCE STG Manage Bowel with Assistance. Mod I  Outcome: Progressing Goal: RH STG MANAGE BOWEL W/MEDICATION W/ASSISTANCE STG Manage Bowel with Medication with min Assistance.  Outcome: Progressing  Problem: RH BLADDER ELIMINATION Goal: RH STG MANAGE BLADDER WITH ASSISTANCE STG Manage Bladder With Assistance. Mod I  Outcome: Progressing  Problem: RH SKIN INTEGRITY Goal: RH STG SKIN FREE OF INFECTION/BREAKDOWN Skin to remain free from infection/breakdown while on rehab with min assist  Outcome: Progressing  Problem: RH SAFETY Goal: RH STG ADHERE TO SAFETY PRECAUTIONS W/ASSISTANCE/DEVICE STG Adhere to Safety Precautions With Assistance/Device. Supervision  Outcome: Progressing  Problem: RH PAIN MANAGEMENT Goal: RH STG PAIN MANAGED AT OR BELOW PT'S PAIN GOAL <4  Outcome: Progressing  Problem: RH KNOWLEDGE DEFICIT Goal: RH STG INCREASE KNOWLEDGE OF DIABETES Patient will state s/s of hypo/hyperglycemia, dietary and medication management with mod assist  Outcome: Progressing

## 2014-11-29 NOTE — Progress Notes (Addendum)
Hypoglycemic Event  CBG: 51  Treatment: 15 GM carbohydrate snack  Symptoms: Shaky  Follow-up CBG: Time:319/400 CBG Result:65/84  Possible Reasons for Event: Inadequate meal intake  Comments/MD notified:note placed in chart    Chad Fuller L  Remember to initiate Hypoglycemia Order Set & complete

## 2014-11-29 NOTE — Progress Notes (Signed)
Inpatient Diabetes Program Recommendations  AACE/ADA: New Consensus Statement on Inpatient Glycemic Control (2013)  Target Ranges:  Prepandial:   less than 140 mg/dL      Peak postprandial:   less than 180 mg/dL (1-2 hours)      Critically ill patients:  140 - 180 mg/dL   Reason for assessment- low blood sugar this am  Diabetes history: Type 2  Outpatient Diabetes medications: Lantus 70 units daily, Metformin 500mg  qam Current orders for Inpatient glycemic control: Lantus 50 units daily, Metformin 850mg  qam and Novolog sensitive correction.   Hypoglycemic event this morning (2:44am).  No reduction in diabetes medications today.  Please consider decreasing Lantus tomorrow if fasting blood sugars are below 140mg /dl.   Gentry Fitz, RN, BA, MHA, CDE Diabetes Coordinator Inpatient Diabetes Program  [phone] (Team Pager) [phone] [facility]) 11/29/2014 5:30 PM

## 2014-11-29 NOTE — Progress Notes (Signed)
Social Work Patient ID: Chad Fuller, male   DOB: 15-Jan-1926, 80 y.o.   MRN: [id number] Met with three daughters of pt and then pt to discuss concerns regarding providing 24 hr care at discharge.  Daughter's feel with the health issues each of them have they can not Provide 24 hr care and would like to pursue NHP.  Aware pt needs to agree to this and this worker has talked with pt regarding this.  He wants to think about it and will get back with me. He feels he is walking well and could stay alone at night and his swallow is better than the staff thinks it is.  Daughter's report pt has a history of telling our staff  one thing and them something else. There is long standing issues regarding the daughter's and pt's relationships with one another.  Will work with pt and daughter's on a safe discharge plan. Will see pt tomorrow.  Daughter's aware conversation Has been started with pt regarding NHP.

## 2014-11-29 NOTE — Progress Notes (Signed)
Physical Therapy Session Note  Patient Details  Name: Chad Fuller MRN: [id number] Date of Birth: 1926/10/31  Today's Date: 11/29/2014 PT Individual Time: Treatment Session 1: 3500-9381; Treatment Session 2: 1445-1500 (Make-up session) PT Individual Time Calculation (min): Treatment Session 1: 15min; Treatment Session 2: 15 min (Make-up session)  Short Term Goals: Week 2:  PT Short Term Goal 1 (Week 2): Pt will perform basic transfers with S 3/5 trials PT Short Term Goal 2 (Week 2): Pt will perform gait with RW x150 and min A PT Short Term Goal 3 (Week 2): Pt will ascend/descend 5 stairs with S PT Short Term Goal 4 (Week 2): Pt will improve Berg Balance test score by>6 points to demonstrate fall risk reduction PT Short Term Goal 5 (Week 2): Pt will initiate seated HS stretch with handout at start of each PT tx with S for safety.   Skilled Therapeutic Interventions/Progress Updates:  Treatment Session 1:  1:1. Pt received sitting in w/c, ready for therapy. Chris from [facility] present to formally assess pt for AFO. Pt amb 100'x1 with RW, without AFO, req mod A and gait characterized by R genu varum, R pronation, very narrow BOS with R foot catching on L heel during initial swing phase and intermittent R toe slap. Trial R anterior [place] Walk On AFO during amb 100'x1 with RW, req min-mod A, pt demonstrating overall significantly improved quality of gait pattern and decreased need for physical assist. With AFO, gait characterized by decreased R pronation, increased BOS and absent R toe slap.   Treatment Session 2:  Continuation of tx session 1 as make-up session.  Focus on safety, hand placement and anterior weight shift during t/f sit<>stand x5 with RW and min guard-min A. Pt propelled w/c back to room at end of session 175'x1 with B UE demonstrating slow but steady pace, req supervision-occasional min A for steering. Pt left sitting in w/c at end of session w/ all needs in reach and quick  release belt in place.    Therapy Documentation Precautions:  Precautions Precautions: Fall Precaution Comments: multiple falls at home; stated he falls up or down entry steps at home about once per month, due to "not knowing where his feet are" Restrictions Weight Bearing Restrictions: No  See FIM for current functional status  Therapy/Group: Individual Therapy  Gilmore Laroche 11/29/2014, 5:04 PM

## 2014-11-29 NOTE — Progress Notes (Signed)
80 year old male with history of DM type 2 with neuropathy, PE, NSCLC s/p resection, CAD who was admitted on 11/18/14 with slurred speech, dizziness and difficulty walking with fall. CT head without acute changes, generalized atrophy and right forehead contusion. CT cervical spine with stenosis C3/4 to C 5/6. MRI/MRA brain with Small acute brainstem infarct in the left paracentral pons and no significant stenosis.  2D echo with EF 60%, AV sclerosis and mitral annular calcification without stenosis. Carotid dopplers without significant stenosis. Neurology recommended ASA for thrombotic stroke due to SVD. Speech therapy evaluation with dysphagia and FEES done  Subjective/Complaints: Pt remembered CBG this am, pm value high yesterday Discussed DM management Review of Systems - Negative except as above Objective: Vital Signs: Blood pressure 139/78, pulse 87, temperature 98.4 F (36.9 C), temperature source Oral, resp. rate 18, weight 100.562 kg (221 lb 11.2 oz), SpO2 96 %. No results found. Results for orders placed or performed during the hospital encounter of 11/20/14 (from the past 72 hour(s))  Glucose, capillary     Status: Abnormal   Collection Time: 11/26/14 12:06 PM  Result Value Ref Range   Glucose-Capillary 132 (H) 70 - 99 mg/dL   Comment 1 Notify RN   Glucose, capillary     Status: Abnormal   Collection Time: 11/26/14  4:32 PM  Result Value Ref Range   Glucose-Capillary 177 (H) 70 - 99 mg/dL  Glucose, capillary     Status: Abnormal   Collection Time: 11/26/14  9:19 PM  Result Value Ref Range   Glucose-Capillary 160 (H) 70 - 99 mg/dL  Creatinine, serum     Status: Abnormal   Collection Time: 11/27/14  2:44 AM  Result Value Ref Range   Creatinine, Ser 0.63 0.50 - 1.35 mg/dL   GFR calc non Af Amer 87 (L) >90 mL/min   GFR calc Af Amer >90 >90 mL/min    Comment: (NOTE) The eGFR has been calculated using the CKD EPI equation. This calculation has not been validated in all clinical  situations. eGFR's persistently <90 mL/min signify possible Chronic Kidney Disease.   Glucose, capillary     Status: Abnormal   Collection Time: 11/27/14  3:26 AM  Result Value Ref Range   Glucose-Capillary 49 (L) 70 - 99 mg/dL  Glucose, capillary     Status: None   Collection Time: 11/27/14  4:04 AM  Result Value Ref Range   Glucose-Capillary 97 70 - 99 mg/dL  Glucose, capillary     Status: Abnormal   Collection Time: 11/27/14  6:47 AM  Result Value Ref Range   Glucose-Capillary 103 (H) 70 - 99 mg/dL  Glucose, capillary     Status: Abnormal   Collection Time: 11/27/14 11:31 AM  Result Value Ref Range   Glucose-Capillary 171 (H) 70 - 99 mg/dL  Glucose, capillary     Status: Abnormal   Collection Time: 11/27/14  4:31 PM  Result Value Ref Range   Glucose-Capillary 180 (H) 70 - 99 mg/dL  Glucose, capillary     Status: Abnormal   Collection Time: 11/27/14  9:06 PM  Result Value Ref Range   Glucose-Capillary 248 (H) 70 - 99 mg/dL  Glucose, capillary     Status: None   Collection Time: 11/28/14  6:57 AM  Result Value Ref Range   Glucose-Capillary 85 70 - 99 mg/dL  Glucose, capillary     Status: Abnormal   Collection Time: 11/28/14 12:04 PM  Result Value Ref Range   Glucose-Capillary 109 (H)  70 - 99 mg/dL   Comment 1 Notify RN   Basic metabolic panel     Status: Abnormal   Collection Time: 11/28/14 12:22 PM  Result Value Ref Range   Sodium 137 137 - 147 mEq/L   Potassium 4.6 3.7 - 5.3 mEq/L   Chloride 99 96 - 112 mEq/L   CO2 24 19 - 32 mEq/L   Glucose, Bld 126 (H) 70 - 99 mg/dL   BUN 17 6 - 23 mg/dL   Creatinine, Ser 0.58 0.50 - 1.35 mg/dL   Calcium 9.0 8.4 - 10.5 mg/dL   GFR calc non Af Amer 90 (L) >90 mL/min   GFR calc Af Amer >90 >90 mL/min    Comment: (NOTE) The eGFR has been calculated using the CKD EPI equation. This calculation has not been validated in all clinical situations. eGFR's persistently <90 mL/min signify possible Chronic Kidney Disease.    Anion gap  14 5 - 15  Glucose, capillary     Status: Abnormal   Collection Time: 11/28/14  4:55 PM  Result Value Ref Range   Glucose-Capillary 189 (H) 70 - 99 mg/dL  Glucose, capillary     Status: Abnormal   Collection Time: 11/28/14  9:11 PM  Result Value Ref Range   Glucose-Capillary 178 (H) 70 - 99 mg/dL  Glucose, capillary     Status: Abnormal   Collection Time: 11/29/14  2:44 AM  Result Value Ref Range   Glucose-Capillary 51 (L) 70 - 99 mg/dL  Glucose, capillary     Status: Abnormal   Collection Time: 11/29/14  3:19 AM  Result Value Ref Range   Glucose-Capillary 65 (L) 70 - 99 mg/dL  Glucose, capillary     Status: None   Collection Time: 11/29/14  3:59 AM  Result Value Ref Range   Glucose-Capillary 84 70 - 99 mg/dL  Glucose, capillary     Status: Abnormal   Collection Time: 11/29/14  5:04 AM  Result Value Ref Range   Glucose-Capillary 143 (H) 70 - 99 mg/dL  Glucose, capillary     Status: Abnormal   Collection Time: 11/29/14  6:55 AM  Result Value Ref Range   Glucose-Capillary 176 (H) 70 - 99 mg/dL     HEENT: normal Cardio: RRR and no murmurs Resp: CTA B/L and unlabored GI: BS positive and NT,ND Extremity:  Pulses positive and No Edema Skin:   Intact and Wound R forehead ecchymosis, faint, non tender Neuro: Alert/Oriented, Abnormal Sensory decreased LT sensation bilateral feet, Abnormal Motor 3- R delt, bi, tri, grip, HF, KE, ADF and Abnormal FMC Ataxic/ dec FMC, speech dysarthric, right facial droop. Impulsive, decreased insight and awareness Musc/Skel:  Other tightness bilateral hamstrings Gen NAD Able to propel WC  Assessment/Plan: 1. 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 Physiatrist is providing close team supervision and 24 hour management of active medical problems listed below. Physiatrist and rehab team continue to  assess barriers to discharge/monitor patient progress toward functional and medical goals.  FIM: FIM - Bathing Bathing Steps Patient Completed: Chest, Right Arm, Left Arm, Abdomen, Left upper leg, Right upper leg, Buttocks, Front perineal area, Left lower leg (including foot), Right lower leg (including foot) Bathing: 5: Supervision: Safety issues/verbal cues  FIM - Upper Body Dressing/Undressing Upper body dressing/undressing steps patient completed: Thread/unthread right sleeve of pullover shirt/dresss, Thread/unthread left sleeve of pullover shirt/dress, Put head through  opening of pull over shirt/dress, Pull shirt over trunk Upper body dressing/undressing: 5: Set-up assist to: Obtain clothing/put away FIM - Lower Body Dressing/Undressing Lower body dressing/undressing steps patient completed: Thread/unthread left pants leg, Pull pants up/down, Don/Doff left sock, Don/Doff left shoe, Fasten/unfasten left shoe, Thread/unthread right pants leg, Don/Doff right shoe, Don/Doff right sock, Fasten/unfasten right shoe Lower body dressing/undressing: 5: Supervision: Safety issues/verbal cues  FIM - Toileting Toileting steps completed by patient: Adjust clothing prior to toileting, Performs perineal hygiene, Adjust clothing after toileting Toileting Assistive Devices: Grab bar or rail for support Toileting: 5: Supervision: Safety issues/verbal cues  FIM - Radio producer Devices: Grab bars, Insurance account manager Transfers: 4-To toilet/BSC: Min A (steadying Pt. > 75%), 4-From toilet/BSC: Min A (steadying Pt. > 75%)  FIM - Bed/Chair Transfer Bed/Chair Transfer Assistive Devices: Walker, Arm rests Bed/Chair Transfer: 4: Chair or W/C > Bed: Min A (steadying Pt. > 75%), 4: Bed > Chair or W/C: Min A (steadying Pt. > 75%)  FIM - Locomotion: Wheelchair Distance: 150 Locomotion: Wheelchair: 5: Travels 150 ft or more: maneuvers on rugs and over door sills with supervision, cueing or  coaxing FIM - Locomotion: Ambulation Locomotion: Ambulation Assistive Devices: Administrator Ambulation/Gait Assistance: 4: Min assist Locomotion: Ambulation: 4: Travels 150 ft or more with minimal assistance (Pt.>75%)  Comprehension Comprehension Mode: Auditory Comprehension: 5-Follows basic conversation/direction: With extra time/assistive device  Expression Expression Mode: Verbal Expression: 5-Expresses basic 90% of the time/requires cueing < 10% of the time.  Social Interaction Social Interaction: 5-Interacts appropriately 90% of the time - Needs monitoring or encouragement for participation or interaction.  Problem Solving Problem Solving Mode: Not assessed Problem Solving: 4-Solves basic 75 - 89% of the time/requires cueing 10 - 24% of the time  Memory Memory: 3-Recognizes or recalls 50 - 74% of the time/requires cueing 25 - 49% of the time  Medical Problem List and Plan: 1. Functional deficits secondary to Left paracentral pontine stroke 2.  DVT Prophylaxis/Anticoagulation: Pharmaceutical: Lovenox 3. Pain Management: Tylenol prn for back pain. Will add K pad additionally.   4. Mood: LCSW to follow for evaluation and support.   5. Neuropsych: This patient is capable of making decisions on his own behalf.  -waist belt in wheelchair, up at nurses station prn for safety 6. Skin/Wound Care: Routine pressure relief measures. Rehab RN to monitor skin daily 7. Fluids/Electrolytes/Nutrition: Monitor I/O. Offer nutritional supplement prn if intake poor.IVF   8. DM type 2 with neuropathy: Will monitor BS with ac/hs checks. reduce lantus to 50 units and resumed metformin  -continue 850 metformin in am and decrease pm dose to 541m 9.  BPH: Continue proscar. Monitor for any voiding difficulty.    10.  HTN: Will monitor blood pressure tid. Currently controlled off HCTZ.  intake improving, BPs rising will restart low dose 11. GERD: will add Pepcid for symptoms.     LOS (Days)  9 A FACE TO FACE EVALUATION WAS PERFORMED  Buck Mcaffee E 11/29/2014, 8:34 AM

## 2014-11-30 ENCOUNTER — Inpatient Hospital Stay (HOSPITAL_COMMUNITY): Payer: Medicare Other | Admitting: *Deleted

## 2014-11-30 ENCOUNTER — Inpatient Hospital Stay (HOSPITAL_COMMUNITY): Payer: Medicare Other | Admitting: Speech Pathology

## 2014-11-30 ENCOUNTER — Inpatient Hospital Stay (HOSPITAL_COMMUNITY): Payer: Medicare Other | Admitting: Occupational Therapy

## 2014-11-30 DIAGNOSIS — E1140 Type 2 diabetes mellitus with diabetic neuropathy, unspecified: Secondary | ICD-10-CM

## 2014-11-30 LAB — GLUCOSE, CAPILLARY
GLUCOSE-CAPILLARY: 138 mg/dL — AB (ref 70–99)
Glucose-Capillary: 95 mg/dL (ref 70–99)
Glucose-Capillary: 76 mg/dL (ref 70–99)
Glucose-Capillary: 132 mg/dL — ABNORMAL HIGH (ref 70–99)
Glucose-Capillary: 174 mg/dL — ABNORMAL HIGH (ref 70–99)
Glucose-Capillary: 195 mg/dL — ABNORMAL HIGH (ref 70–99)

## 2014-11-30 MED ORDER — HEPARIN SODIUM (PORCINE) 1000 UNIT/ML IJ SOLN
INTRAMUSCULAR | Status: AC
  Filled 2014-11-30: qty 1

## 2014-11-30 MED ORDER — CHLORHEXIDINE GLUCONATE 4 % EX LIQD
CUTANEOUS | Status: AC
  Filled 2014-11-30: qty 15

## 2014-11-30 MED ORDER — LIDOCAINE HCL 1 % IJ SOLN
INTRAMUSCULAR | Status: AC
  Filled 2014-11-30: qty 20

## 2014-11-30 NOTE — Plan of Care (Signed)
Problem: RH Simple Meal Prep Goal: LTG Patient will perform simple meal prep w/assist (OT) LTG: Patient will perform simple meal prep with assistance, with/without cues (OT).  Meal Prep and housekeeping goals will not be addressed at this time as pt will be discharging to a SNF versus home.

## 2014-11-30 NOTE — Progress Notes (Signed)
Occupational Therapy Weekly Progress Note  Patient Details  Name: Chad Fuller MRN: [id number] Date of Birth: December 07, 1926  Beginning of progress report period: November 21, 2014 End of progress report period: November 30, 2014  Today's Date: 11/30/2014 OT Individual Time: 0800-0900 OT Individual Time Calculation (min): 60 min    Patient has met 4 of 4 short term goals.  Pt is progressing well with his safety awareness, postural control, use of R side and balance to a very close S level.  Patient continues to demonstrate the following deficits: decreased dynamic balance without UE support in reaching and decreased memory  and therefore will continue to benefit from skilled OT intervention to enhance overall performance with BADL and iADL.  Patient progressing toward long term goals..  Continue plan of care.  IADL goals discontinued as pt will be discharging to a SNF for continued therapy.  OT Short Term Goals Week 1:  OT Short Term Goal 1 (Week 1): Pt will ambulate to toilet with close S with RW. OT Short Term Goal 1 - Progress (Week 1): Met OT Short Term Goal 2 (Week 1): Pt will don his pants over his R foot. OT Short Term Goal 2 - Progress (Week 1): Met OT Short Term Goal 3 (Week 1): Pt will don sock and shoe on R foot.  OT Short Term Goal 3 - Progress (Week 1): Met OT Short Term Goal 4 (Week 1): Pt will stand at sink to groom with min A. OT Short Term Goal 4 - Progress (Week 1): Met Week 2:  OT Short Term Goal 1 (Week 2): STGs = LTGs   Skilled Therapeutic Interventions/Progress Updates:      Pt seen for BADL retraining of toileting, bathing, and dressing with a focus on dynamic balance and safety awareness.  Pt completed his breakfast with supervision.  He then needed to use restroom, but attempted to stand up without checking the breaks on his chair. Pt needed cues to fully lock the breaks. He then stood and ambulated to toilet with very close S and no physical A needed.  No further  cuing needed for safe foot placement. Pt completed all of his self care, including ambulating back to the chair in his room with close S.  Pt continues to have difficulty with day to day recall.  He was emotional about his discharge plans to a SNF, but did understand why he needed the 24 hr Supervision. Pt resting in w/c at end of session.    Therapy Documentation Precautions:  Precautions Precautions: Fall Precaution Comments: multiple falls at home; stated he falls up or down entry steps at home about once per month, due to "not knowing where his feet are" Restrictions Weight Bearing Restrictions: No    Vital Signs: Therapy Vitals Pulse Rate: 86 BP: (!) 118/59 mmHg Patient Position (if appropriate): Sitting Pain: Pain Assessment Pain Assessment: No/denies pain ADL: ADL ADL Comments: Refer to FIM  See FIM for current functional status  Therapy/Group: Individual Therapy  SAGUIER,JULIA 11/30/2014, 10:44 AM

## 2014-11-30 NOTE — Progress Notes (Signed)
80 year old male with history of DM type 2 with neuropathy, PE, NSCLC s/p resection, CAD who was admitted on 11/18/14 with slurred speech, dizziness and difficulty walking with fall. CT head without acute changes, generalized atrophy and right forehead contusion. CT cervical spine with stenosis C3/4 to C 5/6. MRI/MRA brain with Small acute brainstem infarct in the left paracentral pons and no significant stenosis.  2D echo with EF 60%, AV sclerosis and mitral annular calcification without stenosis. Carotid dopplers without significant stenosis. Neurology recommended ASA for thrombotic stroke due to SVD. Speech therapy evaluation with dysphagia and FEES done  Subjective/Complaints: Pt/staff concerned about an area on left first toe---irritated/red/ no pain Review of Systems - neg except for above Objective: Vital Signs: Blood pressure 104/63, pulse 96, temperature 97.8 F (36.6 C), temperature source Oral, resp. rate 18, weight 100.562 kg (221 lb 11.2 oz), SpO2 97 %. No results found. Results for orders placed or performed during the hospital encounter of 11/20/14 (from the past 72 hour(s))  Glucose, capillary     Status: Abnormal   Collection Time: 11/27/14 11:31 AM  Result Value Ref Range   Glucose-Capillary 171 (H) 70 - 99 mg/dL  Glucose, capillary     Status: Abnormal   Collection Time: 11/27/14  4:31 PM  Result Value Ref Range   Glucose-Capillary 180 (H) 70 - 99 mg/dL  Glucose, capillary     Status: Abnormal   Collection Time: 11/27/14  9:06 PM  Result Value Ref Range   Glucose-Capillary 248 (H) 70 - 99 mg/dL  Glucose, capillary     Status: None   Collection Time: 11/28/14  6:57 AM  Result Value Ref Range   Glucose-Capillary 85 70 - 99 mg/dL  Glucose, capillary     Status: Abnormal   Collection Time: 11/28/14 12:04 PM  Result Value Ref Range   Glucose-Capillary 109 (H) 70 - 99 mg/dL   Comment 1 Notify RN   Basic metabolic panel     Status: Abnormal   Collection Time: 11/28/14  12:22 PM  Result Value Ref Range   Sodium 137 137 - 147 mEq/L   Potassium 4.6 3.7 - 5.3 mEq/L   Chloride 99 96 - 112 mEq/L   CO2 24 19 - 32 mEq/L   Glucose, Bld 126 (H) 70 - 99 mg/dL   BUN 17 6 - 23 mg/dL   Creatinine, Ser 0.58 0.50 - 1.35 mg/dL   Calcium 9.0 8.4 - 10.5 mg/dL   GFR calc non Af Amer 90 (L) >90 mL/min   GFR calc Af Amer >90 >90 mL/min    Comment: (NOTE) The eGFR has been calculated using the CKD EPI equation. This calculation has not been validated in all clinical situations. eGFR's persistently <90 mL/min signify possible Chronic Kidney Disease.    Anion gap 14 5 - 15  Glucose, capillary     Status: Abnormal   Collection Time: 11/28/14  4:55 PM  Result Value Ref Range   Glucose-Capillary 189 (H) 70 - 99 mg/dL  Glucose, capillary     Status: Abnormal   Collection Time: 11/28/14  9:11 PM  Result Value Ref Range   Glucose-Capillary 178 (H) 70 - 99 mg/dL  Glucose, capillary     Status: Abnormal   Collection Time: 11/29/14  2:44 AM  Result Value Ref Range   Glucose-Capillary 51 (L) 70 - 99 mg/dL  Glucose, capillary     Status: Abnormal   Collection Time: 11/29/14  3:19 AM  Result Value Ref Range  Glucose-Capillary 65 (L) 70 - 99 mg/dL  Glucose, capillary     Status: None   Collection Time: 11/29/14  3:59 AM  Result Value Ref Range   Glucose-Capillary 84 70 - 99 mg/dL  Glucose, capillary     Status: Abnormal   Collection Time: 11/29/14  5:04 AM  Result Value Ref Range   Glucose-Capillary 143 (H) 70 - 99 mg/dL  Glucose, capillary     Status: Abnormal   Collection Time: 11/29/14  6:55 AM  Result Value Ref Range   Glucose-Capillary 176 (H) 70 - 99 mg/dL  Glucose, capillary     Status: Abnormal   Collection Time: 11/29/14 11:31 AM  Result Value Ref Range   Glucose-Capillary 178 (H) 70 - 99 mg/dL   Comment 1 Notify RN   Glucose, capillary     Status: Abnormal   Collection Time: 11/29/14  4:42 PM  Result Value Ref Range   Glucose-Capillary 211 (H) 70 - 99  mg/dL  Glucose, capillary     Status: Abnormal   Collection Time: 11/29/14  9:25 PM  Result Value Ref Range   Glucose-Capillary 232 (H) 70 - 99 mg/dL  Glucose, capillary     Status: None   Collection Time: 11/30/14  3:41 AM  Result Value Ref Range   Glucose-Capillary 95 70 - 99 mg/dL  Glucose, capillary     Status: None   Collection Time: 11/30/14  5:25 AM  Result Value Ref Range   Glucose-Capillary 76 70 - 99 mg/dL  Glucose, capillary     Status: Abnormal   Collection Time: 11/30/14  6:49 AM  Result Value Ref Range   Glucose-Capillary 138 (H) 70 - 99 mg/dL     HEENT: normal Cardio: RRR and no murmurs Resp: CTA B/L and unlabored GI: BS positive and NT,ND Extremity:  Pulses positive and No Edema Skin:   Intact and Wound R forehead ecchymosis, faint, non tender. Right first toe with fungus on nail but no drainage or breakdown. Really no erythema. Non-tender. Neuro: Alert/Oriented, Abnormal Sensory decreased LT sensation bilateral feet, Abnormal Motor 3- R delt, bi, tri, grip, HF, KE, ADF and Abnormal FMC Ataxic/ dec FMC, speech dysarthric, right facial droop. Impulsive, decreased insight and awareness Musc/Skel:  Other tightness bilateral hamstrings Gen NAD Able to propel WC  Assessment/Plan: 1. 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 Physiatrist is providing close team supervision and 24 hour management of active medical problems listed below. Physiatrist and rehab team continue to assess barriers to discharge/monitor patient progress toward functional and medical goals.  FIM: FIM - Bathing Bathing Steps Patient Completed: Chest, Right Arm, Left Arm, Abdomen, Left upper leg, Right upper leg, Buttocks, Front perineal area, Left lower leg (including foot), Right lower leg (including foot) Bathing: 5: Supervision: Safety  issues/verbal cues  FIM - Upper Body Dressing/Undressing Upper body dressing/undressing steps patient completed: Thread/unthread right sleeve of pullover shirt/dresss, Thread/unthread left sleeve of pullover shirt/dress, Put head through opening of pull over shirt/dress, Pull shirt over trunk Upper body dressing/undressing: 5: Set-up assist to: Obtain clothing/put away FIM - Lower Body Dressing/Undressing Lower body dressing/undressing steps patient completed: Thread/unthread left pants leg, Pull pants up/down, Don/Doff left sock, Don/Doff left shoe, Fasten/unfasten left shoe, Thread/unthread right pants leg, Don/Doff right shoe, Don/Doff right sock, Fasten/unfasten right shoe Lower body dressing/undressing: 5: Supervision: Safety issues/verbal cues  FIM - Toileting Toileting steps completed  by patient: Adjust clothing prior to toileting, Performs perineal hygiene, Adjust clothing after toileting Toileting Assistive Devices: Grab bar or rail for support Toileting: 5: Supervision: Safety issues/verbal cues  FIM - Radio producer Devices: Grab bars, Insurance account manager Transfers: 5-From toilet/BSC: Supervision (verbal cues/safety issues), 5-To toilet/BSC: Supervision (verbal cues/safety issues)  FIM - Control and instrumentation engineer Devices: Arm rests, Copy: 4: Bed > Chair or W/C: Min A (steadying Pt. > 75%), 4: Chair or W/C > Bed: Min A (steadying Pt. > 75%)  FIM - Locomotion: Wheelchair Distance: 150 Locomotion: Wheelchair: 4: Travels 150 ft or more: maneuvers on rugs and over door sillls with minimal assistance (Pt.>75%) FIM - Locomotion: Ambulation Locomotion: Ambulation Assistive Devices: Administrator Ambulation/Gait Assistance: 3: Mod assist, 4: Min assist Locomotion: Ambulation: 2: Travels 50 - 149 ft with moderate assistance (Pt: 50 - 74%)  Comprehension Comprehension Mode: Auditory Comprehension: 5-Follows basic  conversation/direction: With extra time/assistive device  Expression Expression Mode: Verbal Expression: 5-Expresses basic needs/ideas: With extra time/assistive device  Social Interaction Social Interaction: 5-Interacts appropriately 90% of the time - Needs monitoring or encouragement for participation or interaction.  Problem Solving Problem Solving Mode: Not assessed Problem Solving: 4-Solves basic 75 - 89% of the time/requires cueing 10 - 24% of the time  Memory Memory: 4-Recognizes or recalls 75 - 89% of the time/requires cueing 10 - 24% of the time  Medical Problem List and Plan: 1. Functional deficits secondary to Left paracentral pontine stroke 2.  DVT Prophylaxis/Anticoagulation: Pharmaceutical: Lovenox 3. Pain Management: Tylenol prn for back pain. Will add K pad additionally.   4. Mood: LCSW to follow for evaluation and support.   5. Neuropsych: This patient is capable of making decisions on his own behalf.  -waist belt in wheelchair, up at nurses station prn for safety--continue 6. Skin/Wound Care: Routine pressure relief measures.   -normal hygiene to left great toe is all that's required 7. Fluids/Electrolytes/Nutrition: Monitor I/O. Offer nutritional supplement prn if intake poor.IVF   8. DM type 2 with neuropathy: Will monitor BS with ac/hs checks. reduce lantus to 50 units and resumed metformin  -continue 850 metformin in am and decreased pm dose to 562m--improved 9.  BPH: Continue proscar. Monitor for any voiding difficulty.    10.  HTN: Will monitor blood pressure tid. Currently controlled off HCTZ.  intake improving, BPs rising will restart low dose 11. GERD:  added Pepcid for symptoms.     LOS (Days) 10 A FACE TO FACE EVALUATION WAS PERFORMED  Rosalyn Archambault T 11/30/2014, 9:04 AM

## 2014-11-30 NOTE — Progress Notes (Signed)
Social Work Patient ID: Chad Fuller, male   DOB: Nov 11, 1926, 80 y.o.   MRN: [id number] Met with pt and his son was also visiting and he has decided to let worker pursue NHP and would like to go to [facility] in [place], which is closer to all of his family. Will begin bed search and expand if no beds at [facility].

## 2014-11-30 NOTE — Progress Notes (Signed)
Speech Language Pathology Daily Session Note  Patient Details  Name: Chad Fuller MRN: [id number] Date of Birth: 1926-09-30  Today's Date: 11/30/2014 SLP Individual Time: 1030-1100 SLP Individual Time Calculation (min): 30 min  Short Term Goals: Week 2: SLP Short Term Goal 1 (Week 2): Pt will tolerate presentations of upgraded consistencies with no overt s/s of aspiration with supervision cues for use of swallowing precautions.  SLP Short Term Goal 2 (Week 2): Pt will improve functional problem solving for basic tasks for 75% accuracy with min assist.  SLP Short Term Goal 3 (Week 2): Pt will improve use of compensatory strategies to facilitate recall of new information for 75% accuracy with min assist  SLP Short Term Goal 4 (Week 2): Pt will improve speech intelligibility at the conversational level to >90% with supervision cues for use of dysarthria strategies.  SLP Short Term Goal 5 (Week 2): Pt will complete pharyngeal and oral motor strengthening exercises to improve right sided weakness and overall swallowing function with min cues   Skilled Therapeutic Interventions:  Pt was seen for skilled ST targeting dysphagia goals.  Upon arrival, pt was seated upright in wheelchair, awake, alert, and pleasantly interactive.  SLP facilitated the session with trials of regular water via teaspoons following oral care per the water protocol.  Pt benefited from initial instructional cues for use of teaspoon when consuming trials of water but was otherwise supervision level assist for use of swallowing precautions.  Pt exhibited reflexive cough x1 with reports that "that went down the wrong pipe" following multiple consecutive teaspoon trials.  Pt is making good progress towards meeting goals.  Continue per current plan of care.   FIM:  Comprehension Comprehension Mode: Auditory Comprehension: 5-Follows basic conversation/direction: With extra time/assistive device Expression Expression Mode:  Verbal Expression: 5-Expresses basic needs/ideas: With extra time/assistive device Social Interaction Social Interaction: 5-Interacts appropriately 90% of the time - Needs monitoring or encouragement for participation or interaction. Problem Solving Problem Solving: 4-Solves basic 75 - 89% of the time/requires cueing 10 - 24% of the time Memory Memory: 4-Recognizes or recalls 75 - 89% of the time/requires cueing 10 - 24% of the time FIM - Eating Eating Activity: 5: Supervision/cues  Pain Pain Assessment Pain Assessment: No/denies pain  Therapy/Group: Individual Therapy  Andrik Sandt, Selinda Orion 11/30/2014, 10:41 AM

## 2014-11-30 NOTE — Plan of Care (Signed)
Problem: RH Light Housekeeping Goal: LTG Patient will perform light housekeeping w/assist (OT) LTG: Patient will perform light housekeeping with assistance, with/without cues (OT).   Meal Prep and housekeeping goals will not be addressed at this time as pt will be discharging to a SNF versus home.

## 2014-11-30 NOTE — Discharge Instructions (Signed)
Inpatient Rehab Discharge Instructions  Chad Fuller Discharge date and time:    Activities/Precautions/ Functional Status: Activity: activity as tolerated Diet: diabetic diet Wound Care: none needed   Functional status:  ___ No restrictions     ___ Walk up steps independently ___ 24/7 supervision/assistance   ___ Walk up steps with assistance ___ Intermittent supervision/assistance  ___ Bathe/dress independently ___ Walk with walker     ___ Bathe/dress with assistance ___ Walk Independently    ___ Shower independently ___ Walk with assistance    ___ Shower with assistance ___ No alcohol     ___ Return to work/school ________  Special Instructions:    STROKE/TIA DISCHARGE INSTRUCTIONS SMOKING Cigarette smoking nearly doubles your risk of having a stroke & is the single most alterable risk factor  If you smoke or have smoked in the last 12 months, you are advised to quit smoking for your health.  Most of the excess cardiovascular risk related to smoking disappears within a year of stopping.  Ask you doctor about anti-smoking medications  [place] Quit Line: 1-800-QUIT NOW  Free Smoking Cessation Classes [phone]  CHOLESTEROL Know your levels; limit fat & cholesterol in your diet  Lipid Panel     Component Value Date/Time   CHOL 120 11/19/2014 0710   TRIG 72 11/19/2014 0710   HDL 36* 11/19/2014 0710   CHOLHDL 3.3 11/19/2014 0710   VLDL 14 11/19/2014 0710   LDLCALC 70 11/19/2014 0710      Many patients benefit from treatment even if their cholesterol is at goal.  Goal: Total Cholesterol (CHOL) less than 160  Goal:  Triglycerides (TRIG) less than 150  Goal:  HDL greater than 40  Goal:  LDL (LDLCALC) less than 100   BLOOD PRESSURE American Stroke Association blood pressure target is less that 120/80 mm/Hg  Your discharge blood pressure is:  BP: (!) 118/59 mmHg  Monitor your blood pressure  Limit your salt and alcohol intake  Many individuals will require  more than one medication for high blood pressure  DIABETES (A1c is a blood sugar average for last 3 months) Goal HGBA1c is under 7% (HBGA1c is blood sugar average for last 3 months)  Diabetes:     Lab Results  Component Value Date   HGBA1C 7.9* 11/19/2014     Your HGBA1c can be lowered with medications, healthy diet, and exercise.  Check your blood sugar as directed by your physician  Call your physician if you experience unexplained or low blood sugars.  PHYSICAL ACTIVITY/REHABILITATION Goal is 30 minutes at least 4 days per week  Activity: No driving, Therapies: See above Return to work: N/A  Activity decreases your risk of heart attack and stroke and makes your heart stronger.  It helps control your weight and blood pressure; helps you relax and can improve your mood.  Participate in a regular exercise program.  Talk with your doctor about the best form of exercise for you (dancing, walking, swimming, cycling).  DIET/WEIGHT Goal is to maintain a healthy weight  Your discharge diet is: DIET DYS 3  liquids Your height is:  Height: 6\' 2"  (188 cm) Your current weight is: Weight: 100.562 kg (221 lb 11.2 oz) Your Body Mass Index (BMI) is:  BMI (Calculated): 28.5  Following the type of diet specifically designed for you will help prevent another stroke.  Your goal weight is:  194 lbs  Your goal Body Mass Index (BMI) is 19-24.  Healthy food habits can help reduce 3  risk factors for stroke:  High cholesterol, hypertension, and excess weight.  RESOURCES Stroke/Support Group:  Call [phone]   STROKE EDUCATION PROVIDED/REVIEWED AND GIVEN TO PATIENT Stroke warning signs and symptoms How to activate emergency medical system (call 911). Medications prescribed at discharge. Need for follow-up after discharge. Personal risk factors for stroke. Pneumonia vaccine given:  Flu vaccine given:  My questions have been answered, the writing is legible, and I understand these instructions.   I will adhere to these goals & educational materials that have been provided to me after my discharge from the hospital.      My questions have been answered and I understand these instructions. I will adhere to these goals and the provided educational materials after my discharge from the hospital.  Patient/Caregiver Signature _______________________________ Date __________  Clinician Signature _______________________________________ Date __________  Please bring this form and your medication list with you to all your follow-up doctor's appointments.

## 2014-11-30 NOTE — Progress Notes (Signed)
Physical Therapy Session Note  Patient Details  Name: Chad Fuller MRN: [id number] Date of Birth: [birth year]  Today's Date: 11/30/2014 PT Individual Time: 0905-1005 and 14:15-14:45 (27mn)  PT Individual Time Calculation (min): 60 min   Short Term Goals: Week 1:  PT Short Term Goal 1 (Week 1): pt will perform w/c>< bed transfer with min assist 50% of time PT Short Term Goal 1 - Progress (Week 1): Met PT Short Term Goal 2 (Week 1): pt will propel w/c x 150' with supervision/min assist PT Short Term Goal 2 - Progress (Week 1): Met PT Short Term Goal 3 (Week 1): pt will perform sit>< stand with min assist PT Short Term Goal 3 - Progress (Week 1): Met PT Short Term Goal 4 (Week 1): pt will perform gait with LRAD x 50' with mod assist PT Short Term Goal 4 - Progress (Week 1): Met PT Short Term Goal 5 (Week 1): pt will ascend/descend 5 steps with min assist without impulsivity PT Short Term Goal 5 - Progress (Week 1): Progressing toward goal Week 2:  PT Short Term Goal 1 (Week 2): Pt will perform basic transfers with S 3/5 trials PT Short Term Goal 2 (Week 2): Pt will perform gait with RW x150 and min A PT Short Term Goal 3 (Week 2): Pt will ascend/descend 5 stairs with S PT Short Term Goal 4 (Week 2): Pt will improve Berg Balance test score by>6 points to demonstrate fall risk reduction    Skilled Therapeutic Interventions/Progress Updates:  First x Pt engaged in gait training with RW, stairs, functional mobility training in carpeted setting, and NMR via forced use, manual facilitation, and multi-modal cues. Pt discussing possible DC to SNF, which therapist encouraged with hope of more recovery.   Pt performed multiple sit<>stand and stand-step transfers with S for basic moves, but up to MMcBridefor more challenging transitional movements, especially turns and backing-up to seat. Pt has more control of descent, but decreased stance width and righting reactions when looses balance.   Gait  training in controlled and carpeted settings 3x150' with RW and close S in basic conditions, but min-guard when turning in busy and tight environments. Increased challenge with obstacles course including tight turns and step-overs. Pt tasked to correct his gait quality by teach back, specifically for posture and R foot clearance, which he is unable to feel or see.   Stair training x10 with min A for steadying, bil rails in reciprocal pattern. Pt continues to demonstrate some impulsivity, espcially during turns and with full foot placement on step.   NMR performed via Nustep with bil LEs and RUE only x6 min for increased full reciprocal motions, level 5 x 45spm. Additionally, instructed pt in OGuthriefor fall risk reduction and RLE motor control. Pt needed 3 rest breaks during this task. Performed side stepping with bil UE forearm support and Mod A x20' each direction with cues for foot clearance. NMR performed throughout for postural control and balance.   Pt continues to make good progress with functional mobility and gait, but carry-over of learning seems limited by cognition. [facility].   Second tx focused on gait training in controlled and carpeted setting as well as orthotic fit and training. CGerald Stabspresent; brought XL anterior [place] Walk On AFO to assist in RLE control during gait. Gait marked by genu-varus as well as R pronation and narrow BOS overall, especially at heels. Gait significantly improved with addition of  brace, reducing risk of falls, however pt seems to continue dragging R foot along at times, especially in busy settings. Pt given continuous cues for posture and RW management. Gait training 43x150' with close S and RW. Increased dynamic balance challenge with horseshoe toss, having pt walk to retrieve them from floor, needing Min A for this challenge as well as safety cues. Pt left up in [facility] with lap belt.      Therapy Documentation Precautions:   Precautions Precautions: Fall Precaution Comments: multiple falls at home; stated he falls up or down entry steps at home about once per month, due to "not knowing where his feet are" Restrictions Weight Bearing Restrictions: No General:   Vital Signs: Therapy Vitals Pulse Rate: 86 BP: (!) 118/59 mmHg Patient Position (if appropriate): Sitting Pain: Pain Assessment Pain Assessment: No/denies pain Locomotion : Ambulation Ambulation/Gait Assistance: 5: Supervision;4: Min guard   See FIM for current functional status  Therapy/Group: Individual Therapy  Kennieth Rad, PT, DPT   11/30/2014, 10:25 AM

## 2014-12-01 ENCOUNTER — Inpatient Hospital Stay (HOSPITAL_COMMUNITY): Payer: Medicare Other | Admitting: *Deleted

## 2014-12-01 ENCOUNTER — Inpatient Hospital Stay (HOSPITAL_COMMUNITY): Payer: Medicare Other

## 2014-12-01 LAB — GLUCOSE, CAPILLARY
GLUCOSE-CAPILLARY: 177 mg/dL — AB (ref 70–99)
Glucose-Capillary: 100 mg/dL — ABNORMAL HIGH (ref 70–99)
Glucose-Capillary: 105 mg/dL — ABNORMAL HIGH (ref 70–99)
Glucose-Capillary: 95 mg/dL (ref 70–99)
Glucose-Capillary: 224 mg/dL — ABNORMAL HIGH (ref 70–99)
Glucose-Capillary: 219 mg/dL — ABNORMAL HIGH (ref 70–99)

## 2014-12-01 NOTE — Progress Notes (Signed)
Physical Therapy Session Note  Patient Details  Name: Chad Fuller MRN: [id number] Date of Birth: [birth year]  Today's Date: 12/01/2014 PT Individual Time: 0930-1015 PT Individual Time Calculation (min): 45 min   Short Term Goals: Week 2:  PT Short Term Goal 1 (Week 2): Pt will perform basic transfers with S 3/5 trials PT Short Term Goal 2 (Week 2): Pt will perform gait with RW x150 and min A PT Short Term Goal 3 (Week 2): Pt will ascend/descend 5 stairs with S PT Short Term Goal 4 (Week 2): Pt will improve Berg Balance test score by>6 points to demonstrate fall risk reduction PT Short Term Goal 5 (Week 2): Pt will initiate seated HS stretch with handout at start of each PT tx with S for safety.   Skilled Therapeutic Interventions/Progress Updates:  R shoe/R AFO donned by PT; pt was able to wrap AFO straps around and secure them. He wears L sandal due to toe wound.  neuromuscular re-education via demo, forced use, VCs for:  -RUE coordinated use for bil UE w/c propulsion x 150' with distant supervision -bil heel cords and hamstring stretch standing on wedge with ue support, x 30 seconds x 3 -gait x 150' x 2 with RAFO, RW with min guard assist, mod cues for wider BOS, upright trunk, forward gaze; up/down 5 steps 2 rails, step though pattern self selected by pt, with some difficulty clearing R foot and controlling descent, but no LOB    Pt left sitting up in w/c in room with quick release belt applied and all needs within reach.  Pt would benefit from a toe cap on R shoe to facilitate foot clearance, especially on stairs. Therapy Documentation Precautions:  Precautions Precautions: Fall Precaution Comments: multiple falls at home; stated he falls up or down entry steps at home about once per month, due to "not knowing where his feet are" Restrictions Weight Bearing Restrictions: No   Pain:none noted     See FIM for current functional status  Therapy/Group: Individual  Therapy  Zamia Tyminski 12/01/2014, 5:28 PM

## 2014-12-01 NOTE — Progress Notes (Signed)
Occupational Therapy Session Note  Patient Details  Name: Chad Fuller MRN: [id number] Date of Birth: 12-12-1926  Today's Date: 12/01/2014 OT Individual Time:  -  1330-1400  (30 min)      Short Term Goals: Week 1:  OT Short Term Goal 1 (Week 1): Pt will ambulate to toilet with close S with RW. OT Short Term Goal 1 - Progress (Week 1): Met OT Short Term Goal 2 (Week 1): Pt will don his pants over his R foot. OT Short Term Goal 2 - Progress (Week 1): Met OT Short Term Goal 3 (Week 1): Pt will don sock and shoe on R foot.  OT Short Term Goal 3 - Progress (Week 1): Met OT Short Term Goal 4 (Week 1): Pt will stand at sink to groom with min A. OT Short Term Goal 4 - Progress (Week 1): Met Week 2:  OT Short Term Goal 1 (Week 2): STGs = LTGs  Week 3:     Skilled Therapeutic Interventions/Progress Updates:    Practiced functional mobility with emphasis on balance and heel toe stride.  Pt. Ambulated to day room with minimal tripping  noted.  Pt. verblized understanding of deficits and ways he needed to compensate.  Left pt in wc with safety belt on and call bell,phone within reach.     Therapy Documentation Precautions:  Precautions Precautions: Fall Precaution Comments: multiple falls at home; stated he falls up or down entry steps at home about once per month, due to "not knowing where his feet are" Restrictions Weight Bearing Restrictions: No      Pain: Pain Assessment Pain Assessment: No/denies pain ADL: ADL ADL Comments: Refer to FIM        See FIM for current functional status  Therapy/Group: Individual Therapy  Lisa Roca 12/01/2014, 1:31 PM

## 2014-12-01 NOTE — Progress Notes (Signed)
VIYAAN CHAMPINE is a 80 y.o. male November 13, 1926 [id number]  Subjective: No new complaints. No new problems. Slept well. Feeling OK. Very pleasant, reminiscent of wife who passed 8 years ago and childhood holiday memories  Objective: Vital signs in last 24 hours: Temp:  [98.1 F (36.7 C)-98.7 F (37.1 C)] 98.1 F (36.7 C) (12/05 0524) Pulse Rate:  [66-98] 89 (12/05 0524) Resp:  [20] 20 (12/05 0524) BP: (110-153)/(62-93) 153/84 mmHg (12/05 0524) SpO2:  [96 %-100 %] 100 % (12/05 0524) Weight change:  Last BM Date: 11/29/14  Intake/Output from previous day: 12/04 0701 - 12/05 0700 In: 960 [P.O.:960] Out: -   Physical Exam General: No apparent distress   In WC at [facility] Lungs: Normal effort. Lungs clear to auscultation, no crackles or wheezes. Cardiovascular: Regular rate and rhythm, no edema Neurological: No new neurological deficits Wounds: N/A      Lab Results: BMET    Component Value Date/Time   NA 137 11/28/2014 1222   NA 142 02/21/2014 0827   K 4.6 11/28/2014 1222   K 4.3 02/21/2014 0827   CL 99 11/28/2014 1222   CL 101 02/22/2013 1415   CO2 24 11/28/2014 1222   CO2 31* 02/21/2014 0827   GLUCOSE 126* 11/28/2014 1222   GLUCOSE 159* 02/21/2014 0827   GLUCOSE 254* 02/22/2013 1415   BUN 17 11/28/2014 1222   BUN 13.8 02/21/2014 0827   CREATININE 0.58 11/28/2014 1222   CREATININE 0.8 02/21/2014 0827   CALCIUM 9.0 11/28/2014 1222   CALCIUM 9.5 02/21/2014 0827   GFRNONAA 90* 11/28/2014 1222   GFRAA >90 11/28/2014 1222   CBC    Component Value Date/Time   WBC 7.4 11/21/2014 0630   WBC 7.8 02/21/2014 0827   RBC 4.49 11/21/2014 0630   RBC 4.77 02/21/2014 0827   HGB 14.0 11/21/2014 0630   HGB 15.1 02/21/2014 0827   HCT 41.1 11/21/2014 0630   HCT 44.1 02/21/2014 0827   PLT 226 11/21/2014 0630   PLT 280 02/21/2014 0827   MCV 91.5 11/21/2014 0630   MCV 92.5 02/21/2014 0827   MCH 31.2 11/21/2014 0630   MCH 31.6 02/21/2014 0827   MCHC 34.1 11/21/2014 0630   MCHC 34.2  02/21/2014 0827   RDW 12.8 11/21/2014 0630   RDW 13.4 02/21/2014 0827   LYMPHSABS 1.8 11/21/2014 0630   LYMPHSABS 1.9 02/21/2014 0827   MONOABS 0.6 11/21/2014 0630   MONOABS 0.5 02/21/2014 0827   EOSABS 0.2 11/21/2014 0630   EOSABS 0.2 02/21/2014 0827   BASOSABS 0.0 11/21/2014 0630   BASOSABS 0.0 02/21/2014 0827   CBG's (last 3):   Recent Labs  12/01/14 0348 12/01/14 0602 12/01/14 0655  GLUCAP 100* 105* 95   LFT's Lab Results  Component Value Date   ALT 13 11/21/2014   AST 17 11/21/2014   ALKPHOS 101 11/21/2014   BILITOT 0.7 11/21/2014    Studies/Results: No results found.  Medications:  I have reviewed the patient's current medications. Scheduled Medications: . aspirin EC  325 mg Oral Daily  . bacitracin   Topical Daily  . enoxaparin (LOVENOX) injection  40 mg Subcutaneous Q24H  . ezetimibe  10 mg Oral Daily  . famotidine  10 mg Oral BID  . finasteride  5 mg Oral Daily  . hydrochlorothiazide  6.25 mg Oral Daily  . insulin aspart  0-9 Units Subcutaneous TID WC  . insulin glargine  50 Units Subcutaneous Daily  . metFORMIN  500 mg Oral Q supper  . metFORMIN  850 mg Oral Q breakfast  . sodium chloride 0.9 % 1,000 mL infusion   Intravenous Daily   PRN Medications: acetaminophen, alum & mag hydroxide-simeth, bisacodyl, food thickener, guaiFENesin-dextromethorphan, ondansetron **OR** ondansetron (ZOFRAN) IV, senna-docusate, traZODone  Assessment/Plan: Principal Problem:   Left pontine CVA Active Problems:   Type 2 diabetes mellitus   Right hemiparesis   Dysphagia, post-stroke  1. Functional deficits secondary to Left paracentral pontine stroke 2. DVT Prophylaxis/Anticoagulation: Pharmaceutical: Lovenox 3. Pain Management: Tylenol prn for back pain and K pad additionally.  4. Mood: LCSW to follow for evaluation and support.  5. Neuropsych: This patient is capable of making decisions on his own behalf. -waist belt in wheelchair, up at nurses  station prn for safety--continue 6. Skin/Wound Care: Routine pressure relief measures. normal hygiene to left great toe 7. Fluids/Electrolytes/Nutrition: Monitor I/O. Offer nutritional supplement prn if intake poor.IVF  8. DM type 2 with neuropathy: Will monitor BS with ac/hs checks. on lantus 50 units with resumed metformin -continue 850 metformin in am and decreased pm dose to 500mg --improved 9. BPH: Continue proscar. Monitor for any voiding difficulty.  10. HTN:  monitor blood pressure tid. On HCTZ PTA, but holding at this time. If BPs rising, will restart low dose 11. GERD:continue Pepcid   Length of stay, days: 11  Valerie A. Asa Lente, MD 12/01/2014, 10:38 AM

## 2014-12-01 NOTE — Plan of Care (Signed)
Problem: RH BOWEL ELIMINATION Goal: RH STG MANAGE BOWEL WITH ASSISTANCE STG Manage Bowel with Assistance. Mod I  Outcome: Progressing Goal: RH STG MANAGE BOWEL W/MEDICATION W/ASSISTANCE STG Manage Bowel with Medication with min Assistance.  Outcome: Progressing  Problem: RH BLADDER ELIMINATION Goal: RH STG MANAGE BLADDER WITH ASSISTANCE STG Manage Bladder With Assistance. Mod I  Outcome: Progressing  Problem: RH SKIN INTEGRITY Goal: RH STG SKIN FREE OF INFECTION/BREAKDOWN Skin to remain free from infection/breakdown while on rehab with min assist  Outcome: Progressing  Problem: RH SAFETY Goal: RH STG ADHERE TO SAFETY PRECAUTIONS W/ASSISTANCE/DEVICE STG Adhere to Safety Precautions With Assistance/Device. Supervision  Outcome: Progressing  Problem: RH PAIN MANAGEMENT Goal: RH STG PAIN MANAGED AT OR BELOW PT'S PAIN GOAL <4  Outcome: Progressing

## 2014-12-02 ENCOUNTER — Inpatient Hospital Stay (HOSPITAL_COMMUNITY): Payer: Medicare Other | Admitting: *Deleted

## 2014-12-02 LAB — GLUCOSE, CAPILLARY
GLUCOSE-CAPILLARY: 111 mg/dL — AB (ref 70–99)
GLUCOSE-CAPILLARY: 121 mg/dL — AB (ref 70–99)
GLUCOSE-CAPILLARY: 259 mg/dL — AB (ref 70–99)
Glucose-Capillary: 94 mg/dL (ref 70–99)
Glucose-Capillary: 213 mg/dL — ABNORMAL HIGH (ref 70–99)
Glucose-Capillary: 173 mg/dL — ABNORMAL HIGH (ref 70–99)

## 2014-12-02 NOTE — Progress Notes (Signed)
Occupational Therapy Session Note  Patient Details  Name: Chad Fuller MRN: [id number] Date of Birth: Oct 18, 1926  Today's Date: 12/02/2014 OT Individual Time:  -   8315-1761 (42 min)      Short Term Goals: Week 1:  OT Short Term Goal 1 (Week 1): Pt will ambulate to toilet with close S with RW. OT Short Term Goal 1 - Progress (Week 1): Met OT Short Term Goal 2 (Week 1): Pt will don his pants over his R foot. OT Short Term Goal 2 - Progress (Week 1): Met OT Short Term Goal 3 (Week 1): Pt will don sock and shoe on R foot.  OT Short Term Goal 3 - Progress (Week 1): Met OT Short Term Goal 4 (Week 1): Pt will stand at sink to groom with min A. OT Short Term Goal 4 - Progress (Week 1): Met Week 2:  OT Short Term Goal 1 (Week 2): STGs = LTGs   Skilled Therapeutic Interventions/Progress Updates:    Practiced functional mobility with emphasis on balance and heel toe stride.  Pt. Ambulated to day room with minimal tripping  noted.  Utilized watering plants as means to address mobility, balance and using RUE. Pt. Able to verbalize the points to remember when walking with standing tall, stay in walker, look straight ahead, and walk with heel toe stride.  He needed minimal cues to utilize heel toe strategy when crossing thresholds.    Left pt in wc with safety belt on and call bell,phone within reach.    Therapy Documentation Precautions:  Precautions Precautions: Fall Precaution Comments: multiple falls at home; stated he falls up or down entry steps at home about once per month, due to "not knowing where his feet are" Restrictions Weight Bearing Restrictions: No  Pain:  none   ADL: ADL ADL Comments: Refer to FIM       See FIM for current functional status  Therapy/Group: Individual Therapy  Lisa Roca 12/02/2014, 4:51 PM

## 2014-12-02 NOTE — Progress Notes (Signed)
Chad Fuller is a 80 y.o. male [birth year] [id number]  Subjective: No new complaints. No new problems. Slept well. Feeling well.   Objective: Vital signs in last 24 hours: Temp:  [98 F (36.7 C)-98.4 F (36.9 C)] 98 F (36.7 C) (12/06 0600) Pulse Rate:  [87] 87 (12/06 0600) Resp:  [18] 18 (12/06 0600) BP: (134-144)/(66-81) 144/81 mmHg (12/06 0600) SpO2:  [94 %-96 %] 94 % (12/06 0600) Weight change:  Last BM Date: 12/01/14  Intake/Output from previous day: 12/05 0701 - 12/06 0700 In: 360 [P.O.:360] Out: 425 [Urine:425]  Physical Exam General: No apparent distress   In WC at [facility] Lungs: Normal effort. Lungs clear to auscultation, no crackles or wheezes. Cardiovascular: Regular rate and rhythm, no edema Neurological: No new neurological deficits Wounds: N/A      Lab Results: BMET    Component Value Date/Time   NA 137 11/28/2014 1222   NA 142 02/21/2014 0827   K 4.6 11/28/2014 1222   K 4.3 02/21/2014 0827   CL 99 11/28/2014 1222   CL 101 02/22/2013 1415   CO2 24 11/28/2014 1222   CO2 31* 02/21/2014 0827   GLUCOSE 126* 11/28/2014 1222   GLUCOSE 159* 02/21/2014 0827   GLUCOSE 254* 02/22/2013 1415   BUN 17 11/28/2014 1222   BUN 13.8 02/21/2014 0827   CREATININE 0.58 11/28/2014 1222   CREATININE 0.8 02/21/2014 0827   CALCIUM 9.0 11/28/2014 1222   CALCIUM 9.5 02/21/2014 0827   GFRNONAA 90* 11/28/2014 1222   GFRAA >90 11/28/2014 1222   CBC    Component Value Date/Time   WBC 7.4 11/21/2014 0630   WBC 7.8 02/21/2014 0827   RBC 4.49 11/21/2014 0630   RBC 4.77 02/21/2014 0827   HGB 14.0 11/21/2014 0630   HGB 15.1 02/21/2014 0827   HCT 41.1 11/21/2014 0630   HCT 44.1 02/21/2014 0827   PLT 226 11/21/2014 0630   PLT 280 02/21/2014 0827   MCV 91.5 11/21/2014 0630   MCV 92.5 02/21/2014 0827   MCH 31.2 11/21/2014 0630   MCH 31.6 02/21/2014 0827   MCHC 34.1 11/21/2014 0630   MCHC 34.2 02/21/2014 0827   RDW 12.8 11/21/2014 0630   RDW 13.4 02/21/2014 0827   LYMPHSABS 1.8 11/21/2014 0630   LYMPHSABS 1.9 02/21/2014 0827   MONOABS 0.6 11/21/2014 0630   MONOABS 0.5 02/21/2014 0827   EOSABS 0.2 11/21/2014 0630   EOSABS 0.2 02/21/2014 0827   BASOSABS 0.0 11/21/2014 0630   BASOSABS 0.0 02/21/2014 0827   CBG's (last 3):    Recent Labs  12/02/14 0302 12/02/14 0609 12/02/14 0745  GLUCAP 94 111* 121*   LFT's Lab Results  Component Value Date   ALT 13 11/21/2014   AST 17 11/21/2014   ALKPHOS 101 11/21/2014   BILITOT 0.7 11/21/2014    Studies/Results: No results found.  Medications:  I have reviewed the patient's current medications. Scheduled Medications: . aspirin EC  325 mg Oral Daily  . bacitracin   Topical Daily  . enoxaparin (LOVENOX) injection  40 mg Subcutaneous Q24H  . ezetimibe  10 mg Oral Daily  . famotidine  10 mg Oral BID  . finasteride  5 mg Oral Daily  . hydrochlorothiazide  6.25 mg Oral Daily  . insulin aspart  0-9 Units Subcutaneous TID WC  . insulin glargine  50 Units Subcutaneous Daily  . metFORMIN  500 mg Oral Q supper  . metFORMIN  850 mg Oral Q breakfast  . sodium chloride 0.9 % 1,000 mL  infusion   Intravenous Daily   PRN Medications: acetaminophen, alum & mag hydroxide-simeth, bisacodyl, food thickener, guaiFENesin-dextromethorphan, ondansetron **OR** ondansetron (ZOFRAN) IV, senna-docusate, traZODone  Assessment/Plan: Principal Problem:   Left pontine CVA Active Problems:   Type 2 diabetes mellitus   Right hemiparesis   Dysphagia, post-stroke  1. Functional deficits secondary to Left paracentral pontine stroke 2. DVT Prophylaxis/Anticoagulation: Pharmaceutical: Lovenox 3. Pain Management: Tylenol prn for back pain and K pad additionally.  4. Mood: LCSW to follow for evaluation and support.  5. Neuropsych: This patient is capable of making decisions on his own behalf. -waist belt in wheelchair, up at nurses station prn for safety--continue 6. Skin/Wound Care: Routine pressure  relief measures. normal hygiene to left great toe 7. Fluids/Electrolytes/Nutrition: Monitor I/O. Offer nutritional supplement prn if intake poor.IVF  8. DM type 2 with neuropathy: Will monitor BS with ac/hs checks. on lantus 50 units with resumed metformin, improved 9. BPH: Continue proscar. Monitor for any voiding difficulty.  10. HTN:  monitor blood pressure tid. On HCTZ PTA, but holding at this time. If BPs rising, will restart low dose 11. GERD:continue Pepcid   Length of stay, days: 12  [name], MD 12/02/2014, 10:04 AM

## 2014-12-02 NOTE — Progress Notes (Signed)
Pt. Non-compliant with the water protocol.  Observed drinking water out of faucet in room.  Patient confronted by RN and asked to call for assistance when drinking water.  Patient verbalized in agreement.

## 2014-12-03 ENCOUNTER — Inpatient Hospital Stay (HOSPITAL_COMMUNITY): Payer: Medicare Other

## 2014-12-03 ENCOUNTER — Inpatient Hospital Stay (HOSPITAL_COMMUNITY): Payer: Medicare Other | Admitting: Occupational Therapy

## 2014-12-03 ENCOUNTER — Inpatient Hospital Stay (HOSPITAL_COMMUNITY): Payer: Medicare Other | Admitting: Speech Pathology

## 2014-12-03 LAB — GLUCOSE, CAPILLARY
GLUCOSE-CAPILLARY: 226 mg/dL — AB (ref 70–99)
Glucose-Capillary: 96 mg/dL (ref 70–99)
Glucose-Capillary: 89 mg/dL (ref 70–99)
Glucose-Capillary: 179 mg/dL — ABNORMAL HIGH (ref 70–99)

## 2014-12-03 MED ORDER — METFORMIN HCL 500 MG PO TABS
1000.0000 mg | ORAL_TABLET | Freq: Every day | ORAL | Status: DC
  Administered 2014-12-04 – 2014-12-05 (×2): 1000 mg via ORAL
  Filled 2014-12-03 (×3): qty 2

## 2014-12-03 MED ORDER — PANTOPRAZOLE SODIUM 40 MG PO TBEC
40.0000 mg | DELAYED_RELEASE_TABLET | Freq: Every day | ORAL | Status: DC
  Administered 2014-12-03 – 2014-12-05 (×3): 40 mg via ORAL
  Filled 2014-12-03 (×3): qty 1

## 2014-12-03 NOTE — Progress Notes (Signed)
Physical Therapy Make up Session Note  Patient Details  Name: Chad Fuller MRN: [id number] Date of Birth: [birth year]  Today's Date: 12/03/2014 PT Individual Time: 1448 (make up session)-1540 PT Individual Time Calculation (min): 15 min   Short Term Goals: Week 2:  PT Short Term Goal 1 (Week 2): Pt will perform basic transfers with S 3/5 trials PT Short Term Goal 2 (Week 2): Pt will perform gait with RW x150 and min A PT Short Term Goal 3 (Week 2): Pt will ascend/descend 5 stairs with S PT Short Term Goal 4 (Week 2): Pt will improve Berg Balance test score by>6 points to demonstrate fall risk reduction PT Short Term Goal 5 (Week 2): Pt will initiate seated HS stretch with handout at start of each PT tx with S for safety.   Skilled Therapeutic Interventions/Progress Updates:   Pt received sitting in w/c in room, daughter present.  Pt agreeable to 15 min make up session this afternoon.  Session focused on OTAGO HEP.  Performed seated LAQ's x 10 reps BLE, standing hip abd x 10 reps BLE, standing knee flex x 10 reps BLE, standing mini squats x 10 reps and standing toe/heel raises x 10 reps each.  Provided facilitation and cues for upright posture throughout and for increased R hip/knee extension when performing exercises with LLE.  Required single seated rest break in between exercises.  Pt left in w/c with quick release belt donned and all needs in reach.   Therapy Documentation Precautions:  Precautions Precautions: Fall Precaution Comments: multiple falls at home; stated he falls up or down entry steps at home about once per month, due to "not knowing where his feet are" Restrictions Weight Bearing Restrictions: No   Pain: Pain Assessment Pain Assessment: No/denies pain   Locomotion : Ambulation Ambulation/Gait Assistance: 4: Min assist;4: Min guard;5: Supervision   See FIM for current functional status  Therapy/Group: Individual Therapy  Denice Bors 12/03/2014, 4:35 PM

## 2014-12-03 NOTE — Progress Notes (Signed)
Occupational Therapy Session Note  Patient Details  Name: Chad Fuller MRN: [id number] Date of Birth: 1926-04-15  Today's Date: 12/03/2014 OT Individual Time: 0800-0900 OT Individual Time Calculation (min): 60 min    Short Term Goals: Week 1:  OT Short Term Goal 1 (Week 1): Pt will ambulate to toilet with close S with RW. OT Short Term Goal 1 - Progress (Week 1): Met OT Short Term Goal 2 (Week 1): Pt will don his pants over his R foot. OT Short Term Goal 2 - Progress (Week 1): Met OT Short Term Goal 3 (Week 1): Pt will don sock and shoe on R foot.  OT Short Term Goal 3 - Progress (Week 1): Met OT Short Term Goal 4 (Week 1): Pt will stand at sink to groom with min A. OT Short Term Goal 4 - Progress (Week 1): Met Week 2:  OT Short Term Goal 1 (Week 2): STGs = LTGs       Skilled Therapeutic Interventions/Progress Updates:      Pt seen for BADL retraining of eating, bathing, and dressing with a focus on safety awareness, balance, functional mobility. Pt's breakfast tray arrived late and the first portion of the session pt worked on completing his breakfast set up to open some of the containers and supervision for taking small bites. Pt then doffed clothing from w/c and ambulated to shower with RW with close S. During shower pt continues to need safety cues to position his feet hip width prior to standing. He did stand with his feet close together 2x using bars for support with no LOB, but it is not the safest position.  After those cues to correct, pt stood up correctly during the rest of the session. Pt completed dressing with set up for TEDs. He only wanted to don his sandals, and refused to don the AFO. Spoke to his PT about this, that she may be able to convince him of why he needs the AFO. Pt stood at sink for grooming and then ambulated more than 150 feet to gym with RW with S and no R foot drag.  Pt stated he was determined to pick up his foot.  Pt sat on mat in gym to work on UE  exercises with 4# dowel bar. Pt fatigued quite easily with those exercises. He then worked on standing balance with no UE support for overhead reaching alternating arms and mini squats. His PT arrived for his next session.  Therapy Documentation Precautions:  Precautions Precautions: Fall Precaution Comments: multiple falls at home; stated he falls up or down entry steps at home about once per month, due to "not knowing where his feet are" Restrictions Weight Bearing Restrictions: No      Pain: Pain Assessment Pain Assessment: No/denies pain Pain Score: 0-No pain ADL: ADL ADL Comments: Refer to FIM  See FIM for current functional status  Therapy/Group: Individual Therapy  Lannie Heaps 12/03/2014, 11:19 AM

## 2014-12-03 NOTE — Progress Notes (Addendum)
Physical Therapy Session Note  Patient Details  Name: Chad Fuller MRN: [id number] Date of Birth: 1926-11-08  Today's Date: 12/03/2014 PT Individual Time: 4709-6283; 6629-4765 PT Individual Time Calculation (min): 60 min , 30 min  Short Term Goals: Week 2:  PT Short Term Goal 1 (Week 2): Pt will perform basic transfers with S 3/5 trials PT Short Term Goal 2 (Week 2): Pt will perform gait with RW x150 and min A PT Short Term Goal 3 (Week 2): Pt will ascend/descend 5 stairs with S PT Short Term Goal 4 (Week 2): Pt will improve Berg Balance test score by>6 points to demonstrate fall risk reduction PT Short Term Goal 5 (Week 2): Pt will initiate seated HS stretch with handout at start of each PT tx with S for safety.   Skilled Therapeutic Interventions/Progress Updates:   Tx 1:  OT reported that pt refused to wear R shoe and RAFO.  Discussed with pt; he thought he did need to wear it anymore because "his heels don't bang together" while ambulating anymore.  PT explained pt's need for RAFO is more about R foot clearance on stairs; hx of falls at home on stairs.  +2 with Therapeutic Rec Therapist for part of session.  neuromuscular re-education via forced use, demo, manual cues: -HS and heel cord stretches to facilitate balance strategies -gait x 150' on level tile with close supervision, min guard on turns; on carpet in home setting x 12' x 2 with min assist due to narrow BOS, R foot bumping L 75% of steps. No visible LOB.   Pt was unaware that this happened.  After heel cord stretching,pt donned RAFO with min assist..   +2  With Therapeutic Rec Therapist: Therapeutic activities in standing: reaching out of BOS with L and R hands to retrieve objects and place on target overhead.  No LOB, but mod cues to widen stance; kicking ball with R foot forward, L foot sideways to work on wt shifting and R stance stability.  Other activities-  reaching fine motor activity while in anterior pelvic  tilt via wedge under hips. Dynamic standing activity- removing clothing on hangers and hanging over front of RW, gait with clothes on RW min assist; standing while manipulating clothes on/off RW with supervision.  Pt unable to button shirt button to keep it on hanger, and did not have any compensatory ideas to remedy this.  Stand>< sit x 2 on couch with RW in ADL room with mod cues, mod assist due to lack of eccentric control and hand placement for safety.  Pt reported that he needed to use BR for BM.  S for toilet transfer and toileting.  Pt left resting in w/c, quick release belt applied and all needs within reach.  Tx 2  Daughter [name] here to observe tx and participate in car transfer, as pt probably will be driven to SNF by her Wed.  PT explained purpose of RAFO, and recommendation of diabetic shoes after d/c home to address frequent edema LLE (per pt) due to knee replacement many years ago.  Pt performed simulated car transfer to sedan height seat, with min guard assist with PT, RW.  Pt reported that he has a 4WW at home, but this therapist does not recommend it at this time due to balance deficits.  Without RW, pt performed car transfer with [name] helping, x 2.  Each time, pt did not pivot around fully to sit in w/c, and sat with poor control.  [name]  was reluctant to give pt cues.  She would benefit from another session of training.  She plans to come to pt's PT session in the AM.  Up/down 5 steps 2 rails with min guard x 1, close S x 1.  Pt is slightly impulsive on stairs, and does not want to use step to method.  When using 2 rails, he is safe with step through method, although pt's R heel caught x 1 during descent and R toe caught on edge of step x 1 during ascent.  Pt did not have LOB.  Quick release belt applied and pt left resting in w/c in his room, [name] with him.    Therapy Documentation Precautions:  Precautions Precautions: Fall Precaution Comments: multiple falls at home;  stated he falls up or down entry steps at home about once per month, due to "not knowing where his feet are" Restrictions Weight Bearing Restrictions: No   Pain: Pain Assessment Pain Assessment: No/denies pain Pain Score: 0-No pain    See FIM for current functional status  Therapy/Group: Individual Therapy  Arna Luis 12/03/2014, 10:56 AM

## 2014-12-03 NOTE — Progress Notes (Signed)
80 year old male with history of DM type 2 with neuropathy, PE, NSCLC s/p resection, CAD who was admitted on 11/18/14 with slurred speech, dizziness and difficulty walking with fall. CT head without acute changes, generalized atrophy and right forehead contusion. CT cervical spine with stenosis C3/4 to C 5/6. MRI/MRA brain with Small acute brainstem infarct in the left paracentral pons and no significant stenosis.  2D echo with EF 60%, AV sclerosis and mitral annular calcification without stenosis. Carotid dopplers without significant stenosis. Neurology recommended ASA for thrombotic stroke due to SVD. Speech therapy evaluation with dysphagia and FEES done  Subjective/Complaints: Pt wants the area behind R ear checked Review of Systems - neg except for above Objective: Vital Signs: Blood pressure 123/86, pulse 81, temperature 97.7 F (36.5 C), temperature source Oral, resp. rate 18, height 6\' 2"  (1.88 m), weight 100.562 kg (221 lb 11.2 oz), SpO2 95 %. No results found. Results for orders placed or performed during the hospital encounter of 11/20/14 (from the past 72 hour(s))  Glucose, capillary     Status: Abnormal   Collection Time: 11/30/14 11:22 AM  Result Value Ref Range   Glucose-Capillary 132 (H) 70 - 99 mg/dL  Glucose, capillary     Status: Abnormal   Collection Time: 11/30/14  4:35 PM  Result Value Ref Range   Glucose-Capillary 174 (H) 70 - 99 mg/dL   Comment 1 Notify RN   Glucose, capillary     Status: Abnormal   Collection Time: 11/30/14  9:58 PM  Result Value Ref Range   Glucose-Capillary 195 (H) 70 - 99 mg/dL  Glucose, capillary     Status: Abnormal   Collection Time: 12/01/14  3:48 AM  Result Value Ref Range   Glucose-Capillary 100 (H) 70 - 99 mg/dL  Glucose, capillary     Status: Abnormal   Collection Time: 12/01/14  6:02 AM  Result Value Ref Range   Glucose-Capillary 105 (H) 70 - 99 mg/dL  Glucose, capillary     Status: None   Collection Time: 12/01/14  6:55 AM   Result Value Ref Range   Glucose-Capillary 95 70 - 99 mg/dL  Glucose, capillary     Status: Abnormal   Collection Time: 12/01/14 12:01 PM  Result Value Ref Range   Glucose-Capillary 224 (H) 70 - 99 mg/dL  Glucose, capillary     Status: Abnormal   Collection Time: 12/01/14  4:14 PM  Result Value Ref Range   Glucose-Capillary 219 (H) 70 - 99 mg/dL   Comment 1 Notify RN   Glucose, capillary     Status: Abnormal   Collection Time: 12/01/14 10:06 PM  Result Value Ref Range   Glucose-Capillary 177 (H) 70 - 99 mg/dL  Glucose, capillary     Status: None   Collection Time: 12/02/14  3:02 AM  Result Value Ref Range   Glucose-Capillary 94 70 - 99 mg/dL  Glucose, capillary     Status: Abnormal   Collection Time: 12/02/14  6:09 AM  Result Value Ref Range   Glucose-Capillary 111 (H) 70 - 99 mg/dL  Glucose, capillary     Status: Abnormal   Collection Time: 12/02/14  7:45 AM  Result Value Ref Range   Glucose-Capillary 121 (H) 70 - 99 mg/dL  Glucose, capillary     Status: Abnormal   Collection Time: 12/02/14 11:41 AM  Result Value Ref Range   Glucose-Capillary 259 (H) 70 - 99 mg/dL  Glucose, capillary     Status: Abnormal   Collection Time:  12/02/14  4:39 PM  Result Value Ref Range   Glucose-Capillary 213 (H) 70 - 99 mg/dL  Glucose, capillary     Status: Abnormal   Collection Time: 12/02/14  9:58 PM  Result Value Ref Range   Glucose-Capillary 173 (H) 70 - 99 mg/dL  Glucose, capillary     Status: None   Collection Time: 12/03/14  3:09 AM  Result Value Ref Range   Glucose-Capillary 96 70 - 99 mg/dL  Glucose, capillary     Status: None   Collection Time: 12/03/14  6:42 AM  Result Value Ref Range   Glucose-Capillary 89 70 - 99 mg/dL     HEENT: small cyst at jct between auricle and lateral scalp on right Cardio: RRR and no murmurs Resp: CTA B/L and unlabored GI: BS positive and NT,ND Extremity:  Pulses positive and No Edema Skin:   Intact and Wound R forehead ecchymosis, faint, non  tender. Right first toe with fungus on nail but no drainage or breakdown. Really no erythema. Non-tender. Neuro: Alert/Oriented, Abnormal Sensory decreased LT sensation bilateral feet, Abnormal Motor 3- R delt, bi, tri, grip, HF, KE, ADF and Abnormal FMC Ataxic/ dec FMC, speech dysarthric, right facial droop. Impulsive, decreased insight and awareness Musc/Skel:  No pain with UE ROM Gen NAD   Assessment/Plan: 1. 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 Physiatrist is providing close team supervision and 24 hour management of active medical problems listed below. Physiatrist and rehab team continue to assess barriers to discharge/monitor patient progress toward functional and medical goals.  FIM: FIM - Bathing Bathing Steps Patient Completed: Chest, Right Arm, Left Arm, Abdomen, Left upper leg, Right upper leg, Buttocks, Front perineal area, Left lower leg (including foot), Right lower leg (including foot) Bathing: 5: Supervision: Safety issues/verbal cues  FIM - Upper Body Dressing/Undressing Upper body dressing/undressing steps patient completed: Thread/unthread right sleeve of pullover shirt/dresss, Thread/unthread left sleeve of pullover shirt/dress, Put head through opening of pull over shirt/dress, Pull shirt over trunk Upper body dressing/undressing: 5: Set-up assist to: Obtain clothing/put away FIM - Lower Body Dressing/Undressing Lower body dressing/undressing steps patient completed: Thread/unthread left pants leg, Pull pants up/down, Don/Doff left sock, Don/Doff left shoe, Fasten/unfasten left shoe, Thread/unthread right pants leg, Don/Doff right shoe, Don/Doff right sock, Fasten/unfasten right shoe Lower body dressing/undressing: 5: Supervision: Safety issues/verbal cues  FIM - Toileting Toileting steps completed by patient: Adjust  clothing prior to toileting, Performs perineal hygiene, Adjust clothing after toileting Toileting Assistive Devices: Grab bar or rail for support Toileting: 5: Supervision: Safety issues/verbal cues  FIM - Radio producer Devices: Elevated toilet seat, Grab bars, Insurance account manager Transfers: 4-To toilet/BSC: Min A (steadying Pt. > 75%)  FIM - Control and instrumentation engineer Devices: Arm rests, Copy: 5: Bed > Chair or W/C: Supervision (verbal cues/safety issues), 4: Chair or W/C > Bed: Min A (steadying Pt. > 75%)  FIM - Locomotion: Wheelchair Distance: 150 Locomotion: Wheelchair: 0: Activity did not occur FIM - Locomotion: Ambulation Locomotion: Ambulation Assistive Devices: Administrator Ambulation/Gait Assistance: 5: Supervision, 4: Min guard Locomotion: Ambulation: 4: Travels 150 ft or more with minimal assistance (Pt.>75%)  Comprehension Comprehension Mode: Auditory Comprehension: 5-Follows basic conversation/direction: With extra time/assistive device  Expression Expression Mode: Verbal Expression: 5-Expresses basic needs/ideas: With extra time/assistive device  Social Interaction Social Interaction: 5-Interacts appropriately 90% of the time -  Needs monitoring or encouragement for participation or interaction.  Problem Solving Problem Solving Mode: Not assessed Problem Solving: 4-Solves basic 75 - 89% of the time/requires cueing 10 - 24% of the time  Memory Memory: 4-Recognizes or recalls 75 - 89% of the time/requires cueing 10 - 24% of the time  Medical Problem List and Plan: 1. Functional deficits secondary to Left paracentral pontine stroke 2.  DVT Prophylaxis/Anticoagulation: Pharmaceutical: Lovenox 3. Pain Management: Tylenol prn for back pain. Will add K pad additionally.   4. Mood: LCSW to follow for evaluation and support.   5. Neuropsych: This patient is capable of making decisions on his own  behalf.  -waist belt in wheelchair, up at nurses station prn for safety--continue 6. Skin/Wound Care: Routine pressure relief measures.   -normal hygiene to left great toe is all that's required 7. Fluids/Electrolytes/Nutrition: Monitor I/O. Offer nutritional supplement prn if intake poor.IVF   8. DM type 2 with neuropathy: Will monitor BS with ac/hs checks. reduce lantus to 50 units and resumed metformin  -continue 850 metformin in am and decreased pm dose to 500mg --improved, no am hypoglycemia, some elevation around noon increase am metformin 9.  BPH: Continue proscar. Monitor for any voiding difficulty.    10.  HTN: Will monitor blood pressure tid. .  intake improving, BPs  Controlled                                                                 11. GERD:  added Pepcid for symptoms.     LOS (Days) 13 A FACE TO FACE EVALUATION WAS PERFORMED  Jya Hughston E 12/03/2014, 7:40 AM

## 2014-12-03 NOTE — Plan of Care (Signed)
Problem: RH BOWEL ELIMINATION Goal: RH STG MANAGE BOWEL WITH ASSISTANCE STG Manage Bowel with Assistance. Mod I  Outcome: Completed/Met Date Met:  12/03/14  Problem: RH BLADDER ELIMINATION Goal: RH STG MANAGE BLADDER WITH ASSISTANCE STG Manage Bladder With Assistance. Mod I  Outcome: Completed/Met Date Met:  12/03/14  Problem: RH SKIN INTEGRITY Goal: RH STG SKIN FREE OF INFECTION/BREAKDOWN Skin to remain free from infection/breakdown while on rehab with min assist  Outcome: Progressing  Problem: RH SAFETY Goal: RH STG ADHERE TO SAFETY PRECAUTIONS W/ASSISTANCE/DEVICE STG Adhere to Safety Precautions With Assistance/Device. Supervision  Outcome: Progressing  Problem: RH PAIN MANAGEMENT Goal: RH STG PAIN MANAGED AT OR BELOW PT'S PAIN GOAL <4  Outcome: Progressing

## 2014-12-03 NOTE — Progress Notes (Signed)
Speech Language Pathology Daily Session Note  Patient Details  Name: Chad Fuller MRN: [id number] Date of Birth: August 18, 1926  Today's Date: 12/03/2014 SLP Individual Time: 6629-4765 SLP Individual Time Calculation (min): 30 min  Short Term Goals: Week 2: SLP Short Term Goal 1 (Week 2): Pt will tolerate presentations of upgraded consistencies with no overt s/s of aspiration with supervision cues for use of swallowing precautions.  SLP Short Term Goal 2 (Week 2): Pt will improve functional problem solving for basic tasks for 75% accuracy with min assist.  SLP Short Term Goal 3 (Week 2): Pt will improve use of compensatory strategies to facilitate recall of new information for 75% accuracy with min assist  SLP Short Term Goal 4 (Week 2): Pt will improve speech intelligibility at the conversational level to >90% with supervision cues for use of dysarthria strategies.  SLP Short Term Goal 5 (Week 2): Pt will complete pharyngeal and oral motor strengthening exercises to improve right sided weakness and overall swallowing function with min cues   Skilled Therapeutic Interventions:  Pt was seen for skilled ST targeting dysphagia goals.  Upon arrival, pt was standing up at sink with assistance from nurse tech for washing his hands.  SLP provided supervision cues for pt to complete thorough oral care prior to initiation of regular water trials.  Furthermore, SLP completed skilled observations with the abovementioned trials with pt exhibiting no overt s/s of aspiration noted at bedside.  SLP reviewed and reinforced skilled dysphagia education including pt's risk of silent aspiration due to persisting decreased sensation of penetrates/aspirate occurring s/p pontine stroke per most recent FEES.  Pt's daughter was present for the duration of today's therapy session and verbalized understanding of all provided training.  Continue per current plan of care.    FIM:  Comprehension Comprehension Mode:  Auditory Comprehension: 5-Follows basic conversation/direction: With extra time/assistive device Expression Expression Mode: Verbal Expression: 5-Expresses basic needs/ideas: With extra time/assistive device Social Interaction Social Interaction: 5-Interacts appropriately 90% of the time - Needs monitoring or encouragement for participation or interaction. Problem Solving Problem Solving: 4-Solves basic 75 - 89% of the time/requires cueing 10 - 24% of the time Memory Memory: 5-Recognizes or recalls 90% of the time/requires cueing < 10% of the time FIM - Eating Eating Activity: 5: Set-up assist for open containers;5: Supervision/cues  Pain Pain Assessment Pain Assessment: No/denies pain  Therapy/Group: Individual Therapy  Chudney Scheffler, Selinda Orion 12/03/2014, 2:21 PM

## 2014-12-03 NOTE — Progress Notes (Signed)
Social Work Patient ID: Chad Fuller, male   DOB: Mar 15, 1926, 80 y.o.   MRN: [id number] Met with pt and daughter-[name] to discuss FL2 faxed to [facility] and will contact theme regarding a bed for pt.  Pt states; " You had better get moving I only have two more days here." Pt wants to be transported by car so [name]-PT was going to have daughter attend therapy and do a simulated car transfer for education for when pt is being discharged from rehab. Will update when speak with admission coordinator from [place].

## 2014-12-03 NOTE — Progress Notes (Signed)
Recreational Therapy Session Note  Patient Details  Name: Chad Fuller MRN: [id number] Date of Birth: [birth year] Today's Date: 12/03/2014  Pain: no c/o Skilled Therapeutic Interventions/Progress Updates: Session focused on activity tolerance, dynamic standing balance, safety awareness.  Pt stood for horseshoe/ basketball activity reaching outside BOS for items & placing on basketball goal.  Pt alsos stood to kick a ball forward with RLE & sideways with LLE with min assist.  Therapy/Group: Co-Treatment   Lydiah Pong 12/03/2014, 12:14 PM

## 2014-12-04 ENCOUNTER — Inpatient Hospital Stay (HOSPITAL_COMMUNITY): Payer: Medicare Other | Admitting: Occupational Therapy

## 2014-12-04 ENCOUNTER — Inpatient Hospital Stay (HOSPITAL_COMMUNITY): Payer: Medicare Other | Admitting: Speech Pathology

## 2014-12-04 ENCOUNTER — Inpatient Hospital Stay (HOSPITAL_COMMUNITY): Payer: Medicare Other | Admitting: *Deleted

## 2014-12-04 DIAGNOSIS — I6350 Cerebral infarction due to unspecified occlusion or stenosis of unspecified cerebral artery: Secondary | ICD-10-CM

## 2014-12-04 DIAGNOSIS — G8190 Hemiplegia, unspecified affecting unspecified side: Secondary | ICD-10-CM

## 2014-12-04 DIAGNOSIS — I69391 Dysphagia following cerebral infarction: Secondary | ICD-10-CM

## 2014-12-04 LAB — BASIC METABOLIC PANEL
ANION GAP: 12 (ref 5–15)
BUN: 15 mg/dL (ref 6–23)
CHLORIDE: 100 meq/L (ref 96–112)
CO2: 26 meq/L (ref 19–32)
Calcium: 8.9 mg/dL (ref 8.4–10.5)
Creatinine, Ser: 0.66 mg/dL (ref 0.50–1.35)
GFR calc Af Amer: >90 mL/min (ref >90)
GFR calc non Af Amer: 85 mL/min — ABNORMAL LOW (ref >90)
Glucose, Bld: 98 mg/dL (ref 70–99)
POTASSIUM: 4.5 meq/L (ref 3.7–5.3)
SODIUM: 138 meq/L (ref 137–147)

## 2014-12-04 LAB — GLUCOSE, CAPILLARY
GLUCOSE-CAPILLARY: 221 mg/dL — AB (ref 70–99)
GLUCOSE-CAPILLARY: 215 mg/dL — AB (ref 70–99)
Glucose-Capillary: 110 mg/dL — ABNORMAL HIGH (ref 70–99)
Glucose-Capillary: 104 mg/dL — ABNORMAL HIGH (ref 70–99)
Glucose-Capillary: 98 mg/dL (ref 70–99)
Glucose-Capillary: 127 mg/dL — ABNORMAL HIGH (ref 70–99)
Glucose-Capillary: 221 mg/dL — ABNORMAL HIGH (ref 70–99)

## 2014-12-04 LAB — CREATININE, SERUM
CREATININE: 0.63 mg/dL (ref 0.50–1.35)
GFR calc Af Amer: >90 mL/min (ref >90)
GFR calc non Af Amer: 87 mL/min — ABNORMAL LOW (ref >90)

## 2014-12-04 MED ORDER — ACETAMINOPHEN 325 MG PO TABS: 325.0000 mg | ORAL_TABLET | ORAL | Status: DC | PRN

## 2014-12-04 MED ORDER — STARCH (THICKENING) PO POWD: 1.0000 g | ORAL | Status: DC | PRN

## 2014-12-04 MED ORDER — PANTOPRAZOLE SODIUM 40 MG PO TBEC: 40.0000 mg | DELAYED_RELEASE_TABLET | Freq: Every day | ORAL | Status: DC

## 2014-12-04 MED ORDER — INSULIN GLARGINE 100 UNIT/ML ~~LOC~~ SOLN: 50.0000 [IU] | Freq: Every day | SUBCUTANEOUS | Status: DC

## 2014-12-04 MED ORDER — ASPIRIN 325 MG PO TBEC: 325.0000 mg | DELAYED_RELEASE_TABLET | Freq: Every day | ORAL | Status: DC

## 2014-12-04 MED ORDER — HYDROCHLOROTHIAZIDE 10 MG/ML ORAL SUSPENSION: 6.2500 mg | Freq: Every day | ORAL | Status: DC

## 2014-12-04 MED ORDER — METFORMIN HCL ER 500 MG PO TB24: ORAL_TABLET | ORAL | Status: DC

## 2014-12-04 NOTE — Progress Notes (Signed)
Physical Therapy Discharge Summary  Patient Details  Name: Chad Fuller MRN: [id number] Date of Birth: [birth year]  Today's Date: 12/04/2014 PT Individual Time: 0803-0905 PT Individual Time Calculation (min): 62 min    Patient has met 11 of 13 long term goals due to improved activity tolerance, improved balance, improved postural control, increased strength, decreased pain, ability to compensate for deficits, functional use of  right upper extremity and right lower extremity, improved attention, improved awareness and improved coordination.  Patient to discharge at an ambulatory level [place].  Pt will DC to SNF for continued therapy, where he will receive necessary physical and cognitive assistance in order to increase independence for eventual safe return home.   Reasons goals not met: Pt did not meet floor transfer goal due to unsafe to attempt at time of DC. Pt also needed periodic Min A during gait due to LOB, thus did not reach S level. Pt will have Min A level of care available at next venue.   Recommendation:  Patient will benefit from ongoing skilled PT services in skilled nursing facility setting to continue to advance safe functional mobility, address ongoing impairments in motor control, balance, cognition, and minimize fall risk.  Equipment: Pt recived AFO during inpatient stay. PT recommends that upon DC from SNF, pt recieve standard WIDE RW for safe mobility as well as referral for diabetic shoes to proteect feet and reduce skin break down.   Reasons for discharge: treatment goals met and discharge from hospital  Patient/family agrees with progress made and goals achieved: Yes   Skilled PT tx focused on functional mobility training, gait with RW, stairs, Berg balance test, and car transfers. Family not present for final education/training for car and other mobility teaching.  Pt propelled WC 1x150' with bil LEs/UEs for increased strengthening and coordination. Pt engaged  in stair training x15 with bil rails and cues for full foot placement on each step. Min steadying assist needed due to decreased accuracy of foot placement, esp with descent. Pt performed gait x200' in controlled setting and x40' in apartment/home setting with RW. Pt needed S 75% of the time, but Min A occasionally due to R LOB during turns, busy situations, and when RW not directly in front. Pt not significantly able to adjust gait pattern with verbal or tactile cues. Pt continues to walk with narrow BOS, heels crossing, shuffling steps, and forward flexed posture. Orthotist was contacted to add leather toe cap to reduce friction and fall risk. Pt was at S level in apartment setting for all mobility and gait, including furniture transfers. Pt  Continues to need hand placement cues. NMR provided throughout for multi-modal cues for weight shifting and postural control. Pt educated on score findings of Berg Balance test including functional limitations. Pt left up in [facility] with all needs in reach.   PT Discharge Precautions/Restrictions Precautions Precautions: Fall Precaution Comments: multiple falls at home; stated he falls up or down entry steps at home about once per month, due to "not knowing where his feet are" Restrictions Weight Bearing Restrictions: No Vital Signs Therapy Vitals Pulse Rate: 82 BP: 134/76 mmHg Pain - none   Vision/Perception  Vision - History Baseline Vision: Wears glasses all the time Patient Visual Report: No change from baseline Vision - Assessment Eye Alignment: Within Functional Limits Perception Comments: WFL Praxis Praxis: Intact  Cognition Overall Cognitive Status: Impaired/Different from baseline Orientation Level: Oriented X4 Memory: Impaired Memory Impairment: Decreased recall of new information;Decreased short term memory Awareness: Impaired  Awareness Impairment: Emergent impairment Safety/Judgment: Impaired Comments: Pt does not heed safety warnings,  "I'm not gonna fall." Sensation Sensation Light Touch: Impaired Detail Light Touch Impaired Details: Absent RLE;Impaired LLE (absent R great toe ) Stereognosis: Appears Intact Hot/Cold: Appears Intact Proprioception: Appears Intact Coordination Gross Motor Movements are Fluid and Coordinated: Yes Fine Motor Movements are Fluid and Coordinated: No Motor  Motor Motor: Hemiplegia Motor - Discharge Observations: Improved trunk control, ataxia, and gross motor control. [place] still requires increased time and with decreased accuracy  Mobility Bed Mobility Bed Mobility: Supine to Sit;Sit to Supine Supine to Sit: 6: Modified independent (Device/Increase time) Sit to Supine: 6: Modified independent (Device/Increase time) Transfers Transfers: Yes Stand Pivot Transfers: 5: Supervision Stand Pivot Transfer Details (indicate cue type and reason): Pt need cues for safe hand placemnt Locomotion  Ambulation Ambulation: Yes Ambulation/Gait Assistance: 5: Supervision;4: Min guard Ambulation Distance (Feet): 200 Feet Assistive device: Rolling walker Ambulation/Gait Assistance Details: Pt continues to need safety cues for posture, RW proximity, and R foot clearance. Pt tends to catch R toe at times, and has decreased control of RW during turns.  Gait Gait: Yes Gait Pattern: Impaired Gait Pattern: Decreased stride length;Decreased dorsiflexion - right;Shuffle Gait velocity: decreased Stairs / Additional Locomotion Stairs: Yes Stairs Assistance: 4: Min guard Stair Management Technique: Two rails;Alternating pattern;Backwards;Forwards Number of Stairs: 15 Height of Stairs: 6 Architect: Yes Wheelchair Assistance: 5: Careers information officer: Both upper extremities;Both lower extermities Wheelchair Parts Management: Needs assistance Distance: 150  Trunk/Postural Assessment  Cervical Assessment Cervical Assessment: Exceptions to [facility] (forward  head) Thoracic Assessment Thoracic Assessment: Exceptions to [facility] (kyphotic) Lumbar Assessment Lumbar Assessment: Exceptions to [facility] (Forward flexed) Postural Control Postural Control: Deficits on evaluation Trunk Control: leans L with poor awareness Righting Reactions: dealyed but improved since eval, still relies on UEs Postural Limitations: No back pain with unsupported sitting  Balance Balance Balance Assessed: Yes Standardized Balance Assessment Standardized Balance Assessment: Berg Balance Test Berg Balance Test Sit to Stand: Able to stand  independently using hands Standing Unsupported: Able to stand 2 minutes with supervision Sitting with Back Unsupported but Feet Supported on Floor or Stool: Able to sit safely and securely 2 minutes Stand to Sit: Controls descent by using hands Transfers: Able to transfer with verbal cueing and /or supervision Standing Unsupported with Eyes Closed: Able to stand 10 seconds with supervision Standing Ubsupported with Feet Together: Able to place feet together independently and stand for 1 minute with supervision From Standing, Reach Forward with Outstretched Arm: Can reach confidently >25 cm (10") From Standing Position, Pick up Object from Floor: Able to pick up shoe, needs supervision From Standing Position, Turn to Look Behind Over each Shoulder: Needs assist to keep from losing balance and falling Turn 360 Degrees: Able to turn 360 degrees safely but slowly Standing Unsupported, Alternately Place Feet on Step/Stool: Able to complete >2 steps/needs minimal assist Standing Unsupported, One Foot in Front: Able to take small step independently and hold 30 seconds Standing on One Leg: Tries to lift leg/unable to hold 3 seconds but remains standing independently Total Score: 34 Dynamic Sitting Balance Dynamic Sitting - Level of Assistance: 5: Stand by assistance Sitting balance - Comments: Trunk control improved today, able to reach outside BoS  without LOB.  Static Standing Balance Static Standing - Level of Assistance: 5: Stand by assistance Dynamic Standing Balance Dynamic Standing - Level of Assistance: 5: Stand by assistance Extremity Assessment  RUE Assessment RUE Assessment: Within Functional Limits LUE  Assessment LUE Assessment: Within Functional Limits RLE Assessment RLE Assessment: Within Functional Limits (Heel cord and HS tight, but functional. pt now with AFO ) RLE Strength RLE Overall Strength Comments: 4/5 strength overall LLE Assessment LLE Assessment: Within Functional Limits (tight hamstrings and heel cord tight, but functional. ) LLE Strength LLE Overall Strength Comments: 4/5 overall  See FIM for current functional status   Kennieth Rad, PT, DPT  12/04/2014, 11:19 AM

## 2014-12-04 NOTE — Plan of Care (Signed)
Problem: RH SAFETY Goal: RH STG ADHERE TO SAFETY PRECAUTIONS W/ASSISTANCE/DEVICE STG Adhere to Safety Precautions With Assistance/Device. Supervision  Outcome: Progressing

## 2014-12-04 NOTE — Discharge Summary (Addendum)
Physician Discharge Summary  Patient ID: Chad Fuller MRN: [id number] DOB/AGE: 01/13/1926 80 y.o.  Admit date: 11/20/2014 Discharge date: 12/05/2014  Discharge Diagnoses:  Principal Problem:   Left pontine CVA Active Problems:   Type 2 diabetes mellitus   Right hemiparesis   Dysphagia, post-stroke   Discharged Condition: Stable   Labs:  Basic Metabolic Panel:  Recent Labs Lab 11/28/14 1222 12/04/14 0402  NA 137 138  K 4.6 4.5  CL 99 100  CO2 24 26  GLUCOSE 126* 98  BUN 17 15  CREATININE 0.58 0.66  0.63  CALCIUM 9.0 8.9    CBC: CBC Latest Ref Rng 11/21/2014 11/19/2014 11/18/2014  WBC 4.0 - 10.5 K/uL 7.4 6.4 7.0  Hemoglobin 13.0 - 17.0 g/dL 14.0 13.9 14.2  Hematocrit 39.0 - 52.0 % 41.1 40.7 41.1  Platelets 150 - 400 K/uL 226 225 217     CBG:  Recent Labs Lab 12/04/14 1144 12/04/14 1640 12/04/14 2127 12/05/14 0322 12/05/14 0644  GLUCAP 127* 215* 221* 148* 129*   Today's Vitals   12/04/14 0440 12/04/14 0756 12/04/14 1543 12/05/14 0532  BP: 121/73 134/76 142/72 128/68  Pulse: 85 82 89 86  Temp: 97.7 F (36.5 C)  98.1 F (36.7 C) 97.7 F (36.5 C)  TempSrc: Oral  Oral Oral  Resp: 17  17 18   Height:      Weight:      SpO2: 96%  100% 98%  PainSc:        Brief HPI:   Chad Fuller is an 80 year old male with history of DM type 2 with neuropathy, PE, NSCLC s/p resection, CAD who was admitted on 11/18/14 with slurred speech, dizziness and difficulty walking with fall. CT head without acute changes, generalized atrophy and right forehead contusion. CT cervical spine with stenosis C3/4 to C 5/6. MRI/MRA brain with Small acute brainstem infarct in the left paracentral pons and no significant stenosis.  Neurology recommended ASA for thrombotic stroke due to SVD. Speech therapy evaluation with dysphagia and FEES done today and patient to continue on Dysphagia 3, nectar liquids by spoon. Patient with resultant ataxia, right sided weakness with RLE  instability, dysphagia as well as apraxia. Therapy ongoing and CIR recommended for follow up therapy   Hospital Course: CAMAR GUYTON was admitted to rehab 11/20/2014 for inpatient therapies to consist of PT, ST and OT at least three hours five days a week. Past admission physiatrist, therapy team and rehab RN have worked together to provide customized collaborative inpatient rehab. Blood pressures were monitored on tid basis and have been well controlled.  He was maintained on dysphagia diet with nectar liquids by teaspoon therefore IVF were added for hydration at nights to maintain adequate hydration. This was discontinued on 12/01 as patient had adequate intake and renal status was stable. He was started on water protocol and is tolerating this without signs or symptoms of aspiration. Diabetes has been monitored on ac/hs basis and insulin dose has been decreased due to hypoglycemic episodes. Blood sugars are well controlled on current diet. He continues to have poor insight with impaired safety awareness regarding balance deficits as well as distraction.    Rehab course: During patient's stay in rehab weekly team conferences were held to monitor patient's progress, set goals and discuss barriers to discharge.  At admission, patient required moderate assist with ADL tasks and +2 total assist with mobility .  Patient has had improvement in activity tolerance, balance, postural control, RLE strength, decrease  in ataxia, as well as ability to compensate for deficits.  He is able to complete self care tasks with set up assist and supervision.  He requires supervision for transfers and for ambulating in a straight path in controlled environment. He requires min to guard assist with transitional movements or when distracted. He is tolerating dysphagia 3 diet and requires supervision for oral care protocol prior to water trials. He is able to recall 2/3 compensatory strategies to compensated for dysarthria.    MoCA score is 24/30 indicating slight decrease below normal but is likely at patient's baseline cognition. He requires min assist with extra time for processing and functional problem solving due to deficits in organization as well as mental inflexibility.     Disposition:  [name].   Diet: Dysphagia 3, diabetic restrictions. Nectar liquids by tsp.   Special Instructions: 1. Needs full supervision at meals. No straws. Assist with set up. 2. Water protocol between meals.  3.  Check blood sugars ac/hs basis and use SSI per protocol for elevated BS.     Medication List    STOP taking these medications        GLUCOSAMINE CHONDROITIN COMPLX PO     silver sulfADIAZINE 1 % cream  Commonly known as:  SILVADENE      TAKE these medications        acetaminophen 325 MG tablet  Commonly known as:  TYLENOL  Take 1-2 tablets (325-650 mg total) by mouth every 4 (four) hours as needed for mild pain.     aspirin 325 MG EC tablet  Take 1 tablet (325 mg total) by mouth daily.     ezetimibe 10 MG tablet  Commonly known as:  ZETIA  Take 10 mg by mouth daily.     finasteride 5 MG tablet  Commonly known as:  PROSCAR  Take 5 mg by mouth daily.     food thickener Powd  Commonly known as:  THICK IT  Take 1 g by mouth as needed (to thicken liquids to nectar consistency).     hydrochlorothiazide 10 mg/mL Susp  Take 0.63 mLs (6.25 mg total) by mouth daily.     insulin glargine 100 UNIT/ML injection  Commonly known as:  LANTUS  Inject 0.5 mLs (50 Units total) into the skin daily.     metFORMIN 500 MG 24 hr tablet  Commonly known as:  GLUCOPHAGE-XR  Take 500 mg with breakfast and 1000 mg with supper     multivitamins ther. w/minerals Tabs tablet  Take 1 tablet by mouth daily.     pantoprazole 40 MG tablet  Commonly known as:  PROTONIX  Take 1 tablet (40 mg total) by mouth daily.     tamsulosin 0.4 MG Caps capsule  Commonly known as:  FLOMAX  Take 0.4 mg by mouth  daily.       Follow-up Information    Follow up with Charlett Blake, MD On 01/21/2015.   Specialty:  Physical Medicine and Rehabilitation   Why:  Be there at 11:00am  for 11:30 am  appointment   Contact information:   [facility] [place] [address] [phone]       Follow up with Antony Contras, MD. Call today.   Specialties:  Neurology, Radiology   Why:  for   appointment   Contact information:   [address] [place] [address] [phone]       Signed: Bary Leriche 12/05/2014, 9:03 AM

## 2014-12-04 NOTE — Plan of Care (Signed)
Problem: RH BOWEL ELIMINATION Goal: RH STG MANAGE BOWEL W/MEDICATION W/ASSISTANCE STG Manage Bowel with Medication with min Assistance.  Outcome: Completed/Met Date Met:  12/04/14  Problem: RH SAFETY Goal: RH STG ADHERE TO SAFETY PRECAUTIONS W/ASSISTANCE/DEVICE STG Adhere to Safety Precautions With Assistance/Device. Supervision  Outcome: Completed/Met Date Met:  12/04/14  Problem: RH KNOWLEDGE DEFICIT Goal: RH STG INCREASE KNOWLEDGE OF DIABETES Patient will state s/s of hypo/hyperglycemia, dietary and medication management with mod assist  Outcome: Progressing     

## 2014-12-04 NOTE — Progress Notes (Signed)
Social Work Patient ID: Chad Fuller, male   DOB: [birth year], 80 y.o.   MRN: [id number] Spoke with [facility] who has offered pt a bed for tomorrow.  Pt and family have agreed due to this is pt's first choice. The daughter was trained yesterday in car transfers and this is the way pt wants to go.  The plan is family to be here at 10;;00 tomorrow, To pack up and take to [facility].

## 2014-12-04 NOTE — Plan of Care (Signed)
Problem: RH Ambulation Goal: LTG Patient will ambulate in controlled environment (PT) LTG: Patient will ambulate in a controlled environment, # of feet with assistance (PT).  Outcome: Not Met (add Reason) Pt able to ambulate with S in straight path situations, but during transitional movements and busy/distracted settings, he needs Min-guard A for safety.

## 2014-12-04 NOTE — Progress Notes (Signed)
Occupational Therapy Session Note  Patient Details  Name: Chad Fuller MRN: [id number] Date of Birth: May 29, 1926  Today's Date: 12/04/2014 OT Individual Time: 1010-1110 OT Individual Time Calculation (min): 60 min    Short Term Goals: Week 1:  OT Short Term Goal 1 (Week 1): Pt will ambulate to toilet with close S with RW. OT Short Term Goal 1 - Progress (Week 1): Met OT Short Term Goal 2 (Week 1): Pt will don his pants over his R foot. OT Short Term Goal 2 - Progress (Week 1): Met OT Short Term Goal 3 (Week 1): Pt will don sock and shoe on R foot.  OT Short Term Goal 3 - Progress (Week 1): Met OT Short Term Goal 4 (Week 1): Pt will stand at sink to groom with min A. OT Short Term Goal 4 - Progress (Week 1): Met Week 2:  OT Short Term Goal 1 (Week 2): STGs = LTGs   Skilled Therapeutic Interventions/Progress Updates:      Pt seen for BADL retraining of toileting, bathing at shower level, and dressing with a focus on dynamic balance, safety awareness, foot positioning. Pt had no LOB today, but did need safety reminders several times to widen the stance of his feet to improve his standing stability and to make sure he was close enough to his sitting surface prior to sitting. Pt was agreeable to donning his R foot AFO with set up A. Overall he requires supervision with his self care.  Pt completed all of his self care and then ambulated to gym to work on RLE AROM with various stair stepping exercises. Pt needed a short rest break and then ambulated back to his room. Pt stated he understands that he needs S and that is why he need to continue his rehab in a SNF.  Pt resting in chair with call light in reach and quick release belt on.  Therapy Documentation Precautions:  Precautions Precautions: Fall Precaution Comments: multiple falls at home; stated he falls up or down entry steps at home about once per month, due to "not knowing where his feet are" Restrictions Weight Bearing  Restrictions: No    Vital Signs: Therapy Vitals Pulse Rate: 82 BP: 134/76 mmHg Pain: Pain Assessment Pain Assessment: No/denies pain ADL: ADL ADL Comments: Refer to FIM  See FIM for current functional status  Therapy/Group: Individual Therapy  Lyrah Bradt 12/04/2014, 11:33 AM

## 2014-12-04 NOTE — Plan of Care (Signed)
Problem: RH Car Transfers Goal: LTG Patient will perform car transfers with assist (PT) LTG: Patient will perform car transfers with assistance (PT).  Outcome: Completed/Met Date Met:  12/04/14  Problem: RH Floor Transfers Goal: LTG Patient will perform floor transfers w/assist (PT) LTG: Patient will perform floor transfers with assistance (PT).  Outcome: Not Met (add Reason) Pt to DC to SNF, will receive more therapy at that point. Not safe to attempt floor transfer at this time.  Kennieth Rad, PT, DPT

## 2014-12-04 NOTE — Progress Notes (Signed)
Occupational Therapy Discharge Summary  Patient Details  Name: Chad Fuller MRN: [id number] Date of Birth: [birth year]    Patient has met 6 of 6 long term goals  due to improved activity tolerance, improved balance, postural control, ability to compensate for deficits, functional use of  RIGHT upper and RIGHT lower extremity, improved attention, improved awareness and improved coordination.  Patient to discharge at overall Supervision level.  Patient's care partner unavailable to provide the necessary physical and cognitive assistance at discharge.    Reasons goals not met: n/a.  Recommendation:  Patient will benefit from ongoing skilled OT services in skilled nursing facility setting to continue to advance functional skills in the area of BADL and iADL.  Equipment: No equipment provided  Reasons for discharge: treatment goals met  Patient/family agrees with progress made and goals achieved: Yes  OT Discharge Precautions/Restrictions  Precautions Precautions: Fall Precaution Comments: multiple falls at home; stated he falls up or down entry steps at home about once per month, due to "not knowing where his feet are" Restrictions Weight Bearing Restrictions: No    Pain Assessment Pain Assessment: No/denies pain ADL ADL ADL Comments: Close Supervison overall Vision/Perception  Vision- History Patient Visual Report: No change from baseline Vision- Assessment Eye Alignment: Within Functional Limits Ocular Range of Motion: Within Functional Limits Perception Comments: WFL Praxis Praxis: Intact  Cognition Overall Cognitive Status: Impaired/Different from baseline Orientation Level: Oriented X4 Memory: Impaired Memory Impairment: Decreased recall of new information;Decreased short term memory Awareness: Impaired Awareness Impairment: Emergent impairment Safety/Judgment: Impaired Comments: Pt does not heed safety warnings, "I'm not gonna  fall." Sensation Sensation Light Touch: Impaired Detail Light Touch Impaired Details: Absent RLE;Impaired LLE (absent R great toe ) Stereognosis: Appears Intact Hot/Cold: Appears Intact Proprioception: Appears Intact Coordination Gross Motor Movements are Fluid and Coordinated: Yes Fine Motor Movements are Fluid and Coordinated: No Coordination and Movement Description: [place] functional in RUE for basic self care as pt is able to tie his shoes and fasten buttons.  Motor  Motor Motor: Hemiplegia Motor - Discharge Observations: Improved trunk control, ataxia, and gross motor control. [place] still requires increased time and with decreased accuracy Mobility  Bed Mobility Bed Mobility: Supine to Sit;Sit to Supine Supine to Sit: 6: Modified independent (Device/Increase time) Sit to Supine: 6: Modified independent (Device/Increase time)  Supervision with ADL transfers with RW Trunk/Postural Assessment  Cervical Assessment Cervical Assessment: Exceptions to [facility] (forward head) Thoracic Assessment Thoracic Assessment: Exceptions to [facility] (kyphotic) Lumbar Assessment Lumbar Assessment: Exceptions to [facility] (Forward flexed) Postural Control Postural Control: Deficits on evaluation Trunk Control: leans L with poor awareness Righting Reactions: dealyed but improved since eval, still relies on UEs Postural Limitations: No back pain with unsupported sitting  Balance Balance Balance Assessed: Yes Standardized Balance Assessment Standardized Balance Assessment: Berg Balance Test Berg Balance Test Sit to Stand: Able to stand  independently using hands Standing Unsupported: Able to stand 2 minutes with supervision Sitting with Back Unsupported but Feet Supported on Floor or Stool: Able to sit safely and securely 2 minutes Stand to Sit: Controls descent by using hands Transfers: Able to transfer with verbal cueing and /or supervision Standing Unsupported with Eyes Closed: Able to stand 10 seconds with  supervision Standing Ubsupported with Feet Together: Able to place feet together independently and stand for 1 minute with supervision From Standing, Reach Forward with Outstretched Arm: Can reach confidently >25 cm (10") From Standing Position, Pick up Object from Floor: Able to pick up shoe, needs supervision From  Standing Position, Turn to Look Behind Over each Shoulder: Needs assist to keep from losing balance and falling Turn 360 Degrees: Able to turn 360 degrees safely but slowly Standing Unsupported, Alternately Place Feet on Step/Stool: Able to complete >2 steps/needs minimal assist Standing Unsupported, One Foot in Front: Able to take small step independently and hold 30 seconds Standing on One Leg: Tries to lift leg/unable to hold 3 seconds but remains standing independently Total Score: 34 Dynamic Sitting Balance Dynamic Sitting - Level of Assistance: 5: Stand by assistance Sitting balance - Comments: Trunk control improved today, able to reach outside BoS without LOB.  Static Standing Balance Static Standing - Level of Assistance: 5: Stand by assistance Dynamic Standing Balance Dynamic Standing - Level of Assistance: 5: Stand by assistance Extremity/Trunk Assessment RUE Assessment RUE Assessment: Within Functional Limits LUE Assessment LUE Assessment: Within Functional Limits  See FIM for current functional status  SAGUIER,JULIA 12/04/2014, 11:42 AM

## 2014-12-04 NOTE — Progress Notes (Signed)
Speech Language Pathology Discharge Summary  Patient Details  Name: Chad Fuller MRN: [id number] Date of Birth: Sep 07, 1926  Today's Date: 12/04/2014 SLP Individual Time: 5329-9242 SLP Individual Time Calculation (min): 60 min   Skilled Therapeutic Interventions:  Pt was seen for skilled ST targeting swallowing and self care goals.  Upon arrival, pt was seated upright in wheelchair, awake, alert, and agreeable to participate in [place].  SLP provided supervision question cues to facilitate recall of the parameters of the water protocol prior to initiation of water trials.  SLP completed skilled observations during the abovementioned trials with pt exhibiting no overt s/s of aspiration at bedside.  SLP reviewed and reinforced rationale behind pt's currently recommended diet, including risk of silent aspiration s/p pontine CVA and per results of most recent FEES.  SLP also administered the MoCA standardized assessment of cognitive function to assess progress made while inpatient.  Pt scored 24/30 (n=26/30) which, while slightly below what is considered to be within normal limits, SLP suspects is at/near pt's cognitive baseline. During a functional problem solving task targeting thought organization and mental flexibility pt required overall min assist and extra processing time to complete for >80% accuracy.  Pt also recalled 2/3 dysarthria strategies from previous therapy sessions to improve speech intelligibility across contexts with both skilled and unskilled communication partners.  Pt is on track for discharge tomorrow.     Patient has met 6 of 6 long term goals.  Patient to discharge at [facility] level.  Reasons goals not met: n/a   Clinical Impression/Discharge Summary:  Pt made functional gains while inpatient and is discharging having met 6 out of 6 short term goals.  Pt currently requires overall min assist for semi-complex cognitive tasks and utilizes his recommended swallowing precautions  with supervision cues to minimize overt s/s of aspiration with dys 3 solids and nectar thick liquids via teaspoon.  Pt with silent aspiration of nectar thick liquids via cup sips on most recent FEES; however, he is tolerating trials of regular water via teaspoon per the water protocol.  Recommend that pt continue on the water protocol at next level of care to work towards liquids advancement.  Pt would also benefit from ongoing education for dysphagia goals at next level of care as he demonstrates poor compliance with recommended swallowing precautions.  Suspect that pt is near his cognitive baseline, although family has not been present to verify.    Care Partner:  Caregiver Able to Provide Assistance:  (SNF)     Recommendation:  24 hour supervision/assistance;Skilled Nursing facility  Rationale for SLP Follow Up: Maximize swallowing safety;Maximize cognitive function and independence;Reduce caregiver burden   Equipment: thickener    Reasons for discharge: Discharged from hospital   Patient/Family Agrees with Progress Made and Goals Achieved: Yes   See FIM for current functional status  [name] 12/04/2014, 6:59 PM

## 2014-12-04 NOTE — Plan of Care (Signed)
Problem: RH Balance Goal: LTG Patient will maintain dynamic sitting balance (PT) LTG: Patient will maintain dynamic sitting balance with assistance during mobility activities (PT)  Outcome: Completed/Met Date Met:  12/04/14 Goal: LTG Patient will maintain dynamic standing balance (PT) LTG: Patient will maintain dynamic standing balance with assistance during mobility activities (PT)  Outcome: Completed/Met Date Met:  12/04/14  Problem: RH Bed Mobility Goal: LTG Patient will perform bed mobility with assist (PT) LTG: Patient will perform bed mobility with assistance, with/without cues (PT).  Outcome: Completed/Met Date Met:  12/04/14  Problem: RH Bed to Chair Transfers Goal: LTG Patient will perform bed/chair transfers w/assist (PT) LTG: Patient will perform bed/chair transfers with assistance, with/without cues (PT).  Outcome: Completed/Met Date Met:  12/04/14  Problem: RH Furniture Transfers Goal: LTG Patient will perform furniture transfers w/assist (OT/PT LTG: Patient will perform furniture transfers with assistance (OT/PT).  Outcome: Completed/Met Date Met:  12/04/14  Problem: RH Ambulation Goal: LTG Patient will ambulate in controlled environment (PT) LTG: Patient will ambulate in a controlled environment, # of feet with assistance (PT).  Outcome: Completed/Met Date Met:  12/04/14 Goal: LTG Patient will ambulate in home environment (PT) LTG: Patient will ambulate in home environment, # of feet with assistance (PT).  Outcome: Completed/Met Date Met:  12/04/14  Problem: RH Wheelchair Mobility Goal: LTG Patient will propel w/c in controlled environment (PT) LTG: Patient will propel wheelchair in controlled environment, # of feet with assist (PT)  Outcome: Completed/Met Date Met:  12/04/14 Goal: LTG Patient will propel w/c in home environment (PT) LTG: Patient will propel wheelchair in home environment, # of feet with assistance (PT).  Outcome: Completed/Met Date Met:   12/04/14  Problem: RH Stairs Goal: LTG Patient will ambulate up and down stairs w/assist (PT) LTG: Patient will ambulate up and down # of stairs with assistance (PT)  Outcome: Completed/Met Date Met:  12/04/14  Problem: RH Memory Goal: LTG Patient demonstrate ability for day to day recall (PT) LTG: Patient will demonstrate ability for day to day recall/carryover during mobility activities with assist (PT)  Outcome: Completed/Met Date Met:  12/04/14

## 2014-12-04 NOTE — Plan of Care (Signed)
Problem: RH Swallowing Goal: LTG Patient will consume least restrictive PO diet (SLP) LTG: Patient will consume least restrictive PO diet with assist for use of compensatory strategies (SLP)  Outcome: Completed/Met Date Met:  12/04/14 Goal: LTG Patient will participate in dysphagia therapy (SLP) LTG: Patient will participate in dysphagia therapy with assist to increase swallow function as evidenced by bedside or objective clinical assessment (SLP)  Outcome: Completed/Met Date Met:  12/04/14 Goal: LTG Patient will demonstrate a functional change in (SLP) LTG: Patient will demonstrate a functional change in oral/oropharyngeal swallow as evidenced by an objective assessment (SLP)  Outcome: Completed/Met Date Met:  12/04/14  Problem: RH Problem Solving Goal: LTG Patient will demonstrate problem solving for (SLP) LTG: Patient will demonstrate problem solving for basic/complex daily situations with cues (SLP)  Outcome: Completed/Met Date Met:  12/04/14  Problem: RH Memory Goal: LTG Patient will use memory compensatory aids to (SLP) LTG: Patient will use memory compensatory aids to recall biographical/new, daily complex information with cues (SLP)  Outcome: Completed/Met Date Met:  12/04/14  Problem: RH Awareness Goal: LTG: Patient will demonstrate intellectual/emergent (SLP) LTG: Patient will demonstrate intellectual/emergent/anticipatory awareness with assist during a cognitive/linguistic activity (SLP)  Outcome: Completed/Met Date Met:  12/04/14     

## 2014-12-04 NOTE — Plan of Care (Signed)
Problem: RH Balance Goal: LTG Patient will maintain dynamic standing with ADLs (OT) LTG: Patient will maintain dynamic standing balance with assist during activities of daily living (OT)  Outcome: Completed/Met Date Met:  12/04/14  Problem: RH Bathing Goal: LTG Patient will bathe with assist, cues/equipment (OT) LTG: Patient will bathe specified number of body parts with assist with/without cues using equipment (position) (OT)  Outcome: Completed/Met Date Met:  12/04/14  Problem: RH Dressing Goal: LTG Patient will perform lower body dressing w/assist (OT) LTG: Patient will perform lower body dressing with assist, with/without cues in positioning using equipment (OT)  Outcome: Completed/Met Date Met:  12/04/14  Problem: RH Toileting Goal: LTG Patient will perform toileting w/assist, cues/equip (OT) LTG: Patient will perform toiletiing (clothes management/hygiene) with assist, with/without cues using equipment (OT)  Outcome: Completed/Met Date Met:  12/04/14  Problem: RH Toilet Transfers Goal: LTG Patient will perform toilet transfers w/assist (OT) LTG: Patient will perform toilet transfers with assist, with/without cues using equipment (OT)  Outcome: Completed/Met Date Met:  12/04/14  Problem: RH Tub/Shower Transfers Goal: LTG Patient will perform tub/shower transfers w/assist (OT) LTG: Patient will perform tub/shower transfers with assist, with/without cues using equipment (OT)  Outcome: Completed/Met Date Met:  12/04/14

## 2014-12-04 NOTE — Plan of Care (Signed)
Problem: RH BOWEL ELIMINATION Goal: RH STG MANAGE BOWEL W/MEDICATION W/ASSISTANCE STG Manage Bowel with Medication with min Assistance.  Outcome: Progressing  Problem: RH SKIN INTEGRITY Goal: RH STG SKIN FREE OF INFECTION/BREAKDOWN Skin to remain free from infection/breakdown while on rehab with min assist  Outcome: Progressing  Problem: RH SAFETY Goal: RH STG ADHERE TO SAFETY PRECAUTIONS W/ASSISTANCE/DEVICE STG Adhere to Safety Precautions With Assistance/Device. Supervision  Outcome: Progressing  Problem: RH PAIN MANAGEMENT Goal: RH STG PAIN MANAGED AT OR BELOW PT'S PAIN GOAL <4  Outcome: Progressing  Problem: RH KNOWLEDGE DEFICIT Goal: RH STG INCREASE KNOWLEDGE OF DIABETES Patient will state s/s of hypo/hyperglycemia, dietary and medication management with mod assist  Outcome: Progressing

## 2014-12-04 NOTE — Plan of Care (Signed)
Problem: RH SKIN INTEGRITY Goal: RH STG SKIN FREE OF INFECTION/BREAKDOWN Skin to remain free from infection/breakdown while on rehab with min assist  Outcome: Completed/Met Date Met:  12/04/14

## 2014-12-04 NOTE — Plan of Care (Signed)
Problem: RH PAIN MANAGEMENT Goal: RH STG PAIN MANAGED AT OR BELOW PT'S PAIN GOAL <4  Outcome: Completed/Met Date Met:  12/04/14 No c/o pain

## 2014-12-05 DIAGNOSIS — I6350 Cerebral infarction due to unspecified occlusion or stenosis of unspecified cerebral artery: Secondary | ICD-10-CM | POA: Diagnosis not present

## 2014-12-05 DIAGNOSIS — I639 Cerebral infarction, unspecified: Secondary | ICD-10-CM | POA: Diagnosis not present

## 2014-12-05 DIAGNOSIS — E1140 Type 2 diabetes mellitus with diabetic neuropathy, unspecified: Secondary | ICD-10-CM | POA: Diagnosis not present

## 2014-12-05 DIAGNOSIS — I69351 Hemiplegia and hemiparesis following cerebral infarction affecting right dominant side: Secondary | ICD-10-CM | POA: Diagnosis not present

## 2014-12-05 DIAGNOSIS — R262 Difficulty in walking, not elsewhere classified: Secondary | ICD-10-CM | POA: Diagnosis not present

## 2014-12-05 DIAGNOSIS — R1310 Dysphagia, unspecified: Secondary | ICD-10-CM | POA: Diagnosis not present

## 2014-12-05 DIAGNOSIS — I2510 Atherosclerotic heart disease of native coronary artery without angina pectoris: Secondary | ICD-10-CM | POA: Diagnosis not present

## 2014-12-05 DIAGNOSIS — I10 Essential (primary) hypertension: Secondary | ICD-10-CM | POA: Diagnosis not present

## 2014-12-05 DIAGNOSIS — R509 Fever, unspecified: Secondary | ICD-10-CM | POA: Diagnosis not present

## 2014-12-05 DIAGNOSIS — R2689 Other abnormalities of gait and mobility: Secondary | ICD-10-CM | POA: Diagnosis not present

## 2014-12-05 DIAGNOSIS — Z9181 History of falling: Secondary | ICD-10-CM | POA: Diagnosis not present

## 2014-12-05 DIAGNOSIS — I69391 Dysphagia following cerebral infarction: Secondary | ICD-10-CM | POA: Diagnosis not present

## 2014-12-05 DIAGNOSIS — I69322 Dysarthria following cerebral infarction: Secondary | ICD-10-CM | POA: Diagnosis not present

## 2014-12-05 DIAGNOSIS — G8190 Hemiplegia, unspecified affecting unspecified side: Secondary | ICD-10-CM | POA: Diagnosis not present

## 2014-12-05 DIAGNOSIS — E119 Type 2 diabetes mellitus without complications: Secondary | ICD-10-CM | POA: Diagnosis not present

## 2014-12-05 DIAGNOSIS — F332 Major depressive disorder, recurrent severe without psychotic features: Secondary | ICD-10-CM | POA: Diagnosis not present

## 2014-12-05 DIAGNOSIS — I69991 Dysphagia following unspecified cerebrovascular disease: Secondary | ICD-10-CM | POA: Diagnosis not present

## 2014-12-05 DIAGNOSIS — I69951 Hemiplegia and hemiparesis following unspecified cerebrovascular disease affecting right dominant side: Secondary | ICD-10-CM | POA: Diagnosis not present

## 2014-12-05 DIAGNOSIS — R05 Cough: Secondary | ICD-10-CM | POA: Diagnosis not present

## 2014-12-05 DIAGNOSIS — E11649 Type 2 diabetes mellitus with hypoglycemia without coma: Secondary | ICD-10-CM | POA: Diagnosis not present

## 2014-12-05 LAB — GLUCOSE, CAPILLARY
GLUCOSE-CAPILLARY: 148 mg/dL — AB (ref 70–99)
Glucose-Capillary: 129 mg/dL — ABNORMAL HIGH (ref 70–99)

## 2014-12-05 NOTE — Progress Notes (Signed)
80 year old male with history of DM type 2 with neuropathy, PE, NSCLC s/p resection, CAD who was admitted on 11/18/14 with slurred speech, dizziness and difficulty walking with fall. CT head without acute changes, generalized atrophy and right forehead contusion. CT cervical spine with stenosis C3/4 to C 5/6. MRI/MRA brain with Small acute brainstem infarct in the left paracentral pons and no significant stenosis.  2D echo with EF 60%, AV sclerosis and mitral annular calcification without stenosis. Carotid dopplers without significant stenosis. Neurology recommended ASA for thrombotic stroke due to SVD. Speech therapy evaluation with dysphagia and FEES done  Subjective/Complaints: Pt without new issues Review of Systems - neg except for above Objective: Vital Signs: Blood pressure 128/68, pulse 86, temperature 97.7 F (36.5 C), temperature source Oral, resp. rate 18, height 6' 2" (1.88 m), weight 100.562 kg (221 lb 11.2 oz), SpO2 98 %. No results found. Results for orders placed or performed during the hospital encounter of 11/20/14 (from the past 72 hour(s))  Glucose, capillary     Status: Abnormal   Collection Time: 12/02/14 11:41 AM  Result Value Ref Range   Glucose-Capillary 259 (H) 70 - 99 mg/dL  Glucose, capillary     Status: Abnormal   Collection Time: 12/02/14  4:39 PM  Result Value Ref Range   Glucose-Capillary 213 (H) 70 - 99 mg/dL  Glucose, capillary     Status: Abnormal   Collection Time: 12/02/14  9:58 PM  Result Value Ref Range   Glucose-Capillary 173 (H) 70 - 99 mg/dL  Glucose, capillary     Status: None   Collection Time: 12/03/14  3:09 AM  Result Value Ref Range   Glucose-Capillary 96 70 - 99 mg/dL  Glucose, capillary     Status: None   Collection Time: 12/03/14  6:42 AM  Result Value Ref Range   Glucose-Capillary 89 70 - 99 mg/dL  Glucose, capillary     Status: Abnormal   Collection Time: 12/03/14 11:59 AM  Result Value Ref Range   Glucose-Capillary 179 (H) 70 - 99  mg/dL   Comment 1 Notify RN   Glucose, capillary     Status: Abnormal   Collection Time: 12/03/14  5:11 PM  Result Value Ref Range   Glucose-Capillary 221 (H) 70 - 99 mg/dL   Comment 1 Notify RN   Glucose, capillary     Status: Abnormal   Collection Time: 12/03/14  8:54 PM  Result Value Ref Range   Glucose-Capillary 226 (H) 70 - 99 mg/dL  Glucose, capillary     Status: Abnormal   Collection Time: 12/04/14  2:58 AM  Result Value Ref Range   Glucose-Capillary 110 (H) 70 - 99 mg/dL  Creatinine, serum     Status: Abnormal   Collection Time: 12/04/14  4:02 AM  Result Value Ref Range   Creatinine, Ser 0.63 0.50 - 1.35 mg/dL   GFR calc non Af Amer 87 (L) >90 mL/min   GFR calc Af Amer >90 >90 mL/min    Comment: (NOTE) The eGFR has been calculated using the CKD EPI equation. This calculation has not been validated in all clinical situations. eGFR's persistently <90 mL/min signify possible Chronic Kidney Disease.   Basic metabolic panel     Status: Abnormal   Collection Time: 12/04/14  4:02 AM  Result Value Ref Range   Sodium 138 137 - 147 mEq/L   Potassium 4.5 3.7 - 5.3 mEq/L   Chloride 100 96 - 112 mEq/L   CO2 26 19 -  32 mEq/L   Glucose, Bld 98 70 - 99 mg/dL   BUN 15 6 - 23 mg/dL   Creatinine, Ser 0.66 0.50 - 1.35 mg/dL   Calcium 8.9 8.4 - 10.5 mg/dL   GFR calc non Af Amer 85 (L) >90 mL/min   GFR calc Af Amer >90 >90 mL/min    Comment: (NOTE) The eGFR has been calculated using the CKD EPI equation. This calculation has not been validated in all clinical situations. eGFR's persistently <90 mL/min signify possible Chronic Kidney Disease.    Anion gap 12 5 - 15  Glucose, capillary     Status: Abnormal   Collection Time: 12/04/14  5:20 AM  Result Value Ref Range   Glucose-Capillary 104 (H) 70 - 99 mg/dL  Glucose, capillary     Status: None   Collection Time: 12/04/14  6:37 AM  Result Value Ref Range   Glucose-Capillary 98 70 - 99 mg/dL  Glucose, capillary     Status:  Abnormal   Collection Time: 12/04/14 11:44 AM  Result Value Ref Range   Glucose-Capillary 127 (H) 70 - 99 mg/dL  Glucose, capillary     Status: Abnormal   Collection Time: 12/04/14  4:40 PM  Result Value Ref Range   Glucose-Capillary 215 (H) 70 - 99 mg/dL  Glucose, capillary     Status: Abnormal   Collection Time: 12/04/14  9:27 PM  Result Value Ref Range   Glucose-Capillary 221 (H) 70 - 99 mg/dL   Comment 1 Notify RN   Glucose, capillary     Status: Abnormal   Collection Time: 12/05/14  3:22 AM  Result Value Ref Range   Glucose-Capillary 148 (H) 70 - 99 mg/dL   Comment 1 Notify RN   Glucose, capillary     Status: Abnormal   Collection Time: 12/05/14  6:44 AM  Result Value Ref Range   Glucose-Capillary 129 (H) 70 - 99 mg/dL     HEENT: small cyst at jct between auricle and lateral scalp on right Cardio: RRR and no murmurs Resp: CTA B/L and unlabored GI: BS positive and NT,ND Extremity:  Pulses positive and No Edema Skin:   Intact .sm cyst behind R ear Neuro: Alert/Oriented, Abnormal Sensory decreased LT sensation bilateral feet, Abnormal Motor 3- R delt, bi, tri, grip, HF, KE, ADF and Abnormal FMC Ataxic/ dec FMC, speech dysarthric, right facial droop. Impulsive, decreased insight and awareness Musc/Skel:  No pain with UE ROM Gen NAD   Assessment/Plan: 1. 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 Stable for D/C today to SNF F/u PCP in 1-2 weeks F/u PM&R 3 weeks See D/C summary See D/C instructions  FIM: FIM - Bathing Bathing Steps Patient Completed: Chest, Right Arm, Left Arm, Abdomen, Left upper leg, Right upper leg, Buttocks, Front perineal area, Left lower leg (including foot), Right lower leg (including foot) Bathing: 5: Supervision: Safety issues/verbal cues  FIM - Upper Body Dressing/Undressing Upper body dressing/undressing steps patient completed: Thread/unthread right sleeve of pullover shirt/dresss, Thread/unthread  left sleeve of pullover shirt/dress, Put head through opening of pull over shirt/dress, Pull shirt over trunk, Thread/unthread right sleeve of front closure shirt/dress, Thread/unthread left sleeve of front closure shirt/dress, Pull shirt around back of front closure shirt/dress, Button/unbutton shirt Upper body dressing/undressing: 5: Set-up assist to: Obtain clothing/put away FIM - Lower Body Dressing/Undressing Lower body dressing/undressing steps patient completed: Thread/unthread left pants leg, Pull pants up/down, Don/Doff left sock, Don/Doff left shoe, Fasten/unfasten left shoe, Thread/unthread right pants leg,  Don/Doff right shoe, Don/Doff right sock, Fasten/unfasten right shoe Lower body dressing/undressing: 5: Set-up assist to: Don/Doff AFO/prosthesis/orthosis  FIM - Toileting Toileting steps completed by patient: Adjust clothing prior to toileting, Performs perineal hygiene, Adjust clothing after toileting Toileting Assistive Devices: Grab bar or rail for support Toileting: 5: Supervision: Safety issues/verbal cues  FIM - Radio producer Devices: Grab bars, Insurance account manager Transfers: 5-To toilet/BSC: Supervision (verbal cues/safety issues), 5-From toilet/BSC: Supervision (verbal cues/safety issues)  FIM - Control and instrumentation engineer Devices: Arm rests, Copy: 6: Supine > Sit: No assist, 6: Sit > Supine: No assist, 5: Bed > Chair or W/C: Supervision (verbal cues/safety issues), 5: Chair or W/C > Bed: Supervision (verbal cues/safety issues)  FIM - Locomotion: Wheelchair Distance: 150 Locomotion: Wheelchair: 5: Travels 150 ft or more: maneuvers on rugs and over door sills with supervision, cueing or coaxing FIM - Locomotion: Ambulation Locomotion: Ambulation Assistive Devices: Administrator Ambulation/Gait Assistance: 5: Supervision, 4: Min guard Locomotion: Ambulation: 4: Travels 150 ft or more with minimal  assistance (Pt.>75%)  Comprehension Comprehension Mode: Auditory Comprehension: 5-Follows basic conversation/direction: With extra time/assistive device  Expression Expression Mode: Verbal Expression: 5-Expresses basic needs/ideas: With extra time/assistive device  Social Interaction Social Interaction: 5-Interacts appropriately 90% of the time - Needs monitoring or encouragement for participation or interaction.  Problem Solving Problem Solving Mode: Not assessed Problem Solving: 4-Solves basic 75 - 89% of the time/requires cueing 10 - 24% of the time  Memory Memory: 4-Recognizes or recalls 75 - 89% of the time/requires cueing 10 - 24% of the time  Medical Problem List and Plan: 1. Functional deficits secondary to Left paracentral pontine stroke 2.  DVT Prophylaxis/Anticoagulation: Pharmaceutical: Lovenox 3. Pain Management: Tylenol prn for back pain. Will add K pad additionally.   4. Mood: LCSW to follow for evaluation and support.   5. Neuropsych: This patient is capable of making decisions on his own behalf.  -waist belt in wheelchair, up at nurses station prn for safety--continue 6. Skin/Wound Care: Routine pressure relief measures.   -normal hygiene to left great toe is all that's required 7. Fluids/Electrolytes/Nutrition: Monitor I/O. Offer nutritional supplement prn if intake poor.IVF   8. DM type 2 with neuropathy: Will monitor BS with ac/hs checks. reduce lantus to 50 units and resumed metformin  -continue 850 metformin in am and decreased pm dose to 563m--improved, no am hypoglycemia, some elevation around noon increase am metformin 9.  BPH: Continue proscar. Monitor for any voiding difficulty.    10.  HTN: Will monitor blood pressure tid. .  intake improving, BPs  Controlled                                                                 11. GERD:  added Pepcid for symptoms.     LOS (Days) 15 A FACE TO FACE EVALUATION WAS PERFORMED  KIRSTEINS,ANDREW E 12/05/2014,  9:06 AM

## 2014-12-05 NOTE — Progress Notes (Signed)
Social Work Discharge Note Discharge Note  The overall goal for the admission was met for:   Discharge location: [facility] [place]  Length of Stay: Yes-15 DAYS  Discharge activity level: Yes-SUPERVISION/MIN LEVEL  Home/community participation: Yes  Services provided included: MD, RD, PT, OT, SLP, RN, CM, TR, Pharmacy and [place]: Medicare and Private Insurance: [facility]  Follow-up services arranged: Other: NHP  Comments (or additional information):DAUGHTER'S FELT THEY CAN NOT PROVIDE 24 HR CARE NEED HIM HIGHER LEVEL TO GO HOME. PT AGREEABLE TO THE PLAN .  DAUGHTER HAS BEEN TRAINED ON CAR TRANFERS  Patient/Family verbalized understanding of follow-up arrangements: Yes  Individual responsible for coordination of the follow-up plan: [facility]  Confirmed correct DME delivered: Elease Hashimoto 12/05/2014    Elease Hashimoto

## 2014-12-05 NOTE — Progress Notes (Signed)
Patient discharge information given to patient and caregiver, questions answered.  Report called to [facility] at [employer]  Patient discharged via wheelchair to [facility] by family member.

## 2014-12-05 NOTE — Plan of Care (Signed)
Problem: RH KNOWLEDGE DEFICIT Goal: RH STG INCREASE KNOWLEDGE OF DIABETES Patient will state s/s of hypo/hyperglycemia, dietary and medication management with mod assist  Outcome: Completed/Met Date Met:  12/05/14

## 2014-12-06 DIAGNOSIS — I10 Essential (primary) hypertension: Secondary | ICD-10-CM | POA: Diagnosis not present

## 2014-12-06 DIAGNOSIS — I639 Cerebral infarction, unspecified: Secondary | ICD-10-CM | POA: Diagnosis not present

## 2014-12-06 DIAGNOSIS — E119 Type 2 diabetes mellitus without complications: Secondary | ICD-10-CM | POA: Diagnosis not present

## 2014-12-06 DIAGNOSIS — F332 Major depressive disorder, recurrent severe without psychotic features: Secondary | ICD-10-CM | POA: Diagnosis not present

## 2014-12-06 NOTE — Plan of Care (Signed)
Problem: RH Leisure Awareness Goal: LTG: Patient will participate in leisure activities (TR) LTG: Patient will participate in leisure activities (simple/moderate/difficult) to increase ability to functionally perform activity, identify and utilize resources, identify new leisure interests, utilize adaptive equipment at specific level (TR)  Outcome: Completed/Met Date Met:  12/06/14

## 2014-12-06 NOTE — Progress Notes (Signed)
Recreational Therapy Discharge Summary Patient Details  Name: CALIBER LANDESS MRN: [id number] Date of Birth: December 27, 1926 Today's Date: 12/06/2014  Long term goals set: 1  Long term goals met: 1  Comments on progress toward goals: Pt has made good progress during LOS meeting supervision level for simple TR tasks seated or standing.  Pt does require set up assist & verbal cues for safety.  Pt requires min assist for balance during moderately complex standing tasks.  Pt is discharging to SNF today for 24 hour supervision and continued therapies. Reasons for discharge: discharge from hospital Patient/family agrees with progress made and goals achieved: Yes  Auren Valdes 12/06/2014, 8:27 AM

## 2014-12-15 DIAGNOSIS — F332 Major depressive disorder, recurrent severe without psychotic features: Secondary | ICD-10-CM | POA: Diagnosis not present

## 2014-12-15 DIAGNOSIS — E119 Type 2 diabetes mellitus without complications: Secondary | ICD-10-CM | POA: Diagnosis not present

## 2014-12-15 DIAGNOSIS — I639 Cerebral infarction, unspecified: Secondary | ICD-10-CM | POA: Diagnosis not present

## 2014-12-19 ENCOUNTER — Ambulatory Visit: Payer: Medicare Other | Admitting: Podiatrist

## 2014-12-23 DIAGNOSIS — I639 Cerebral infarction, unspecified: Secondary | ICD-10-CM | POA: Diagnosis not present

## 2014-12-23 DIAGNOSIS — I2510 Atherosclerotic heart disease of native coronary artery without angina pectoris: Secondary | ICD-10-CM | POA: Diagnosis not present

## 2014-12-23 DIAGNOSIS — R2689 Other abnormalities of gait and mobility: Secondary | ICD-10-CM | POA: Diagnosis not present

## 2014-12-23 DIAGNOSIS — R509 Fever, unspecified: Secondary | ICD-10-CM | POA: Diagnosis not present

## 2015-01-03 DIAGNOSIS — I69991 Dysphagia following unspecified cerebrovascular disease: Secondary | ICD-10-CM | POA: Diagnosis not present

## 2015-01-03 DIAGNOSIS — I69951 Hemiplegia and hemiparesis following unspecified cerebrovascular disease affecting right dominant side: Secondary | ICD-10-CM | POA: Diagnosis not present

## 2015-01-03 DIAGNOSIS — E119 Type 2 diabetes mellitus without complications: Secondary | ICD-10-CM | POA: Diagnosis not present

## 2015-01-03 DIAGNOSIS — I69993 Ataxia following unspecified cerebrovascular disease: Secondary | ICD-10-CM | POA: Diagnosis not present

## 2015-01-03 DIAGNOSIS — F329 Major depressive disorder, single episode, unspecified: Secondary | ICD-10-CM | POA: Diagnosis not present

## 2015-01-03 DIAGNOSIS — I2510 Atherosclerotic heart disease of native coronary artery without angina pectoris: Secondary | ICD-10-CM | POA: Diagnosis not present

## 2015-01-03 DIAGNOSIS — Z794 Long term (current) use of insulin: Secondary | ICD-10-CM | POA: Diagnosis not present

## 2015-01-03 DIAGNOSIS — I10 Essential (primary) hypertension: Secondary | ICD-10-CM | POA: Diagnosis not present

## 2015-01-04 DIAGNOSIS — I69951 Hemiplegia and hemiparesis following unspecified cerebrovascular disease affecting right dominant side: Secondary | ICD-10-CM | POA: Diagnosis not present

## 2015-01-04 DIAGNOSIS — I69991 Dysphagia following unspecified cerebrovascular disease: Secondary | ICD-10-CM | POA: Diagnosis not present

## 2015-01-04 DIAGNOSIS — I2510 Atherosclerotic heart disease of native coronary artery without angina pectoris: Secondary | ICD-10-CM | POA: Diagnosis not present

## 2015-01-04 DIAGNOSIS — I69993 Ataxia following unspecified cerebrovascular disease: Secondary | ICD-10-CM | POA: Diagnosis not present

## 2015-01-04 DIAGNOSIS — I10 Essential (primary) hypertension: Secondary | ICD-10-CM | POA: Diagnosis not present

## 2015-01-04 DIAGNOSIS — E119 Type 2 diabetes mellitus without complications: Secondary | ICD-10-CM | POA: Diagnosis not present

## 2015-01-07 DIAGNOSIS — E119 Type 2 diabetes mellitus without complications: Secondary | ICD-10-CM | POA: Diagnosis not present

## 2015-01-07 DIAGNOSIS — I69991 Dysphagia following unspecified cerebrovascular disease: Secondary | ICD-10-CM | POA: Diagnosis not present

## 2015-01-07 DIAGNOSIS — I2510 Atherosclerotic heart disease of native coronary artery without angina pectoris: Secondary | ICD-10-CM | POA: Diagnosis not present

## 2015-01-07 DIAGNOSIS — I69951 Hemiplegia and hemiparesis following unspecified cerebrovascular disease affecting right dominant side: Secondary | ICD-10-CM | POA: Diagnosis not present

## 2015-01-07 DIAGNOSIS — I69993 Ataxia following unspecified cerebrovascular disease: Secondary | ICD-10-CM | POA: Diagnosis not present

## 2015-01-07 DIAGNOSIS — I10 Essential (primary) hypertension: Secondary | ICD-10-CM | POA: Diagnosis not present

## 2015-01-08 DIAGNOSIS — I69991 Dysphagia following unspecified cerebrovascular disease: Secondary | ICD-10-CM | POA: Diagnosis not present

## 2015-01-08 DIAGNOSIS — I69993 Ataxia following unspecified cerebrovascular disease: Secondary | ICD-10-CM | POA: Diagnosis not present

## 2015-01-08 DIAGNOSIS — E119 Type 2 diabetes mellitus without complications: Secondary | ICD-10-CM | POA: Diagnosis not present

## 2015-01-08 DIAGNOSIS — I2510 Atherosclerotic heart disease of native coronary artery without angina pectoris: Secondary | ICD-10-CM | POA: Diagnosis not present

## 2015-01-08 DIAGNOSIS — I10 Essential (primary) hypertension: Secondary | ICD-10-CM | POA: Diagnosis not present

## 2015-01-08 DIAGNOSIS — I69951 Hemiplegia and hemiparesis following unspecified cerebrovascular disease affecting right dominant side: Secondary | ICD-10-CM | POA: Diagnosis not present

## 2015-01-10 DIAGNOSIS — I69991 Dysphagia following unspecified cerebrovascular disease: Secondary | ICD-10-CM | POA: Diagnosis not present

## 2015-01-10 DIAGNOSIS — I2510 Atherosclerotic heart disease of native coronary artery without angina pectoris: Secondary | ICD-10-CM | POA: Diagnosis not present

## 2015-01-10 DIAGNOSIS — E119 Type 2 diabetes mellitus without complications: Secondary | ICD-10-CM | POA: Diagnosis not present

## 2015-01-10 DIAGNOSIS — I10 Essential (primary) hypertension: Secondary | ICD-10-CM | POA: Diagnosis not present

## 2015-01-10 DIAGNOSIS — I69951 Hemiplegia and hemiparesis following unspecified cerebrovascular disease affecting right dominant side: Secondary | ICD-10-CM | POA: Diagnosis not present

## 2015-01-10 DIAGNOSIS — I69993 Ataxia following unspecified cerebrovascular disease: Secondary | ICD-10-CM | POA: Diagnosis not present

## 2015-01-14 ENCOUNTER — Ambulatory Visit: Payer: Medicare Other | Admitting: Neurology

## 2015-01-15 DIAGNOSIS — E119 Type 2 diabetes mellitus without complications: Secondary | ICD-10-CM | POA: Diagnosis not present

## 2015-01-15 DIAGNOSIS — I639 Cerebral infarction, unspecified: Secondary | ICD-10-CM | POA: Diagnosis not present

## 2015-01-15 DIAGNOSIS — I10 Essential (primary) hypertension: Secondary | ICD-10-CM | POA: Diagnosis not present

## 2015-01-16 DIAGNOSIS — E119 Type 2 diabetes mellitus without complications: Secondary | ICD-10-CM | POA: Diagnosis not present

## 2015-01-16 DIAGNOSIS — I69991 Dysphagia following unspecified cerebrovascular disease: Secondary | ICD-10-CM | POA: Diagnosis not present

## 2015-01-16 DIAGNOSIS — I10 Essential (primary) hypertension: Secondary | ICD-10-CM | POA: Diagnosis not present

## 2015-01-16 DIAGNOSIS — I2510 Atherosclerotic heart disease of native coronary artery without angina pectoris: Secondary | ICD-10-CM | POA: Diagnosis not present

## 2015-01-16 DIAGNOSIS — I69993 Ataxia following unspecified cerebrovascular disease: Secondary | ICD-10-CM | POA: Diagnosis not present

## 2015-01-16 DIAGNOSIS — I69951 Hemiplegia and hemiparesis following unspecified cerebrovascular disease affecting right dominant side: Secondary | ICD-10-CM | POA: Diagnosis not present

## 2015-01-21 ENCOUNTER — Inpatient Hospital Stay: Payer: Medicare Other | Admitting: Physical Medicine & Rehabilitation

## 2015-01-21 ENCOUNTER — Ambulatory Visit: Payer: Medicare Other

## 2015-01-25 DIAGNOSIS — I2510 Atherosclerotic heart disease of native coronary artery without angina pectoris: Secondary | ICD-10-CM | POA: Diagnosis not present

## 2015-01-25 DIAGNOSIS — I69951 Hemiplegia and hemiparesis following unspecified cerebrovascular disease affecting right dominant side: Secondary | ICD-10-CM | POA: Diagnosis not present

## 2015-01-25 DIAGNOSIS — E119 Type 2 diabetes mellitus without complications: Secondary | ICD-10-CM | POA: Diagnosis not present

## 2015-01-25 DIAGNOSIS — I69991 Dysphagia following unspecified cerebrovascular disease: Secondary | ICD-10-CM | POA: Diagnosis not present

## 2015-01-25 DIAGNOSIS — I10 Essential (primary) hypertension: Secondary | ICD-10-CM | POA: Diagnosis not present

## 2015-01-25 DIAGNOSIS — I69993 Ataxia following unspecified cerebrovascular disease: Secondary | ICD-10-CM | POA: Diagnosis not present

## 2015-02-01 DIAGNOSIS — I2510 Atherosclerotic heart disease of native coronary artery without angina pectoris: Secondary | ICD-10-CM | POA: Diagnosis not present

## 2015-02-01 DIAGNOSIS — E119 Type 2 diabetes mellitus without complications: Secondary | ICD-10-CM | POA: Diagnosis not present

## 2015-02-01 DIAGNOSIS — I10 Essential (primary) hypertension: Secondary | ICD-10-CM | POA: Diagnosis not present

## 2015-02-01 DIAGNOSIS — I69991 Dysphagia following unspecified cerebrovascular disease: Secondary | ICD-10-CM | POA: Diagnosis not present

## 2015-02-01 DIAGNOSIS — I69951 Hemiplegia and hemiparesis following unspecified cerebrovascular disease affecting right dominant side: Secondary | ICD-10-CM | POA: Diagnosis not present

## 2015-02-01 DIAGNOSIS — I69993 Ataxia following unspecified cerebrovascular disease: Secondary | ICD-10-CM | POA: Diagnosis not present

## 2015-02-18 ENCOUNTER — Ambulatory Visit (HOSPITAL_COMMUNITY): Admission: RE | Admit: 2015-02-18 | Payer: Medicare Other | Source: Ambulatory Visit

## 2015-02-21 ENCOUNTER — Ambulatory Visit: Payer: Medicare Other | Admitting: Internal Medicine

## 2015-02-21 ENCOUNTER — Other Ambulatory Visit: Payer: Medicare Other

## 2015-03-04 DIAGNOSIS — J329 Chronic sinusitis, unspecified: Secondary | ICD-10-CM | POA: Diagnosis not present

## 2015-03-04 DIAGNOSIS — J069 Acute upper respiratory infection, unspecified: Secondary | ICD-10-CM | POA: Diagnosis not present

## 2015-04-01 DIAGNOSIS — I10 Essential (primary) hypertension: Secondary | ICD-10-CM | POA: Diagnosis not present

## 2015-04-01 DIAGNOSIS — Z0000 Encounter for general adult medical examination without abnormal findings: Secondary | ICD-10-CM | POA: Diagnosis not present

## 2015-04-01 DIAGNOSIS — F329 Major depressive disorder, single episode, unspecified: Secondary | ICD-10-CM | POA: Diagnosis not present

## 2015-04-01 DIAGNOSIS — I639 Cerebral infarction, unspecified: Secondary | ICD-10-CM | POA: Diagnosis not present

## 2015-04-01 DIAGNOSIS — K219 Gastro-esophageal reflux disease without esophagitis: Secondary | ICD-10-CM | POA: Diagnosis not present

## 2015-04-01 DIAGNOSIS — E780 Pure hypercholesterolemia: Secondary | ICD-10-CM | POA: Diagnosis not present

## 2015-04-01 DIAGNOSIS — J449 Chronic obstructive pulmonary disease, unspecified: Secondary | ICD-10-CM | POA: Diagnosis not present

## 2015-04-01 DIAGNOSIS — Z79899 Other long term (current) drug therapy: Secondary | ICD-10-CM | POA: Diagnosis not present

## 2015-04-01 DIAGNOSIS — I2510 Atherosclerotic heart disease of native coronary artery without angina pectoris: Secondary | ICD-10-CM | POA: Diagnosis not present

## 2015-04-01 DIAGNOSIS — E1140 Type 2 diabetes mellitus with diabetic neuropathy, unspecified: Secondary | ICD-10-CM | POA: Diagnosis not present

## 2015-05-03 ENCOUNTER — Ambulatory Visit (INDEPENDENT_AMBULATORY_CARE_PROVIDER_SITE_OTHER): Payer: Medicare Other

## 2015-05-03 DIAGNOSIS — B351 Tinea unguium: Secondary | ICD-10-CM

## 2015-05-03 DIAGNOSIS — M79676 Pain in unspecified toe(s): Secondary | ICD-10-CM | POA: Diagnosis not present

## 2015-05-03 NOTE — Progress Notes (Signed)
HPI Presents today chief complaint of painful elongated toenails.  Objective: Pulses are palpable bilateral nails are thick, yellow dystrophic onychomycosis and painful palpation.   Assessment: Onychomycosis with pain in limb.  Plan: Treatment of nails in thickness and length as covered service secondary to pain.

## 2015-05-13 DIAGNOSIS — N401 Enlarged prostate with lower urinary tract symptoms: Secondary | ICD-10-CM | POA: Diagnosis not present

## 2015-05-13 DIAGNOSIS — N528 Other male erectile dysfunction: Secondary | ICD-10-CM | POA: Diagnosis not present

## 2015-05-13 DIAGNOSIS — R3911 Hesitancy of micturition: Secondary | ICD-10-CM | POA: Diagnosis not present

## 2015-05-13 DIAGNOSIS — N481 Balanitis: Secondary | ICD-10-CM | POA: Diagnosis not present

## 2015-05-13 DIAGNOSIS — N4340 Spermatocele of epididymis, unspecified: Secondary | ICD-10-CM | POA: Diagnosis not present

## 2015-06-05 DIAGNOSIS — R531 Weakness: Secondary | ICD-10-CM | POA: Diagnosis not present

## 2015-06-05 DIAGNOSIS — R42 Dizziness and giddiness: Secondary | ICD-10-CM | POA: Diagnosis not present

## 2015-06-05 DIAGNOSIS — R350 Frequency of micturition: Secondary | ICD-10-CM | POA: Diagnosis not present

## 2015-06-05 DIAGNOSIS — E1165 Type 2 diabetes mellitus with hyperglycemia: Secondary | ICD-10-CM | POA: Diagnosis not present

## 2015-06-05 DIAGNOSIS — Z794 Long term (current) use of insulin: Secondary | ICD-10-CM | POA: Diagnosis not present

## 2015-06-12 DIAGNOSIS — E119 Type 2 diabetes mellitus without complications: Secondary | ICD-10-CM | POA: Diagnosis not present

## 2015-06-12 DIAGNOSIS — Z794 Long term (current) use of insulin: Secondary | ICD-10-CM | POA: Diagnosis not present

## 2015-07-08 DIAGNOSIS — Z794 Long term (current) use of insulin: Secondary | ICD-10-CM | POA: Diagnosis not present

## 2015-07-08 DIAGNOSIS — E119 Type 2 diabetes mellitus without complications: Secondary | ICD-10-CM | POA: Diagnosis not present

## 2015-07-08 DIAGNOSIS — R270 Ataxia, unspecified: Secondary | ICD-10-CM | POA: Diagnosis not present

## 2015-07-08 DIAGNOSIS — I10 Essential (primary) hypertension: Secondary | ICD-10-CM | POA: Diagnosis not present

## 2015-07-08 DIAGNOSIS — E1165 Type 2 diabetes mellitus with hyperglycemia: Secondary | ICD-10-CM | POA: Diagnosis not present

## 2015-07-08 DIAGNOSIS — E1140 Type 2 diabetes mellitus with diabetic neuropathy, unspecified: Secondary | ICD-10-CM | POA: Diagnosis not present

## 2015-07-08 DIAGNOSIS — Z79899 Other long term (current) drug therapy: Secondary | ICD-10-CM | POA: Diagnosis not present

## 2015-07-23 ENCOUNTER — Encounter (HOSPITAL_COMMUNITY): Payer: Self-pay | Admitting: *Deleted

## 2015-07-23 ENCOUNTER — Inpatient Hospital Stay (HOSPITAL_COMMUNITY)
Admission: EM | Admit: 2015-07-23 | Discharge: 2015-07-29 | DRG: 379 | Disposition: A | Discharge status: Home Health | Payer: Medicare Other | Attending: Internal Medicine | Admitting: Internal Medicine

## 2015-07-23 DIAGNOSIS — D62 Acute posthemorrhagic anemia: Secondary | ICD-10-CM | POA: Diagnosis not present

## 2015-07-23 DIAGNOSIS — Z85118 Personal history of other malignant neoplasm of bronchus and lung: Secondary | ICD-10-CM

## 2015-07-23 DIAGNOSIS — K625 Hemorrhage of anus and rectum: Secondary | ICD-10-CM | POA: Diagnosis present

## 2015-07-23 DIAGNOSIS — E119 Type 2 diabetes mellitus without complications: Secondary | ICD-10-CM

## 2015-07-23 DIAGNOSIS — K921 Melena: Secondary | ICD-10-CM | POA: Diagnosis not present

## 2015-07-23 DIAGNOSIS — K5791 Diverticulosis of intestine, part unspecified, without perforation or abscess with bleeding: Secondary | ICD-10-CM | POA: Diagnosis not present

## 2015-07-23 DIAGNOSIS — Z7982 Long term (current) use of aspirin: Secondary | ICD-10-CM | POA: Diagnosis not present

## 2015-07-23 DIAGNOSIS — Z955 Presence of coronary angioplasty implant and graft: Secondary | ICD-10-CM

## 2015-07-23 DIAGNOSIS — Z79899 Other long term (current) drug therapy: Secondary | ICD-10-CM | POA: Diagnosis not present

## 2015-07-23 DIAGNOSIS — Z8673 Personal history of transient ischemic attack (TIA), and cerebral infarction without residual deficits: Secondary | ICD-10-CM | POA: Diagnosis not present

## 2015-07-23 DIAGNOSIS — N400 Enlarged prostate without lower urinary tract symptoms: Secondary | ICD-10-CM | POA: Diagnosis present

## 2015-07-23 DIAGNOSIS — E1140 Type 2 diabetes mellitus with diabetic neuropathy, unspecified: Secondary | ICD-10-CM | POA: Diagnosis not present

## 2015-07-23 DIAGNOSIS — K922 Gastrointestinal hemorrhage, unspecified: Secondary | ICD-10-CM | POA: Diagnosis not present

## 2015-07-23 DIAGNOSIS — Z888 Allergy status to other drugs, medicaments and biological substances status: Secondary | ICD-10-CM | POA: Diagnosis not present

## 2015-07-23 DIAGNOSIS — I959 Hypotension, unspecified: Secondary | ICD-10-CM | POA: Diagnosis present

## 2015-07-23 DIAGNOSIS — I2510 Atherosclerotic heart disease of native coronary artery without angina pectoris: Secondary | ICD-10-CM | POA: Diagnosis present

## 2015-07-23 DIAGNOSIS — I10 Essential (primary) hypertension: Secondary | ICD-10-CM | POA: Diagnosis present

## 2015-07-23 DIAGNOSIS — Z87891 Personal history of nicotine dependence: Secondary | ICD-10-CM

## 2015-07-23 DIAGNOSIS — Z86711 Personal history of pulmonary embolism: Secondary | ICD-10-CM | POA: Diagnosis not present

## 2015-07-23 DIAGNOSIS — Z794 Long term (current) use of insulin: Secondary | ICD-10-CM

## 2015-07-23 DIAGNOSIS — E11649 Type 2 diabetes mellitus with hypoglycemia without coma: Secondary | ICD-10-CM | POA: Diagnosis present

## 2015-07-23 DIAGNOSIS — Z7952 Long term (current) use of systemic steroids: Secondary | ICD-10-CM | POA: Diagnosis not present

## 2015-07-23 DIAGNOSIS — Z791 Long term (current) use of non-steroidal anti-inflammatories (NSAID): Secondary | ICD-10-CM | POA: Diagnosis not present

## 2015-07-23 DIAGNOSIS — I9589 Other hypotension: Secondary | ICD-10-CM

## 2015-07-23 LAB — I-STAT CG4 LACTIC ACID, ED: LACTIC ACID, VENOUS: 1.3 mmol/L (ref 0.5–2.0)

## 2015-07-23 LAB — GLUCOSE, CAPILLARY
Glucose-Capillary: 108 mg/dL — ABNORMAL HIGH (ref 65–99)
Glucose-Capillary: 113 mg/dL — ABNORMAL HIGH (ref 65–99)
Glucose-Capillary: 138 mg/dL — ABNORMAL HIGH (ref 65–99)
Glucose-Capillary: 160 mg/dL — ABNORMAL HIGH (ref 65–99)

## 2015-07-23 LAB — COMPREHENSIVE METABOLIC PANEL
ALK PHOS: 92 U/L (ref 38–126)
ALT: 17 U/L (ref 17–63)
AST: 21 U/L (ref 15–41)
Albumin: 3.5 g/dL (ref 3.5–5.0)
Anion gap: 8 (ref 5–15)
BILIRUBIN TOTAL: 0.7 mg/dL (ref 0.3–1.2)
BUN: 13 mg/dL (ref 6–20)
CO2: 30 mmol/L (ref 22–32)
Calcium: 8.8 mg/dL — ABNORMAL LOW (ref 8.9–10.3)
Chloride: 98 mmol/L — ABNORMAL LOW (ref 101–111)
Creatinine, Ser: 0.71 mg/dL (ref 0.61–1.24)
GLUCOSE: 201 mg/dL — AB (ref 65–99)
Potassium: 3.7 mmol/L (ref 3.5–5.1)
SODIUM: 136 mmol/L (ref 135–145)
TOTAL PROTEIN: 5.8 g/dL — AB (ref 6.5–8.1)

## 2015-07-23 LAB — ABO/RH: ABO/RH(D): AB POS

## 2015-07-23 LAB — HEMOGLOBIN AND HEMATOCRIT, BLOOD
HCT: 32 % — ABNORMAL LOW (ref 39.0–52.0)
HCT: 30.1 % — ABNORMAL LOW (ref 39.0–52.0)
HCT: 30.9 % — ABNORMAL LOW (ref 39.0–52.0)
Hemoglobin: 11.3 g/dL — ABNORMAL LOW (ref 13.0–17.0)
Hemoglobin: 10.5 g/dL — ABNORMAL LOW (ref 13.0–17.0)
Hemoglobin: 10.8 g/dL — ABNORMAL LOW (ref 13.0–17.0)

## 2015-07-23 LAB — MRSA PCR SCREENING: MRSA by PCR: NEGATIVE

## 2015-07-23 LAB — CBC
HCT: 37.6 % — ABNORMAL LOW (ref 39.0–52.0)
Hemoglobin: 13.4 g/dL (ref 13.0–17.0)
MCH: 32.1 pg (ref 26.0–34.0)
MCHC: 35.6 g/dL (ref 30.0–36.0)
MCV: 90 fL (ref 78.0–100.0)
Platelets: 216 10*3/uL (ref 150–400)
RBC: 4.18 MIL/uL — ABNORMAL LOW (ref 4.22–5.81)
RDW: 13 % (ref 11.5–15.5)
WBC: 6.6 10*3/uL (ref 4.0–10.5)

## 2015-07-23 LAB — DIFFERENTIAL
BASOS PCT: 0 % (ref 0–1)
Basophils Absolute: 0 10*3/uL (ref 0.0–0.1)
EOS ABS: 0.2 10*3/uL (ref 0.0–0.7)
Eosinophils Relative: 3 % (ref 0–5)
Lymphocytes Relative: 27 % (ref 12–46)
Lymphs Abs: 1.8 10*3/uL (ref 0.7–4.0)
MONOS PCT: 8 % (ref 3–12)
Monocytes Absolute: 0.5 10*3/uL (ref 0.1–1.0)
NEUTROS ABS: 4.1 10*3/uL (ref 1.7–7.7)
Neutrophils Relative %: 62 % (ref 43–77)

## 2015-07-23 LAB — PREPARE RBC (CROSSMATCH)

## 2015-07-23 LAB — POC OCCULT BLOOD, ED: FECAL OCCULT BLD: POSITIVE — AB

## 2015-07-23 MED ORDER — FAMOTIDINE IN NACL 20-0.9 MG/50ML-% IV SOLN
20.0000 mg | Freq: Two times a day (BID) | INTRAVENOUS | Status: DC
Start: 2015-07-23 — End: 2015-07-27
  Administered 2015-07-23 – 2015-07-27 (×9): 20 mg via INTRAVENOUS
  Filled 2015-07-23 (×11): qty 50

## 2015-07-23 MED ORDER — SODIUM CHLORIDE 0.9 % IV SOLN
INTRAVENOUS | Status: AC
  Administered 2015-07-23: 05:00:00 via INTRAVENOUS

## 2015-07-23 MED ORDER — SODIUM CHLORIDE 0.9 % IV SOLN
Freq: Once | INTRAVENOUS | Status: AC
  Administered 2015-07-23: 16:00:00 via INTRAVENOUS

## 2015-07-23 MED ORDER — PANTOPRAZOLE SODIUM 40 MG PO TBEC: 40.0000 mg | DELAYED_RELEASE_TABLET | Freq: Every day | ORAL | Status: DC

## 2015-07-23 MED ORDER — EZETIMIBE 10 MG PO TABS
10.0000 mg | ORAL_TABLET | Freq: Every day | ORAL | Status: DC
  Administered 2015-07-23 – 2015-07-29 (×7): 10 mg via ORAL
  Filled 2015-07-23 (×7): qty 1

## 2015-07-23 MED ORDER — SODIUM CHLORIDE 0.9 % IJ SOLN
3.0000 mL | Freq: Two times a day (BID) | INTRAMUSCULAR | Status: DC
  Administered 2015-07-23 – 2015-07-28 (×10): 3 mL via INTRAVENOUS

## 2015-07-23 MED ORDER — SODIUM CHLORIDE 0.9 % IV SOLN
1000.0000 mL | INTRAVENOUS | Status: DC
  Administered 2015-07-24: 1000 mL via INTRAVENOUS

## 2015-07-23 MED ORDER — ONDANSETRON HCL 4 MG/2ML IJ SOLN
4.0000 mg | Freq: Once | INTRAMUSCULAR | Status: AC
  Administered 2015-07-23: 4 mg via INTRAVENOUS
  Filled 2015-07-23: qty 2

## 2015-07-23 MED ORDER — INSULIN GLARGINE 100 UNIT/ML ~~LOC~~ SOLN
45.0000 [IU] | Freq: Every day | SUBCUTANEOUS | Status: DC
  Filled 2015-07-23: qty 0.45

## 2015-07-23 MED ORDER — PEG 3350-KCL-NA BICARB-NACL 420 G PO SOLR
4000.0000 mL | Freq: Once | ORAL | Status: AC
  Administered 2015-07-23: 4000 mL via ORAL
  Filled 2015-07-23: qty 4000

## 2015-07-23 MED ORDER — PANTOPRAZOLE SODIUM 40 MG IV SOLR: 40.0000 mg | Freq: Two times a day (BID) | INTRAVENOUS | Status: DC

## 2015-07-23 MED ORDER — SODIUM CHLORIDE 0.9 % IV SOLN
1000.0000 mL | Freq: Once | INTRAVENOUS | Status: AC
  Administered 2015-07-23: 1000 mL via INTRAVENOUS

## 2015-07-23 MED ORDER — INSULIN ASPART 100 UNIT/ML ~~LOC~~ SOLN
0.0000 [IU] | SUBCUTANEOUS | Status: DC
  Administered 2015-07-23: 1 [IU] via SUBCUTANEOUS
  Administered 2015-07-23: 2 [IU] via SUBCUTANEOUS
  Administered 2015-07-24: 3 [IU] via SUBCUTANEOUS
  Administered 2015-07-24 – 2015-07-25 (×2): 2 [IU] via SUBCUTANEOUS
  Administered 2015-07-25 – 2015-07-26 (×2): 5 [IU] via SUBCUTANEOUS
  Administered 2015-07-26: 2 [IU] via SUBCUTANEOUS
  Administered 2015-07-26: 3 [IU] via SUBCUTANEOUS
  Administered 2015-07-26: 2 [IU] via SUBCUTANEOUS
  Administered 2015-07-26: 1 [IU] via SUBCUTANEOUS
  Administered 2015-07-27 (×3): 2 [IU] via SUBCUTANEOUS

## 2015-07-23 NOTE — Consult Note (Signed)
[facility] CONSULT Reason for consult: G.I. bleeding Referring Physician: [facility]. PCP: Dr. Felipa Eth.  Chad Fuller is an 81 y.o. male.  HPI: the patient has a history of pulmonary embolus many years ago as well as a history of lung cancer. He has type II diabetes. He has never had colon problems and denies any knowledge of prior diverticular disease or G.I. bleeding. His last colonoscopy was many years ago he thinks 15 to 20 years ago and was done here at the [facility]. Neither he nor his daughters who were with him today remember any prior history of diverticular disease. He denies constipation and is not really take anything to prevent constipation. Yesterday evening he had sudden onset of loose stools followed by bright red blood per rectum. There were several episodes of this and he came to the emergency room. His systolic pressure initially was in the 70s and improve rapidly with IV fluids. He had no further bleeding during the day until this afternoon he began to pass bright red blood again. His blood pressure has been stable with systolic pressure is 614 to 130 this p.m. His initial hemoglobin was 13.4 dropping overnight to 11.3 in recent stat hemoglobin was 10.5. The patient's BUN in creatinine on admission were completely normal. He denies indigestion or heartburn. He denies any abdominal pain. He denies any blood thinners and the only blood thinner that he appears to have been taken as an outpatient is aspirin. No known family history colon cancer.  Past Medical History  Diagnosis Date  . Diabetes mellitus   . Cancer     lung  . Prostate enlargement   . History of blood clots   . CAD (coronary artery disease)   . Pulmonary embolus july 2005    Past Surgical History  Procedure Laterality Date  . Lung removal, partial    . Coronary stent placement    . Knee surgery    . Filtering procedure      reports filter placed after knee surgery for blood  clots  . I&d extremity  08/16/2012    Procedure: MINOR IRRIGATION AND DEBRIDEMENT EXTREMITY;  Surgeon: Tennis Must, MD;  Location: [facility];  Service: Orthopedics;  Laterality: Right;    History reviewed. No pertinent family history.  Social History:  reports that he quit smoking about 41 years ago. His smoking use included Cigarettes. He does not have any smokeless tobacco history on file. He reports that he does not drink alcohol or use illicit drugs.  Allergies:  Allergies  Allergen Reactions  . Protonix [Pantoprazole Sodium] Diarrhea    Medications; Prior to Admission medications   Medication Sig Start Date End Date Taking? Authorizing Provider  Aspirin-Salicylamide-Caffeine (BC HEADACHE) 325-95-16 MG TABS Take 1 packet by mouth every morning.   Yes Historical Provider, MD  ezetimibe (ZETIA) 10 MG tablet Take 10 mg by mouth daily.   Yes Historical Provider, MD  GLUCOSAMINE-CHONDROITIN PO Take 2 tablets by mouth every morning.   Yes Historical Provider, MD  insulin glargine (LANTUS) 100 UNIT/ML injection Inject 0.5 mLs (50 Units total) into the skin daily. Patient taking differently: Inject 70-90 Units into the skin every morning.  12/04/14  Yes Ivan Anchors Love, PA-C  losartan-hydrochlorothiazide (HYZAAR) 50-12.5 MG per tablet Take 1 tablet by mouth daily.   Yes Historical Provider, MD  meloxicam (MOBIC) 7.5 MG tablet Take 7.5 mg by mouth daily.   Yes Historical Provider, MD  metFORMIN (GLUCOPHAGE-XR) 500 MG 24  hr tablet Take 500 mg with breakfast and 1000 mg with supper Patient taking differently: Take 500 mg by mouth 2 (two) times daily.  12/04/14  Yes Ivan Anchors Love, PA-C  Multiple Vitamins-Minerals (MULTIVITAMINS THER. W/MINERALS) TABS Take 1 tablet by mouth daily.     Yes Historical Provider, MD  pantoprazole (PROTONIX) 40 MG tablet Take 1 tablet (40 mg total) by mouth daily. 12/04/14  Yes Ivan Anchors Love, PA-C  tamsulosin (FLOMAX) 0.4 MG CAPS capsule Take 0.4 mg by  mouth daily.  09/02/13  Yes Historical Provider, MD   . sodium chloride   Intravenous STAT  . sodium chloride   Intravenous Once  . ezetimibe  10 mg Oral Daily  . famotidine (PEPCID) IV  20 mg Intravenous Q12H  . insulin aspart  0-9 Units Subcutaneous 6 times per day  . polyethylene glycol-electrolytes  4,000 mL Oral Once  . sodium chloride  3 mL Intravenous Q12H   PRN Meds  Results for orders placed or performed during the hospital encounter of 07/23/15 (from the past 48 hour(s))  Comprehensive metabolic panel     Status: Abnormal   Collection Time: 07/23/15  1:35 AM  Result Value Ref Range   Sodium 136 135 - 145 mmol/L   Potassium 3.7 3.5 - 5.1 mmol/L   Chloride 98 (L) 101 - 111 mmol/L   CO2 30 22 - 32 mmol/L   Glucose, Bld 201 (H) 65 - 99 mg/dL   BUN 13 6 - 20 mg/dL   Creatinine, Ser 0.71 0.61 - 1.24 mg/dL   Calcium 8.8 (L) 8.9 - 10.3 mg/dL   Total Protein 5.8 (L) 6.5 - 8.1 g/dL   Albumin 3.5 3.5 - 5.0 g/dL   AST 21 15 - 41 U/L   ALT 17 17 - 63 U/L   Alkaline Phosphatase 92 38 - 126 U/L   Total Bilirubin 0.7 0.3 - 1.2 mg/dL   GFR calc non Af Amer >60 >60 mL/min   GFR calc Af Amer >60 >60 mL/min    Comment: (NOTE) The eGFR has been calculated using the CKD EPI equation. This calculation has not been validated in all clinical situations. eGFR's persistently <60 mL/min signify possible Chronic Kidney Disease.    Anion gap 8 5 - 15  CBC     Status: Abnormal   Collection Time: 07/23/15  1:35 AM  Result Value Ref Range   WBC 6.6 4.0 - 10.5 K/uL   RBC 4.18 (L) 4.22 - 5.81 MIL/uL   Hemoglobin 13.4 13.0 - 17.0 g/dL   HCT 37.6 (L) 39.0 - 52.0 %   MCV 90.0 78.0 - 100.0 fL   MCH 32.1 26.0 - 34.0 pg   MCHC 35.6 30.0 - 36.0 g/dL   RDW 13.0 11.5 - 15.5 %   Platelets 216 150 - 400 K/uL  Type and screen     Status: None (Preliminary result)   Collection Time: 07/23/15  1:35 AM  Result Value Ref Range   ABO/RH(D) AB POS    Antibody Screen NEG    Sample Expiration 07/26/2015     Unit Number [id number]    Blood Component Type RED CELLS,LR    Unit division 00    Status of Unit ISSUED    Transfusion Status OK TO TRANSFUSE    Crossmatch Result Compatible   Differential     Status: None   Collection Time: 07/23/15  1:35 AM  Result Value Ref Range   Neutrophils Relative % 62 43 - 77 %  Neutro Abs 4.1 1.7 - 7.7 K/uL   Lymphocytes Relative 27 12 - 46 %   Lymphs Abs 1.8 0.7 - 4.0 K/uL   Monocytes Relative 8 3 - 12 %   Monocytes Absolute 0.5 0.1 - 1.0 K/uL   Eosinophils Relative 3 0 - 5 %   Eosinophils Absolute 0.2 0.0 - 0.7 K/uL   Basophils Relative 0 0 - 1 %   Basophils Absolute 0.0 0.0 - 0.1 K/uL  ABO/Rh     Status: None   Collection Time: 07/23/15  1:35 AM  Result Value Ref Range   ABO/RH(D) AB POS   POC occult blood, ED     Status: Abnormal   Collection Time: 07/23/15  2:48 AM  Result Value Ref Range   Fecal Occult Bld POSITIVE (A) NEGATIVE  I-Stat CG4 Lactic Acid, ED     Status: None   Collection Time: 07/23/15  2:49 AM  Result Value Ref Range   Lactic Acid, Venous 1.30 0.5 - 2.0 mmol/L  MRSA PCR Screening     Status: None   Collection Time: 07/23/15  5:35 AM  Result Value Ref Range   MRSA by PCR NEGATIVE NEGATIVE    Comment:        The GeneXpert MRSA Assay (FDA approved for NASAL specimens only), is one component of a comprehensive MRSA colonization surveillance program. It is not intended to diagnose MRSA infection nor to guide or monitor treatment for MRSA infections.   Hemoglobin and hematocrit, blood     Status: Abnormal   Collection Time: 07/23/15  8:00 AM  Result Value Ref Range   Hemoglobin 11.3 (L) 13.0 - 17.0 g/dL   HCT 32.0 (L) 39.0 - 52.0 %  Glucose, capillary     Status: Abnormal   Collection Time: 07/23/15 11:52 AM  Result Value Ref Range   Glucose-Capillary 160 (H) 65 - 99 mg/dL  Prepare RBC     Status: None   Collection Time: 07/23/15 12:51 PM  Result Value Ref Range   Order Confirmation ORDER PROCESSED BY BLOOD BANK    Hemoglobin and hematocrit, blood     Status: Abnormal   Collection Time: 07/23/15  1:55 PM  Result Value Ref Range   Hemoglobin 10.5 (L) 13.0 - 17.0 g/dL   HCT 30.1 (L) 39.0 - 52.0 %  Glucose, capillary     Status: Abnormal   Collection Time: 07/23/15  3:08 PM  Result Value Ref Range   Glucose-Capillary 108 (H) 65 - 99 mg/dL    No results found.             Blood pressure 110/61, pulse 83, temperature 97.7 F (36.5 C), temperature source Oral, resp. rate 19, height 6' 2" (1.88 m), weight 99.9 kg (220 lb 3.8 oz), SpO2 97 %.  Physical exam:   General-- pleasant white male sitting on porta potty. Pass darkish maroon stool. ENT-- sclera nonicteric Neck-- no lymphadenopathy Heart-- regular rate and rhythm without murmurs or gallops Lungs-- clear Abdomen-- software completely nontender Psych-- alert and oriented and answers questions appropriately memory appears quite good   Assessment: 1. Painless hematochezia. This is very likely lower G.I. bleed due to diverticulosis. Patients BUN and creatinine are normal and he is having no pain. Fortunately he is not really dropped his hemoglobin that much in his blood pressure now stable. I do think we should plan a colonoscopy as soon as he can be adequately cleaned out. 2. Type II diabetes   Plan:.  1. Start patient  on clear liquids. I agree with given him IV Pepcid. He apparently is intolerant of Protonix. 2. We will plan on colonoscopy in the morning at 8 AM. Long discussion with the patient and his 2 daughters that it is possible that diverticular bleeding will not stop and that he could need urgent angiogram or even surgery. All of this is been discussed in the orders for colonoscopy prep have been written.   Wanna Gully JR,Dejan Angert L 07/23/2015, 4:41 PM   Pager: [phone] If no answer or after hours call [phone]

## 2015-07-23 NOTE — ED Notes (Signed)
Pt in c/o episode of rectal bleeding tonight, denies pain, states blood was bright red, no history of same

## 2015-07-23 NOTE — ED Notes (Addendum)
Pt is currently on the bedside commode. Pt is having his third bowel movement with frank blood. 2 liter normal saline started.

## 2015-07-23 NOTE — Progress Notes (Signed)
   07/23/15 1500  Clinical Encounter Type  Visited With Patient and family together  Visit Type Initial;Spiritual support;Social support  Referral From Nurse  Consult/Referral To Chaplain  Spiritual Encounters  Spiritual Needs Prayer;Emotional  Stress Factors  Patient Stress Factors Other (Comment) (still mourning loss of wife several years ago)  [place] responding to spiritual consult; pt expressed some sadness to RN/MD; [place] met with pt and 2 daughters; [place] talked at length with family and pt about spirituality and prayer; pt would like follow-up visit; pt in good spirits.

## 2015-07-23 NOTE — Progress Notes (Signed)
UR COMPLETED  

## 2015-07-23 NOTE — H&P (Signed)
[facility] History and Physical  Chad Fuller ZOX:[id number] DOB: Jan 22, 1926 DOA: 07/23/2015  Referring physician: EDP PCP: Mathews Argyle, MD   Chief Complaint: Rectal bleeding   HPI: Chad Fuller is a 81 y.o. male who presents to the ED with multiple episodes of BRBPR onset tonight.  He has 4-5 episodes since initial onset at time of presentation.  He has another episode in the ED followed by an episode of hypotension with SBPs in the 70s.  BP improves with IVF bolus.  No anticoagulation, no abdominal pain or other symptoms.  Review of Systems: Systems reviewed.  As above, otherwise negative  Past Medical History  Diagnosis Date  . Diabetes mellitus   . Cancer     lung  . Prostate enlargement   . History of blood clots   . CAD (coronary artery disease)   . Pulmonary embolus july 2005   Past Surgical History  Procedure Laterality Date  . Lung removal, partial    . Coronary stent placement    . Knee surgery    . Filtering procedure      reports filter placed after knee surgery for blood clots  . I&d extremity  08/16/2012    Procedure: MINOR IRRIGATION AND DEBRIDEMENT EXTREMITY;  Surgeon: Tennis Must, MD;  Location: [facility];  Service: Orthopedics;  Laterality: Right;   Social History:  reports that he quit smoking about 41 years ago. His smoking use included Cigarettes. He does not have any smokeless tobacco history on file. He reports that he does not drink alcohol or use illicit drugs.  Allergies  Allergen Reactions  . Protonix [Pantoprazole Sodium] Diarrhea    History reviewed. No pertinent family history.   Prior to Admission medications   Medication Sig Start Date End Date Taking? Authorizing Provider  ACCU-CHEK AVIVA PLUS test strip  03/13/15   Historical Provider, MD  acetaminophen (TYLENOL) 325 MG tablet Take 1-2 tablets (325-650 mg total) by mouth every 4 (four) hours as needed for mild pain. 12/04/14   Bary Leriche, PA-C   aspirin EC 325 MG EC tablet Take 1 tablet (325 mg total) by mouth daily. 12/04/14   Bary Leriche, PA-C  azithromycin (ZITHROMAX) 250 MG tablet  03/04/15   Historical Provider, MD  ezetimibe (ZETIA) 10 MG tablet Take 10 mg by mouth daily.    Historical Provider, MD  finasteride (PROSCAR) 5 MG tablet Take 5 mg by mouth daily.      Historical Provider, MD  food thickener (THICK IT) POWD Take 1 g by mouth as needed (to thicken liquids to nectar consistency). 12/04/14   Bary Leriche, PA-C  hydrochlorothiazide (HYDRODIURIL) 25 MG tablet Take 25 mg by mouth daily. 03/01/15   Historical Provider, MD  hydrochlorothiazide 10 mg/mL SUSP Take 0.63 mLs (6.25 mg total) by mouth daily. 12/04/14   Ivan Anchors Love, PA-C  insulin glargine (LANTUS) 100 UNIT/ML injection Inject 0.5 mLs (50 Units total) into the skin daily. 12/04/14   Bary Leriche, PA-C  metFORMIN (GLUCOPHAGE-XR) 500 MG 24 hr tablet Take 500 mg with breakfast and 1000 mg with supper 12/04/14   Ivan Anchors Love, PA-C  Multiple Vitamins-Minerals (MULTIVITAMINS THER. W/MINERALS) TABS Take 1 tablet by mouth daily.      Historical Provider, MD  pantoprazole (PROTONIX) 40 MG tablet Take 1 tablet (40 mg total) by mouth daily. 12/04/14   Ivan Anchors Love, PA-C  predniSONE (STERAPRED UNI-PAK) 10 MG tablet See admin instructions. 03/04/15   Historical  Provider, MD  tamsulosin (FLOMAX) 0.4 MG CAPS capsule Take 0.4 mg by mouth daily.  09/02/13   Historical Provider, MD   Physical Exam: Filed Vitals:   07/23/15 0345  BP: 116/57  Pulse: 70  Temp:   Resp: 18    BP 116/57 mmHg  Pulse 70  Temp(Src) 98 F (36.7 C) (Oral)  Resp 18  SpO2 97%  General Appearance:    Alert, oriented, no distress, appears stated age  Head:    Normocephalic, atraumatic  Eyes:    PERRL, EOMI, sclera non-icteric        Nose:   Nares without drainage or epistaxis. Mucosa, turbinates normal  Throat:   Moist mucous membranes. Oropharynx without erythema or exudate.  Neck:   Supple. No carotid  bruits.  No thyromegaly.  No lymphadenopathy.   Back:     No CVA tenderness, no spinal tenderness  Lungs:     Clear to auscultation bilaterally, without wheezes, rhonchi or rales  Chest wall:    No tenderness to palpitation  Heart:    Regular rate and rhythm without murmurs, gallops, rubs  Abdomen:     Soft, non-tender, nondistended, normal bowel sounds, no organomegaly  Genitalia:    deferred  Rectal:    deferred  Extremities:   No clubbing, cyanosis or edema.  Pulses:   2+ and symmetric all extremities  Skin:   Skin color, texture, turgor normal, no rashes or lesions  Lymph nodes:   Cervical, supraclavicular, and axillary nodes normal  Neurologic:   CNII-XII intact. Normal strength, sensation and reflexes      throughout    Labs on Admission:  Basic Metabolic Panel:  Recent Labs Lab 07/23/15 0135  NA 136  K 3.7  CL 98*  CO2 30  GLUCOSE 201*  BUN 13  CREATININE 0.71  CALCIUM 8.8*   Liver Function Tests:  Recent Labs Lab 07/23/15 0135  AST 21  ALT 17  ALKPHOS 92  BILITOT 0.7  PROT 5.8*  ALBUMIN 3.5   No results for input(s): LIPASE, AMYLASE in the last 168 hours. No results for input(s): AMMONIA in the last 168 hours. CBC:  Recent Labs Lab 07/23/15 0135  WBC 6.6  NEUTROABS 4.1  HGB 13.4  HCT 37.6*  MCV 90.0  PLT 216   Cardiac Enzymes: No results for input(s): CKTOTAL, CKMB, CKMBINDEX, TROPONINI in the last 168 hours.  BNP (last 3 results) No results for input(s): PROBNP in the last 8760 hours. CBG: No results for input(s): GLUCAP in the last 168 hours.  Radiological Exams on Admission: No results found.  EKG: Independently reviewed.  Assessment/Plan Principal Problem:   Rectal bleeding Active Problems:   GI bleed   Hypotension   1. GI bleed - likely lower 1. Dr. Ardis Hughs consulted by EDP and will see patient early this morning 2. Repeat H/H ordered for 8 AM, no drop in HGB yet (13 on admit) 3. NPO 4. Hold ASA 5.  2. Hypotension -  Episode of hypotension resolved with IVF 1. Continue IVF 2. If further hypotension or GI bleeding occurs will need to re-contact Dr. Ardis Hughs 3. Hold BP meds 3. DM2 - 1. Half home lantus dose 2. Low dose SSI q4h 3. Hold metformin  Dr. Ardis Hughs consulted by EDP  Code Status: Full  Family Communication: Son at bedside Disposition Plan: Admit to inpatient   Time spent: 70 min  Daphene Chisholm M. [facility] Pager [phone]  If 7AM-7PM, please contact the day team taking care  of the patient [email or web address] Password [email or web address] 07/23/2015, 4:11 AM

## 2015-07-23 NOTE — Progress Notes (Addendum)
[facility] PROGRESS NOTE  Chad Fuller MPN:[id number] DOB: August 08, 1926 DOA: 07/23/2015 PCP: Chad Argyle, MD  Assessment/Plan: Chad Fuller is a 81 y.o. male who presents to the ED with multiple episodes of BRBPR onset tonight. He has 4-5 episodes since initial onset at time of presentation. He has another episode in the ED followed by an episode of hypotension with SBPs in the 70s. BP improves with IVF bolus. No anticoagulation, no abdominal pain or other symptoms.  1-GI bleed, hematochezia:  IV fluids.  Cycle Hb. hb pending for this morning.  Blood transfusion as needed.  Dr Chad Fuller consulted.  IV Pepcid, intolerant to Protonix.   Hypotension:  In setting of GI bleed.  BP now stable.  Improved with IV fluids.  Cycle hb.   Diabetes: hold lantus due to NPO status.  Will order SSI.   HTN; hold diuretic in setting of GI bleed and hypotension.   Dizziness;  He report dizziness started back 4 weeks ago. He initially had dizziness with his stroke but resolved.  -Will need evaluation if dizziness persist after GI and BP stable.   Code Status: full code.  Family Communication: Care discussed with patient.  Disposition Plan: Remain in the step down unit.    Consultants:  GI,   Procedures:  none  Antibiotics:  none  HPI/Subjective: Last BM was last night, none this am.. No abdominal pain. Had mild nausea while BP was low. He takes a BC powder every morning.   Objective: Filed Vitals:   07/23/15 0738  BP: 127/66  Pulse: 84  Temp: 97.5 F (36.4 C)  Resp: 18    Intake/Output Summary (Last 24 hours) at 07/23/15 0755 Last data filed at 07/23/15 0700  Gross per 24 hour  Intake    230 ml  Output      0 ml  Net    230 ml   Filed Weights   07/23/15 0449  Weight: 99.9 kg (220 lb 3.8 oz)    Exam:   General:  Alert in acute distress.   Cardiovascular: S 1, S 2 RRR  Respiratory: CTA  Abdomen: BS present, soft, nt  Musculoskeletal: no  edema  Data Reviewed: Basic Metabolic Panel:  Recent Labs Lab 07/23/15 0135  NA 136  K 3.7  CL 98*  CO2 30  GLUCOSE 201*  BUN 13  CREATININE 0.71  CALCIUM 8.8*   Liver Function Tests:  Recent Labs Lab 07/23/15 0135  AST 21  ALT 17  ALKPHOS 92  BILITOT 0.7  PROT 5.8*  ALBUMIN 3.5   No results for input(s): LIPASE, AMYLASE in the last 168 hours. No results for input(s): AMMONIA in the last 168 hours. CBC:  Recent Labs Lab 07/23/15 0135  WBC 6.6  NEUTROABS 4.1  HGB 13.4  HCT 37.6*  MCV 90.0  PLT 216   Cardiac Enzymes: No results for input(s): CKTOTAL, CKMB, CKMBINDEX, TROPONINI in the last 168 hours. BNP (last 3 results) No results for input(s): BNP in the last 8760 hours.  ProBNP (last 3 results) No results for input(s): PROBNP in the last 8760 hours.  CBG: No results for input(s): GLUCAP in the last 168 hours.  Recent Results (from the past 240 hour(s))  MRSA PCR Screening     Status: None   Collection Time: 07/23/15  5:35 AM  Result Value Ref Range Status   MRSA by PCR NEGATIVE NEGATIVE Final    Comment:        The GeneXpert MRSA Assay (FDA  approved for NASAL specimens only), is one component of a comprehensive MRSA colonization surveillance program. It is not intended to diagnose MRSA infection nor to guide or monitor treatment for MRSA infections.      Studies: No results found.  Scheduled Meds: . sodium chloride   Intravenous STAT  . ezetimibe  10 mg Oral Daily  . insulin aspart  0-9 Units Subcutaneous 6 times per day  . pantoprazole (PROTONIX) IV  40 mg Intravenous Q12H  . sodium chloride  3 mL Intravenous Q12H   Continuous Infusions: . sodium chloride      Principal Problem:   Rectal bleeding Active Problems:   Type 2 diabetes mellitus   GI bleed   Hypotension    Time spent: 35 minutes.     Chad Fuller A  [facility] Pager [phone]. If 7PM-7AM, please contact night-coverage at [email or web address], password  Chad Fuller 07/23/2015, 7:55 AM  LOS: 0 days     Called by nurse that patient has had 3 large Bloody Bowel movement. BP decreased from 130 to 100. His hb has drop from 13 to 10. I will transfuse one unit in setting of active bleeding. GI contacted Dr Chad Fuller and ware.  Chad Fuller

## 2015-07-23 NOTE — ED Notes (Signed)
Report given to RN on 3S

## 2015-07-23 NOTE — ED Provider Notes (Signed)
CSN: [id number]   Arrival date & time 07/23/15 0127  History  This chart was scribed for  Delora Fuel, MD by Altamease Oiler, ED Scribe. This patient was seen in room B15C/B15C and the patient's care was started at 2:12 AM.  Chief Complaint  Patient presents with  . Rectal Bleeding    HPI The history is provided by the patient and a relative. No language interpreter was used.   Chad Fuller is a 81 y.o. male with PMHx of DM, CAD, PE, lung cancer, and prostate enlargement who presents to the Emergency Department complaining of new bright red rectal bleeding with onset tonight. He has had 4-5 episodes since the initial onset. Associated symptoms include nausea and light headedness. Pt denies abdominal or other pain. No anticoagulation.   Past Medical History  Diagnosis Date  . Diabetes mellitus   . Cancer     lung  . Prostate enlargement   . History of blood clots   . CAD (coronary artery disease)   . Pulmonary embolus july 2005    Past Surgical History  Procedure Laterality Date  . Lung removal, partial    . Coronary stent placement    . Knee surgery    . Filtering procedure      reports filter placed after knee surgery for blood clots  . I&d extremity  08/16/2012    Procedure: MINOR IRRIGATION AND DEBRIDEMENT EXTREMITY;  Surgeon: Tennis Must, MD;  Location: [facility];  Service: Orthopedics;  Laterality: Right;    History reviewed. No pertinent family history.  History  Substance Use Topics  . Smoking status: Former Smoker    Types: Cigarettes    Quit date: 07/28/1974  . Smokeless tobacco: Not on file  . Alcohol Use: No     Review of Systems  Gastrointestinal: Positive for nausea. Negative for abdominal pain.       Rectal bleeding  Neurological: Positive for light-headedness.  All other systems reviewed and are negative.    Home Medications   Prior to Admission medications   Medication Sig Start Date End Date Taking? Authorizing Provider   ACCU-CHEK AVIVA PLUS test strip  03/13/15   Historical Provider, MD  acetaminophen (TYLENOL) 325 MG tablet Take 1-2 tablets (325-650 mg total) by mouth every 4 (four) hours as needed for mild pain. 12/04/14   Bary Leriche, PA-C  aspirin EC 325 MG EC tablet Take 1 tablet (325 mg total) by mouth daily. 12/04/14   Bary Leriche, PA-C  azithromycin (ZITHROMAX) 250 MG tablet  03/04/15   Historical Provider, MD  ezetimibe (ZETIA) 10 MG tablet Take 10 mg by mouth daily.    Historical Provider, MD  finasteride (PROSCAR) 5 MG tablet Take 5 mg by mouth daily.      Historical Provider, MD  food thickener (THICK IT) POWD Take 1 g by mouth as needed (to thicken liquids to nectar consistency). 12/04/14   Bary Leriche, PA-C  hydrochlorothiazide (HYDRODIURIL) 25 MG tablet Take 25 mg by mouth daily. 03/01/15   Historical Provider, MD  hydrochlorothiazide 10 mg/mL SUSP Take 0.63 mLs (6.25 mg total) by mouth daily. 12/04/14   Ivan Anchors Love, PA-C  insulin glargine (LANTUS) 100 UNIT/ML injection Inject 0.5 mLs (50 Units total) into the skin daily. 12/04/14   Bary Leriche, PA-C  metFORMIN (GLUCOPHAGE-XR) 500 MG 24 hr tablet Take 500 mg with breakfast and 1000 mg with supper 12/04/14   Ivan Anchors Love, PA-C  Multiple Vitamins-Minerals (MULTIVITAMINS  THER. W/MINERALS) TABS Take 1 tablet by mouth daily.      Historical Provider, MD  pantoprazole (PROTONIX) 40 MG tablet Take 1 tablet (40 mg total) by mouth daily. 12/04/14   Ivan Anchors Love, PA-C  predniSONE (STERAPRED UNI-PAK) 10 MG tablet See admin instructions. 03/04/15   Historical Provider, MD  tamsulosin (FLOMAX) 0.4 MG CAPS capsule Take 0.4 mg by mouth daily.  09/02/13   Historical Provider, MD    Allergies  Protonix  Triage Vitals: BP 129/68 mmHg  Pulse 92  Temp(Src) 98 F (36.7 C) (Oral)  Resp 20  SpO2 95%  Physical Exam  Constitutional: He is oriented to person, place, and time. He appears well-developed and well-nourished. No distress.  HENT:  Head: Normocephalic and  atraumatic.  Eyes: Conjunctivae and EOM are normal. Pupils are equal, round, and reactive to light.  Neck: Normal range of motion. Neck supple. No JVD present.  Cardiovascular: Normal rate.   Pulmonary/Chest: Effort normal. No respiratory distress.  Abdominal: Soft. Bowel sounds are normal. He exhibits no distension and no mass. There is no guarding.  Musculoskeletal: Normal range of motion. He exhibits no edema.  Lymphadenopathy:    He has no cervical adenopathy.  Neurological: He is alert and oriented to person, place, and time. No cranial nerve deficit. He exhibits normal muscle tone. Coordination normal.  Skin: Skin is warm and dry. No rash noted.  Psychiatric: He has a normal mood and affect. His behavior is normal. Judgment and thought content normal.  Nursing note and vitals reviewed.   ED Course  Procedures   DIAGNOSTIC STUDIES: Oxygen Saturation is 95% on RA, normal by my interpretation.    COORDINATION OF CARE: 2:16 AM Pt passed a moderate amount of dark red stool upon arrival to the examination room. Discussed treatment plan which includes lab work and admission to the hospital with pt at bedside and pt agreed to plan.  3:52 AM-Consult complete with Dr. Ardis Hughs (GI). Patient case explained and discussed. Call ended at 3:53 AM.  3:53 AM-Consult complete with Dr. Alcario Drought (Hospitalist). Patient case explained and discussed. Dr. Alcario Drought agrees to admit patient for further evaluation and treatment. Call ended at 3:56 AM.     Labs Review-  Results for orders placed or performed during the hospital encounter of 07/23/15  MRSA PCR Screening  Result Value Ref Range   MRSA by PCR NEGATIVE NEGATIVE  Comprehensive metabolic panel  Result Value Ref Range   Sodium 136 135 - 145 mmol/L   Potassium 3.7 3.5 - 5.1 mmol/L   Chloride 98 (L) 101 - 111 mmol/L   CO2 30 22 - 32 mmol/L   Glucose, Bld 201 (H) 65 - 99 mg/dL   BUN 13 6 - 20 mg/dL   Creatinine, Ser 0.71 0.61 - 1.24 mg/dL    Calcium 8.8 (L) 8.9 - 10.3 mg/dL   Total Protein 5.8 (L) 6.5 - 8.1 g/dL   Albumin 3.5 3.5 - 5.0 g/dL   AST 21 15 - 41 U/L   ALT 17 17 - 63 U/L   Alkaline Phosphatase 92 38 - 126 U/L   Total Bilirubin 0.7 0.3 - 1.2 mg/dL   GFR calc non Af Amer >60 >60 mL/min   GFR calc Af Amer >60 >60 mL/min   Anion gap 8 5 - 15  CBC  Result Value Ref Range   WBC 6.6 4.0 - 10.5 K/uL   RBC 4.18 (L) 4.22 - 5.81 MIL/uL   Hemoglobin 13.4 13.0 - 17.0 g/dL  HCT 37.6 (L) 39.0 - 52.0 %   MCV 90.0 78.0 - 100.0 fL   MCH 32.1 26.0 - 34.0 pg   MCHC 35.6 30.0 - 36.0 g/dL   RDW 13.0 11.5 - 15.5 %   Platelets 216 150 - 400 K/uL  Differential  Result Value Ref Range   Neutrophils Relative % 62 43 - 77 %   Neutro Abs 4.1 1.7 - 7.7 K/uL   Lymphocytes Relative 27 12 - 46 %   Lymphs Abs 1.8 0.7 - 4.0 K/uL   Monocytes Relative 8 3 - 12 %   Monocytes Absolute 0.5 0.1 - 1.0 K/uL   Eosinophils Relative 3 0 - 5 %   Eosinophils Absolute 0.2 0.0 - 0.7 K/uL   Basophils Relative 0 0 - 1 %   Basophils Absolute 0.0 0.0 - 0.1 K/uL  Hemoglobin and hematocrit, blood  Result Value Ref Range   Hemoglobin 11.3 (L) 13.0 - 17.0 g/dL   HCT 32.0 (L) 39.0 - 52.0 %  POC occult blood, ED  Result Value Ref Range   Fecal Occult Bld POSITIVE (A) NEGATIVE  I-Stat CG4 Lactic Acid, ED  Result Value Ref Range   Lactic Acid, Venous 1.30 0.5 - 2.0 mmol/L  Type and screen  Result Value Ref Range   ABO/RH(D) AB POS    Antibody Screen NEG    Sample Expiration 07/26/2015   ABO/Rh  Result Value Ref Range   ABO/RH(D) AB POS      EKG Interpretation   Date/Time:  Tuesday July 23 2015 02:42:47 EDT Ventricular Rate:  74 PR Interval:  180 QRS Duration: 98 QT Interval:  391 QTC Calculation: 434 R Axis:   23 Text Interpretation:  Sinus rhythm Normal ECG When compared with ECG of  11/18/2014, No significant change was found Confirmed by [facility]  MD, Kammi Hechler  ([id number]) on 07/23/2015 2:49:52 AM      CRITICAL CARE Performed by:  HKVQQ,VZDGL Total critical care time: 55 minutes Critical care time was exclusive of separately billable procedures and treating other patients. Critical care was necessary to treat or prevent imminent or life-threatening deterioration. Critical care was time spent personally by me on the following activities: development of treatment plan with patient and/or surrogate as well as nursing, discussions with consultants, evaluation of patient's response to treatment, examination of patient, obtaining history from patient or surrogate, ordering and performing treatments and interventions, ordering and review of laboratory studies, ordering and review of radiographic studies, pulse oximetry and re-evaluation of patient's condition.   MDM   Final diagnoses:  Rectal bleeding  Other specified hypotension     GI bleed which is probably lower gastrointestinal bleed. Most likely sources diverticulosis although AV malformation is also a diagnostic possibility. He is started on IV fluids. Blood pressure did drop down to 62 but came back up with IV fluids. Blood pressures remained stable after that. Hemoglobin has not dropped from baseline and lactic acid level is normal. Failure of hemoglobin to fall is felt to be indicative of onset of bleeding. Case is discussed with Dr.  Hal Hope of [facility] who agrees to admit the patient to stepdown unit. Case was also discussed with Dr. Ardis Hughs of gastroenterology service who agrees to see the patient in consult and be available for emergent colonoscopy if needed.  I personally performed the services described in this documentation, which was scribed in my presence. The recorded information has been reviewed and is accurate.      Delora Fuel, MD 87/56/43  [id number] 

## 2015-07-24 ENCOUNTER — Encounter (HOSPITAL_COMMUNITY): Payer: Self-pay

## 2015-07-24 ENCOUNTER — Encounter (HOSPITAL_COMMUNITY)
Admission: EM | Disposition: A | Discharge status: Home Health | Payer: Self-pay | Source: Home / Self Care | Attending: Internal Medicine

## 2015-07-24 DIAGNOSIS — K625 Hemorrhage of anus and rectum: Secondary | ICD-10-CM

## 2015-07-24 DIAGNOSIS — I9589 Other hypotension: Secondary | ICD-10-CM

## 2015-07-24 DIAGNOSIS — E1140 Type 2 diabetes mellitus with diabetic neuropathy, unspecified: Secondary | ICD-10-CM

## 2015-07-24 HISTORY — PX: ESOPHAGOGASTRODUODENOSCOPY: SHX5428

## 2015-07-24 HISTORY — PX: COLONOSCOPY: SHX5424

## 2015-07-24 LAB — GLUCOSE, CAPILLARY
GLUCOSE-CAPILLARY: 118 mg/dL — AB (ref 65–99)
Glucose-Capillary: 78 mg/dL (ref 65–99)
Glucose-Capillary: 61 mg/dL — ABNORMAL LOW (ref 65–99)
Glucose-Capillary: 92 mg/dL (ref 65–99)
Glucose-Capillary: 126 mg/dL — ABNORMAL HIGH (ref 65–99)
Glucose-Capillary: 209 mg/dL — ABNORMAL HIGH (ref 65–99)

## 2015-07-24 LAB — HEMOGLOBIN AND HEMATOCRIT, BLOOD
HCT: 27.7 % — ABNORMAL LOW (ref 39.0–52.0)
HCT: 27.2 % — ABNORMAL LOW (ref 39.0–52.0)
HCT: 24.5 % — ABNORMAL LOW (ref 39.0–52.0)
HCT: 22 % — ABNORMAL LOW (ref 39.0–52.0)
HCT: 22.9 % — ABNORMAL LOW (ref 39.0–52.0)
HEMOGLOBIN: 7.8 g/dL — AB (ref 13.0–17.0)
HEMOGLOBIN: 8.6 g/dL — AB (ref 13.0–17.0)
HEMOGLOBIN: 9.8 g/dL — AB (ref 13.0–17.0)
Hemoglobin: 9.4 g/dL — ABNORMAL LOW (ref 13.0–17.0)
Hemoglobin: 8.3 g/dL — ABNORMAL LOW (ref 13.0–17.0)

## 2015-07-24 LAB — PREPARE RBC (CROSSMATCH)

## 2015-07-24 SURGERY — COLONOSCOPY
Anesthesia: Moderate Sedation | Site: Mouth

## 2015-07-24 SURGERY — COLONOSCOPY
Anesthesia: Moderate Sedation

## 2015-07-24 MED ORDER — POLYETHYLENE GLYCOL 3350 17 G PO PACK
17.0000 g | PACK | Freq: Every day | ORAL | Status: DC
  Administered 2015-07-24 – 2015-07-26 (×3): 17 g via ORAL
  Filled 2015-07-24 (×5): qty 1

## 2015-07-24 MED ORDER — FENTANYL CITRATE (PF) 100 MCG/2ML IJ SOLN
INTRAMUSCULAR | Status: DC | PRN
  Administered 2015-07-24 (×5): 25 ug via INTRAVENOUS

## 2015-07-24 MED ORDER — SODIUM CHLORIDE 0.9 % IV SOLN: Freq: Once | INTRAVENOUS | Status: AC

## 2015-07-24 MED ORDER — MIDAZOLAM HCL 5 MG/ML IJ SOLN
INTRAMUSCULAR | Status: AC
  Filled 2015-07-24: qty 2

## 2015-07-24 MED ORDER — SODIUM CHLORIDE 0.9 % IV SOLN: INTRAVENOUS | Status: DC

## 2015-07-24 MED ORDER — FENTANYL CITRATE (PF) 100 MCG/2ML IJ SOLN
INTRAMUSCULAR | Status: AC
  Filled 2015-07-24: qty 2

## 2015-07-24 MED ORDER — DIPHENHYDRAMINE HCL 50 MG/ML IJ SOLN
INTRAMUSCULAR | Status: AC
  Filled 2015-07-24: qty 1

## 2015-07-24 MED ORDER — DEXTROSE 50 % IV SOLN
INTRAVENOUS | Status: AC
  Administered 2015-07-24: 25 mL
  Filled 2015-07-24: qty 50

## 2015-07-24 MED ORDER — MIDAZOLAM HCL 5 MG/5ML IJ SOLN
INTRAMUSCULAR | Status: DC | PRN
  Administered 2015-07-24: 2 mg via INTRAVENOUS
  Administered 2015-07-24: 1 mg via INTRAVENOUS
  Administered 2015-07-24 (×2): 2 mg via INTRAVENOUS

## 2015-07-24 NOTE — Consult Note (Signed)
Chad Fuller 1926-04-26  [id number].   Primary Care MD: Dr. Lajean Manes Requesting MD: Dr. Marzetta Board Chief Complaint/Reason for Consult: GI bleed HPI: This is an 81 yo white male who was admitted yesterday after having multiple episodes of BRBPR at home.  This was painless. He does take [place] powders everyday at home.  He denies syncope or presyncopal symptoms at home.  His son brought him to the ED for evaluation.  His BP was in the 70s.  He was admitted. His initial hgb was 13.4 and has dropped to 9.4.  He has been transfused with 1 unit of blood.  GI was called and he was prepped for a colonoscopy.  He has not had any other BRBPR since his prep.  His colonoscopy revealed blood in his transverse, left, sigmoid, and rectum.  There was no blood seen in the right colon.  He had an EGD as well that was negative.  It is felt that his bleed is diverticular in origin.  We have been asked to see the patient to follow along for further recommendations as needed.  ROS : Please see HPI, otherwise negative, except for occasional falls since his mild CVA  History reviewed. No pertinent family history.  Past Medical History  Diagnosis Date  . Diabetes mellitus   . Cancer     lung  . Prostate enlargement   . History of blood clots   . CAD (coronary artery disease)   . Pulmonary embolus july 2005  -TIA/CVA (10/2014)  Past Surgical History  Procedure Laterality Date  . Lung removal, partial    . Coronary stent placement    . Knee surgery    . Filtering procedure      reports filter placed after knee surgery for blood clots  . I&d extremity  08/16/2012    Procedure: MINOR IRRIGATION AND DEBRIDEMENT EXTREMITY;  Surgeon: Tennis Must, MD;  Location: [facility];  Service: Orthopedics;  Laterality: Right;    Social History:  reports that he quit smoking about 41 years ago. His smoking use included Cigarettes. He does not have any smokeless tobacco history on file. He reports  that he does not drink alcohol or use illicit drugs.  Allergies:  Allergies  Allergen Reactions  . Protonix [Pantoprazole Sodium] Diarrhea    Medications Prior to Admission  Medication Sig Dispense Refill  . Aspirin-Salicylamide-Caffeine (BC HEADACHE) 325-95-16 MG TABS Take 1 packet by mouth every morning.    . ezetimibe (ZETIA) 10 MG tablet Take 10 mg by mouth daily.    Marland Kitchen GLUCOSAMINE-CHONDROITIN PO Take 2 tablets by mouth every morning.    . insulin glargine (LANTUS) 100 UNIT/ML injection Inject 0.5 mLs (50 Units total) into the skin daily. (Patient taking differently: Inject 70-90 Units into the skin every morning. ) 10 mL 11  . losartan-hydrochlorothiazide (HYZAAR) 50-12.5 MG per tablet Take 1 tablet by mouth daily.    . meloxicam (MOBIC) 7.5 MG tablet Take 7.5 mg by mouth daily.    . metFORMIN (GLUCOPHAGE-XR) 500 MG 24 hr tablet Take 500 mg with breakfast and 1000 mg with supper (Patient taking differently: Take 500 mg by mouth 2 (two) times daily. )    . Multiple Vitamins-Minerals (MULTIVITAMINS THER. W/MINERALS) TABS Take 1 tablet by mouth daily.      . pantoprazole (PROTONIX) 40 MG tablet Take 1 tablet (40 mg total) by mouth daily.    . tamsulosin (FLOMAX) 0.4 MG CAPS capsule Take 0.4 mg by mouth daily.  Blood pressure 122/62, pulse 80, temperature 98.5 F (36.9 C), temperature source Oral, resp. rate 21, height 6' 2"  (1.88 m), weight 99.9 kg (220 lb 3.8 oz), SpO2 98 %. Physical Exam: General: pleasant, obese white male who is laying in bed in NAD HEENT: head is normocephalic, atraumatic.  Sclera are noninjected.  PERRL.  Ears and nose without any masses or lesions.  Mouth is pink and moist Heart: regular, rate, and rhythm.  Normal s1,s2. No obvious murmurs, gallops, or rubs noted.  Palpable radial and pedal pulses bilaterally Lungs: CTAB, no wheezes, rhonchi, or rales noted.  Respiratory effort nonlabored Abd: soft, NT, ND, +BS, no masses, hernias, or organomegaly MS: all 4  extremities are symmetrical with no cyanosis, clubbing, but with trace pedal edema Skin: warm and dry with no masses, lesions, or rashes Psych: A&Ox3 with an appropriate affect.    Results for orders placed or performed during the hospital encounter of 07/23/15 (from the past 48 hour(s))  Comprehensive metabolic panel     Status: Abnormal   Collection Time: 07/23/15  1:35 AM  Result Value Ref Range   Sodium 136 135 - 145 mmol/L   Potassium 3.7 3.5 - 5.1 mmol/L   Chloride 98 (L) 101 - 111 mmol/L   CO2 30 22 - 32 mmol/L   Glucose, Bld 201 (H) 65 - 99 mg/dL   BUN 13 6 - 20 mg/dL   Creatinine, Ser 0.71 0.61 - 1.24 mg/dL   Calcium 8.8 (L) 8.9 - 10.3 mg/dL   Total Protein 5.8 (L) 6.5 - 8.1 g/dL   Albumin 3.5 3.5 - 5.0 g/dL   AST 21 15 - 41 U/L   ALT 17 17 - 63 U/L   Alkaline Phosphatase 92 38 - 126 U/L   Total Bilirubin 0.7 0.3 - 1.2 mg/dL   GFR calc non Af Amer >60 >60 mL/min   GFR calc Af Amer >60 >60 mL/min    Comment: (NOTE) The eGFR has been calculated using the CKD EPI equation. This calculation has not been validated in all clinical situations. eGFR's persistently <60 mL/min signify possible Chronic Kidney Disease.    Anion gap 8 5 - 15  CBC     Status: Abnormal   Collection Time: 07/23/15  1:35 AM  Result Value Ref Range   WBC 6.6 4.0 - 10.5 K/uL   RBC 4.18 (L) 4.22 - 5.81 MIL/uL   Hemoglobin 13.4 13.0 - 17.0 g/dL   HCT 37.6 (L) 39.0 - 52.0 %   MCV 90.0 78.0 - 100.0 fL   MCH 32.1 26.0 - 34.0 pg   MCHC 35.6 30.0 - 36.0 g/dL   RDW 13.0 11.5 - 15.5 %   Platelets 216 150 - 400 K/uL  Type and screen     Status: None   Collection Time: 07/23/15  1:35 AM  Result Value Ref Range   ABO/RH(D) AB POS    Antibody Screen NEG    Sample Expiration 07/26/2015    Unit Number [id number]    Blood Component Type RED CELLS,LR    Unit division 00    Status of Unit ISSUED,FINAL    Transfusion Status OK TO TRANSFUSE    Crossmatch Result Compatible   Differential     Status: None    Collection Time: 07/23/15  1:35 AM  Result Value Ref Range   Neutrophils Relative % 62 43 - 77 %   Neutro Abs 4.1 1.7 - 7.7 K/uL   Lymphocytes Relative 27 12 - 46 %  Lymphs Abs 1.8 0.7 - 4.0 K/uL   Monocytes Relative 8 3 - 12 %   Monocytes Absolute 0.5 0.1 - 1.0 K/uL   Eosinophils Relative 3 0 - 5 %   Eosinophils Absolute 0.2 0.0 - 0.7 K/uL   Basophils Relative 0 0 - 1 %   Basophils Absolute 0.0 0.0 - 0.1 K/uL  ABO/Rh     Status: None   Collection Time: 07/23/15  1:35 AM  Result Value Ref Range   ABO/RH(D) AB POS   POC occult blood, ED     Status: Abnormal   Collection Time: 07/23/15  2:48 AM  Result Value Ref Range   Fecal Occult Bld POSITIVE (A) NEGATIVE  I-Stat CG4 Lactic Acid, ED     Status: None   Collection Time: 07/23/15  2:49 AM  Result Value Ref Range   Lactic Acid, Venous 1.30 0.5 - 2.0 mmol/L  MRSA PCR Screening     Status: None   Collection Time: 07/23/15  5:35 AM  Result Value Ref Range   MRSA by PCR NEGATIVE NEGATIVE    Comment:        The GeneXpert MRSA Assay (FDA approved for NASAL specimens only), is one component of a comprehensive MRSA colonization surveillance program. It is not intended to diagnose MRSA infection nor to guide or monitor treatment for MRSA infections.   Hemoglobin and hematocrit, blood     Status: Abnormal   Collection Time: 07/23/15  8:00 AM  Result Value Ref Range   Hemoglobin 11.3 (L) 13.0 - 17.0 g/dL   HCT 32.0 (L) 39.0 - 52.0 %  Glucose, capillary     Status: Abnormal   Collection Time: 07/23/15 11:52 AM  Result Value Ref Range   Glucose-Capillary 160 (H) 65 - 99 mg/dL  Prepare RBC     Status: None   Collection Time: 07/23/15 12:51 PM  Result Value Ref Range   Order Confirmation ORDER PROCESSED BY BLOOD BANK   Hemoglobin and hematocrit, blood     Status: Abnormal   Collection Time: 07/23/15  1:55 PM  Result Value Ref Range   Hemoglobin 10.5 (L) 13.0 - 17.0 g/dL   HCT 30.1 (L) 39.0 - 52.0 %  Glucose, capillary      Status: Abnormal   Collection Time: 07/23/15  3:08 PM  Result Value Ref Range   Glucose-Capillary 108 (H) 65 - 99 mg/dL  Hemoglobin and hematocrit, blood     Status: Abnormal   Collection Time: 07/23/15  8:12 PM  Result Value Ref Range   Hemoglobin 10.8 (L) 13.0 - 17.0 g/dL   HCT 30.9 (L) 39.0 - 52.0 %  Glucose, capillary     Status: Abnormal   Collection Time: 07/23/15  8:20 PM  Result Value Ref Range   Glucose-Capillary 113 (H) 65 - 99 mg/dL  Glucose, capillary     Status: Abnormal   Collection Time: 07/23/15 11:43 PM  Result Value Ref Range   Glucose-Capillary 138 (H) 65 - 99 mg/dL  Hemoglobin and hematocrit, blood     Status: Abnormal   Collection Time: 07/24/15 12:09 AM  Result Value Ref Range   Hemoglobin 9.8 (L) 13.0 - 17.0 g/dL   HCT 27.7 (L) 39.0 - 52.0 %  Hemoglobin and hematocrit, blood     Status: Abnormal   Collection Time: 07/24/15  3:40 AM  Result Value Ref Range   Hemoglobin 9.4 (L) 13.0 - 17.0 g/dL   HCT 27.2 (L) 39.0 - 52.0 %  Glucose, capillary  Status: None   Collection Time: 07/24/15  5:10 AM  Result Value Ref Range   Glucose-Capillary 78 65 - 99 mg/dL  Glucose, capillary     Status: Abnormal   Collection Time: 07/24/15  6:57 AM  Result Value Ref Range   Glucose-Capillary 61 (L) 65 - 99 mg/dL  Glucose, capillary     Status: None   Collection Time: 07/24/15  7:23 AM  Result Value Ref Range   Glucose-Capillary 92 65 - 99 mg/dL   No results found.     Assessment/Plan 1. LGI bleed, likely diverticular in origin -patient currently not bleeding.  Last BM before his colonoscopy.  Fresh blood was noted on colonoscopy, but no definitely on spot to confirm localization.  There was no blood seen in the right colon, making it likely this is a diverticular bleed originating somewhere in the transverse or left colon.  We will follow for right now, but no acute surgical indications.  If he rebleeds, he could undergo repeat c-scope to eval for clipping, IR for  angioembolization, and lastly surgical intervention. -agree with clear liquids. -will follow.  Leeland Lovelady E 07/24/2015, 10:27 AM Pager: [phone]

## 2015-07-24 NOTE — Op Note (Signed)
[facility] [place], [id number]   COLONOSCOPY PROCEDURE REPORT     EXAM DATE: July 31, 2015  PATIENT NAME:      Chad Fuller, Chad Fuller           MR #:      [id number]  BIRTHDATE:       1926-11-13      VISIT #:     [id number]  ATTENDING:     Laurence Spates, MD     STATUS:     outpatient ASSISTANT:      Jiles Harold and Violeta Gelinas  INDICATIONS:  The patient is a 81 yr old male here for a colonoscopy due to acute lower G.I.  bleeding. PROCEDURE PERFORMED:     Colonoscopy, diagnostic MEDICATIONS:     Fentanyl 125 mcg IV and Versed 7 mg IV for both procedures ESTIMATED BLOOD LOSS:     None  CONSENT: The patient understands the risks and benefits of the procedure and understands that these risks include, but are not limited to: sedation, allergic reaction, infection, perforation and/or bleeding. Alternative means of evaluation and treatment include, among others: physical exam, x-rays, and/or surgical intervention. The patient elects to proceed with this endoscopic procedure.  DESCRIPTION OF PROCEDURE: During intra-op preparation period all mechanical & medical equipment was checked for proper function. Hand hygiene and appropriate measures for infection prevention was taken. After the risks, benefits and alternatives of the procedure were thoroughly explained, Informed consent was verified, confirmed and timeout was successfully executed by the treatment team. A digital exam    The adult colonoscope was inserted and were able to advance to the sigmoid colon but were unable to advance any further. We then switched over to the ultrathin colonoscope and were able to reach the cecum.      endoscope was introduced through the anus and advanced to the cecum, which was identified by the ileocecal valve. poor. The instrument was then slowly withdrawn as the colon was fully examined.Estimated blood loss is zero unless otherwise noted in this  procedure report.   COLON FINDINGS: Cecum and right colon essentially free of any signs of bleeding.   Small amount of blood in the transverse and proximal left colon.   Large amount of bright red blood throughout the distal left colon, sigmoid colon and rectum with extensive diverticular disease.  No clear bleeding site was saying.    The scope was then completely withdrawn from the patient and the procedure terminated. SCOPE WITHDRAWAL TIME:    ADVERSE EVENTS:      There were no immediate complications.  IMPRESSIONS:     1.  Cecum and right colon essentially free of any signs of bleeding 2.  Small amount of blood in the transverse and proximal left colon 3.  Large amount of bright red blood throughout the distal left colon, sigmoid colon and rectum with extensive diverticular disease.  No clear bleeding site was saying 4.   endoscopic findings suggest acute bleeding from the left colon probably diverticular hemorrhage  RECOMMENDATIONS:     Will keep on clear liquids and Miralax for now and suggest surgical consultation.  Possible bleeding scan/angiogram needed  will go ahead and perform EGD just to be sure no upper G.I. source. RECALL:  _____________________________ Laurence Spates, MD eSigned:  Laurence Spates, MD July 31, 2015 9:32 AM   cc:   CPT CODES: ICD CODES:  The ICD and CPT codes recommended by this software are interpretations from the data that  the clinical staff has captured with the software.  The verification of the translation of this report to the ICD and CPT codes and modifiers is the sole responsibility of the health care institution and practicing physician where this report was generated.  [employer] will not be held responsible for the validity of the ICD and CPT codes included on this report.  AMA assumes no liability for data contained or not contained herein. C[employer] is a Designer, television/film set of the [employer].   PATIENT NAME:  Chad Fuller, Chad Fuller MR#: [id number]

## 2015-07-24 NOTE — OR Nursing (Signed)
Colon completed; colon scope switched out to slim scope because of tight colon.  no identified source of bleed. EGD for follow up ID of source; consent obtained from family

## 2015-07-24 NOTE — H&P (View-Only) (Signed)
[facility] CONSULT Reason for consult: G.I. bleeding Referring Physician: [facility]. PCP: Dr. Felipa Eth.  Chad Fuller is an 81 y.o. male.  HPI: the patient has a history of pulmonary embolus many years ago as well as a history of lung cancer. He has type II diabetes. He has never had colon problems and denies any knowledge of prior diverticular disease or G.I. bleeding. His last colonoscopy was many years ago he thinks 15 to 20 years ago and was done here at the [facility]. Neither he nor his daughters who were with him today remember any prior history of diverticular disease. He denies constipation and is not really take anything to prevent constipation. Yesterday evening he had sudden onset of loose stools followed by bright red blood per rectum. There were several episodes of this and he came to the emergency room. His systolic pressure initially was in the 70s and improve rapidly with IV fluids. He had no further bleeding during the day until this afternoon he began to pass bright red blood again. His blood pressure has been stable with systolic pressure is 614 to 130 this p.m. His initial hemoglobin was 13.4 dropping overnight to 11.3 in recent stat hemoglobin was 10.5. The patient's BUN in creatinine on admission were completely normal. He denies indigestion or heartburn. He denies any abdominal pain. He denies any blood thinners and the only blood thinner that he appears to have been taken as an outpatient is aspirin. No known family history colon cancer.  Past Medical History  Diagnosis Date  . Diabetes mellitus   . Cancer     lung  . Prostate enlargement   . History of blood clots   . CAD (coronary artery disease)   . Pulmonary embolus july 2005    Past Surgical History  Procedure Laterality Date  . Lung removal, partial    . Coronary stent placement    . Knee surgery    . Filtering procedure      reports filter placed after knee surgery for blood  clots  . I&d extremity  08/16/2012    Procedure: MINOR IRRIGATION AND DEBRIDEMENT EXTREMITY;  Surgeon: Tennis Must, MD;  Location: [facility];  Service: Orthopedics;  Laterality: Right;    History reviewed. No pertinent family history.  Social History:  reports that he quit smoking about 41 years ago. His smoking use included Cigarettes. He does not have any smokeless tobacco history on file. He reports that he does not drink alcohol or use illicit drugs.  Allergies:  Allergies  Allergen Reactions  . Protonix [Pantoprazole Sodium] Diarrhea    Medications; Prior to Admission medications   Medication Sig Start Date End Date Taking? Authorizing Provider  Aspirin-Salicylamide-Caffeine (BC HEADACHE) 325-95-16 MG TABS Take 1 packet by mouth every morning.   Yes Historical Provider, MD  ezetimibe (ZETIA) 10 MG tablet Take 10 mg by mouth daily.   Yes Historical Provider, MD  GLUCOSAMINE-CHONDROITIN PO Take 2 tablets by mouth every morning.   Yes Historical Provider, MD  insulin glargine (LANTUS) 100 UNIT/ML injection Inject 0.5 mLs (50 Units total) into the skin daily. Patient taking differently: Inject 70-90 Units into the skin every morning.  12/04/14  Yes Ivan Anchors Love, PA-C  losartan-hydrochlorothiazide (HYZAAR) 50-12.5 MG per tablet Take 1 tablet by mouth daily.   Yes Historical Provider, MD  meloxicam (MOBIC) 7.5 MG tablet Take 7.5 mg by mouth daily.   Yes Historical Provider, MD  metFORMIN (GLUCOPHAGE-XR) 500 MG 24  hr tablet Take 500 mg with breakfast and 1000 mg with supper Patient taking differently: Take 500 mg by mouth 2 (two) times daily.  12/04/14  Yes Pamela S Love, PA-C  Multiple Vitamins-Minerals (MULTIVITAMINS THER. W/MINERALS) TABS Take 1 tablet by mouth daily.     Yes Historical Provider, MD  pantoprazole (PROTONIX) 40 MG tablet Take 1 tablet (40 mg total) by mouth daily. 12/04/14  Yes Pamela S Love, PA-C  tamsulosin (FLOMAX) 0.4 MG CAPS capsule Take 0.4 mg by  mouth daily.  09/02/13  Yes Historical Provider, MD   . sodium chloride   Intravenous STAT  . sodium chloride   Intravenous Once  . ezetimibe  10 mg Oral Daily  . famotidine (PEPCID) IV  20 mg Intravenous Q12H  . insulin aspart  0-9 Units Subcutaneous 6 times per day  . polyethylene glycol-electrolytes  4,000 mL Oral Once  . sodium chloride  3 mL Intravenous Q12H   PRN Meds  Results for orders placed or performed during the hospital encounter of 07/23/15 (from the past 48 hour(s))  Comprehensive metabolic panel     Status: Abnormal   Collection Time: 07/23/15  1:35 AM  Result Value Ref Range   Sodium 136 135 - 145 mmol/L   Potassium 3.7 3.5 - 5.1 mmol/L   Chloride 98 (L) 101 - 111 mmol/L   CO2 30 22 - 32 mmol/L   Glucose, Bld 201 (H) 65 - 99 mg/dL   BUN 13 6 - 20 mg/dL   Creatinine, Ser 0.71 0.61 - 1.24 mg/dL   Calcium 8.8 (L) 8.9 - 10.3 mg/dL   Total Protein 5.8 (L) 6.5 - 8.1 g/dL   Albumin 3.5 3.5 - 5.0 g/dL   AST 21 15 - 41 U/L   ALT 17 17 - 63 U/L   Alkaline Phosphatase 92 38 - 126 U/L   Total Bilirubin 0.7 0.3 - 1.2 mg/dL   GFR calc non Af Amer >60 >60 mL/min   GFR calc Af Amer >60 >60 mL/min    Comment: (NOTE) The eGFR has been calculated using the CKD EPI equation. This calculation has not been validated in all clinical situations. eGFR's persistently <60 mL/min signify possible Chronic Kidney Disease.    Anion gap 8 5 - 15  CBC     Status: Abnormal   Collection Time: 07/23/15  1:35 AM  Result Value Ref Range   WBC 6.6 4.0 - 10.5 K/uL   RBC 4.18 (L) 4.22 - 5.81 MIL/uL   Hemoglobin 13.4 13.0 - 17.0 g/dL   HCT 37.6 (L) 39.0 - 52.0 %   MCV 90.0 78.0 - 100.0 fL   MCH 32.1 26.0 - 34.0 pg   MCHC 35.6 30.0 - 36.0 g/dL   RDW 13.0 11.5 - 15.5 %   Platelets 216 150 - 400 K/uL  Type and screen     Status: None (Preliminary result)   Collection Time: 07/23/15  1:35 AM  Result Value Ref Range   ABO/RH(D) AB POS    Antibody Screen NEG    Sample Expiration 07/26/2015     Unit Number [id number]    Blood Component Type RED CELLS,LR    Unit division 00    Status of Unit ISSUED    Transfusion Status OK TO TRANSFUSE    Crossmatch Result Compatible   Differential     Status: None   Collection Time: 07/23/15  1:35 AM  Result Value Ref Range   Neutrophils Relative % 62 43 - 77 %     Neutro Abs 4.1 1.7 - 7.7 K/uL   Lymphocytes Relative 27 12 - 46 %   Lymphs Abs 1.8 0.7 - 4.0 K/uL   Monocytes Relative 8 3 - 12 %   Monocytes Absolute 0.5 0.1 - 1.0 K/uL   Eosinophils Relative 3 0 - 5 %   Eosinophils Absolute 0.2 0.0 - 0.7 K/uL   Basophils Relative 0 0 - 1 %   Basophils Absolute 0.0 0.0 - 0.1 K/uL  ABO/Rh     Status: None   Collection Time: 07/23/15  1:35 AM  Result Value Ref Range   ABO/RH(D) AB POS   POC occult blood, ED     Status: Abnormal   Collection Time: 07/23/15  2:48 AM  Result Value Ref Range   Fecal Occult Bld POSITIVE (A) NEGATIVE  I-Stat CG4 Lactic Acid, ED     Status: None   Collection Time: 07/23/15  2:49 AM  Result Value Ref Range   Lactic Acid, Venous 1.30 0.5 - 2.0 mmol/L  MRSA PCR Screening     Status: None   Collection Time: 07/23/15  5:35 AM  Result Value Ref Range   MRSA by PCR NEGATIVE NEGATIVE    Comment:        The GeneXpert MRSA Assay (FDA approved for NASAL specimens only), is one component of a comprehensive MRSA colonization surveillance program. It is not intended to diagnose MRSA infection nor to guide or monitor treatment for MRSA infections.   Hemoglobin and hematocrit, blood     Status: Abnormal   Collection Time: 07/23/15  8:00 AM  Result Value Ref Range   Hemoglobin 11.3 (L) 13.0 - 17.0 g/dL   HCT 32.0 (L) 39.0 - 52.0 %  Glucose, capillary     Status: Abnormal   Collection Time: 07/23/15 11:52 AM  Result Value Ref Range   Glucose-Capillary 160 (H) 65 - 99 mg/dL  Prepare RBC     Status: None   Collection Time: 07/23/15 12:51 PM  Result Value Ref Range   Order Confirmation ORDER PROCESSED BY BLOOD BANK    Hemoglobin and hematocrit, blood     Status: Abnormal   Collection Time: 07/23/15  1:55 PM  Result Value Ref Range   Hemoglobin 10.5 (L) 13.0 - 17.0 g/dL   HCT 30.1 (L) 39.0 - 52.0 %  Glucose, capillary     Status: Abnormal   Collection Time: 07/23/15  3:08 PM  Result Value Ref Range   Glucose-Capillary 108 (H) 65 - 99 mg/dL    No results found.             Blood pressure 110/61, pulse 83, temperature 97.7 F (36.5 C), temperature source Oral, resp. rate 19, height 6' 2" (1.88 m), weight 99.9 kg (220 lb 3.8 oz), SpO2 97 %.  Physical exam:   General-- pleasant white male sitting on porta potty. Pass darkish maroon stool. ENT-- sclera nonicteric Neck-- no lymphadenopathy Heart-- regular rate and rhythm without murmurs or gallops Lungs-- clear Abdomen-- software completely nontender Psych-- alert and oriented and answers questions appropriately memory appears quite good   Assessment: 1. Painless hematochezia. This is very likely lower G.I. bleed due to diverticulosis. Patients BUN and creatinine are normal and he is having no pain. Fortunately he is not really dropped his hemoglobin that much in his blood pressure now stable. I do think we should plan a colonoscopy as soon as he can be adequately cleaned out. 2. Type II diabetes   Plan:.  1. Start patient  on clear liquids. I agree with given him IV Pepcid. He apparently is intolerant of Protonix. 2. We will plan on colonoscopy in the morning at 8 AM. Long discussion with the patient and his 2 daughters that it is possible that diverticular bleeding will not stop and that he could need urgent angiogram or even surgery. All of this is been discussed in the orders for colonoscopy prep have been written.   Rejina Odle JR,Alaycia Eardley L 07/23/2015, 4:41 PM   Pager: [phone] If no answer or after hours call [phone]

## 2015-07-24 NOTE — Op Note (Addendum)
[facility] [place], [address]   ENDOSCOPY PROCEDURE REPORT  PATIENT: Chad Fuller, Chad Fuller  MR#: [id number] BIRTHDATE: [birth year] , 88  yrs. old GENDER: male ENDOSCOPIST:Gunhild Bautch Oletta Lamas, MD REFERRED JF:[id number] hospitalist PROCEDURE DATE:  07-29-15 PROCEDURE: ASA CLASS:  class IV INDICATIONS: G.I. bleeding MEDICATION:see colonoscopy note TOPICAL ANESTHETIC:  none  DESCRIPTION OF PROCEDURE:   After the risks and benefits of the procedure were explained, informed consent was obtained.  The following the colonoscopy we decided to go ahead with the GD consent was obtained from the family. Pediatric endoscope was inserted swallowing and advanced into the stomach. The duodenum was in.       endoscope was introduced through the mouth  and advanced to the second portion of the duodenum .  The instrument was slowly withdrawn as the mucosa was fully examined. Estimated blood loss is zero unless otherwise noted in this procedure report.    Esophagus normal no bleeding.   Stomach normal no bleeding. Duodenum normal no bleeding.          The scope was then withdrawn from the patient and the procedure completed.  COMPLICATIONS: There were no immediate complications.  ENDOSCOPIC IMPRESSION:  no signs of upper G.I. bleeding RECOMMENDATIONS: Miralax clear liquids and follow clinically   _______________________________ eSignedLaurence Spates, MD Jul 29, 2015 9:47 AM Revised: 07/29/15 9:47 AM    cc:  CPT CODES: ICD CODES:  The ICD and CPT codes recommended by this software are interpretations from the data that the clinical staff has captured with the software.  The verification of the translation of this report to the ICD and CPT codes and modifiers is the sole responsibility of the health care institution and practicing physician where this report was generated.  [employer] will not be held responsible for the validity of  the ICD and CPT codes included on this report.  AMA assumes no liability for data contained or not contained herein. CPT is a Designer, television/film set of the [employer].  PATIENT NAME:  Brandis, Matsuura MR#: [id number]

## 2015-07-24 NOTE — Progress Notes (Signed)
PROGRESS NOTE  ARUN HERROD VEH:[id number] DOB: 05/13/1926 DOA: 07/23/2015 PCP: Mathews Argyle, MD   HPI: Chad Fuller is a 81 y.o. male who presents to the ED with multiple episodes of BRBPR onset tonight. He has 4-5 episodes since initial onset at time of presentation. He has another episode in the ED followed by an episode of hypotension with SBPs in the 70s. BP improved with IVF bolus. No anticoagulation, no abdominal pain or other symptoms.  Subjective / 24 H Interval events - doing well post colonoscopy and EGD, he is hungry - no further bloody BMs  Assessment/Plan: Principal Problem:   Rectal bleeding Active Problems:   Type 2 diabetes mellitus   GI bleed   Hypotension    GI bleed, hematochezia:  - IV fluids.  - s/p colonoscopy and EGD 7/27, EGD without bleeding and colonoscopy with large amount of bright red blood throughout the distal left colon, sigmoid colon and rectum with extensive diverticular disease, without clear active bleeding site> I discussed with Dr. Oletta Lamas over the phone - continue to monitor - consulted and discussed with general surgery as well  Hypotension:  - In setting of GI bleed.  - BP now stable.  - Improved with IV fluids.  - Cycle hb.   Diabetes:  - hold lantus, hypoglycemic this morning - Will order SSI.   HTN; hold diuretic in setting of GI bleed and hypotension.   Dizziness;  - He report dizziness started back 4 weeks ago. He initially had dizziness with his stroke but resolved.  -Will need evaluation if dizziness persist after GI and BP stable.    Diet: Diet clear liquid Room service appropriate?: Yes; Fluid consistency:: Thin Fluids: none DVT Prophylaxis: SCD  Code Status: Full Code Family Communication: d/w family bedside  Disposition Plan: remain in SDU  Consultants:  GI  General surgery   Procedures:  Colonoscopy and EGD 7/27   Antibiotics  Anti-infectives    None       Studies  No  results found.  Objective  Filed Vitals:   07/24/15 1045 07/24/15 1050 07/24/15 1055 07/24/15 1100  BP:      Pulse: 81 78 78 78  Temp:      TempSrc:      Resp: '17 20 19 19  '$ Height:      Weight:      SpO2: 98% 97% 98% 98%    Intake/Output Summary (Last 24 hours) at 07/24/15 1220 Last data filed at 07/24/15 0400  Gross per 24 hour  Intake   1178 ml  Output      0 ml  Net   1178 ml   Filed Weights   07/23/15 0449  Weight: 99.9 kg (220 lb 3.8 oz)   Exam:  General:  NAD, pleasant  HEENT: no scleral icterus, PERRL  Cardiovascular: RRR without MRG, 2+ peripheral pulses, no edema  Respiratory: CTA biL, good air movement, no wheezing, no crackles, no rales  Abdomen: soft, non tender, BS +, no guarding  MSK/Extremities: no clubbing/cyanosis, no joint swelling  Skin: no rashes  Neuro: non focal  Data Reviewed: Basic Metabolic Panel:  Recent Labs Lab 07/23/15 0135  NA 136  K 3.7  CL 98*  CO2 30  GLUCOSE 201*  BUN 13  CREATININE 0.71  CALCIUM 8.8*   Liver Function Tests:  Recent Labs Lab 07/23/15 0135  AST 21  ALT 17  ALKPHOS 92  BILITOT 0.7  PROT 5.8*  ALBUMIN 3.5   CBC:  Recent Labs Lab 07/23/15 0135  07/23/15 1355 07/23/15 2012 07/24/15 0009 07/24/15 0340 07/24/15 1045  WBC 6.6  --   --   --   --   --   --   NEUTROABS 4.1  --   --   --   --   --   --   HGB 13.4  < > 10.5* 10.8* 9.8* 9.4* 8.6*  HCT 37.6*  < > 30.1* 30.9* 27.7* 27.2* 24.5*  MCV 90.0  --   --   --   --   --   --   PLT 216  --   --   --   --   --   --   < > = values in this interval not displayed.  CBG:  Recent Labs Lab 07/23/15 2020 07/23/15 2343 07/24/15 0510 07/24/15 0657 07/24/15 0723  GLUCAP 113* 138* 78 61* 92    Recent Results (from the past 240 hour(s))  MRSA PCR Screening     Status: None   Collection Time: 07/23/15  5:35 AM  Result Value Ref Range Status   MRSA by PCR NEGATIVE NEGATIVE Final    Comment:        The GeneXpert MRSA Assay  (FDA approved for NASAL specimens only), is one component of a comprehensive MRSA colonization surveillance program. It is not intended to diagnose MRSA infection nor to guide or monitor treatment for MRSA infections.      Scheduled Meds: . ezetimibe  10 mg Oral Daily  . famotidine (PEPCID) IV  20 mg Intravenous Q12H  . insulin aspart  0-9 Units Subcutaneous 6 times per day  . polyethylene glycol  17 g Oral Daily  . sodium chloride  3 mL Intravenous Q12H   Continuous Infusions: . sodium chloride 1,000 mL (07/24/15 0411)   Time spent: 25 minutes  Marzetta Board, MD [facility] Pager [phone]. If 7 PM - 7 AM, please contact night-coverage at [email or web address], password [facility] 07/24/2015, 12:20 PM  LOS: 1 day

## 2015-07-24 NOTE — Progress Notes (Signed)
CBG rechecked after primary are nurse given D50; new CBG is 92

## 2015-07-24 NOTE — Interval H&P Note (Signed)
History and Physical Interval Note:  07/24/2015 8:06 AM  Chad Fuller  has presented today for surgery, with the diagnosis of lower gib  The various methods of treatment have been discussed with the patient and family. After consideration of risks, benefits and other options for treatment, the patient has consented to  Procedure(s): COLONOSCOPY (N/A) as a surgical intervention .  The patient's history has been reviewed, patient examined, no change in status, stable for surgery.  I have reviewed the patient's chart and labs.  Questions were answered to the patient's satisfaction.     Chad Fuller,Bryten Maher L

## 2015-07-25 ENCOUNTER — Inpatient Hospital Stay (HOSPITAL_COMMUNITY): Payer: Medicare Other

## 2015-07-25 ENCOUNTER — Encounter (HOSPITAL_COMMUNITY): Payer: Self-pay | Admitting: Gastroenterology

## 2015-07-25 DIAGNOSIS — D62 Acute posthemorrhagic anemia: Secondary | ICD-10-CM

## 2015-07-25 LAB — HEMOGLOBIN AND HEMATOCRIT, BLOOD
HCT: 24.5 % — ABNORMAL LOW (ref 39.0–52.0)
HEMATOCRIT: 22.9 % — AB (ref 39.0–52.0)
HEMATOCRIT: 22.9 % — AB (ref 39.0–52.0)
HEMATOCRIT: 21.9 % — AB (ref 39.0–52.0)
HEMOGLOBIN: 8.1 g/dL — AB (ref 13.0–17.0)
Hemoglobin: 8.1 g/dL — ABNORMAL LOW (ref 13.0–17.0)
Hemoglobin: 7.7 g/dL — ABNORMAL LOW (ref 13.0–17.0)
Hemoglobin: 8.5 g/dL — ABNORMAL LOW (ref 13.0–17.0)

## 2015-07-25 LAB — GLUCOSE, CAPILLARY
GLUCOSE-CAPILLARY: 157 mg/dL — AB (ref 65–99)
GLUCOSE-CAPILLARY: 243 mg/dL — AB (ref 65–99)
Glucose-Capillary: 115 mg/dL — ABNORMAL HIGH (ref 65–99)
Glucose-Capillary: 94 mg/dL (ref 65–99)
Glucose-Capillary: 169 mg/dL — ABNORMAL HIGH (ref 65–99)
Glucose-Capillary: 167 mg/dL — ABNORMAL HIGH (ref 65–99)
Glucose-Capillary: 190 mg/dL — ABNORMAL HIGH (ref 65–99)

## 2015-07-25 LAB — TYPE AND SCREEN
ABO/RH(D): AB POS
Antibody Screen: NEGATIVE
UNIT DIVISION: 0
Unit division: 0

## 2015-07-25 MED ORDER — TECHNETIUM TC 99M-LABELED RED BLOOD CELLS IV KIT
25.0000 | PACK | Freq: Once | INTRAVENOUS | Status: AC | PRN
  Administered 2015-07-25: 25 via INTRAVENOUS

## 2015-07-25 NOTE — Progress Notes (Signed)
[facility] Progress Note  Subjective: Patient currently urinating. He has not had any stool today. Had a significant gush of bloody stool after his colonoscopy yesterday afternoon and then only 2 small smears the rest of the day.  Objective: Vital signs in last 24 hours: Temp:  [97.6 F (36.4 C)-98.6 F (37 C)] 98.6 F (37 C) (07/28 0714) Pulse Rate:  [78-110] 95 (07/28 0714) Resp:  [13-27] 15 (07/28 0714) BP: (113-157)/(52-97) 137/64 mmHg (07/28 0714) SpO2:  [95 %-100 %] 98 % (07/28 0714) Weight change:    PE: Third and oriented no acute distress  Lab Results: Results for orders placed or performed during the hospital encounter of 07/23/15 (from the past 24 hour(s))  Hemoglobin and hematocrit, blood     Status: Abnormal   Collection Time: 07/24/15 10:45 AM  Result Value Ref Range   Hemoglobin 8.6 (L) 13.0 - 17.0 g/dL   HCT 24.5 (L) 39.0 - 52.0 %  Glucose, capillary     Status: Abnormal   Collection Time: 07/24/15 12:30 PM  Result Value Ref Range   Glucose-Capillary 118 (H) 65 - 99 mg/dL  Hemoglobin and hematocrit, blood     Status: Abnormal   Collection Time: 07/24/15  4:00 PM  Result Value Ref Range   Hemoglobin 7.8 (L) 13.0 - 17.0 g/dL   HCT 22.0 (L) 39.0 - 52.0 %  Prepare RBC     Status: None   Collection Time: 07/24/15  5:45 PM  Result Value Ref Range   Order Confirmation ORDER PROCESSED BY BLOOD BANK   Glucose, capillary     Status: Abnormal   Collection Time: 07/24/15  5:46 PM  Result Value Ref Range   Glucose-Capillary 126 (H) 65 - 99 mg/dL  Glucose, capillary     Status: Abnormal   Collection Time: 07/24/15  8:42 PM  Result Value Ref Range   Glucose-Capillary 209 (H) 65 - 99 mg/dL  Hemoglobin and hematocrit, blood     Status: Abnormal   Collection Time: 07/24/15 10:42 PM  Result Value Ref Range   Hemoglobin 8.3 (L) 13.0 - 17.0 g/dL   HCT 22.9 (L) 39.0 - 52.0 %  Glucose, capillary     Status: Abnormal   Collection Time: 07/24/15 11:57 PM   Result Value Ref Range   Glucose-Capillary 157 (H) 65 - 99 mg/dL  Hemoglobin and hematocrit, blood     Status: Abnormal   Collection Time: 07/25/15  3:24 AM  Result Value Ref Range   Hemoglobin 8.1 (L) 13.0 - 17.0 g/dL   HCT 22.9 (L) 39.0 - 52.0 %  Glucose, capillary     Status: Abnormal   Collection Time: 07/25/15  3:29 AM  Result Value Ref Range   Glucose-Capillary 115 (H) 65 - 99 mg/dL  Hemoglobin and hematocrit, blood     Status: Abnormal   Collection Time: 07/25/15  7:27 AM  Result Value Ref Range   Hemoglobin 8.1 (L) 13.0 - 17.0 g/dL   HCT 22.9 (L) 39.0 - 52.0 %  Glucose, capillary     Status: None   Collection Time: 07/25/15  8:20 AM  Result Value Ref Range   Glucose-Capillary 94 65 - 99 mg/dL   Comment 1 Notify RN    Comment 2 Document in Chart     Studies/Results: No results found.    Assessment: GI bleeding, source uncertain but EGD colonoscopy suggest colonic source.  Plan: Continue to monitor stools and hemoglobin. If bleeding persists next step probably be pooled RBC scan  followed by angiogram if positive.    Cerissa Zeiger C 07/25/2015, 10:01 AM  Pager [phone] If no answer or after 5 PM call [phone]

## 2015-07-25 NOTE — Progress Notes (Signed)
Patient had x1 small bloody stool at ~1020. Patient still complains of some lightheadedness and generalized weakness, alert and oriented x4.

## 2015-07-25 NOTE — Progress Notes (Signed)
Pt has had 2 large bloody BMs tonight, MD o/c(Kirby) aware. Also called and updated with Hemoglobin results. Pt only complaint is generalized weakness, otherwise resting with no complaints. Vitals remain stable, will continue monitoring

## 2015-07-25 NOTE — Progress Notes (Addendum)
PROGRESS NOTE  Chad Fuller BJS:[id number] DOB: 11/22/1926 DOA: 07/23/2015 PCP: Mathews Argyle, MD   HPI: Chad Fuller is a 81 y.o. male who presented to the ED with multiple episodes of BRBPR (bright red blood per rectum) onset. He had 4-5 episodes since initial onset at time of presentation. He had another episode in the ED followed by an episode of hypotension with SBPs in the 70s. BP improved with IVF bolus. No anticoagulation, no abdominal pain or other symptoms.  Subjective / 24 H Interval events Patient is feeling well but having bloody bowel movements (1 last night, 1 this am). Nuclear scan ordered. Denies abdominal pain, tenesmus, dizziness. No fever.  Assessment/Plan: Principal Problem:   Rectal bleeding Active Problems:   Type 2 diabetes mellitus   GI bleed   Hypotension  GI bleed, hematochezia: - 2 episodes of bloody bowel movements since colonoscopy, hb is 8.1 and slowly trending down - IV fluids stopped due to pedal edema - s/p colonoscopy and EGD 7/27, EGD without bleeding and colonoscopy with large amount of bright red blood throughout the distal left colon, sigmoid colon and rectum with extensive diverticular disease, without clear active bleeding site.  - GI consulted, discussed today with Drs. Edwards and Avon - after a bloody BM this morning nuclear scan ordered, unfortunately negative. Images personally reviewed - continue to monitor closely - check hb in am  Hypotension:  - in setting of GI bleed - BP continues to be stable  - Cycle hb  Diabetes: - holding lantus, CBG 169 this am - SSI novolog  HTN: - continue to hold diuretic in setting of GI bleed  Dizziness: - He report dizziness started back 4 weeks ago. He initially had dizziness with his stroke but resolved.  - Will need evaluation if dizziness persist after GI and BP stable.   Diet: Diet clear liquid Room service appropriate?: Yes; Fluid consistency:: Thin Fluids: None DVT  Prophylaxis: SCDs  Code Status: Full Code Family Communication: no family at bedside Disposition Plan: remain in SDU  Consultants:  GI  General surgery  Procedures:  Colonoscopy and EGD 7/27   Antibiotics  Anti-infectives    None      Studies No results found.  Objective  Filed Vitals:   07/25/15 0000 07/25/15 0320 07/25/15 0714 07/25/15 1142  BP: 124/58 113/52 137/64   Pulse: 94 94 95   Temp:  98.4 F (36.9 C) 98.6 F (37 C) 98.2 F (36.8 C)  TempSrc:  Oral Oral Oral  Resp: '16 27 15   '$ Height:      Weight:      SpO2: 97% 97% 98%     Intake/Output Summary (Last 24 hours) at 07/25/15 1237 Last data filed at 07/25/15 1155  Gross per 24 hour  Intake   2425 ml  Output   2250 ml  Net    175 ml   Filed Weights   07/23/15 0449  Weight: 99.9 kg (220 lb 3.8 oz)   Exam:  General:  Patient is pleasant, lying supine in bed, in no acute distress.  HEENT: Normocephalic, atraumatic, PERRL, conjunctivae normal  Cardiovascular: RRR without m/r/g, S1 and S2 normal  Respiratory: CTA bilaterally, good air movement, no wheezing, no crackles, no rales  Abdomen: soft, non-tender, non-distended, +BS, no guarding  MSK/Extremities: No clubbing/cyanosis, +1 pedal edema  Skin: no rashes  Neuro: non-focal  Data Reviewed: Basic Metabolic Panel:  Recent Labs Lab 07/23/15 0135  NA 136  K 3.7  CL  98*  CO2 30  GLUCOSE 201*  BUN 13  CREATININE 0.71  CALCIUM 8.8*   Liver Function Tests:  Recent Labs Lab 07/23/15 0135  AST 21  ALT 17  ALKPHOS 92  BILITOT 0.7  PROT 5.8*  ALBUMIN 3.5   CBC:  Recent Labs Lab 07/23/15 0135  07/24/15 1600 07/24/15 2242 07/25/15 0324 07/25/15 0727 07/25/15 1345  WBC 6.6  --   --   --   --   --   --   NEUTROABS 4.1  --   --   --   --   --   --   HGB 13.4  < > 7.8* 8.3* 8.1* 8.1* 7.7*  HCT 37.6*  < > 22.0* 22.9* 22.9* 22.9* 21.9*  MCV 90.0  --   --   --   --   --   --   PLT 216  --   --   --   --   --   --   < > =  values in this interval not displayed.  CBG:  Recent Labs Lab 07/24/15 2042 07/24/15 2357 07/25/15 0329 07/25/15 0820 07/25/15 1112  GLUCAP 209* 157* 115* 94 169*    Recent Results (from the past 240 hour(s))  MRSA PCR Screening     Status: None   Collection Time: 07/23/15  5:35 AM  Result Value Ref Range Status   MRSA by PCR NEGATIVE NEGATIVE Final    Comment:        The GeneXpert MRSA Assay (FDA approved for NASAL specimens only), is one component of a comprehensive MRSA colonization surveillance program. It is not intended to diagnose MRSA infection nor to guide or monitor treatment for MRSA infections.      Scheduled Meds: . ezetimibe  10 mg Oral Daily  . famotidine (PEPCID) IV  20 mg Intravenous Q12H  . insulin aspart  0-9 Units Subcutaneous 6 times per day  . polyethylene glycol  17 g Oral Daily  . sodium chloride  3 mL Intravenous Q12H   Time spent: 25 minutes  Lenda Kelp, PA-S  Marzetta Board, MD [facility] Pager [phone]. If 7 PM - 7 AM, please contact night-coverage at [email or web address], password [facility] 07/25/2015, 12:37 PM  LOS: 2 days

## 2015-07-25 NOTE — Progress Notes (Signed)
Patient ID: Chad Fuller, male   DOB: 11-08-1926, 81 y.o.   MRN: [id number] 1 Day Post-Op  Subjective: No pain.  Doesn't remember having 2 bloody BMs overnight.  States he hasn't had any this am.  Doesn't remember me from yesterday, ? Some dementia  Objective: Vital signs in last 24 hours: Temp:  [97.6 F (36.4 C)-98.6 F (37 C)] 98.6 F (37 C) (07/28 0714) Pulse Rate:  [78-110] 95 (07/28 0714) Resp:  [13-27] 15 (07/28 0714) BP: (113-214)/(52-99) 137/64 mmHg (07/28 0714) SpO2:  [95 %-100 %] 98 % (07/28 0714) Last BM Date: 07/25/15  Intake/Output from previous day: 07/27 0701 - 07/28 0700 In: 3025 [P.O.:1340; I.V.:1250; Blood:335; IV Piggyback:100] Out: 1350 [Urine:1350] Intake/Output this shift: Total I/O In: -  Out: 350 [Urine:350]  PE: Abd: soft, NT, Nd, +BS Heart: regular, but around 100 Lungs: CTAB  Lab Results:   Recent Labs  07/23/15 0135  07/25/15 0324 07/25/15 0727  WBC 6.6  --   --   --   HGB 13.4  < > 8.1* 8.1*  HCT 37.6*  < > 22.9* 22.9*  PLT 216  --   --   --   < > = values in this interval not displayed. BMET  Recent Labs  07/23/15 0135  NA 136  K 3.7  CL 98*  CO2 30  GLUCOSE 201*  BUN 13  CREATININE 0.71  CALCIUM 8.8*   PT/INR No results for input(s): LABPROT, INR in the last 72 hours. CMP     Component Value Date/Time   NA 136 07/23/2015 0135   NA 142 02/21/2014 0827   K 3.7 07/23/2015 0135   K 4.3 02/21/2014 0827   CL 98* 07/23/2015 0135   CL 101 02/22/2013 1415   CO2 30 07/23/2015 0135   CO2 31* 02/21/2014 0827   GLUCOSE 201* 07/23/2015 0135   GLUCOSE 159* 02/21/2014 0827   GLUCOSE 254* 02/22/2013 1415   BUN 13 07/23/2015 0135   BUN 13.8 02/21/2014 0827   CREATININE 0.71 07/23/2015 0135   CREATININE 0.8 02/21/2014 0827   CALCIUM 8.8* 07/23/2015 0135   CALCIUM 9.5 02/21/2014 0827   PROT 5.8* 07/23/2015 0135   PROT 6.8 02/21/2014 0827   ALBUMIN 3.5 07/23/2015 0135   ALBUMIN 3.7 02/21/2014 0827   AST 21 07/23/2015 0135    AST 22 02/21/2014 0827   ALT 17 07/23/2015 0135   ALT 17 02/21/2014 0827   ALKPHOS 92 07/23/2015 0135   ALKPHOS 93 02/21/2014 0827   BILITOT 0.7 07/23/2015 0135   BILITOT 0.79 02/21/2014 0827   GFRNONAA >60 07/23/2015 0135   GFRAA >60 07/23/2015 0135   Lipase  No results found for: LIPASE     Studies/Results: No results found.  Anti-infectives: Anti-infectives    None       Assessment/Plan  1. LGI bleed, likely diverticular in origin -hgb is slowly trending down.  8.1 this am, was 9.4 yesterday. -no other BMs yet this am.  Cont to watch.  If patient begins to actively bleed, then angio would be a good next stop if possible.  Would like to avoid surgery if possible, as it's possible this is from the left side, but no direct visualization of the actual site of bleeding.   LOS: 2 days    Chad Fuller E 07/25/2015, 8:37 AM Pager: [phone]

## 2015-07-26 LAB — BASIC METABOLIC PANEL
ANION GAP: 5 (ref 5–15)
BUN: 6 mg/dL (ref 6–20)
CALCIUM: 8 mg/dL — AB (ref 8.9–10.3)
CO2: 28 mmol/L (ref 22–32)
CREATININE: 0.71 mg/dL (ref 0.61–1.24)
Chloride: 103 mmol/L (ref 101–111)
Glucose, Bld: 182 mg/dL — ABNORMAL HIGH (ref 65–99)
Potassium: 3.6 mmol/L (ref 3.5–5.1)
Sodium: 136 mmol/L (ref 135–145)

## 2015-07-26 LAB — GLUCOSE, CAPILLARY
GLUCOSE-CAPILLARY: 153 mg/dL — AB (ref 65–99)
Glucose-Capillary: 133 mg/dL — ABNORMAL HIGH (ref 65–99)
Glucose-Capillary: 233 mg/dL — ABNORMAL HIGH (ref 65–99)
Glucose-Capillary: 267 mg/dL — ABNORMAL HIGH (ref 65–99)

## 2015-07-26 LAB — HEMOGLOBIN AND HEMATOCRIT, BLOOD
HCT: 22.9 % — ABNORMAL LOW (ref 39.0–52.0)
HCT: 22 % — ABNORMAL LOW (ref 39.0–52.0)
HCT: 22.2 % — ABNORMAL LOW (ref 39.0–52.0)
HEMATOCRIT: 23.4 % — AB (ref 39.0–52.0)
HEMOGLOBIN: 8.1 g/dL — AB (ref 13.0–17.0)
HEMOGLOBIN: 8.2 g/dL — AB (ref 13.0–17.0)
Hemoglobin: 7.8 g/dL — ABNORMAL LOW (ref 13.0–17.0)
Hemoglobin: 7.8 g/dL — ABNORMAL LOW (ref 13.0–17.0)

## 2015-07-26 MED ORDER — INSULIN GLARGINE 100 UNIT/ML ~~LOC~~ SOLN
20.0000 [IU] | Freq: Every day | SUBCUTANEOUS | Status: DC
  Administered 2015-07-26 – 2015-07-29 (×4): 20 [IU] via SUBCUTANEOUS
  Filled 2015-07-26 (×10): qty 0.2

## 2015-07-26 NOTE — Care Management Important Message (Signed)
Important Message  Patient Details  Name: Chad Fuller MRN: [id number] Date of Birth: 1926-11-24   Medicare Important Message Given:  Yes-second notification given    Nathen May 07/26/2015, 1:58 [place] Message  Patient Details  Name: Chad Fuller MRN: [id number] Date of Birth: 1926/08/01   Medicare Important Message Given:  Yes-second notification given    Nathen May 07/26/2015, 1:58 PM

## 2015-07-26 NOTE — Progress Notes (Signed)
Patient to transfer to 6E01 report given to receiving nurse Camika all questions answered at this time.  Pt. VSS with no s/s of distress noted.  Patient stable at transfer.

## 2015-07-26 NOTE — Progress Notes (Signed)
Inpatient Diabetes Program Recommendations  AACE/ADA: New Consensus Statement on Inpatient Glycemic Control (2013)  Target Ranges:  Prepandial:   less than 140 mg/dL      Peak postprandial:   less than 180 mg/dL (1-2 hours)      Critically ill patients:  140 - 180 mg/dL   Inpatient Diabetes Program Recommendations Insulin - Basal: consider adding a portion of patient's home dose Lantus Correction (SSI): change Novolog to TID + HS scale per Glycemic Control order-set Thank you  Raoul Pitch BSN, RN,CDE Inpatient Diabetes Coordinator [phone] (team pager)

## 2015-07-26 NOTE — Progress Notes (Signed)
PROGRESS NOTE  BRANDUN PINN NLZ:[id number] DOB: 08/31/1926 DOA: 07/23/2015 PCP: Mathews Argyle, MD   HPI: Chad Fuller is a 81 y.o. male who presented to the ED with multiple episodes of BRBPR (bright red blood per rectum) onset. He had 4-5 episodes since initial onset at time of presentation. He had another episode in the ED followed by an episode of hypotension with SBPs in the 70s. BP improved with IVF bolus. No anticoagulation, no abdominal pain or other symptoms.  Subjective / 24 H Interval events - no further BMs overnight, one this morning without significant bleeding - wanting to eat  Assessment/Plan: Principal Problem:   Rectal bleeding Active Problems:   Type 2 diabetes mellitus   GI bleed   Hypotension   GI bleed, hematochezia - 2 episodes of bloody bowel movements since colonoscopy, hb is overall stable - IV fluids stopped due to pedal edema - s/p colonoscopy and EGD 7/27, EGD without bleeding and colonoscopy with large amount of bright red blood throughout the distal left colon, sigmoid colon and rectum with extensive diverticular disease, without clear active bleeding site.  - GI consulted, one bloody BM yesterday, tagged RBC scan done, negative - overall stable, transfer to floor  Hypotension - in setting of GI bleed - BP continues to be stable  - Cycle hb  Diabetes - eating better, his diet was advanced by GI, will add back Lantus  HTN - continue to hold diuretic in setting of GI bleed  Dizziness - He report dizziness started back 4 weeks ago. He initially had dizziness with his stroke but resolved.  - resolved   Diet: Diet Carb Modified Fluid consistency:: Thin; Room service appropriate?: Yes Fluids: None DVT Prophylaxis: SCDs  Code Status: Full Code Family Communication: daughter at bedside Disposition Plan: transfer to telemetry   Consultants:  GI  General surgery  Procedures:  Colonoscopy and EGD  7/27   Antibiotics  Anti-infectives    None      Studies Nm Gi Blood Loss  07/25/2015   CLINICAL DATA:  Active GI bleeding for past 2 days.  EXAM: NUCLEAR MEDICINE GASTROINTESTINAL BLEEDING SCAN  TECHNIQUE: Sequential abdominal images were obtained following intravenous administration of Tc-9mlabeled red blood cells.  RADIOPHARMACEUTICALS:  25.0 mCi Tc-961mn-vitro labeled red cells.  COMPARISON:  None.  FINDINGS: Physiologic distribution of blood pool activity is seen. No evidence of abnormal radiopharmaceutical accumulation within the GI tract during 120 minutes imaging time.  IMPRESSION: Negative.  No active gastrointestinal bleeding demonstrated.   Electronically Signed   By: JoEarle Gell.D.   On: 07/25/2015 15:10   Objective  Filed Vitals:   07/26/15 0700 07/26/15 0706 07/26/15 1212 07/26/15 1448  BP:  142/97 134/67   Pulse:      Temp: 98.4 F (36.9 C)  98 F (36.7 C) 98.8 F (37.1 C)  TempSrc: Oral  Oral Oral  Resp:  16 23   Height:      Weight:      SpO2:        Intake/Output Summary (Last 24 hours) at 07/26/15 1521 Last data filed at 07/26/15 1011  Gross per 24 hour  Intake    530 ml  Output   2500 ml  Net  -1970 ml   Filed Weights   07/23/15 0449  Weight: 99.9 kg (220 lb 3.8 oz)   Exam:  General: Patient is pleasant, lying supine in bed, in no acute distress.  HEENT: Normocephalic, atraumatic, PERRL, conjunctivae normal  Cardiovascular: RRR without m/r/g, S1 and S2 normal  Respiratory: CTA bilaterally, good air movement, no wheezing, no crackles, no rales  Abdomen: soft, non-tender, non-distended, +BS, no guarding  MSK/Extremities: No clubbing/cyanosis, +1 pedal edema  Skin: no rashes  Neuro: non-focal  Data Reviewed: Basic Metabolic Panel:  Recent Labs Lab 07/23/15 0135 07/26/15 0120  NA 136 136  K 3.7 3.6  CL 98* 103  CO2 30 28  GLUCOSE 201* 182*  BUN 13 6  CREATININE 0.71 0.71  CALCIUM 8.8* 8.0*   Liver Function  Tests:  Recent Labs Lab 07/23/15 0135  AST 21  ALT 17  ALKPHOS 92  BILITOT 0.7  PROT 5.8*  ALBUMIN 3.5   CBC:  Recent Labs Lab 07/23/15 0135  07/25/15 1345 07/25/15 2039 07/26/15 0120 07/26/15 0720 07/26/15 1330  WBC 6.6  --   --   --   --   --   --   NEUTROABS 4.1  --   --   --   --   --   --   HGB 13.4  < > 7.7* 8.5* 8.1* 8.2* 7.8*  HCT 37.6*  < > 21.9* 24.5* 22.9* 23.4* 22.0*  MCV 90.0  --   --   --   --   --   --   PLT 216  --   --   --   --   --   --   < > = values in this interval not displayed.  CBG:  Recent Labs Lab 07/25/15 1917 07/25/15 2317 07/26/15 0326 07/26/15 0828 07/26/15 1214  GLUCAP 243* 190* 133* 153* 233*    Recent Results (from the past 240 hour(s))  MRSA PCR Screening     Status: None   Collection Time: 07/23/15  5:35 AM  Result Value Ref Range Status   MRSA by PCR NEGATIVE NEGATIVE Final    Comment:        The GeneXpert MRSA Assay (FDA approved for NASAL specimens only), is one component of a comprehensive MRSA colonization surveillance program. It is not intended to diagnose MRSA infection nor to guide or monitor treatment for MRSA infections.      Scheduled Meds: . ezetimibe  10 mg Oral Daily  . famotidine (PEPCID) IV  20 mg Intravenous Q12H  . insulin aspart  0-9 Units Subcutaneous 6 times per day  . polyethylene glycol  17 g Oral Daily  . sodium chloride  3 mL Intravenous Q12H   Time spent: 25 minutes  Marzetta Board, MD [facility] Pager [phone]. If 7 PM - 7 AM, please contact night-coverage at [email or web address], password [facility] 07/26/2015, 3:21 PM  LOS: 3 days

## 2015-07-26 NOTE — Progress Notes (Signed)
Patient ID: Chad Fuller, male   DOB: 01-09-1926, 81 y.o.   MRN: [id number] 2 Days Post-Op  Subjective: Pt had a bloody BM at 1000am yesterday, but none since.  Taking in clears.  Nuc MEd scan negative  Objective: Vital signs in last 24 hours: Temp:  [97.3 F (36.3 C)-98.5 F (36.9 C)] 98.5 F (36.9 C) (07/29 0326) Pulse Rate:  [89-99] 89 (07/29 0326) Resp:  [18-31] 31 (07/29 0326) BP: (141-185)/(65-85) 154/69 mmHg (07/29 0326) SpO2:  [94 %-97 %] 94 % (07/29 0326) Last BM Date: 07/25/15  Intake/Output from previous day: 07/28 0701 - 07/29 0700 In: 89 [IV Piggyback:50] Out: 3400 [Urine:3400] Intake/Output this shift:    PE: Abd: soft, NT, ND, +BS Heart: regular Lungs: CTAB  Lab Results:   Recent Labs  07/26/15 0120 07/26/15 0720  HGB 8.1* 8.2*  HCT 22.9* 23.4*   BMET  Recent Labs  07/26/15 0120  NA 136  K 3.6  CL 103  CO2 28  GLUCOSE 182*  BUN 6  CREATININE 0.71  CALCIUM 8.0*   PT/INR No results for input(s): LABPROT, INR in the last 72 hours. CMP     Component Value Date/Time   NA 136 07/26/2015 0120   NA 142 02/21/2014 0827   K 3.6 07/26/2015 0120   K 4.3 02/21/2014 0827   CL 103 07/26/2015 0120   CL 101 02/22/2013 1415   CO2 28 07/26/2015 0120   CO2 31* 02/21/2014 0827   GLUCOSE 182* 07/26/2015 0120   GLUCOSE 159* 02/21/2014 0827   GLUCOSE 254* 02/22/2013 1415   BUN 6 07/26/2015 0120   BUN 13.8 02/21/2014 0827   CREATININE 0.71 07/26/2015 0120   CREATININE 0.8 02/21/2014 0827   CALCIUM 8.0* 07/26/2015 0120   CALCIUM 9.5 02/21/2014 0827   PROT 5.8* 07/23/2015 0135   PROT 6.8 02/21/2014 0827   ALBUMIN 3.5 07/23/2015 0135   ALBUMIN 3.7 02/21/2014 0827   AST 21 07/23/2015 0135   AST 22 02/21/2014 0827   ALT 17 07/23/2015 0135   ALT 17 02/21/2014 0827   ALKPHOS 92 07/23/2015 0135   ALKPHOS 93 02/21/2014 0827   BILITOT 0.7 07/23/2015 0135   BILITOT 0.79 02/21/2014 0827   GFRNONAA >60 07/26/2015 0120   GFRAA >60 07/26/2015 0120    Lipase  No results found for: LIPASE     Studies/Results: Nm Gi Blood Loss  07/25/2015   CLINICAL DATA:  Active GI bleeding for past 2 days.  EXAM: NUCLEAR MEDICINE GASTROINTESTINAL BLEEDING SCAN  TECHNIQUE: Sequential abdominal images were obtained following intravenous administration of Tc-30mlabeled red blood cells.  RADIOPHARMACEUTICALS:  25.0 mCi Tc-955mn-vitro labeled red cells.  COMPARISON:  None.  FINDINGS: Physiologic distribution of blood pool activity is seen. No evidence of abnormal radiopharmaceutical accumulation within the GI tract during 120 minutes imaging time.  IMPRESSION: Negative.  No active gastrointestinal bleeding demonstrated.   Electronically Signed   By: JoEarle Gell.D.   On: 07/25/2015 15:10    Anti-infectives: Anti-infectives    None       Assessment/Plan  1. LGI bleed -hgb is stable.  No bloody BM since yesterday morning.  No surgical intervention warranted at this time.   -diet per GI and primary service    LOS: 3 days    Jeramine Delis E 07/26/2015, 8:23 AM Pager: [phone]

## 2015-07-26 NOTE — Progress Notes (Signed)
[facility] Progress Note  Subjective: Feels fine, no stool since yesterday morning  Objective: Vital signs in last 24 hours: Temp:  [97.3 F (36.3 C)-98.5 F (36.9 C)] 98.5 F (36.9 C) (07/29 0326) Pulse Rate:  [89-99] 89 (07/29 0326) Resp:  [18-31] 31 (07/29 0326) BP: (141-185)/(65-85) 154/69 mmHg (07/29 0326) SpO2:  [94 %-97 %] 94 % (07/29 0326) Weight change:    PE: Soft nondistended with normoactive bowel sounds  Lab Results: Results for orders placed or performed during the hospital encounter of 07/23/15 (from the past 24 hour(s))  Glucose, capillary     Status: Abnormal   Collection Time: 07/25/15 11:12 AM  Result Value Ref Range   Glucose-Capillary 169 (H) 65 - 99 mg/dL   Comment 1 Notify RN    Comment 2 Document in Chart   Hemoglobin and hematocrit, blood     Status: Abnormal   Collection Time: 07/25/15  1:45 PM  Result Value Ref Range   Hemoglobin 7.7 (L) 13.0 - 17.0 g/dL   HCT 21.9 (L) 39.0 - 52.0 %  Glucose, capillary     Status: Abnormal   Collection Time: 07/25/15  4:19 PM  Result Value Ref Range   Glucose-Capillary 167 (H) 65 - 99 mg/dL   Comment 1 Notify RN    Comment 2 Document in Chart   Glucose, capillary     Status: Abnormal   Collection Time: 07/25/15  7:17 PM  Result Value Ref Range   Glucose-Capillary 243 (H) 65 - 99 mg/dL  Hemoglobin and hematocrit, blood     Status: Abnormal   Collection Time: 07/25/15  8:39 PM  Result Value Ref Range   Hemoglobin 8.5 (L) 13.0 - 17.0 g/dL   HCT 24.5 (L) 39.0 - 52.0 %  Glucose, capillary     Status: Abnormal   Collection Time: 07/25/15 11:17 PM  Result Value Ref Range   Glucose-Capillary 190 (H) 65 - 99 mg/dL  Hemoglobin and hematocrit, blood     Status: Abnormal   Collection Time: 07/26/15  1:20 AM  Result Value Ref Range   Hemoglobin 8.1 (L) 13.0 - 17.0 g/dL   HCT 22.9 (L) 39.0 - 29.9 %  Basic metabolic panel     Status: Abnormal   Collection Time: 07/26/15  1:20 AM  Result Value Ref  Range   Sodium 136 135 - 145 mmol/L   Potassium 3.6 3.5 - 5.1 mmol/L   Chloride 103 101 - 111 mmol/L   CO2 28 22 - 32 mmol/L   Glucose, Bld 182 (H) 65 - 99 mg/dL   BUN 6 6 - 20 mg/dL   Creatinine, Ser 0.71 0.61 - 1.24 mg/dL   Calcium 8.0 (L) 8.9 - 10.3 mg/dL   GFR calc non Af Amer >60 >60 mL/min   GFR calc Af Amer >60 >60 mL/min   Anion gap 5 5 - 15  Glucose, capillary     Status: Abnormal   Collection Time: 07/26/15  3:26 AM  Result Value Ref Range   Glucose-Capillary 133 (H) 65 - 99 mg/dL  Hemoglobin and hematocrit, blood     Status: Abnormal   Collection Time: 07/26/15  7:20 AM  Result Value Ref Range   Hemoglobin 8.2 (L) 13.0 - 17.0 g/dL   HCT 23.4 (L) 39.0 - 52.0 %    Studies/Results: Nm Gi Blood Loss  07/25/2015   CLINICAL DATA:  Active GI bleeding for past 2 days.  EXAM: NUCLEAR MEDICINE GASTROINTESTINAL BLEEDING SCAN  TECHNIQUE: Sequential abdominal images  were obtained following intravenous administration of Tc-28mlabeled red blood cells.  RADIOPHARMACEUTICALS:  25.0 mCi Tc-917mn-vitro labeled red cells.  COMPARISON:  None.  FINDINGS: Physiologic distribution of blood pool activity is seen. No evidence of abnormal radiopharmaceutical accumulation within the GI tract during 120 minutes imaging time.  IMPRESSION: Negative.  No active gastrointestinal bleeding demonstrated.   Electronically Signed   By: JoEarle Gell.D.   On: 07/25/2015 15:10      Assessment: GI bleeding probably proximal colonic source although undefined by EGD and colonoscopy. Pooled RBC scan negative  Plan: Advance diet, continue to monitor stools and hemoglobin. Ambulate as tolerated. No further investigations planned unless signs of recurrent bleeding.    Velia Pamer C 07/26/2015, 8:43 AM  Pager [phone] no answer or after 5 PM call [phone]

## 2015-07-27 LAB — HEMOGLOBIN AND HEMATOCRIT, BLOOD
HCT: 23.1 % — ABNORMAL LOW (ref 39.0–52.0)
HEMATOCRIT: 22.5 % — AB (ref 39.0–52.0)
HEMOGLOBIN: 7.9 g/dL — AB (ref 13.0–17.0)
Hemoglobin: 8.3 g/dL — ABNORMAL LOW (ref 13.0–17.0)

## 2015-07-27 LAB — GLUCOSE, CAPILLARY
GLUCOSE-CAPILLARY: 240 mg/dL — AB (ref 65–99)
GLUCOSE-CAPILLARY: 282 mg/dL — AB (ref 65–99)
GLUCOSE-CAPILLARY: 238 mg/dL — AB (ref 65–99)
Glucose-Capillary: 196 mg/dL — ABNORMAL HIGH (ref 65–99)
Glucose-Capillary: 180 mg/dL — ABNORMAL HIGH (ref 65–99)
Glucose-Capillary: 164 mg/dL — ABNORMAL HIGH (ref 65–99)

## 2015-07-27 MED ORDER — INSULIN ASPART 100 UNIT/ML ~~LOC~~ SOLN
0.0000 [IU] | Freq: Every day | SUBCUTANEOUS | Status: DC
  Administered 2015-07-27 – 2015-07-28 (×2): 2 [IU] via SUBCUTANEOUS

## 2015-07-27 MED ORDER — INSULIN ASPART 100 UNIT/ML ~~LOC~~ SOLN
0.0000 [IU] | Freq: Three times a day (TID) | SUBCUTANEOUS | Status: DC
  Administered 2015-07-27: 5 [IU] via SUBCUTANEOUS
  Administered 2015-07-27: 3 [IU] via SUBCUTANEOUS
  Administered 2015-07-28 (×2): 5 [IU] via SUBCUTANEOUS
  Administered 2015-07-29: 2 [IU] via SUBCUTANEOUS
  Administered 2015-07-29: 5 [IU] via SUBCUTANEOUS

## 2015-07-27 MED ORDER — CALCIUM CARBONATE ANTACID 500 MG PO CHEW
1.0000 | CHEWABLE_TABLET | Freq: Once | ORAL | Status: AC
  Administered 2015-07-27: 200 mg via ORAL
  Filled 2015-07-27: qty 1

## 2015-07-27 MED ORDER — FAMOTIDINE 20 MG PO TABS
20.0000 mg | ORAL_TABLET | Freq: Two times a day (BID) | ORAL | Status: DC
  Administered 2015-07-27 – 2015-07-29 (×4): 20 mg via ORAL
  Filled 2015-07-27 (×4): qty 1

## 2015-07-27 NOTE — Progress Notes (Signed)
[name]:[id number] DOB: [birth year] DOA: 07/23/2015 PCP: Mathews Argyle, MD   HPI: Chad Fuller is a 81 y.o. male who presented to the ED with multiple episodes of BRBPR (bright red blood per rectum) onset. He had 4-5 episodes since initial onset at time of presentation. He had another episode in the ED followed by an episode of hypotension with SBPs in the 70s. BP improved with IVF bolus. No anticoagulation, no abdominal pain or other symptoms.  Subjective / 24 H Interval events - feeling well this morning - patient reports a bloody BM last night, night RN unaware  Assessment/Plan: Principal Problem:   Rectal bleeding Active Problems:   Type 2 diabetes mellitus   GI bleed   Hypotension   GI bleed, hematochezia - hb is overall stable, it appears that his bleeding has stopped - IV fluids stopped due to pedal edema - s/p colonoscopy and EGD 7/27, EGD without bleeding and colonoscopy with large amount of bright red blood throughout the distal left colon, sigmoid colon and rectum with extensive diverticular disease, without clear active bleeding site.  - GI consulted, tagged RBC scan done, negative  Hypotension - in setting of GI bleed - BP continues to be stable  - Cycle hb  Diabetes - eating better,  - continue insulin  HTN - continue to hold diuretic in setting of GI bleed  Dizziness - He report dizziness started back 4 weeks ago. He initially had dizziness with his stroke but resolved.  - resolved   Diet: Diet Carb Modified Fluid consistency:: Thin; Room service appropriate?: Yes Fluids: None DVT Prophylaxis: SCDs  Code Status: Full Code Family Communication: no family bedside Disposition Plan: home 1-2 days if no further bleeding  Consultants:  GI  General surgery  Procedures:  Colonoscopy and EGD 7/27   Antibiotics  Anti-infectives    None      Studies Nm Gi Blood Loss  07/25/2015   CLINICAL DATA:   Active GI bleeding for past 2 days.  EXAM: NUCLEAR MEDICINE GASTROINTESTINAL BLEEDING SCAN  TECHNIQUE: Sequential abdominal images were obtained following intravenous administration of Tc-38mlabeled red blood cells.  RADIOPHARMACEUTICALS:  25.0 mCi Tc-935mn-vitro labeled red cells.  COMPARISON:  None.  FINDINGS: Physiologic distribution of blood pool activity is seen. No evidence of abnormal radiopharmaceutical accumulation within the GI tract during 120 minutes imaging time.  IMPRESSION: Negative.  No active gastrointestinal bleeding demonstrated.   Electronically Signed   By: JoEarle Gell.D.   On: 07/25/2015 15:10   Objective  Filed Vitals:   07/26/15 1448 07/26/15 1646 07/26/15 2049 07/27/15 0455  BP:  135/71 139/62 135/60  Pulse:  90 93 89  Temp: 98.8 F (37.1 C) 98 F (36.7 C) 98.4 F (36.9 C) 98.3 F (36.8 C)  TempSrc: Oral Oral Oral Oral  Resp:  '20 20 18  '$ Height:      Weight:      SpO2:  96% 97% 97%    Intake/Output Summary (Last 24 hours) at 07/27/15 0721 Last data filed at 07/27/15 0600  Gross per 24 hour  Intake    700 ml  Output   1000 ml  Net   -300 ml   Filed Weights   07/23/15 0449  Weight: 99.9 kg (220 lb 3.8 oz)   Exam:  General: NAD  HEENT: Normocephalic, atraumatic, conjunctivae normal  Cardiovascular: RRR without m/r/g, S1 and S2 normal  Respiratory: CTA bilaterally, good air movement, no wheezing, no crackles,  no rales  Abdomen: soft, non-tender, non-distended, +BS, no guarding  MSK/Extremities: No clubbing/cyanosis, +1 pedal edema  Data Reviewed: Basic Metabolic Panel:  Recent Labs Lab 07/23/15 0135 07/26/15 0120  NA 136 136  K 3.7 3.6  CL 98* 103  CO2 30 28  GLUCOSE 201* 182*  BUN 13 6  CREATININE 0.71 0.71  CALCIUM 8.8* 8.0*   Liver Function Tests:  Recent Labs Lab 07/23/15 0135  AST 21  ALT 17  ALKPHOS 92  BILITOT 0.7  PROT 5.8*  ALBUMIN 3.5   CBC:  Recent Labs Lab 07/23/15 0135  07/26/15 0120 07/26/15 0720  07/26/15 1330 07/26/15 1916 07/27/15 0132  WBC 6.6  --   --   --   --   --   --   NEUTROABS 4.1  --   --   --   --   --   --   HGB 13.4  < > 8.1* 8.2* 7.8* 7.8* 7.9*  HCT 37.6*  < > 22.9* 23.4* 22.0* 22.2* 22.5*  MCV 90.0  --   --   --   --   --   --   PLT 216  --   --   --   --   --   --   < > = values in this interval not displayed.  CBG:  Recent Labs Lab 07/26/15 0828 07/26/15 1214 07/26/15 2017 07/27/15 0044 07/27/15 0405  GLUCAP 153* 233* 267* 196* 180*    Recent Results (from the past 240 hour(s))  MRSA PCR Screening     Status: None   Collection Time: 07/23/15  5:35 AM  Result Value Ref Range Status   MRSA by PCR NEGATIVE NEGATIVE Final    Comment:        The GeneXpert MRSA Assay (FDA approved for NASAL specimens only), is one component of a comprehensive MRSA colonization surveillance program. It is not intended to diagnose MRSA infection nor to guide or monitor treatment for MRSA infections.      Scheduled Meds: . ezetimibe  10 mg Oral Daily  . famotidine (PEPCID) IV  20 mg Intravenous Q12H  . insulin aspart  0-9 Units Subcutaneous 6 times per day  . insulin glargine  20 Units Subcutaneous Daily  . polyethylene glycol  17 g Oral Daily  . sodium chloride  3 mL Intravenous Q12H    Marzetta Board, MD [facility] Pager [phone]. If 7 PM - 7 AM, please contact night-coverage at [email or web address], password [facility] 07/27/2015, 7:21 AM  LOS: 4 days

## 2015-07-28 LAB — GLUCOSE, CAPILLARY
GLUCOSE-CAPILLARY: 223 mg/dL — AB (ref 65–99)
Glucose-Capillary: 258 mg/dL — ABNORMAL HIGH (ref 65–99)
Glucose-Capillary: 37 mg/dL — CL (ref 65–99)
Glucose-Capillary: 260 mg/dL — ABNORMAL HIGH (ref 65–99)
Glucose-Capillary: 272 mg/dL — ABNORMAL HIGH (ref 65–99)

## 2015-07-28 LAB — HEMOGLOBIN AND HEMATOCRIT, BLOOD
HCT: 22.8 % — ABNORMAL LOW (ref 39.0–52.0)
HEMATOCRIT: 22.5 % — AB (ref 39.0–52.0)
HEMOGLOBIN: 7.9 g/dL — AB (ref 13.0–17.0)
HEMOGLOBIN: 7.8 g/dL — AB (ref 13.0–17.0)

## 2015-07-28 LAB — BASIC METABOLIC PANEL
Anion gap: 7 (ref 5–15)
BUN: 11 mg/dL (ref 6–20)
CHLORIDE: 101 mmol/L (ref 101–111)
CO2: 27 mmol/L (ref 22–32)
Calcium: 8.2 mg/dL — ABNORMAL LOW (ref 8.9–10.3)
Creatinine, Ser: 0.62 mg/dL (ref 0.61–1.24)
GFR calc Af Amer: >60 mL/min (ref >60)
GFR calc non Af Amer: >60 mL/min (ref >60)
Glucose, Bld: 193 mg/dL — ABNORMAL HIGH (ref 65–99)
Potassium: 3.5 mmol/L (ref 3.5–5.1)
Sodium: 135 mmol/L (ref 135–145)

## 2015-07-28 MED ORDER — GI COCKTAIL ~~LOC~~
30.0000 mL | Freq: Three times a day (TID) | ORAL | Status: DC | PRN
  Administered 2015-07-28 (×2): 30 mL via ORAL
  Filled 2015-07-28 (×2): qty 30

## 2015-07-28 NOTE — Progress Notes (Signed)
PROGRESS NOTE  Chad Fuller:[id number] DOB: 1926-10-02 DOA: 07/23/2015 PCP: Mathews Argyle, MD   HPI: Chad Fuller is a 81 y.o. male who presented to the ED with multiple episodes of BRBPR (bright red blood per rectum) onset. He had 4-5 episodes since initial onset at time of presentation. He had another episode in the ED followed by an episode of hypotension with SBPs in the 70s. BP improved with IVF bolus. No anticoagulation, no abdominal pain or other symptoms.  Subjective / 24 H Interval events - feeling well this morning - stools well formed, no large bleeding noted  Assessment/Plan: Principal Problem:   Rectal bleeding Active Problems:   Type 2 diabetes mellitus   GI bleed   Hypotension   GI bleed, hematochezia - hb is overall stable, it appears that his bleeding has stopped - IV fluids stopped due to pedal edema - s/p colonoscopy and EGD 7/27, EGD without bleeding and colonoscopy with large amount of bright red blood throughout the distal left colon, sigmoid colon and rectum with extensive diverticular disease, without clear active bleeding site.  - GI consulted, tagged RBC scan done, negative - We'll monitor for the next 24 hours, if his hemoglobin is stable tomorrow he may be able to go home. I discussed with gastroenterologist Dr. Amedeo Plenty today.  Hypotension - in setting of GI bleed - BP continues to be stable  - Cycle hb  Diabetes - eating better,  - continue insulin  HTN - continue to hold diuretic in setting of GI bleed  Dizziness - He report dizziness started back 4 weeks ago. He initially had dizziness with his stroke but resolved.  - resolved   Diet: Diet Carb Modified Fluid consistency:: Thin; Room service appropriate?: Yes Fluids: None DVT Prophylaxis: SCDs  Code Status: Full Code Family Communication: no family bedside Disposition Plan: home 1 day  Consultants:  GI  General surgery  Procedures:  Colonoscopy and EGD  7/27   Antibiotics  Anti-infectives    None      Studies No results found. Objective  Filed Vitals:   07/27/15 1733 07/27/15 2112 07/28/15 0534 07/28/15 0811  BP: 132/65 152/71 140/64 142/68  Pulse: 81 91 85 85  Temp: 98.1 F (36.7 C) 98.4 F (36.9 C) 98.1 F (36.7 C) 97.8 F (36.6 C)  TempSrc: Oral   Oral  Resp: '18 18 18 18  '$ Height:      Weight:  96.616 kg (213 lb)    SpO2: 98% 97% 97% 98%    Intake/Output Summary (Last 24 hours) at 07/28/15 1333 Last data filed at 07/28/15 4970  Gross per 24 hour  Intake    960 ml  Output   3050 ml  Net  -2090 ml   Filed Weights   07/23/15 0449 07/27/15 2112  Weight: 99.9 kg (220 lb 3.8 oz) 96.616 kg (213 lb)   Exam:  General: NAD  HEENT: Normocephalic, atraumatic, conjunctivae normal  Cardiovascular: RRR without m/r/g, S1 and S2 normal  Respiratory: CTA bilaterally, good air movement, no wheezing, no crackles, no rales  Abdomen: soft, non-tender, non-distended, +BS, no guarding  Data Reviewed: Basic Metabolic Panel:  Recent Labs Lab 07/23/15 0135 07/26/15 0120 07/28/15 0119  NA 136 136 135  K 3.7 3.6 3.5  CL 98* 103 101  CO2 '30 28 27  '$ GLUCOSE 201* 182* 193*  BUN '13 6 11  '$ CREATININE 0.71 0.71 0.62  CALCIUM 8.8* 8.0* 8.2*   Liver Function Tests:  Recent Labs Lab  07/23/15 0135  AST 21  ALT 17  ALKPHOS 92  BILITOT 0.7  PROT 5.8*  ALBUMIN 3.5   CBC:  Recent Labs Lab 07/23/15 0135  07/26/15 1330 07/26/15 1916 07/27/15 0132 07/27/15 1405 07/28/15 0119  WBC 6.6  --   --   --   --   --   --   NEUTROABS 4.1  --   --   --   --   --   --   HGB 13.4  < > 7.8* 7.8* 7.9* 8.3* 7.9*  HCT 37.6*  < > 22.0* 22.2* 22.5* 23.1* 22.8*  MCV 90.0  --   --   --   --   --   --   PLT 216  --   --   --   --   --   --   < > = values in this interval not displayed.  CBG:  Recent Labs Lab 07/27/15 1616 07/27/15 2109 07/28/15 0736 07/28/15 1156 07/28/15 1213  GLUCAP 282* 238* 258* 37* 223*    Recent  Results (from the past 240 hour(s))  MRSA PCR Screening     Status: None   Collection Time: 07/23/15  5:35 AM  Result Value Ref Range Status   MRSA by PCR NEGATIVE NEGATIVE Final    Comment:        The GeneXpert MRSA Assay (FDA approved for NASAL specimens only), is one component of a comprehensive MRSA colonization surveillance program. It is not intended to diagnose MRSA infection nor to guide or monitor treatment for MRSA infections.      Scheduled Meds: . ezetimibe  10 mg Oral Daily  . famotidine  20 mg Oral BID  . insulin aspart  0-5 Units Subcutaneous QHS  . insulin aspart  0-9 Units Subcutaneous TID WC  . insulin glargine  20 Units Subcutaneous Daily  . polyethylene glycol  17 g Oral Daily  . sodium chloride  3 mL Intravenous Q12H    Marzetta Board, MD [facility] Pager [phone]. If 7 PM - 7 AM, please contact night-coverage at [email or web address], password [facility] 07/28/2015, 1:33 PM  LOS: 5 days

## 2015-07-28 NOTE — Progress Notes (Signed)
Hypoglycemic Event  CBG: 37  Treatment: carb snack  Symptoms: none  Follow-up CBG: Time: 12:13 CBG Result: 223  Possible Reasons for Event: unknown  Comments/MD notified: Yes    Chad Fuller  Remember to initiate Hypoglycemia Order Set & complete

## 2015-07-28 NOTE — Evaluation (Signed)
Physical Therapy Evaluation Patient Details Name: Chad Fuller MRN: [id number] DOB: 26-Apr-1926 Today's Date: 07/28/2015   History of Present Illness  Patient is an 81 yo male admitted 07/23/15 with rectal bleeding and hypotension.  PMH:  DM, HTN, CVA 2015, lung CA, CAD  Clinical Impression  Patient presents with problems listed below.  Will benefit from acute PT to maximize functional independence prior to discharge home.  Recommend HHPT at discharge for continued therapy.    Follow Up Recommendations Home health PT;Supervision for mobility/OOB    Equipment Recommendations  None recommended by PT    Recommendations for Other Services       Precautions / Restrictions Precautions Precautions: Fall Precaution Comments: Patient reports he has fallen at home going up his steps and in his garden Restrictions Weight Bearing Restrictions: No      Mobility  Bed Mobility                  Transfers Overall transfer level: Needs assistance Equipment used: Rolling walker (2 wheeled) Transfers: Sit to/from Stand Sit to Stand: Min guard         General transfer comment: Patient uses safe hand placement.  Assist for safety/balance.  Ambulation/Gait Ambulation/Gait assistance: Min assist Ambulation Distance (Feet): 48 Feet Assistive device: Rolling walker (2 wheeled) Gait Pattern/deviations: Step-through pattern;Decreased stride length;Trunk flexed Gait velocity: Decreased Gait velocity interpretation: Below normal speed for age/gender General Gait Details: Verbal cues for safe use of RW.  Assist to steady during gait.  Cues to stand upright.  Stairs            Wheelchair Mobility    Modified Rankin (Stroke Patients Only)       Balance Overall balance assessment: Needs assistance         Standing balance support: Single extremity supported Standing balance-Leahy Scale: Poor                               Pertinent Vitals/Pain Pain  Assessment: No/denies pain    Home Living Family/patient expects to be discharged to:: Private residence Living Arrangements: Alone Available Help at Discharge: Family (Has large family who provide assist) Type of Home: House Home Access: Stairs to enter Entrance Stairs-Rails: Right Entrance Stairs-Number of Steps: 4 Home Layout: One level Home Equipment: Walker - 2 wheels;Cane - single point;Shower seat;Bedside commode      Prior Function Level of Independence: Independent with assistive device(s)         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  He uses cane for ambulation.     Hand Dominance   Dominant Hand: Right    Extremity/Trunk Assessment   Upper Extremity Assessment: Overall WFL for tasks assessed           Lower Extremity Assessment: Generalized weakness      Cervical / Trunk Assessment: Kyphotic  Communication   Communication: HOH (Hearing aid)  Cognition Arousal/Alertness: Awake/alert Behavior During Therapy: WFL for tasks assessed/performed Overall Cognitive Status: Within Functional Limits for tasks assessed                      General Comments      Exercises        Assessment/Plan    PT Assessment Patient needs continued PT services  PT Diagnosis Abnormality of gait;Generalized weakness   PT Problem List Decreased strength;Decreased activity tolerance;Decreased balance;Decreased mobility  PT Treatment Interventions DME instruction;Gait training;Stair training;Functional mobility training;Therapeutic activities;Balance  training;Patient/family education   PT Goals (Current goals can be found in the Care Plan section) Acute Rehab PT Goals Patient Stated Goal: To return home PT Goal Formulation: With patient Time For Goal Achievement: 08/04/15 Potential to Achieve Goals: Good    Frequency Min 3X/week   Barriers to discharge Decreased caregiver support Lives alone    Co-evaluation               End of Session Equipment  Utilized During Treatment: Gait belt Activity Tolerance: Patient limited by fatigue Patient left: in chair;with call bell/phone within reach Nurse Communication: Mobility status         Time: 7129-2909 PT Time Calculation (min) (ACUTE ONLY): 28 min   Charges:   PT Evaluation $Initial PT Evaluation Tier I: 1 Procedure PT Treatments $Gait Training: 8-22 mins   PT G Codes:        Despina Pole 08/24/15, 11:30 AM Carita Pian. Sanjuana Kava, [place] Pager [phone]

## 2015-07-29 LAB — GLUCOSE, CAPILLARY
GLUCOSE-CAPILLARY: 186 mg/dL — AB (ref 65–99)
Glucose-Capillary: 268 mg/dL — ABNORMAL HIGH (ref 65–99)

## 2015-07-29 LAB — HEMOGLOBIN AND HEMATOCRIT, BLOOD
HEMATOCRIT: 23 % — AB (ref 39.0–52.0)
HEMOGLOBIN: 8 g/dL — AB (ref 13.0–17.0)

## 2015-07-29 MED ORDER — POLYETHYLENE GLYCOL 3350 17 G PO PACK: 17.0000 g | PACK | Freq: Every day | ORAL | Status: DC | PRN

## 2015-07-29 NOTE — Care Management Note (Signed)
Case Management Note  Patient Details  Name: Chad Fuller MRN: [id number] Date of Birth: [birth year]  Subjective/Objective:       CM following for progression and d/c planning.             Action/Plan: 07/29/2015 Met with pt who is agreeable to HHPT, he has no preferance as to agency and he has DME.  Expected Discharge Date:       07/29/2015           Expected Discharge Plan:  [place]  In-House Referral:  NA  Discharge planning Services  CM Consult  Post Acute Care Choice:  Home Health Choice offered to:  Patient  DME Arranged:    DME Agency:     HH Arranged:  PT [place]:  Other - See comment  Status of Service:  Completed, signed off  Medicare Important Message Given:  Yes-third notification given Date Medicare IM Given:    Medicare IM give by:    Date Additional Medicare IM Given:    Additional Medicare Important Message give by:     If discussed at [place] of Stay Meetings, dates discussed:    Additional Comments: HHPT will be provided by [facility] and services will begin within 24-72 hr. Per [name], [facility] rep. Jermiah Howton, Rory Percy, RN 07/29/2015, 12:24 PM

## 2015-07-29 NOTE — Progress Notes (Signed)
Physical Therapy Treatment Patient Details Name: Chad Fuller MRN: [id number] DOB: [birth year] Today's Date: 07/29/2015    History of Present Illness Patient is an 81 yo male admitted 07/23/15 with rectal bleeding and hypotension.  PMH:  DM, HTN, CVA 2015, lung CA, CAD    PT Comments    Making good progress; noted for dc home today, and PT can be on board for this; agree with HHPT follow up especially given his frequent losses of balance on his steps;   Instructed pt to take his steps slowly and deliberately and to look at his feet on the steps as he goes up/down the steps to enter his home   Follow Up Recommendations  Home health PT;Supervision for mobility/OOB     Equipment Recommendations  None recommended by PT    Recommendations for Other Services       Precautions / Restrictions Precautions Precautions: Fall Precaution Comments: Patient reports he has fallen at home going up his steps and in his garden    Mobility  Bed Mobility                  Transfers Overall transfer level: Needs assistance Equipment used: Rolling walker (2 wheeled) Transfers: Sit to/from Stand Sit to Stand: Supervision         General transfer comment: Good rise with assist from hands on armrests to push up; cues to control descent ot sit  Ambulation/Gait Ambulation/Gait assistance: Min guard (without physical contact) Ambulation Distance (Feet): 150 Feet (one seated rest break) Assistive device: Rolling walker (2 wheeled) Gait Pattern/deviations: Step-through pattern;Trunk flexed     General Gait Details: Continued cues for upright posture and to self-monitor for activity tolerance   Stairs Stairs: Yes Stairs assistance: Min assist Stair Management: Two rails;Alternating pattern;Forwards Number of Stairs: 5 General stair comments: Noted pt's L foot caught step with small loss of balance from which he did steady himself using rails  Wheelchair Mobility    Modified  Rankin (Stroke Patients Only)       Balance             Standing balance-Leahy Scale: Fair                      Cognition Arousal/Alertness: Awake/alert Behavior During Therapy: WFL for tasks assessed/performed Overall Cognitive Status: Within Functional Limits for tasks assessed                      Exercises      General Comments General comments (skin integrity, edema, etc.): BP 147/65 and HR 105 taken during a standing rest break during walk      Pertinent Vitals/Pain Pain Assessment: No/denies pain    Home Living                      Prior Function            PT Goals (current goals can now be found in the care plan section) Acute Rehab PT Goals Patient Stated Goal: To return home PT Goal Formulation: With patient Time For Goal Achievement: 08/04/15 Potential to Achieve Goals: Good Progress towards PT goals: Progressing toward goals    Frequency  Min 3X/week    PT Plan Current plan remains appropriate    Co-evaluation             End of Session Equipment Utilized During Treatment: Gait belt Activity Tolerance: Patient tolerated treatment well Patient left: in  chair;with call bell/phone within reach;with family/visitor present     Time: 1031-1058 PT Time Calculation (min) (ACUTE ONLY): 27 min  Charges:  $Gait Training: 23-37 mins                    G Codes:      Roney Marion Hamff 07/29/2015, 12:04 PM  Roney Marion, [place] Pager [phone] Office [phone]

## 2015-07-29 NOTE — Progress Notes (Signed)
Discharge instructions and medications discussed with patient.  All questions answered.  

## 2015-07-29 NOTE — Discharge Summary (Signed)
Physician Discharge Summary  Chad Fuller TKP:[id number] DOB: [birth year] DOA: 07/23/2015  PCP: Mathews Argyle, MD  Admit date: 07/23/2015 Discharge date: 07/29/2015  Time spent: > 25 minutes  Recommendations for Outpatient Follow-up:  1. Follow up with Dr. Felipa Eth in 1 week 2. Repeat CBC in 1 week 3. Follow up with Dr. Oletta Lamas in 2-4 weeks   Discharge Diagnoses:  Principal Problem:   Rectal bleeding Active Problems:   Type 2 diabetes mellitus   GI bleed   Hypotension  Discharge Condition: stable  Diet recommendation: diabetic  Filed Weights   07/23/15 0449 07/27/15 2112 07/28/15 2036  Weight: 99.9 kg (220 lb 3.8 oz) 96.616 kg (213 lb) 96.163 kg (212 lb)   History of present illness:  Chad Fuller is a 81 y.o. male who presents to the ED with multiple episodes of BRBPR onset tonight. He has 4-5 episodes since initial onset at time of presentation. He has another episode in the ED followed by an episode of hypotension with SBPs in the 70s. BP improves with IVF bolus. No anticoagulation, no abdominal pain or other symptoms.  Hospital Course:  Patient was admitted to the hospital with a GI bleed manifesting as bright red blood per rectum associated with hypotension. He was initially admitted to step down, gastroenterology was consulted and underwent a  colonoscopy on 7/27 which showed a large amount of bright red blood throughout the distal left colon, sigmoid colon and rectum with extensive diverticular disease, without clear active bleeding site. He also underwent an EGD which was fairly unremarkable. Patient was monitored very closely after the colonoscopy in step down, And after one more bloody bowel movement he underwent a tagged RBC scan which was negative for a source of bleeding. Given lack of source and initial anemia, general surgery was also consulted and followed patient while hospitalized. Patient underwent transfusion with 2 units of packed red blood cells  on 7/26 and 7/27, following which his hemoglobin has stabilized. He was transferred to the floor on 7/29, his hemoglobin has remained stable for 3 days following floor transfer, his diet was slowly advanced and was able to tolerate a regular diet. Clinically he stopped bleeding, with regular bowel movements without any further evidence of blood, his hemoglobin has remained stable, he is eating well, and was discharged home in stable condition and advised to follow-up in about a week with his primary care doctor. PT evaluated patient while hospitalized and recommended home health PT which was arranged prior to patient's discharge.  Procedures:  EGD and Colonoscopy 7/27   Consultations:  GI  Discharge Exam: Filed Vitals:   07/28/15 2036 07/29/15 0523 07/29/15 0829 07/29/15 1045  BP: 148/74 131/73 136/75 147/65  Pulse: 92 82 88 105  Temp: 98 F (36.7 C) 98.7 F (37.1 C) 97.8 F (36.6 C)   TempSrc:   Oral   Resp: '19 18 14   '$ Height:      Weight: 96.163 kg (212 lb)     SpO2: 96% 98% 98%     General: NAD Cardiovascular: RRR Respiratory: CTA biL  Discharge Instructions     Medication List    STOP taking these medications        BC HEADACHE 325-95-16 MG Tabs  Generic drug:  Aspirin-Salicylamide-Caffeine      TAKE these medications        ezetimibe 10 MG tablet  Commonly known as:  ZETIA  Take 10 mg by mouth daily.     GLUCOSAMINE-CHONDROITIN PO  Take 2 tablets by mouth every morning.     insulin glargine 100 UNIT/ML injection  Commonly known as:  LANTUS  Inject 0.5 mLs (50 Units total) into the skin daily.     losartan-hydrochlorothiazide 50-12.5 MG per tablet  Commonly known as:  HYZAAR  Take 1 tablet by mouth daily.     meloxicam 7.5 MG tablet  Commonly known as:  MOBIC  Take 7.5 mg by mouth daily.     metFORMIN 500 MG 24 hr tablet  Commonly known as:  GLUCOPHAGE-XR  Take 500 mg with breakfast and 1000 mg with supper     multivitamins ther. w/minerals Tabs  tablet  Take 1 tablet by mouth daily.     pantoprazole 40 MG tablet  Commonly known as:  PROTONIX  Take 1 tablet (40 mg total) by mouth daily.     polyethylene glycol packet  Commonly known as:  MIRALAX / GLYCOLAX  Take 17 g by mouth daily as needed for mild constipation.     tamsulosin 0.4 MG Caps capsule  Commonly known as:  FLOMAX  Take 0.4 mg by mouth daily.           Follow-up Information    Follow up with Mathews Argyle, MD. Schedule an appointment as soon as possible for a visit in 1 week.   Specialty:  Internal Medicine   Why:  repeat blood counts; APPOINTMENT: THURSDAY, 08-01-15 @ 3pm; ARRIVE @ 2:45pm FOR CHECK IN   Contact information:   [address] [place] [address] [phone]       Follow up with Vernie Ammons L, MD. Schedule an appointment as soon as possible for a visit in 2 weeks.   Specialty:  Gastroenterology   Why:  OFFICE WILL CALL PATIENT @ HOME TOMORROW, TUESDAY, 07-30-15, TO SCHEDULE FOLLOWUP APPOINTMENT   Contact information:   [address] [place] [address] [phone]       Please follow up.   Why:  Home Health Physical therapy will be provided by [facility], they will contact the pt at home.       The results of significant diagnostics from this hospitalization (including imaging, microbiology, ancillary and laboratory) are listed below for reference.    Significant Diagnostic Studies: Nm Gi Blood Loss  07/25/2015   CLINICAL DATA:  Active GI bleeding for past 2 days.  EXAM: NUCLEAR MEDICINE GASTROINTESTINAL BLEEDING SCAN  TECHNIQUE: Sequential abdominal images were obtained following intravenous administration of Tc-72mlabeled red blood cells.  RADIOPHARMACEUTICALS:  25.0 mCi Tc-968mn-vitro labeled red cells.  COMPARISON:  None.  FINDINGS: Physiologic distribution of blood pool activity is seen. No evidence of abnormal radiopharmaceutical accumulation within the GI tract during 120 minutes  imaging time.  IMPRESSION: Negative.  No active gastrointestinal bleeding demonstrated.   Electronically Signed   By: JoEarle Gell.D.   On: 07/25/2015 15:10    Microbiology: Recent Results (from the past 240 hour(s))  MRSA PCR Screening     Status: None   Collection Time: 07/23/15  5:35 AM  Result Value Ref Range Status   MRSA by PCR NEGATIVE NEGATIVE Final    Comment:        The GeneXpert MRSA Assay (FDA approved for NASAL specimens only), is one component of a comprehensive MRSA colonization surveillance program. It is not intended to diagnose MRSA infection nor to guide or monitor treatment for MRSA infections.      Labs: Basic Metabolic Panel:  Recent  Labs Lab 07/23/15 0135 07/26/15 0120 07/28/15 0119  NA 136 136 135  K 3.7 3.6 3.5  CL 98* 103 101  CO2 '30 28 27  '$ GLUCOSE 201* 182* 193*  BUN '13 6 11  '$ CREATININE 0.71 0.71 0.62  CALCIUM 8.8* 8.0* 8.2*   Liver Function Tests:  Recent Labs Lab 07/23/15 0135  AST 21  ALT 17  ALKPHOS 92  BILITOT 0.7  PROT 5.8*  ALBUMIN 3.5   CBC:  Recent Labs Lab 07/23/15 0135  07/27/15 0132 07/27/15 1405 07/28/15 0119 07/28/15 1702 07/29/15 0119  WBC 6.6  --   --   --   --   --   --   NEUTROABS 4.1  --   --   --   --   --   --   HGB 13.4  < > 7.9* 8.3* 7.9* 7.8* 8.0*  HCT 37.6*  < > 22.5* 23.1* 22.8* 22.5* 23.0*  MCV 90.0  --   --   --   --   --   --   PLT 216  --   --   --   --   --   --   < > = values in this interval not displayed.  CBG:  Recent Labs Lab 07/28/15 1213 07/28/15 1658 07/28/15 2034 07/29/15 0726 07/29/15 1132  GLUCAP 223* 260* 272* 186* 268*       Signed:  Anshika Pethtel  [facility] 07/29/2015, 3:12 PM

## 2015-07-29 NOTE — Discharge Instructions (Signed)
Follow with Mathews Argyle, MD in 5-7 days  Please get a complete blood count and chemistry panel checked by your Primary MD at your next visit, and again as instructed by your Primary MD. Please get your medications reviewed and adjusted by your Primary MD.  Please request your Primary MD to go over all Hospital Tests and Procedure/Radiological results at the follow up, please get all Hospital records sent to your Prim MD by signing hospital release before you go home.  If you had Pneumonia of Lung problems at the Hospital: Please get a 2 view Chest X ray done in 6-8 weeks after hospital discharge or sooner if instructed by your Primary MD.  If you have Congestive Heart Failure: Please call your Cardiologist or Primary MD anytime you have any of the following symptoms:  1) 3 pound weight gain in 24 hours or 5 pounds in 1 week  2) shortness of breath, with or without a dry hacking cough  3) swelling in the hands, feet or stomach  4) if you have to sleep on extra pillows at night in order to breathe  Follow cardiac low salt diet and 1.5 lit/day fluid restriction.  If you have diabetes Accuchecks 4 times/day, Once in AM empty stomach and then before each meal. Log in all results and show them to your primary doctor at your next visit. If any glucose reading is under 80 or above 300 call your primary MD immediately.  If you have Seizure/Convulsions/Epilepsy: Please do not drive, operate heavy machinery, participate in activities at heights or participate in high speed sports until you have seen by Primary MD or a Neurologist and advised to do so again.  If you had Gastrointestinal Bleeding: Please ask your Primary MD to check a complete blood count within one week of discharge or at your next visit. Your endoscopic/colonoscopic biopsies that are pending at the time of discharge, will also need to followed by your Primary MD.  Get Medicines reviewed and adjusted. Please take all your  medications with you for your next visit with your Primary MD  Please request your Primary MD to go over all hospital tests and procedure/radiological results at the follow up, please ask your Primary MD to get all Hospital records sent to his/her office.  If you experience worsening of your admission symptoms, develop shortness of breath, life threatening emergency, suicidal or homicidal thoughts you must seek medical attention immediately by calling 911 or calling your MD immediately  if symptoms less severe.  You must read complete instructions/literature along with all the possible adverse reactions/side effects for all the Medicines you take and that have been prescribed to you. Take any new Medicines after you have completely understood and accpet all the possible adverse reactions/side effects.   Do not drive or operate heavy machinery when taking Pain medications.   Do not take more than prescribed Pain, Sleep and Anxiety Medications  Special Instructions: If you have smoked or chewed Tobacco  in the last 2 yrs please stop smoking, stop any regular Alcohol  and or any Recreational drug use.  Wear Seat belts while driving.  Please note You were cared for by a hospitalist during your hospital stay. If you have any questions about your discharge medications or the care you received while you were in the hospital after you are discharged, you can call the unit and asked to speak with the hospitalist on call if the hospitalist that took care of you is not available. Once  you are discharged, your primary care physician will handle any further medical issues. Please note that NO REFILLS for any discharge medications will be authorized once you are discharged, as it is imperative that you return to your primary care physician (or establish a relationship with a primary care physician if you do not have one) for your aftercare needs so that they can reassess your need for medications and monitor your  lab values.  You can reach the hospitalist office at phone [phone] or fax [phone]   If you do not have a primary care physician, you can call [phone] for a physician referral.  Activity: As tolerated with Full fall precautions use walker/cane & assistance as needed  Diet: diabetic  Disposition Home with HHPT

## 2015-07-29 NOTE — Care Management Important Message (Signed)
Important Message  Patient Details  Name: Chad Fuller MRN: [id number] Date of Birth: [birth year]   Medicare Important Message Given:  Yes-third notification given    Delorse Lek 07/29/2015, 12:11 PM

## 2015-07-30 DIAGNOSIS — I10 Essential (primary) hypertension: Secondary | ICD-10-CM | POA: Diagnosis not present

## 2015-07-30 DIAGNOSIS — E119 Type 2 diabetes mellitus without complications: Secondary | ICD-10-CM | POA: Diagnosis not present

## 2015-07-30 DIAGNOSIS — Z9181 History of falling: Secondary | ICD-10-CM | POA: Diagnosis not present

## 2015-07-30 DIAGNOSIS — Z8673 Personal history of transient ischemic attack (TIA), and cerebral infarction without residual deficits: Secondary | ICD-10-CM | POA: Diagnosis not present

## 2015-07-30 DIAGNOSIS — I2510 Atherosclerotic heart disease of native coronary artery without angina pectoris: Secondary | ICD-10-CM | POA: Diagnosis not present

## 2015-07-30 DIAGNOSIS — M6281 Muscle weakness (generalized): Secondary | ICD-10-CM | POA: Diagnosis not present

## 2015-07-30 DIAGNOSIS — Z794 Long term (current) use of insulin: Secondary | ICD-10-CM | POA: Diagnosis not present

## 2015-07-30 DIAGNOSIS — Z85118 Personal history of other malignant neoplasm of bronchus and lung: Secondary | ICD-10-CM | POA: Diagnosis not present

## 2015-07-30 DIAGNOSIS — Z86711 Personal history of pulmonary embolism: Secondary | ICD-10-CM | POA: Diagnosis not present

## 2015-07-30 DIAGNOSIS — K5790 Diverticulosis of intestine, part unspecified, without perforation or abscess without bleeding: Secondary | ICD-10-CM | POA: Diagnosis not present

## 2015-08-01 ENCOUNTER — Ambulatory Visit: Payer: Medicare Other | Admitting: Podiatry

## 2015-08-01 DIAGNOSIS — I10 Essential (primary) hypertension: Secondary | ICD-10-CM | POA: Diagnosis not present

## 2015-08-01 DIAGNOSIS — K5900 Constipation, unspecified: Secondary | ICD-10-CM | POA: Diagnosis not present

## 2015-08-01 DIAGNOSIS — E1140 Type 2 diabetes mellitus with diabetic neuropathy, unspecified: Secondary | ICD-10-CM | POA: Diagnosis not present

## 2015-08-01 DIAGNOSIS — D649 Anemia, unspecified: Secondary | ICD-10-CM | POA: Diagnosis not present

## 2015-08-01 DIAGNOSIS — K922 Gastrointestinal hemorrhage, unspecified: Secondary | ICD-10-CM | POA: Diagnosis not present

## 2015-08-01 DIAGNOSIS — Z794 Long term (current) use of insulin: Secondary | ICD-10-CM | POA: Diagnosis not present

## 2015-08-02 DIAGNOSIS — E119 Type 2 diabetes mellitus without complications: Secondary | ICD-10-CM | POA: Diagnosis not present

## 2015-08-02 DIAGNOSIS — Z794 Long term (current) use of insulin: Secondary | ICD-10-CM | POA: Diagnosis not present

## 2015-08-02 DIAGNOSIS — I2510 Atherosclerotic heart disease of native coronary artery without angina pectoris: Secondary | ICD-10-CM | POA: Diagnosis not present

## 2015-08-02 DIAGNOSIS — M6281 Muscle weakness (generalized): Secondary | ICD-10-CM | POA: Diagnosis not present

## 2015-08-02 DIAGNOSIS — I10 Essential (primary) hypertension: Secondary | ICD-10-CM | POA: Diagnosis not present

## 2015-08-02 DIAGNOSIS — K5790 Diverticulosis of intestine, part unspecified, without perforation or abscess without bleeding: Secondary | ICD-10-CM | POA: Diagnosis not present

## 2015-08-05 NOTE — Patient Outreach (Signed)
[facility]) Care Management  08/05/2015  OSBORN PULLIN 1926/06/14 [id number]   Referral from [place] List, assigned Quinn Plowman, RN to outreach.  Ronnell Freshwater. [place], [place] Management [place] Assistant Phone: [phone] Fax: [phone]

## 2015-08-06 DIAGNOSIS — I2510 Atherosclerotic heart disease of native coronary artery without angina pectoris: Secondary | ICD-10-CM | POA: Diagnosis not present

## 2015-08-06 DIAGNOSIS — Z794 Long term (current) use of insulin: Secondary | ICD-10-CM | POA: Diagnosis not present

## 2015-08-06 DIAGNOSIS — E119 Type 2 diabetes mellitus without complications: Secondary | ICD-10-CM | POA: Diagnosis not present

## 2015-08-06 DIAGNOSIS — M6281 Muscle weakness (generalized): Secondary | ICD-10-CM | POA: Diagnosis not present

## 2015-08-06 DIAGNOSIS — I10 Essential (primary) hypertension: Secondary | ICD-10-CM | POA: Diagnosis not present

## 2015-08-06 DIAGNOSIS — K5790 Diverticulosis of intestine, part unspecified, without perforation or abscess without bleeding: Secondary | ICD-10-CM | POA: Diagnosis not present

## 2015-08-08 DIAGNOSIS — K5790 Diverticulosis of intestine, part unspecified, without perforation or abscess without bleeding: Secondary | ICD-10-CM | POA: Diagnosis not present

## 2015-08-08 DIAGNOSIS — I10 Essential (primary) hypertension: Secondary | ICD-10-CM | POA: Diagnosis not present

## 2015-08-08 DIAGNOSIS — I2510 Atherosclerotic heart disease of native coronary artery without angina pectoris: Secondary | ICD-10-CM | POA: Diagnosis not present

## 2015-08-08 DIAGNOSIS — M6281 Muscle weakness (generalized): Secondary | ICD-10-CM | POA: Diagnosis not present

## 2015-08-08 DIAGNOSIS — E119 Type 2 diabetes mellitus without complications: Secondary | ICD-10-CM | POA: Diagnosis not present

## 2015-08-08 DIAGNOSIS — Z794 Long term (current) use of insulin: Secondary | ICD-10-CM | POA: Diagnosis not present

## 2015-08-12 ENCOUNTER — Other Ambulatory Visit: Payer: Self-pay

## 2015-08-12 NOTE — Patient Outreach (Signed)
[facility]) Care Management  08/12/2015  GATLYN LIPARI [birth year] [id number]  Telephone call to patient regarding high risk list referral.  Patient states he is on the phone with someone and request call back at another time.   PLAN: RNCM will attempt 2nd telephone outreach to patient within 3 business days.  Quinn Plowman RN,BSN,CCM [place] Coordinator [phone]

## 2015-08-13 ENCOUNTER — Other Ambulatory Visit: Payer: Self-pay

## 2015-08-13 DIAGNOSIS — E118 Type 2 diabetes mellitus with unspecified complications: Secondary | ICD-10-CM

## 2015-08-13 DIAGNOSIS — Z794 Long term (current) use of insulin: Secondary | ICD-10-CM | POA: Diagnosis not present

## 2015-08-13 DIAGNOSIS — K5790 Diverticulosis of intestine, part unspecified, without perforation or abscess without bleeding: Secondary | ICD-10-CM | POA: Diagnosis not present

## 2015-08-13 DIAGNOSIS — E119 Type 2 diabetes mellitus without complications: Secondary | ICD-10-CM | POA: Diagnosis not present

## 2015-08-13 DIAGNOSIS — I10 Essential (primary) hypertension: Secondary | ICD-10-CM | POA: Diagnosis not present

## 2015-08-13 DIAGNOSIS — M6281 Muscle weakness (generalized): Secondary | ICD-10-CM | POA: Diagnosis not present

## 2015-08-13 DIAGNOSIS — I2510 Atherosclerotic heart disease of native coronary artery without angina pectoris: Secondary | ICD-10-CM | POA: Diagnosis not present

## 2015-08-13 NOTE — Patient Outreach (Signed)
[facility]) Care Management  08/13/2015  EASTON FETTY May 24, 1926 [id number]   Request from Quinn Plowman, RN to assigned Community RN, assigned Raina Mina, RN.  Thanks, Ronnell Freshwater. [place], [place] Assistant Phone: [phone] Fax: [phone]

## 2015-08-13 NOTE — Patient Outreach (Addendum)
[facility]) Care Management  08/13/2015  Chad Fuller [birth year] [id number]  SUBJECTIVE:  Telephone call to patient regarding high risk assessment. Discussed and offered [facility] care management services.  Patient verbally agreed to receive services.   Patient states he was recently discharged from the hospital for diverticulosis.  Patient states he had a lot of bleeding.  Patient states he has home health following him and he receives therapy.  Patient states he is very weak.  Patient states he ambulates with a cane.  Patient states he had a stroke in November 18, 2014.  Patient states he lives alone. States he does not know how to cook so he eats microwave food or eats out on occasion.  Patient states he has lost 34lbs since his stroke. States he does not eat much.  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   Patient reports he takes his medications and insulin.  Patient states, " I have an awful time trying to get my prescriptions filled.  Sometimes I run out."  Patient states I have to talk with Dr. Felipa Eth and /or try to speak with Dr. Sherryll Burger nurse about my prescriptions because my insurance will only pay for 3 month supply.  Patient state, "it worries me so bad about my medicines and having to run out at time."  Patient states he has sustained a fall approximately 3 weeks.  States he had to many things in his hand and fell.  Patient state his balance has not been to good since he had the stroke.  Patient states the stroke affected his right side and he also has a problems with swallowing.  Patient states he has to be very careful when he is eating.   Patient states he has swelling in his feet bilaterally. Patient state sometimes his feet swell so much he is unable to get his shoes on.  Patient state he tries to elevate his feet as much as possible.   Patient states he is schedule to have lab work  done at Dr. Sherryll Burger office on tomorrow 08/14/15.  States last visit with Dr. Felipa Eth was 08/01/15.   ASSESSMENT: High risk referral.  Patient will benefit from community nurse for diabetes education/ management, home falls safety assessment,  medications reconciliation (assess medication barrier due to patient running out of medications frequently), and nutrition assessment.  Patient with chronic health condition with daily management challenge, lacks social support Patient with recent discharge from hospital 07/23/15 through 07/29/15 for GI bleed.  Patient presently being followed by home health for therapy but unable to report name of agency.   PLAN: RNCM will refer to community case manager.   Quinn Plowman RN,BSN,CCM [place] Coordinator [phone]

## 2015-08-14 ENCOUNTER — Other Ambulatory Visit: Payer: Self-pay | Admitting: *Deleted

## 2015-08-14 DIAGNOSIS — D649 Anemia, unspecified: Secondary | ICD-10-CM | POA: Diagnosis not present

## 2015-08-14 NOTE — Patient Outreach (Signed)
[facility]) Care Management  08/14/2015  Chad Fuller Sep 15, 1926 [id number]  Initial outreach call today via community RN for possible involvement however pt not available to both home or mobile contacts listed. RN unable to leave a generic voice message to request a call back. Will continue outreach calls accordingly.  Raina Mina, RN Care Management Coordinator [place] [facility] Main Office [phone]

## 2015-08-15 DIAGNOSIS — E119 Type 2 diabetes mellitus without complications: Secondary | ICD-10-CM | POA: Diagnosis not present

## 2015-08-15 DIAGNOSIS — M6281 Muscle weakness (generalized): Secondary | ICD-10-CM | POA: Diagnosis not present

## 2015-08-15 DIAGNOSIS — I10 Essential (primary) hypertension: Secondary | ICD-10-CM | POA: Diagnosis not present

## 2015-08-15 DIAGNOSIS — Z794 Long term (current) use of insulin: Secondary | ICD-10-CM | POA: Diagnosis not present

## 2015-08-15 DIAGNOSIS — K5790 Diverticulosis of intestine, part unspecified, without perforation or abscess without bleeding: Secondary | ICD-10-CM | POA: Diagnosis not present

## 2015-08-15 DIAGNOSIS — I2510 Atherosclerotic heart disease of native coronary artery without angina pectoris: Secondary | ICD-10-CM | POA: Diagnosis not present

## 2015-08-20 ENCOUNTER — Other Ambulatory Visit: Payer: Self-pay | Admitting: *Deleted

## 2015-08-20 DIAGNOSIS — E119 Type 2 diabetes mellitus without complications: Secondary | ICD-10-CM | POA: Diagnosis not present

## 2015-08-20 DIAGNOSIS — M6281 Muscle weakness (generalized): Secondary | ICD-10-CM | POA: Diagnosis not present

## 2015-08-20 DIAGNOSIS — Z794 Long term (current) use of insulin: Secondary | ICD-10-CM | POA: Diagnosis not present

## 2015-08-20 DIAGNOSIS — K5790 Diverticulosis of intestine, part unspecified, without perforation or abscess without bleeding: Secondary | ICD-10-CM | POA: Diagnosis not present

## 2015-08-20 DIAGNOSIS — I10 Essential (primary) hypertension: Secondary | ICD-10-CM | POA: Diagnosis not present

## 2015-08-20 DIAGNOSIS — I2510 Atherosclerotic heart disease of native coronary artery without angina pectoris: Secondary | ICD-10-CM | POA: Diagnosis not present

## 2015-08-20 NOTE — Patient Outreach (Signed)
[facility]) Care Management  08/20/2015  Chad Fuller [birth year] [id number]  RN spoke with pt today concerning [facility] services and possible community home visits. RN reintroduced the [facility] services and possible involvement in assisting pt with managing her health better with available resources and education on his medical condition. Pt receptive and provides RN with some of his blood sugar readings over the last two mornings. States yesterday reading was 87 and this morning it was 222. Briefly discussed dietary measures and limiting carbohydrates to assist with reducing his overall BS. Offered further assistance with a community home visit to further assist pt with managing his diabetes. Pt receptive to a home visit that will be scheduled accordingly to pt's available time and day. RN also inquired on support system in the home as pt responded he "lives alone". Due to a past stroke pt has had a fall due to both of his hands with garbage bags and lost his balance and fell. No reported injuries and no ramp attached to his front steps where the incident occurred. Another topic discussed was pt's medications and his awareness of the type of medications he was taking and purpose. Pt states he does not "anything about the medications he is taking but interested". RN offered to educate pt on his medications and requested pt have all medication both prescription and over the counter on the initial home visit date that will be scheduled to offically enroll pt into a [facility] program for ongoing case management services. A plan of care was discussed along with goals that have been initiated for case management involvement. RN will address the following on the initial home visit (diabetic education and improvement with self management of care, possible referral for social worker for community resources and safety measures to the home environment to accommodate the pt in his home and medication procurement  with education on prescribed medications).  No other request or inquires at this time.  Raina Mina, RN Care Management Coordinator [place] [facility] Main Office [phone]

## 2015-08-27 DIAGNOSIS — H524 Presbyopia: Secondary | ICD-10-CM | POA: Diagnosis not present

## 2015-08-27 DIAGNOSIS — H26493 Other secondary cataract, bilateral: Secondary | ICD-10-CM | POA: Diagnosis not present

## 2015-08-29 ENCOUNTER — Encounter: Payer: Self-pay | Admitting: *Deleted

## 2015-08-29 ENCOUNTER — Other Ambulatory Visit: Payer: Self-pay | Admitting: *Deleted

## 2015-08-29 NOTE — Patient Outreach (Signed)
[facility]) Care Management   08/29/2015  Chad Fuller 18-Mar-1926 [id number]  Chad Fuller is an 81 y.o. male  Subjective:   Diabetes: Pt reports he continues to monitor his blood sugars 2-3 times daily. Reports most readings around 55 low to 250 high. Pt states his A1C is good but not able not able to recite the read. Pt reports he doesn't know a lot about diabetes but able to monitor his daily CBGs.  Pt not aware of the term or importance of "sick days" but receptive to additional education. NUTRITION: Pt reports his dietary habits and eating habits related to his CBGs. Pt recites his meals and states he does not eat lunch and very few snacks throughout the day. States he is "just not hungry" when he misses his meals.  MEDICATION: Pt reports he tries to take all his medications as prescribed and uses a pill boxes and request  assistance filling his box today. EDEMA: Pt states he has some swelling to her lower ankles since his stroke but the swelling has just occurred over the last 2 months. Reports history of swelling to his legs since last year isolated now to his ankles.    Objective:   Review of Systems  Constitutional: Negative.   HENT: Negative.   Eyes: Negative.   Respiratory: Negative.   Cardiovascular: Negative.   Gastrointestinal: Negative.   Genitourinary: Negative.   Musculoskeletal: Negative.   Skin: Negative.   Neurological: Negative.   Endo/Heme/Allergies: Negative.   Psychiatric/Behavioral: Negative.     Physical Exam  Constitutional: He is oriented to person, place, and time. He appears well-developed and well-nourished.  HENT:  Right Ear: External ear normal.  Left Ear: External ear normal.  Nose: Nose normal.  Neck: Normal range of motion.  Cardiovascular: Normal rate and regular rhythm.   Respiratory: Effort normal and breath sounds normal.  GI: Soft. Bowel sounds are normal.  Musculoskeletal: Normal range of motion.  Neurological:  He is alert and oriented to person, place, and time.  Skin: Skin is warm and dry.  Psychiatric: He has a normal mood and affect. His behavior is normal. Judgment and thought content normal.    Current Medications:   Current Outpatient Prescriptions  Medication Sig Dispense Refill  . aspirin 81 MG tablet Take 81 mg by mouth daily.    Marland Kitchen ezetimibe (ZETIA) 10 MG tablet Take 10 mg by mouth daily.    Marland Kitchen GLUCOSAMINE-CHONDROITIN PO Take 2 tablets by mouth every morning.    . insulin glargine (LANTUS) 100 UNIT/ML injection Inject 0.5 mLs (50 Units total) into the skin daily. 10 mL 11  . losartan-hydrochlorothiazide (HYZAAR) 50-12.5 MG per tablet Take 1 tablet by mouth daily.    . metFORMIN (GLUCOPHAGE-XR) 500 MG 24 hr tablet Take 500 mg with breakfast and 1000 mg with supper (Patient taking differently: Take 500 mg by mouth daily with breakfast. Take 500 mg with breakfast and 1000 mg with supper)    . Multiple Vitamins-Minerals (MULTIVITAMINS THER. W/MINERALS) TABS Take 1 tablet by mouth daily.      . pantoprazole (PROTONIX) 40 MG tablet Take 1 tablet (40 mg total) by mouth daily.    . polyethylene glycol (MIRALAX / GLYCOLAX) packet Take 17 g by mouth daily as needed for mild constipation. 30 each 1  . tamsulosin (FLOMAX) 0.4 MG CAPS capsule Take 0.4 mg by mouth daily.     . meloxicam (MOBIC) 7.5 MG tablet Take 7.5 mg by mouth daily.  No current facility-administered medications for this visit.    Functional Status:   In your present state of health, do you have any difficulty performing the following activities: 08/29/2015 07/23/2015  Hearing? N Y  Vision? N N  Difficulty concentrating or making decisions? N N  Walking or climbing stairs? Y Y  Dressing or bathing? Y N  Doing errands, shopping? Y N  Preparing Food and eating ? N -  Using the Toilet? Y -  In the past six months, have you accidently leaked urine? N -  Do you have problems with loss of bowel control? N -  Managing your  Medications? N -  Managing your Finances? N -  Housekeeping or managing your Housekeeping? N -    Fall/Depression Screening:    PHQ 2/9 Scores 08/29/2015 03/21/2014  PHQ - 2 Score 0 0    Assessment:   Introduction to [facility] related to enrollment Case management related to diabetes Nutritional needs related to healthy eating habits Medication procurement related to pill box fill Bilateral edema related lower legs  Plan:  Discussed [facility] program and enrolled pt into the diabetes program with a signed consent.  Discussed in detail diabetes and provided educational EMMI print outs on the following:  -Diabetes preventing Heart Attack and Stroke -Type 2 Diabetes Diet -Diabetes controlling blood sugar. Also provided [facility] calendar and requested pt to document all readings for providers to view.  Sick days discussed for educational purposes and the importance of annual eye exams, vision checks and quarterly A1C in regulating his glucose levels.  Reviewed and educated pt on all his medications and fill pt's pill box for the next 2 weeks. Pt states he is able to continue filling his medication box and reports he gets 90 days on his refills on all his medications from [facility]. No other issues or request at this time as pt able to recite the purpose of all her medications teach back method on his daily medications.  Discussed fluid retention and compression stockings. Pt interested in obtaining compression stockings (knee high). RN provided several resources as pt prefers to get from [facility] at this time. RN contacted Dr. Felipa Eth and left confidential voice message request a prescription at pt's request for compression stockings and education on how to apply and when to remove. Currently awaiting on call back to provide compression stockings. Pt has also requested [employer] address and contact number for future supplies if needed. Measurements completed and provided to pt for further sizing to right leg  (length 17 1/2, ankle 10 1/2 and calf 13 1/2) for thigh length.  Discussed plan of care and goals that pt has agreed upon. Will re-evaluate all goals and pt's progress related to the changes discussed above. Will follow up in one month with the assess the above issues.   Raina Mina, RN Care Management Coordinator [place] [facility] Main Office [phone]

## 2015-09-03 ENCOUNTER — Encounter (HOSPITAL_COMMUNITY): Payer: Self-pay | Admitting: Gastroenterology

## 2015-09-04 ENCOUNTER — Other Ambulatory Visit: Payer: Self-pay | Admitting: *Deleted

## 2015-09-04 NOTE — Patient Outreach (Signed)
[facility]) Care Management  09/04/2015  Chad Fuller 1926-07-02 [id number]   RN spoke with pt today and informed pt pt the order received via Dr. Felipa Eth concerning the compression patterns on the requested compression stockings. [facility] will be able to order the requested item however RN verified with pt based upon the requested order for knee high stockings with 20-30 mm (size will be Medium). Pt has agreed and requested RN to deliver on the next appointed schedule in a few weeks. RN has informed pt if swelling continues to increase to notify his provider. No additional request or inquires at this time.   Raina Mina, RN Care Management Coordinator [place] [facility] Main Office [phone]

## 2015-09-12 DIAGNOSIS — L570 Actinic keratosis: Secondary | ICD-10-CM | POA: Diagnosis not present

## 2015-09-12 DIAGNOSIS — L814 Other melanin hyperpigmentation: Secondary | ICD-10-CM | POA: Diagnosis not present

## 2015-09-12 DIAGNOSIS — D1801 Hemangioma of skin and subcutaneous tissue: Secondary | ICD-10-CM | POA: Diagnosis not present

## 2015-09-12 DIAGNOSIS — L821 Other seborrheic keratosis: Secondary | ICD-10-CM | POA: Diagnosis not present

## 2015-09-12 DIAGNOSIS — D225 Melanocytic nevi of trunk: Secondary | ICD-10-CM | POA: Diagnosis not present

## 2015-09-26 ENCOUNTER — Ambulatory Visit (INDEPENDENT_AMBULATORY_CARE_PROVIDER_SITE_OTHER): Payer: Medicare Other | Admitting: Podiatry

## 2015-09-26 ENCOUNTER — Encounter: Payer: Self-pay | Admitting: Podiatry

## 2015-09-26 ENCOUNTER — Ambulatory Visit (INDEPENDENT_AMBULATORY_CARE_PROVIDER_SITE_OTHER): Payer: Medicare Other

## 2015-09-26 VITALS — BP 142/86 | HR 64 | Resp 12

## 2015-09-26 DIAGNOSIS — R52 Pain, unspecified: Secondary | ICD-10-CM | POA: Diagnosis not present

## 2015-09-26 DIAGNOSIS — M779 Enthesopathy, unspecified: Secondary | ICD-10-CM | POA: Diagnosis not present

## 2015-09-26 DIAGNOSIS — M79672 Pain in left foot: Secondary | ICD-10-CM

## 2015-09-26 DIAGNOSIS — R609 Edema, unspecified: Secondary | ICD-10-CM | POA: Diagnosis not present

## 2015-09-26 DIAGNOSIS — B351 Tinea unguium: Secondary | ICD-10-CM

## 2015-09-27 NOTE — Progress Notes (Signed)
He presents today with a chief complaint of swelling to the bilateral lower extremities. He states has been gone on for 2 weeks or more. He goes on to say that he has a pair of compression hose which he does not wear because her too hard to get on. He denies any changes in his past medical history medications allergies surgeries or social history.  Objective: Vital signs are stable alert and oriented 3. Pulses are strongly palpable. Neurologic sensorium is intact for Semmes-Weinstein monofilament. He has pitting edema to the bilateral lower extremities +3. Pulses are palpable nails are thick yellow dystrophic likely mycotic. Normal sensation to the bilateral foot. No open lesions or wounds.  Assessment: Diabetes mellitus venous insufficiency. Pain in limb secondary to onychomycosis.  Plan: Discussed etiology pathology conservative versus surgical therapies. I recommended that he continue to keep his feet elevated as much as possible utilize his compression hose. I debrided his nails laterally as a covered service. Follow up with him in 3 months.  Roselind Messier DPM

## 2015-10-01 DIAGNOSIS — Z23 Encounter for immunization: Secondary | ICD-10-CM | POA: Diagnosis not present

## 2015-10-04 ENCOUNTER — Other Ambulatory Visit: Payer: Self-pay | Admitting: *Deleted

## 2015-10-04 NOTE — Patient Outreach (Signed)
[facility] [facility]) Care Management  10/04/2015  JOURDAN DURBIN [birth year] [id number]   RN contacted Dr. Carlyle Lipa office and left message via confidential to [place], [place] indicating pt has declined to wear the prescribed compression stockings ordered by Dr. Felipa Eth on last month via [facility] office has ordered. Pt has a follow up appointment with DR. Stoneking's office on 10/11 and RN requested this issues be address further at that time. No further request from pt at this time.   Raina Mina, RN Care Management Coordinator [place] [facility] Main Office [phone]

## 2015-10-04 NOTE — Patient Outreach (Signed)
[facility]) Care Management   10/04/2015  Chad Fuller 06-12-1926 [id number]  Chad Fuller is an 81 y.o. male  Subjective:  NUTRITION: Pt states he is eating habit food items and has been eating three meals daily as discussed on last home visit. Pt reports his breakfast/lunch/dinner food items and blood sugar ranges from 100-120 with this morning's read of 133. Pt mentioned some items that contain heavy carbohydrate food items however not consumed that often. Pt aware not to consume the entire treat and states on occassions it takes all week to eat his "sweet" item from the donut shop.  SAFETY: Pt reports several incidents of falls however Dr. Felipa Eth his aware and pt has been told due to his history of a stroke in the past and vertigo he may have this condition. Pt aware he needs to use assisted devices to prevent falls or/and injuries. Pt has refused ramps and or assisted devices and refused to discussed possible increase in his level of care for precautionary measures.  EDEMA:  Pt indicated he does not wish to wear the compression stockings however refused to wear due to to how difficult it is to remove.  Pt decided not to wear to stockings however will elevate his legs to keep the welling down.  DIABETES: Pt reports his blood sugar readings and ranges over the last month. Stats his A1C is good and reports he is more regulated with his readings. Pt continues to take his diabetic medications and eat healthy. Pt also continues to take his CBG several time during the day for ongoing monitoring.   Objective:   Review of Systems  Constitutional: Negative.   HENT: Negative.   Eyes: Negative.   Respiratory: Negative.   Cardiovascular: Negative.   Gastrointestinal: Negative.   Genitourinary: Negative.   Musculoskeletal: Negative.   Skin: Negative.   Endo/Heme/Allergies: Negative.   Psychiatric/Behavioral: Negative.     Physical Exam  Constitutional: He is oriented  to person, place, and time. He appears well-developed and well-nourished.  HENT:  Right Ear: External ear normal.  Left Ear: External ear normal.  Eyes: EOM are normal.  Neck: Normal range of motion.  Cardiovascular: Normal rate.   Respiratory: Effort normal and breath sounds normal.  GI: Soft. Bowel sounds are normal.  Musculoskeletal: Normal range of motion.  History of imbalance.  Neurological: He is alert and oriented to person, place, and time.  Skin: Skin is warm and dry.  Psychiatric: He has a normal mood and affect. His behavior is normal. Judgment and thought content normal.    Current Medications:   Current Outpatient Prescriptions  Medication Sig Dispense Refill  . aspirin 81 MG tablet Take 81 mg by mouth daily.    Marland Kitchen ezetimibe (ZETIA) 10 MG tablet Take 10 mg by mouth daily.    Marland Kitchen GLUCOSAMINE-CHONDROITIN PO Take 2 tablets by mouth every morning.    . insulin glargine (LANTUS) 100 UNIT/ML injection Inject 0.5 mLs (50 Units total) into the skin daily. 10 mL 11  . losartan-hydrochlorothiazide (HYZAAR) 50-12.5 MG per tablet Take 1 tablet by mouth daily.    . meloxicam (MOBIC) 7.5 MG tablet Take 7.5 mg by mouth daily.    . metFORMIN (GLUCOPHAGE-XR) 500 MG 24 hr tablet Take 500 mg with breakfast and 1000 mg with supper (Patient taking differently: Take 500 mg by mouth daily with breakfast. Take 500 mg with breakfast and 1000 mg with supper)    . Multiple Vitamins-Minerals (MULTIVITAMINS THER. W/MINERALS) TABS Take  1 tablet by mouth daily.      . pantoprazole (PROTONIX) 40 MG tablet Take 1 tablet (40 mg total) by mouth daily.    . polyethylene glycol (MIRALAX / GLYCOLAX) packet Take 17 g by mouth daily as needed for mild constipation. 30 each 1  . tamsulosin (FLOMAX) 0.4 MG CAPS capsule Take 0.4 mg by mouth daily.      No current facility-administered medications for this visit.    Functional Status:   In your present state of health, do you have any difficulty performing the  following activities: 08/29/2015 07/23/2015  Hearing? N Y  Vision? N N  Difficulty concentrating or making decisions? N N  Walking or climbing stairs? Y Y  Dressing or bathing? Y N  Doing errands, shopping? Y N  Preparing Food and eating ? N -  Using the Toilet? Y -  In the past six months, have you accidently leaked urine? N -  Do you have problems with loss of bowel control? N -  Managing your Medications? N -  Managing your Finances? N -  Housekeeping or managing your Housekeeping? N -    Fall/Depression Screening:    PHQ 2/9 Scores 08/29/2015 03/21/2014  PHQ - 2 Score 0 0   Filed Vitals:   10/04/15 1411  BP: 120/60  Pulse: 73  Resp: 20    Assessment:   Follow up on nutritional issues related to healthy eating habits. Safety issues related to imbalance and risk of falls Bilateral swelling related to lower legs Ongoing case management related to diabetes Plan:  Physical assessment completed with no acute issues mentioned or found on today's assessment. Will verify pt's progress on better eating habits and improvement on increasing to three meals daily in regulating his diabetes.  Pt continues to work with health food items and better choices. Pt will understanding of what food items increases his glucose levels.  Will discussed pt's use of his assisted devices and strongly encouraged pt to use other devices that may prevent falls or injuries. Will discussed outside ramp and use of his rolling walker. Will also introduce level of care facilities such as assisted living and supervised independent living facility (pt without interest at this time and refused information).  Will verify pt awareness if no safety equipment is use the outcome of possible risk of injuries. Will discussed lower legs swelling and strongly encouraged pt to elevated  His lower to reduce ongoing swelling and prevent further swelling from occurring. Also discussed and offered prescribed TED however pt refused due  to the difficultly in removing the compression stockings. Will notify Dr. Felipa Eth that pt decided not to wear the compression stockings as ordered.  Will verify pt continues to completed glucometer check and request readings (ranging from 100-120 on average with this morning's read at 133). Verified pt's readings have been more regulated with a good A1C. Verified pt takes his medication as prescribed with no delays and continues to follow Dr. Felipa Eth  With his next appointment on 10/11. Will discussed and review pt's plan of care and re-evaluate pt's goals and adjust accordingly to allow pt's adherence with an allow amount of time. Will revisit next month and discuss pt's progress.  Raina Mina, RN Care Management Coordinator [place] [facility] Main Office [phone]

## 2015-10-04 NOTE — Patient Outreach (Signed)
[facility]) Care Management  10/04/2015  Chad Fuller 08-29-1926 [id number]  Pt not at home with today's scheduled home visit. RN contacted pt who requested RN to wait at his home approximately 5 -10 minutes to proceed with today's home visit appointment. RN will comply and completed today's appointment.  Raina Mina, RN Care Management Coordinator [place] [facility] Main Office [phone]

## 2015-10-08 DIAGNOSIS — R0600 Dyspnea, unspecified: Secondary | ICD-10-CM | POA: Diagnosis not present

## 2015-10-08 DIAGNOSIS — Z79899 Other long term (current) drug therapy: Secondary | ICD-10-CM | POA: Diagnosis not present

## 2015-10-08 DIAGNOSIS — D649 Anemia, unspecified: Secondary | ICD-10-CM | POA: Diagnosis not present

## 2015-10-08 DIAGNOSIS — E1140 Type 2 diabetes mellitus with diabetic neuropathy, unspecified: Secondary | ICD-10-CM | POA: Diagnosis not present

## 2015-10-08 DIAGNOSIS — I10 Essential (primary) hypertension: Secondary | ICD-10-CM | POA: Diagnosis not present

## 2015-11-01 ENCOUNTER — Other Ambulatory Visit: Payer: Self-pay | Admitting: *Deleted

## 2015-11-01 NOTE — Patient Outreach (Signed)
[facility]) Care Management   11/01/2015  Chad Fuller Sep 23, 1926 [id number]  Chad Fuller is an 81 y.o. male  Subjective:  DM: Pt provided glucometer with 30 averages on 07/10/29 read outs and continues to report daily glucometer checks with this morning reading at 109. Pt reports his readings have been stable to no high or low readings and he continues to take all her medications as prescribed with no delays. Pt feels he is managing his diabetes much better then before [facility] services.  SWELLING: Pt reports ongoing swelling to his lower ankles and continues to refuse to wear compression stockings even after a discussed with his provider. Pt states he can get the stockings on in the morning but difficult removing at night so he no longer wears the compression stockings. Pt has refused to wear compression stockings at this time.  After further discussion today with RN pt will inquire on zipper compression stockings at the place were he purchased the OTC compression stocking in [place].   SAFETY: Pt reports no falls or injuries since RN last visit as he continues to use her cane inside and outside the home to prevent from falling.  Objective:   Review of Systems  Constitutional: Negative.   HENT: Negative.   Eyes: Negative.   Respiratory: Negative.   Cardiovascular: Negative.   Gastrointestinal: Negative.   Genitourinary: Negative.   Musculoskeletal: Negative.   Skin: Negative.   Neurological: Negative.   Endo/Heme/Allergies: Negative.   Psychiatric/Behavioral: Negative.   All other systems reviewed and are negative.   Physical Exam  Constitutional: He is oriented to person, place, and time. He appears well-developed and well-nourished.  Neck: Normal range of motion.  Cardiovascular: Normal heart sounds.   Respiratory: Effort normal and breath sounds normal.  GI: Soft. Bowel sounds are normal.  Musculoskeletal: He exhibits edema.  Edema to bilateral lower  extremities (1+)  Neurological: He is alert and oriented to person, place, and time.  Skin: Skin is warm and dry.  Psychiatric: He has a normal mood and affect. His behavior is normal. Judgment and thought content normal.    Current Medications:   Current Outpatient Prescriptions  Medication Sig Dispense Refill  . aspirin 81 MG tablet Take 81 mg by mouth daily.    Marland Kitchen ezetimibe (ZETIA) 10 MG tablet Take 10 mg by mouth daily.    Marland Kitchen GLUCOSAMINE-CHONDROITIN PO Take 2 tablets by mouth every morning.    . insulin glargine (LANTUS) 100 UNIT/ML injection Inject 0.5 mLs (50 Units total) into the skin daily. 10 mL 11  . losartan-hydrochlorothiazide (HYZAAR) 50-12.5 MG per tablet Take 1 tablet by mouth daily.    . meloxicam (MOBIC) 7.5 MG tablet Take 7.5 mg by mouth daily.    . metFORMIN (GLUCOPHAGE-XR) 500 MG 24 hr tablet Take 500 mg with breakfast and 1000 mg with supper (Patient taking differently: Take 500 mg by mouth daily with breakfast. Take 500 mg with breakfast and 1000 mg with supper)    . Multiple Vitamins-Minerals (MULTIVITAMINS THER. W/MINERALS) TABS Take 1 tablet by mouth daily.      . pantoprazole (PROTONIX) 40 MG tablet Take 1 tablet (40 mg total) by mouth daily.    . polyethylene glycol (MIRALAX / GLYCOLAX) packet Take 17 g by mouth daily as needed for mild constipation. 30 each 1  . tamsulosin (FLOMAX) 0.4 MG CAPS capsule Take 0.4 mg by mouth daily.      No current facility-administered medications for this visit.  Functional Status:   In your present state of health, do you have any difficulty performing the following activities: 11/01/2015 08/29/2015  Hearing? N N  Vision? N N  Difficulty concentrating or making decisions? N N  Walking or climbing stairs? Y Y  Dressing or bathing? Y Y  Doing errands, shopping? [place]  Preparing Food and eating ? N N  Using the Toilet? Y Y  In the past six months, have you accidently leaked urine? N N  Do you have problems with loss of bowel  control? N N  Managing your Medications? N N  Managing your Finances? N N  Housekeeping or managing your Housekeeping? N N    Fall/Depression Screening:    PHQ 2/9 Scores 11/01/2015 08/29/2015 03/21/2014  PHQ - 2 Score 0 0 0   Filed Vitals:   11/01/15 1431  BP: 118/64  Pulse: 84  Resp: 20    Assessment:   Ongoing case management related to diabetes Follow up ongoing swelling to lower legs Follow up on safety related to history of falls Plan:  Physical assessment completed with no acute symptoms as pt able to recite teach back method on signs and symptoms and what to do if acute symptom should occur. Will verify pt continues to completed finger sticks daily as recommended and view glucometer for 07/10/29 averages on home meter. Will review SICK day if experienced with glucose readings if elevated and why this happens.Will verify pt's awareness of hypo- hyperglycemia and what to do if acute symptoms should occur.  Will review all goals and continue to encouraged adherence.  Will strongly encourage pt to elevate his lower legs to reduce ongoing swelling and again educate pt on compression stockings (pt refuses). Discussed zipper compression stockings and provided a resource of inquired ([employer]). Discussed the purpose of compression stockings and the benefits of wearing during the day and again removing at night. Will verify no falls or injuries over the last month and RN will continue to encouraged pt on safety measures both inside and outside the home with use of cane and other assistance devices as needed.  Will review pt's plan of care and goals adjusted accordingly to allow adherence with managing his care better. Will follow up next month with plans to discharge based upon pt's adherence and management of care concerning his diabetes.   Raina Mina, RN Care Management Coordinator [place] [facility] Main Office [phone]

## 2015-11-11 ENCOUNTER — Encounter: Payer: Self-pay | Admitting: *Deleted

## 2015-11-11 ENCOUNTER — Other Ambulatory Visit: Payer: Self-pay | Admitting: *Deleted

## 2015-11-11 NOTE — Patient Outreach (Signed)
[place] [facility]) Care Management  11/11/2015  Chad Fuller June 15, 1926 [id number]   Notification from Raina Mina, RN to close case due to goals met with [place] Management.  Thanks, Ronnell Freshwater. [place], [place] Assistant Phone: [phone] Fax: [phone]

## 2015-11-11 NOTE — Patient Outreach (Signed)
[place] [facility]) Care Management  11/11/2015  DAIVIK OVERLEY [birth year] [id number]   Discharge [facility] services RN spoke with pt today concerning his progress as pt had requested one another visit next month prior to bing discharged via [facility] services however pt feels he is doing well enough to be discharge at this time. RN reviewed all goals related to his plan of care and pt has successfully met all goals. Pt states no falls and states his glucose readings are good with no encountered problems. After review of all goals that pt has met RN will discharge pt via [place] and notify Dr. Felipa Eth of pt's discharge from the [facility] program and services.  No other issues or problems to address as pt requested to contact this RN if any issues in the future (verified). Case will be closed.  Raina Mina, RN Care Management Coordinator [place] [facility] Main Office [phone]

## 2015-11-29 ENCOUNTER — Ambulatory Visit: Payer: Medicare Other | Admitting: *Deleted

## 2015-12-10 DIAGNOSIS — E119 Type 2 diabetes mellitus without complications: Secondary | ICD-10-CM | POA: Diagnosis not present

## 2015-12-10 DIAGNOSIS — R609 Edema, unspecified: Secondary | ICD-10-CM | POA: Diagnosis not present

## 2015-12-10 DIAGNOSIS — Z794 Long term (current) use of insulin: Secondary | ICD-10-CM | POA: Diagnosis not present

## 2015-12-10 DIAGNOSIS — I10 Essential (primary) hypertension: Secondary | ICD-10-CM | POA: Diagnosis not present

## 2015-12-26 ENCOUNTER — Ambulatory Visit: Payer: Medicare Other | Admitting: Podiatry

## 2016-01-02 ENCOUNTER — Encounter: Payer: Self-pay | Admitting: Podiatry

## 2016-01-02 ENCOUNTER — Ambulatory Visit (INDEPENDENT_AMBULATORY_CARE_PROVIDER_SITE_OTHER): Payer: Medicare Other | Admitting: Podiatry

## 2016-01-02 DIAGNOSIS — B351 Tinea unguium: Secondary | ICD-10-CM

## 2016-01-02 DIAGNOSIS — M79676 Pain in unspecified toe(s): Secondary | ICD-10-CM

## 2016-01-02 NOTE — Progress Notes (Signed)
He presents today for a chief complaint of painful elongated toenails. He states that he is unable to cut his nails secondary to a stroke that he had September 2016.  Objective: Vital signs are stable he is alert and oriented 3. Pulses are strongly palpable. His toenails are thick yellow dystrophic with mycotic and painful palpation with hammertoe deformities. Assessment: Pain in limb second onychomycosis 1 through 5 bilateral.  Plan: Debridement of all reactive hyperkeratotic and debridement of nails 1 through 5 bilateral.

## 2016-01-14 DIAGNOSIS — Z79899 Other long term (current) drug therapy: Secondary | ICD-10-CM | POA: Diagnosis not present

## 2016-02-05 DIAGNOSIS — E1140 Type 2 diabetes mellitus with diabetic neuropathy, unspecified: Secondary | ICD-10-CM | POA: Diagnosis not present

## 2016-02-05 DIAGNOSIS — E7800 Pure hypercholesterolemia, unspecified: Secondary | ICD-10-CM | POA: Diagnosis not present

## 2016-02-05 DIAGNOSIS — M79673 Pain in unspecified foot: Secondary | ICD-10-CM | POA: Diagnosis not present

## 2016-02-05 DIAGNOSIS — J449 Chronic obstructive pulmonary disease, unspecified: Secondary | ICD-10-CM | POA: Diagnosis not present

## 2016-02-05 DIAGNOSIS — I10 Essential (primary) hypertension: Secondary | ICD-10-CM | POA: Diagnosis not present

## 2016-02-05 DIAGNOSIS — Z794 Long term (current) use of insulin: Secondary | ICD-10-CM | POA: Diagnosis not present

## 2016-02-05 DIAGNOSIS — Z79899 Other long term (current) drug therapy: Secondary | ICD-10-CM | POA: Diagnosis not present

## 2016-04-01 DIAGNOSIS — J209 Acute bronchitis, unspecified: Secondary | ICD-10-CM | POA: Diagnosis not present

## 2016-04-02 ENCOUNTER — Ambulatory Visit: Payer: Medicare Other | Admitting: Podiatry

## 2016-04-21 ENCOUNTER — Ambulatory Visit: Payer: Medicare Other | Admitting: Podiatry

## 2016-04-28 ENCOUNTER — Encounter: Payer: Self-pay | Admitting: Podiatry

## 2016-04-28 ENCOUNTER — Ambulatory Visit (INDEPENDENT_AMBULATORY_CARE_PROVIDER_SITE_OTHER): Payer: Medicare Other | Admitting: Podiatry

## 2016-04-28 DIAGNOSIS — M79676 Pain in unspecified toe(s): Secondary | ICD-10-CM | POA: Diagnosis not present

## 2016-04-28 DIAGNOSIS — B351 Tinea unguium: Secondary | ICD-10-CM | POA: Diagnosis not present

## 2016-04-28 NOTE — Progress Notes (Signed)
He presents today with a chief complaint of painful elongated toenails 1 through 5 bilateral.  Objective: Vital signs stable alert and oriented 3. Pulses are palpable. Neurologic sensorium is intact. Nails are thick yellow dystrophic onychomycotic and painful palpation.  Assessment: Pain in limb secondary to onychomycosis.  Plan: Discussed etiology pathology conservative versus surgical therapies. Debrided toenails 1 through 5 bilateral. Follow up with him in 3 months.

## 2016-05-11 DIAGNOSIS — Z7984 Long term (current) use of oral hypoglycemic drugs: Secondary | ICD-10-CM | POA: Diagnosis not present

## 2016-05-11 DIAGNOSIS — Z79899 Other long term (current) drug therapy: Secondary | ICD-10-CM | POA: Diagnosis not present

## 2016-05-11 DIAGNOSIS — E1140 Type 2 diabetes mellitus with diabetic neuropathy, unspecified: Secondary | ICD-10-CM | POA: Diagnosis not present

## 2016-05-11 DIAGNOSIS — Z1389 Encounter for screening for other disorder: Secondary | ICD-10-CM | POA: Diagnosis not present

## 2016-05-11 DIAGNOSIS — G8929 Other chronic pain: Secondary | ICD-10-CM | POA: Diagnosis not present

## 2016-05-11 DIAGNOSIS — J449 Chronic obstructive pulmonary disease, unspecified: Secondary | ICD-10-CM | POA: Diagnosis not present

## 2016-05-11 DIAGNOSIS — I10 Essential (primary) hypertension: Secondary | ICD-10-CM | POA: Diagnosis not present

## 2016-05-11 DIAGNOSIS — M545 Low back pain: Secondary | ICD-10-CM | POA: Diagnosis not present

## 2016-05-11 DIAGNOSIS — E7800 Pure hypercholesterolemia, unspecified: Secondary | ICD-10-CM | POA: Diagnosis not present

## 2016-05-11 DIAGNOSIS — Z0000 Encounter for general adult medical examination without abnormal findings: Secondary | ICD-10-CM | POA: Diagnosis not present

## 2016-05-11 DIAGNOSIS — R0989 Other specified symptoms and signs involving the circulatory and respiratory systems: Secondary | ICD-10-CM | POA: Diagnosis not present

## 2016-05-13 ENCOUNTER — Other Ambulatory Visit: Payer: Self-pay | Admitting: Geriatric Medicine

## 2016-05-13 DIAGNOSIS — R0989 Other specified symptoms and signs involving the circulatory and respiratory systems: Secondary | ICD-10-CM

## 2016-05-15 DIAGNOSIS — N481 Balanitis: Secondary | ICD-10-CM | POA: Diagnosis not present

## 2016-05-15 DIAGNOSIS — N529 Male erectile dysfunction, unspecified: Secondary | ICD-10-CM | POA: Diagnosis not present

## 2016-05-15 DIAGNOSIS — N401 Enlarged prostate with lower urinary tract symptoms: Secondary | ICD-10-CM | POA: Diagnosis not present

## 2016-05-15 DIAGNOSIS — N4340 Spermatocele of epididymis, unspecified: Secondary | ICD-10-CM | POA: Diagnosis not present

## 2016-05-15 DIAGNOSIS — R3911 Hesitancy of micturition: Secondary | ICD-10-CM | POA: Diagnosis not present

## 2016-05-19 ENCOUNTER — Ambulatory Visit
Admission: RE | Admit: 2016-05-19 | Discharge: 2016-05-19 | Disposition: A | Discharge status: Home / Self Care | Payer: Medicare Other | Source: Ambulatory Visit | Attending: Geriatric Medicine | Admitting: Geriatric Medicine

## 2016-05-19 DIAGNOSIS — R0989 Other specified symptoms and signs involving the circulatory and respiratory systems: Secondary | ICD-10-CM

## 2016-05-19 DIAGNOSIS — I739 Peripheral vascular disease, unspecified: Secondary | ICD-10-CM | POA: Diagnosis not present

## 2016-06-03 DIAGNOSIS — L608 Other nail disorders: Secondary | ICD-10-CM | POA: Diagnosis not present

## 2016-06-16 ENCOUNTER — Ambulatory Visit (INDEPENDENT_AMBULATORY_CARE_PROVIDER_SITE_OTHER): Payer: Medicare Other | Admitting: Podiatry

## 2016-06-16 ENCOUNTER — Encounter: Payer: Self-pay | Admitting: Podiatry

## 2016-06-16 DIAGNOSIS — L02612 Cutaneous abscess of left foot: Secondary | ICD-10-CM

## 2016-06-16 DIAGNOSIS — L03032 Cellulitis of left toe: Secondary | ICD-10-CM | POA: Diagnosis not present

## 2016-06-16 MED ORDER — CEPHALEXIN 500 MG PO CAPS: 500.0000 mg | ORAL_CAPSULE | Freq: Two times a day (BID) | ORAL | Status: DC

## 2016-06-16 NOTE — Progress Notes (Signed)
He presents today with an injury to his neck and nail plate second digit of the left foot. He states that the toe has been red and swollen recently and he was sent over by his primary care provider.  Objective: Pulses remain palpable to the left foot. He has no identifiable injury to his second nail plate left the majority of the nail plate is gone and he has well-healing epithelialize nailbed. He has some cellulitis at the level of the DIPJ without extending proximally. Pulses remain palpable and capillary fill time is immediate.  Assessment: Mild cellulitis second digit left.  Plan: Started him on Keflex 500 mg 1 by mouth twice a day and he will soak once daily in Epsom salts and warm water. I will follow up at his regular scheduled nail debridement.

## 2016-07-08 ENCOUNTER — Telehealth: Payer: Self-pay | Admitting: *Deleted

## 2016-07-08 MED ORDER — SULFAMETHOXAZOLE-TRIMETHOPRIM 400-80 MG PO TABS: 1.0000 | ORAL_TABLET | Freq: Two times a day (BID) | ORAL | Status: DC

## 2016-07-08 NOTE — Telephone Encounter (Addendum)
Pt states Dr.Hyatt gave him an antibiotic and the toe is still red, even after completing the medication.  I offered pt an appt earlier than his 07/28/2016 to be evaluated by another doctor.  Pt states he's already seen Dr. Milinda Pointer and put out a lot of money and he wants an different antibiotic.  I told pt I would put in a request with the doctor on call, since Dr. Milinda Pointer was not in the office, but the on-call-doctor may want to evaluate him prior to changing the antibiotic.  Pt stated he didn't want to spend anymore insurance money and he wanted to know how much we charged of a visit.  I told pt I did not care about insurance concerns, I was concerned about his health and that I felt he would benefit from being reevaluated and transferred him to the schedulers. Dr. Cannon Kettle changed to Bactrim, orders called to pt and to pharmacy and told pt to keep the 07/14/2016 appt.

## 2016-07-08 NOTE — Telephone Encounter (Signed)
We can give him bactrim bid x 10 days. If no better then return to office or ER immediately. -Dr. Chauncey Cruel

## 2016-07-14 ENCOUNTER — Ambulatory Visit (INDEPENDENT_AMBULATORY_CARE_PROVIDER_SITE_OTHER): Payer: Medicare Other | Admitting: Podiatry

## 2016-07-14 ENCOUNTER — Encounter: Payer: Self-pay | Admitting: Podiatry

## 2016-07-14 DIAGNOSIS — B351 Tinea unguium: Secondary | ICD-10-CM

## 2016-07-14 DIAGNOSIS — M79676 Pain in unspecified toe(s): Secondary | ICD-10-CM

## 2016-07-14 NOTE — Progress Notes (Signed)
He presents today as a diabetic concerned about erythema to the second digit of the left foot. He is also concerned about his long painful nails.  Objective: Vital signs are stable he is alert and oriented 3. Pulses are palpable. No erythema edema saline as drainage or odor his toes appear to be doing very well with exception of thick yellow dystrophic onychomycotic nails.  Assessment: Diabetes mellitus without complications at this point. Pain in limb secondary to onychomycosis.  Plan: Debridement of toenails 1 through 5 bilateral. Follow-up with me in 2-3 months.

## 2016-07-21 ENCOUNTER — Telehealth: Payer: Self-pay | Admitting: *Deleted

## 2016-07-21 NOTE — Telephone Encounter (Signed)
Pt left name, DOB and phone number. I spoke with pt and he said the antibiotics he's been on and the 2 office visits haven't done his toe any good, it is still red.  I told pt if he didn't have any drainage to culture then the doctors would prescribe what was a routine course of antibiotics for a red toe, if they didn't help, then another course of treatment may be ordered with or without an antibiotic, but he needed to be seen.  Transferred pt to schedulers.

## 2016-07-22 ENCOUNTER — Encounter: Payer: Self-pay | Admitting: Podiatry

## 2016-07-22 ENCOUNTER — Ambulatory Visit (INDEPENDENT_AMBULATORY_CARE_PROVIDER_SITE_OTHER): Payer: Medicare Other | Admitting: Podiatry

## 2016-07-22 VITALS — BP 142/88 | HR 72 | Resp 12

## 2016-07-22 DIAGNOSIS — L02612 Cutaneous abscess of left foot: Secondary | ICD-10-CM

## 2016-07-22 DIAGNOSIS — L03032 Cellulitis of left toe: Secondary | ICD-10-CM

## 2016-07-22 NOTE — Progress Notes (Signed)
   Subjective:    Patient ID: Chad Fuller, male    DOB: 1926/07/03, 82 y.o.   MRN: [id number]  HPI Patient presents today concerned about a redness in the second left toe that he noticed in the last 1-2 nights, however today he says that has improved today and he does not notice the redness. Patient has a history of cellulitis and second left toe treated by Dr. Milinda Pointer with Keflex 500 mg twice a day on the visit of 06/16/2016. There is a telephone note dated 07/08/2016 stating that Dr. Cannon Kettle prescribed Bactrim twice a day 10 days with instructions to return to the office or ER if does not improve. Patient has completed all Bactrim without any complaint from his medication  Patient is a type II diabetic  Review of Systems  Skin: Positive for color change.       Objective:   Physical Exam  Orientated 3  Vascular: Calf edema or calf tenderness bilaterally Edema ankles bilaterally DP pulses 2/4 bilaterally PT pulses 1/4 bilaterally Capillary reflex immediate bilaterally  Neurological: Sensation to 10 g monofilament wire intact 0/5 bilaterally Vibratory sedation nonreactive bilaterally Ankle reflexes reactive bilaterally  Dermatological: No open skin lesions bilaterally The toenails 6 to extend her trimmed neatly with dystrophic changes The second left toe demonstrates no erythema, edema, warmth, drainage  Musculoskeletal: Pes planus bilaterally        Assessment & Plan:   Assessment: Diabetic peripheral neuropathy No clinical sign of infection/cellulitis second toe left foot  Plan: Today reviewed the results of examination with patient and informed there was no clinical sign of infection in the second toe on left foot. I told him to continue to observe the second toe and return if he had future concerns otherwise reschedule for nail debridement with Dr. Milinda Pointer at previously scheduled visit

## 2016-07-22 NOTE — Patient Instructions (Signed)
He requests an examination of the second left toe because of redness that you notice in the toe at night. He described completing 2 times antibiotics for the last one was in the last day or 2 The second left toe demonstrates no swelling, redness, warmth, drainage There is no clinical sign of infection the second toe on the left foot  Return as needed for any of your future concerns about this toe or at the next scheduled visit for nail debridement  Diabetes and Foot Care Diabetes may cause you to have problems because of poor blood supply (circulation) to your feet and legs. This may cause the skin on your feet to become thinner, break easier, and heal more slowly. Your skin may become dry, and the skin may peel and crack. You may also have nerve damage in your legs and feet causing decreased feeling in them. You may not notice minor injuries to your feet that could lead to infections or more serious problems. Taking care of your feet is one of the most important things you can do for yourself.  HOME CARE INSTRUCTIONS  Wear shoes at all times, even in the house. Do not go barefoot. Bare feet are easily injured.  Check your feet daily for blisters, cuts, and redness. If you cannot see the bottom of your feet, use a mirror or ask someone for help.  Wash your feet with warm water (do not use hot water) and mild soap. Then pat your feet and the areas between your toes until they are completely dry. Do not soak your feet as this can dry your skin.  Apply a moisturizing lotion or petroleum jelly (that does not contain alcohol and is unscented) to the skin on your feet and to dry, brittle toenails. Do not apply lotion between your toes.  Trim your toenails straight across. Do not dig under them or around the cuticle. File the edges of your nails with an emery board or nail file.  Do not cut corns or calluses or try to remove them with medicine.  Wear clean socks or stockings every day. Make sure they  are not too tight. Do not wear knee-high stockings since they may decrease blood flow to your legs.  Wear shoes that fit properly and have enough cushioning. To break in new shoes, wear them for just a few hours a day. This prevents you from injuring your feet. Always look in your shoes before you put them on to be sure there are no objects inside.  Do not cross your legs. This may decrease the blood flow to your feet.  If you find a minor scrape, cut, or break in the skin on your feet, keep it and the skin around it clean and dry. These areas may be cleansed with mild soap and water. Do not cleanse the area with peroxide, alcohol, or iodine.  When you remove an adhesive bandage, be sure not to damage the skin around it.  If you have a wound, look at it several times a day to make sure it is healing.  Do not use heating pads or hot water bottles. They may burn your skin. If you have lost feeling in your feet or legs, you may not know it is happening until it is too late.  Make sure your health care provider performs a complete foot exam at least annually or more often if you have foot problems. Report any cuts, sores, or bruises to your health care provider immediately.  SEEK MEDICAL CARE IF:   You have an injury that is not healing.  You have cuts or breaks in the skin.  You have an ingrown nail.  You notice redness on your legs or feet.  You feel burning or tingling in your legs or feet.  You have pain or cramps in your legs and feet.  Your legs or feet are numb.  Your feet always feel cold. SEEK IMMEDIATE MEDICAL CARE IF:   There is increasing redness, swelling, or pain in or around a wound.  There is a red line that goes up your leg.  Pus is coming from a wound.  You develop a fever or as directed by your health care provider.  You notice a bad smell coming from an ulcer or wound.   This information is not intended to replace advice given to you by your health care  provider. Make sure you discuss any questions you have with your health care provider.   Document Released: 12/11/2000 Document Revised: 08/16/2013 Document Reviewed: 05/23/2013 Elsevier Interactive Patient Education [employer].

## 2016-07-28 ENCOUNTER — Ambulatory Visit: Payer: Medicare Other | Admitting: Podiatry

## 2016-08-12 DIAGNOSIS — E119 Type 2 diabetes mellitus without complications: Secondary | ICD-10-CM | POA: Diagnosis not present

## 2016-09-09 DIAGNOSIS — I6930 Unspecified sequelae of cerebral infarction: Secondary | ICD-10-CM | POA: Diagnosis not present

## 2016-09-09 DIAGNOSIS — Z7984 Long term (current) use of oral hypoglycemic drugs: Secondary | ICD-10-CM | POA: Diagnosis not present

## 2016-09-09 DIAGNOSIS — Z79899 Other long term (current) drug therapy: Secondary | ICD-10-CM | POA: Diagnosis not present

## 2016-09-09 DIAGNOSIS — Z23 Encounter for immunization: Secondary | ICD-10-CM | POA: Diagnosis not present

## 2016-09-09 DIAGNOSIS — I10 Essential (primary) hypertension: Secondary | ICD-10-CM | POA: Diagnosis not present

## 2016-09-09 DIAGNOSIS — L723 Sebaceous cyst: Secondary | ICD-10-CM | POA: Diagnosis not present

## 2016-09-09 DIAGNOSIS — E1140 Type 2 diabetes mellitus with diabetic neuropathy, unspecified: Secondary | ICD-10-CM | POA: Diagnosis not present

## 2016-10-06 ENCOUNTER — Ambulatory Visit (INDEPENDENT_AMBULATORY_CARE_PROVIDER_SITE_OTHER): Payer: Medicare Other | Admitting: Podiatry

## 2016-10-06 DIAGNOSIS — M79676 Pain in unspecified toe(s): Secondary | ICD-10-CM

## 2016-10-06 DIAGNOSIS — B351 Tinea unguium: Secondary | ICD-10-CM | POA: Diagnosis not present

## 2016-10-07 NOTE — Progress Notes (Signed)
He presents today to the presents today with chief complaint painful elongated toenails.  Objective: Vital signs are stable he is alert and oriented 3 pulses are palpable. Toenails are thick yellow dystrophic with mycotic and painful palpation.  Assessment: Pain limb secondary to onychomycosis 1 through 5 bilateral.  Plan: Debridement of toenails 1 through 5 bilateral cover service secondary to pain.

## 2016-10-12 DIAGNOSIS — R3912 Poor urinary stream: Secondary | ICD-10-CM | POA: Diagnosis not present

## 2016-10-12 DIAGNOSIS — R42 Dizziness and giddiness: Secondary | ICD-10-CM | POA: Diagnosis not present

## 2016-10-12 DIAGNOSIS — N401 Enlarged prostate with lower urinary tract symptoms: Secondary | ICD-10-CM | POA: Diagnosis not present

## 2016-10-14 DIAGNOSIS — L821 Other seborrheic keratosis: Secondary | ICD-10-CM | POA: Diagnosis not present

## 2016-10-14 DIAGNOSIS — D225 Melanocytic nevi of trunk: Secondary | ICD-10-CM | POA: Diagnosis not present

## 2016-10-14 DIAGNOSIS — L820 Inflamed seborrheic keratosis: Secondary | ICD-10-CM | POA: Diagnosis not present

## 2016-10-14 DIAGNOSIS — D1801 Hemangioma of skin and subcutaneous tissue: Secondary | ICD-10-CM | POA: Diagnosis not present

## 2017-01-05 ENCOUNTER — Ambulatory Visit: Payer: Medicare Other | Admitting: Podiatry

## 2017-01-12 ENCOUNTER — Ambulatory Visit: Payer: Medicare Other | Admitting: Podiatry

## 2017-01-12 DIAGNOSIS — E1140 Type 2 diabetes mellitus with diabetic neuropathy, unspecified: Secondary | ICD-10-CM | POA: Diagnosis not present

## 2017-01-12 DIAGNOSIS — R42 Dizziness and giddiness: Secondary | ICD-10-CM | POA: Diagnosis not present

## 2017-01-12 DIAGNOSIS — E1165 Type 2 diabetes mellitus with hyperglycemia: Secondary | ICD-10-CM | POA: Diagnosis not present

## 2017-01-12 DIAGNOSIS — I10 Essential (primary) hypertension: Secondary | ICD-10-CM | POA: Diagnosis not present

## 2017-01-12 DIAGNOSIS — Z79899 Other long term (current) drug therapy: Secondary | ICD-10-CM | POA: Diagnosis not present

## 2017-01-12 DIAGNOSIS — Z7984 Long term (current) use of oral hypoglycemic drugs: Secondary | ICD-10-CM | POA: Diagnosis not present

## 2017-01-21 ENCOUNTER — Ambulatory Visit (INDEPENDENT_AMBULATORY_CARE_PROVIDER_SITE_OTHER): Payer: Medicare Other | Admitting: Podiatry

## 2017-01-21 ENCOUNTER — Encounter: Payer: Self-pay | Admitting: Podiatry

## 2017-01-21 DIAGNOSIS — M79676 Pain in unspecified toe(s): Secondary | ICD-10-CM | POA: Diagnosis not present

## 2017-01-21 DIAGNOSIS — B351 Tinea unguium: Secondary | ICD-10-CM | POA: Diagnosis not present

## 2017-01-21 NOTE — Progress Notes (Signed)
He presents today with chief complaint of painful elongated toenails.  Objective: No signs are stable he is alert and oriented 3. Pulses are palpable. No erythema edema cellulitis drainage or odor. His toenails are thick yellow dystrophic with mycotic painful palpation as well as debridement.  Assessment: Pain in limb secondary to onychomycosis.  Plan: Debridement of toenails 1 through 5 bilateral.

## 2017-01-25 DIAGNOSIS — L910 Hypertrophic scar: Secondary | ICD-10-CM | POA: Diagnosis not present

## 2017-02-08 DIAGNOSIS — I10 Essential (primary) hypertension: Secondary | ICD-10-CM | POA: Diagnosis not present

## 2017-02-08 DIAGNOSIS — E1140 Type 2 diabetes mellitus with diabetic neuropathy, unspecified: Secondary | ICD-10-CM | POA: Diagnosis not present

## 2017-02-08 DIAGNOSIS — R05 Cough: Secondary | ICD-10-CM | POA: Diagnosis not present

## 2017-02-08 DIAGNOSIS — Z7984 Long term (current) use of oral hypoglycemic drugs: Secondary | ICD-10-CM | POA: Diagnosis not present

## 2017-04-22 ENCOUNTER — Encounter: Payer: Self-pay | Admitting: Podiatry

## 2017-04-22 ENCOUNTER — Ambulatory Visit (INDEPENDENT_AMBULATORY_CARE_PROVIDER_SITE_OTHER): Payer: Medicare Other | Admitting: Podiatry

## 2017-04-22 DIAGNOSIS — B351 Tinea unguium: Secondary | ICD-10-CM | POA: Diagnosis not present

## 2017-04-22 DIAGNOSIS — M79676 Pain in unspecified toe(s): Secondary | ICD-10-CM | POA: Diagnosis not present

## 2017-04-22 NOTE — Progress Notes (Signed)
He presents today for chief complaint of painful elongated toenails.  Objective: Nails are long thick yellow dystrophic onychomycotic.  Assessment: Pain and limp secondary to onychomycosis.  Plan: Debridement of toenails 1 through 5 bilateral.

## 2017-06-08 DIAGNOSIS — E1140 Type 2 diabetes mellitus with diabetic neuropathy, unspecified: Secondary | ICD-10-CM | POA: Diagnosis not present

## 2017-06-08 DIAGNOSIS — Z794 Long term (current) use of insulin: Secondary | ICD-10-CM | POA: Diagnosis not present

## 2017-06-08 DIAGNOSIS — R42 Dizziness and giddiness: Secondary | ICD-10-CM | POA: Diagnosis not present

## 2017-06-08 DIAGNOSIS — E1165 Type 2 diabetes mellitus with hyperglycemia: Secondary | ICD-10-CM | POA: Diagnosis not present

## 2017-06-08 DIAGNOSIS — I10 Essential (primary) hypertension: Secondary | ICD-10-CM | POA: Diagnosis not present

## 2017-06-08 DIAGNOSIS — J449 Chronic obstructive pulmonary disease, unspecified: Secondary | ICD-10-CM | POA: Diagnosis not present

## 2017-06-08 DIAGNOSIS — Z7984 Long term (current) use of oral hypoglycemic drugs: Secondary | ICD-10-CM | POA: Diagnosis not present

## 2017-06-08 DIAGNOSIS — R269 Unspecified abnormalities of gait and mobility: Secondary | ICD-10-CM | POA: Diagnosis not present

## 2017-06-08 DIAGNOSIS — Z79899 Other long term (current) drug therapy: Secondary | ICD-10-CM | POA: Diagnosis not present

## 2017-06-15 DIAGNOSIS — I2510 Atherosclerotic heart disease of native coronary artery without angina pectoris: Secondary | ICD-10-CM | POA: Diagnosis not present

## 2017-06-15 DIAGNOSIS — E1140 Type 2 diabetes mellitus with diabetic neuropathy, unspecified: Secondary | ICD-10-CM | POA: Diagnosis not present

## 2017-06-15 DIAGNOSIS — E1165 Type 2 diabetes mellitus with hyperglycemia: Secondary | ICD-10-CM | POA: Diagnosis not present

## 2017-06-15 DIAGNOSIS — J449 Chronic obstructive pulmonary disease, unspecified: Secondary | ICD-10-CM | POA: Diagnosis not present

## 2017-06-15 DIAGNOSIS — I639 Cerebral infarction, unspecified: Secondary | ICD-10-CM | POA: Diagnosis not present

## 2017-06-15 DIAGNOSIS — I10 Essential (primary) hypertension: Secondary | ICD-10-CM | POA: Diagnosis not present

## 2017-06-15 DIAGNOSIS — Z794 Long term (current) use of insulin: Secondary | ICD-10-CM | POA: Diagnosis not present

## 2017-06-28 DIAGNOSIS — N4340 Spermatocele of epididymis, unspecified: Secondary | ICD-10-CM | POA: Diagnosis not present

## 2017-06-28 DIAGNOSIS — N401 Enlarged prostate with lower urinary tract symptoms: Secondary | ICD-10-CM | POA: Diagnosis not present

## 2017-06-28 DIAGNOSIS — N528 Other male erectile dysfunction: Secondary | ICD-10-CM | POA: Diagnosis not present

## 2017-06-28 DIAGNOSIS — N138 Other obstructive and reflux uropathy: Secondary | ICD-10-CM | POA: Diagnosis not present

## 2017-07-22 ENCOUNTER — Encounter: Payer: Self-pay | Admitting: Podiatry

## 2017-07-22 ENCOUNTER — Ambulatory Visit (INDEPENDENT_AMBULATORY_CARE_PROVIDER_SITE_OTHER): Payer: Medicare Other | Admitting: Podiatry

## 2017-07-22 DIAGNOSIS — M79676 Pain in unspecified toe(s): Secondary | ICD-10-CM | POA: Diagnosis not present

## 2017-07-22 DIAGNOSIS — B351 Tinea unguium: Secondary | ICD-10-CM | POA: Diagnosis not present

## 2017-07-23 NOTE — Progress Notes (Signed)
He presents today with a chief complaint of painful elongated toenails.  Objective: Vital signs are stable alert and oriented 3. Toenails are long thick yellow dystrophic with mycotic and painful on palpation.  Assessment: Pain limb secondary to onychomycosis.  Plan: Debridement of toenails 1 through 5 bilateral. Follow up with him in 3 months.

## 2017-10-02 DIAGNOSIS — Z23 Encounter for immunization: Secondary | ICD-10-CM | POA: Diagnosis not present

## 2017-10-21 ENCOUNTER — Ambulatory Visit: Payer: Medicare Other | Admitting: Podiatry

## 2017-11-11 DIAGNOSIS — Z7984 Long term (current) use of oral hypoglycemic drugs: Secondary | ICD-10-CM | POA: Diagnosis not present

## 2017-11-11 DIAGNOSIS — H524 Presbyopia: Secondary | ICD-10-CM | POA: Diagnosis not present

## 2017-11-11 DIAGNOSIS — Z794 Long term (current) use of insulin: Secondary | ICD-10-CM | POA: Diagnosis not present

## 2017-11-11 DIAGNOSIS — Z961 Presence of intraocular lens: Secondary | ICD-10-CM | POA: Diagnosis not present

## 2017-11-11 DIAGNOSIS — E119 Type 2 diabetes mellitus without complications: Secondary | ICD-10-CM | POA: Diagnosis not present

## 2017-11-11 DIAGNOSIS — H5203 Hypermetropia, bilateral: Secondary | ICD-10-CM | POA: Diagnosis not present

## 2017-11-11 DIAGNOSIS — H52223 Regular astigmatism, bilateral: Secondary | ICD-10-CM | POA: Diagnosis not present

## 2017-11-25 ENCOUNTER — Ambulatory Visit: Payer: Medicare Other | Admitting: Podiatry

## 2017-12-08 DIAGNOSIS — E1140 Type 2 diabetes mellitus with diabetic neuropathy, unspecified: Secondary | ICD-10-CM | POA: Diagnosis not present

## 2017-12-08 DIAGNOSIS — I10 Essential (primary) hypertension: Secondary | ICD-10-CM | POA: Diagnosis not present

## 2017-12-08 DIAGNOSIS — Z7984 Long term (current) use of oral hypoglycemic drugs: Secondary | ICD-10-CM | POA: Diagnosis not present

## 2017-12-08 DIAGNOSIS — Z794 Long term (current) use of insulin: Secondary | ICD-10-CM | POA: Diagnosis not present

## 2017-12-27 DIAGNOSIS — J0100 Acute maxillary sinusitis, unspecified: Secondary | ICD-10-CM | POA: Diagnosis not present

## 2018-01-03 ENCOUNTER — Ambulatory Visit (INDEPENDENT_AMBULATORY_CARE_PROVIDER_SITE_OTHER): Payer: Medicare Other | Admitting: Podiatry

## 2018-01-03 DIAGNOSIS — B351 Tinea unguium: Secondary | ICD-10-CM | POA: Diagnosis not present

## 2018-01-03 DIAGNOSIS — M79676 Pain in unspecified toe(s): Secondary | ICD-10-CM

## 2018-01-03 DIAGNOSIS — N4340 Spermatocele of epididymis, unspecified: Secondary | ICD-10-CM | POA: Diagnosis not present

## 2018-01-03 DIAGNOSIS — N529 Male erectile dysfunction, unspecified: Secondary | ICD-10-CM | POA: Diagnosis not present

## 2018-01-03 DIAGNOSIS — N401 Enlarged prostate with lower urinary tract symptoms: Secondary | ICD-10-CM | POA: Diagnosis not present

## 2018-01-03 DIAGNOSIS — N481 Balanitis: Secondary | ICD-10-CM | POA: Diagnosis not present

## 2018-01-03 DIAGNOSIS — N138 Other obstructive and reflux uropathy: Secondary | ICD-10-CM | POA: Diagnosis not present

## 2018-01-04 ENCOUNTER — Ambulatory Visit: Payer: Medicare Other | Admitting: Podiatry

## 2018-01-05 NOTE — Progress Notes (Signed)
   SUBJECTIVE Patient presents to office today complaining of elongated, thickened nails. Pain while ambulating in shoes. Patient is unable to trim their own nails.   Past Medical History:  Diagnosis Date  . CAD (coronary artery disease)   . Cancer ([place])    lung  . Diabetes mellitus   . History of blood clots   . Prostate enlargement   . Pulmonary embolus [facility]) july 2005    OBJECTIVE General Patient is awake, alert, and oriented x 3 and in no acute distress. Derm Skin is dry and supple bilateral. Negative open lesions or macerations. Remaining integument unremarkable. Nails are tender, long, thickened and dystrophic with subungual debris, consistent with onychomycosis, 1-5 bilateral. No signs of infection noted. Vasc  DP and PT pedal pulses palpable bilaterally. Temperature gradient within normal limits.  Neuro Epicritic and protective threshold sensation diminished bilaterally.  Musculoskeletal Exam No symptomatic pedal deformities noted bilateral. Muscular strength within normal limits.  ASSESSMENT 1. Onychodystrophic nails 1-5 bilateral with hyperkeratosis of nails.  2. Onychomycosis of nail due to dermatophyte bilateral 3. Pain in foot bilateral  PLAN OF CARE 1. Patient evaluated today.  2. Instructed to maintain good pedal hygiene and foot care.  3. Mechanical debridement of nails 1-5 bilaterally performed using a nail nipper. Filed with dremel without incident.  4. Return to clinic in 3 mos.    Edrick Kins, DPM [facility]  Dr. Edrick Kins, [address]                                        [place], [place] [address]                Office [phone]  Fax [phone]

## 2018-02-03 ENCOUNTER — Ambulatory Visit
Admission: RE | Admit: 2018-02-03 | Discharge: 2018-02-03 | Disposition: A | Discharge status: Home / Self Care | Payer: Medicare Other | Source: Ambulatory Visit | Attending: Internal Medicine | Admitting: Internal Medicine

## 2018-02-03 ENCOUNTER — Other Ambulatory Visit: Payer: Self-pay | Admitting: Internal Medicine

## 2018-02-03 DIAGNOSIS — R2681 Unsteadiness on feet: Secondary | ICD-10-CM | POA: Diagnosis not present

## 2018-02-03 DIAGNOSIS — R159 Full incontinence of feces: Secondary | ICD-10-CM | POA: Diagnosis not present

## 2018-02-03 DIAGNOSIS — R634 Abnormal weight loss: Secondary | ICD-10-CM

## 2018-02-03 DIAGNOSIS — R296 Repeated falls: Secondary | ICD-10-CM | POA: Diagnosis not present

## 2018-02-03 DIAGNOSIS — R32 Unspecified urinary incontinence: Secondary | ICD-10-CM | POA: Diagnosis not present

## 2018-02-03 DIAGNOSIS — E119 Type 2 diabetes mellitus without complications: Secondary | ICD-10-CM | POA: Diagnosis not present

## 2018-02-03 DIAGNOSIS — R531 Weakness: Secondary | ICD-10-CM | POA: Diagnosis not present

## 2018-02-03 DIAGNOSIS — Z7984 Long term (current) use of oral hypoglycemic drugs: Secondary | ICD-10-CM | POA: Diagnosis not present

## 2018-02-03 DIAGNOSIS — Z8673 Personal history of transient ischemic attack (TIA), and cerebral infarction without residual deficits: Secondary | ICD-10-CM | POA: Diagnosis not present

## 2018-02-15 DIAGNOSIS — E1142 Type 2 diabetes mellitus with diabetic polyneuropathy: Secondary | ICD-10-CM | POA: Diagnosis not present

## 2018-02-15 DIAGNOSIS — J449 Chronic obstructive pulmonary disease, unspecified: Secondary | ICD-10-CM | POA: Diagnosis not present

## 2018-02-15 DIAGNOSIS — M6281 Muscle weakness (generalized): Secondary | ICD-10-CM | POA: Diagnosis not present

## 2018-02-15 DIAGNOSIS — I2510 Atherosclerotic heart disease of native coronary artery without angina pectoris: Secondary | ICD-10-CM | POA: Diagnosis not present

## 2018-02-15 DIAGNOSIS — G510 Bell's palsy: Secondary | ICD-10-CM | POA: Diagnosis not present

## 2018-02-15 DIAGNOSIS — I10 Essential (primary) hypertension: Secondary | ICD-10-CM | POA: Diagnosis not present

## 2018-02-17 DIAGNOSIS — E1142 Type 2 diabetes mellitus with diabetic polyneuropathy: Secondary | ICD-10-CM | POA: Diagnosis not present

## 2018-02-17 DIAGNOSIS — I10 Essential (primary) hypertension: Secondary | ICD-10-CM | POA: Diagnosis not present

## 2018-02-17 DIAGNOSIS — E1165 Type 2 diabetes mellitus with hyperglycemia: Secondary | ICD-10-CM | POA: Diagnosis not present

## 2018-02-17 DIAGNOSIS — M6281 Muscle weakness (generalized): Secondary | ICD-10-CM | POA: Diagnosis not present

## 2018-02-17 DIAGNOSIS — J449 Chronic obstructive pulmonary disease, unspecified: Secondary | ICD-10-CM | POA: Diagnosis not present

## 2018-02-17 DIAGNOSIS — G510 Bell's palsy: Secondary | ICD-10-CM | POA: Diagnosis not present

## 2018-02-17 DIAGNOSIS — Z7984 Long term (current) use of oral hypoglycemic drugs: Secondary | ICD-10-CM | POA: Diagnosis not present

## 2018-02-17 DIAGNOSIS — Z794 Long term (current) use of insulin: Secondary | ICD-10-CM | POA: Diagnosis not present

## 2018-02-17 DIAGNOSIS — I2510 Atherosclerotic heart disease of native coronary artery without angina pectoris: Secondary | ICD-10-CM | POA: Diagnosis not present

## 2018-02-18 DIAGNOSIS — I2510 Atherosclerotic heart disease of native coronary artery without angina pectoris: Secondary | ICD-10-CM | POA: Diagnosis not present

## 2018-02-18 DIAGNOSIS — E1142 Type 2 diabetes mellitus with diabetic polyneuropathy: Secondary | ICD-10-CM | POA: Diagnosis not present

## 2018-02-18 DIAGNOSIS — I10 Essential (primary) hypertension: Secondary | ICD-10-CM | POA: Diagnosis not present

## 2018-02-18 DIAGNOSIS — J449 Chronic obstructive pulmonary disease, unspecified: Secondary | ICD-10-CM | POA: Diagnosis not present

## 2018-02-18 DIAGNOSIS — M6281 Muscle weakness (generalized): Secondary | ICD-10-CM | POA: Diagnosis not present

## 2018-02-18 DIAGNOSIS — G510 Bell's palsy: Secondary | ICD-10-CM | POA: Diagnosis not present

## 2018-02-21 DIAGNOSIS — E1140 Type 2 diabetes mellitus with diabetic neuropathy, unspecified: Secondary | ICD-10-CM | POA: Diagnosis not present

## 2018-02-21 DIAGNOSIS — E1165 Type 2 diabetes mellitus with hyperglycemia: Secondary | ICD-10-CM | POA: Diagnosis not present

## 2018-02-21 DIAGNOSIS — Z794 Long term (current) use of insulin: Secondary | ICD-10-CM | POA: Diagnosis not present

## 2018-02-21 DIAGNOSIS — I2510 Atherosclerotic heart disease of native coronary artery without angina pectoris: Secondary | ICD-10-CM | POA: Diagnosis not present

## 2018-02-21 DIAGNOSIS — I639 Cerebral infarction, unspecified: Secondary | ICD-10-CM | POA: Diagnosis not present

## 2018-02-21 DIAGNOSIS — J449 Chronic obstructive pulmonary disease, unspecified: Secondary | ICD-10-CM | POA: Diagnosis not present

## 2018-02-21 DIAGNOSIS — R269 Unspecified abnormalities of gait and mobility: Secondary | ICD-10-CM | POA: Diagnosis not present

## 2018-02-21 DIAGNOSIS — I10 Essential (primary) hypertension: Secondary | ICD-10-CM | POA: Diagnosis not present

## 2018-02-22 DIAGNOSIS — J449 Chronic obstructive pulmonary disease, unspecified: Secondary | ICD-10-CM | POA: Diagnosis not present

## 2018-02-22 DIAGNOSIS — M6281 Muscle weakness (generalized): Secondary | ICD-10-CM | POA: Diagnosis not present

## 2018-02-22 DIAGNOSIS — I10 Essential (primary) hypertension: Secondary | ICD-10-CM | POA: Diagnosis not present

## 2018-02-22 DIAGNOSIS — I2510 Atherosclerotic heart disease of native coronary artery without angina pectoris: Secondary | ICD-10-CM | POA: Diagnosis not present

## 2018-02-22 DIAGNOSIS — G510 Bell's palsy: Secondary | ICD-10-CM | POA: Diagnosis not present

## 2018-02-22 DIAGNOSIS — E1142 Type 2 diabetes mellitus with diabetic polyneuropathy: Secondary | ICD-10-CM | POA: Diagnosis not present

## 2018-02-23 DIAGNOSIS — E1142 Type 2 diabetes mellitus with diabetic polyneuropathy: Secondary | ICD-10-CM | POA: Diagnosis not present

## 2018-02-23 DIAGNOSIS — M6281 Muscle weakness (generalized): Secondary | ICD-10-CM | POA: Diagnosis not present

## 2018-02-23 DIAGNOSIS — I2510 Atherosclerotic heart disease of native coronary artery without angina pectoris: Secondary | ICD-10-CM | POA: Diagnosis not present

## 2018-02-23 DIAGNOSIS — G510 Bell's palsy: Secondary | ICD-10-CM | POA: Diagnosis not present

## 2018-02-23 DIAGNOSIS — I10 Essential (primary) hypertension: Secondary | ICD-10-CM | POA: Diagnosis not present

## 2018-02-23 DIAGNOSIS — J449 Chronic obstructive pulmonary disease, unspecified: Secondary | ICD-10-CM | POA: Diagnosis not present

## 2018-02-25 DIAGNOSIS — J449 Chronic obstructive pulmonary disease, unspecified: Secondary | ICD-10-CM | POA: Diagnosis not present

## 2018-02-25 DIAGNOSIS — E1142 Type 2 diabetes mellitus with diabetic polyneuropathy: Secondary | ICD-10-CM | POA: Diagnosis not present

## 2018-02-25 DIAGNOSIS — G510 Bell's palsy: Secondary | ICD-10-CM | POA: Diagnosis not present

## 2018-02-25 DIAGNOSIS — I10 Essential (primary) hypertension: Secondary | ICD-10-CM | POA: Diagnosis not present

## 2018-02-25 DIAGNOSIS — I2510 Atherosclerotic heart disease of native coronary artery without angina pectoris: Secondary | ICD-10-CM | POA: Diagnosis not present

## 2018-02-25 DIAGNOSIS — M6281 Muscle weakness (generalized): Secondary | ICD-10-CM | POA: Diagnosis not present

## 2018-03-02 DIAGNOSIS — J449 Chronic obstructive pulmonary disease, unspecified: Secondary | ICD-10-CM | POA: Diagnosis not present

## 2018-03-02 DIAGNOSIS — M6281 Muscle weakness (generalized): Secondary | ICD-10-CM | POA: Diagnosis not present

## 2018-03-02 DIAGNOSIS — E1142 Type 2 diabetes mellitus with diabetic polyneuropathy: Secondary | ICD-10-CM | POA: Diagnosis not present

## 2018-03-02 DIAGNOSIS — G510 Bell's palsy: Secondary | ICD-10-CM | POA: Diagnosis not present

## 2018-03-02 DIAGNOSIS — I2510 Atherosclerotic heart disease of native coronary artery without angina pectoris: Secondary | ICD-10-CM | POA: Diagnosis not present

## 2018-03-02 DIAGNOSIS — I10 Essential (primary) hypertension: Secondary | ICD-10-CM | POA: Diagnosis not present

## 2018-03-03 DIAGNOSIS — I10 Essential (primary) hypertension: Secondary | ICD-10-CM | POA: Diagnosis not present

## 2018-03-03 DIAGNOSIS — I2510 Atherosclerotic heart disease of native coronary artery without angina pectoris: Secondary | ICD-10-CM | POA: Diagnosis not present

## 2018-03-03 DIAGNOSIS — E1142 Type 2 diabetes mellitus with diabetic polyneuropathy: Secondary | ICD-10-CM | POA: Diagnosis not present

## 2018-03-03 DIAGNOSIS — M6281 Muscle weakness (generalized): Secondary | ICD-10-CM | POA: Diagnosis not present

## 2018-03-03 DIAGNOSIS — G510 Bell's palsy: Secondary | ICD-10-CM | POA: Diagnosis not present

## 2018-03-03 DIAGNOSIS — J449 Chronic obstructive pulmonary disease, unspecified: Secondary | ICD-10-CM | POA: Diagnosis not present

## 2018-03-04 DIAGNOSIS — I10 Essential (primary) hypertension: Secondary | ICD-10-CM | POA: Diagnosis not present

## 2018-03-04 DIAGNOSIS — I2510 Atherosclerotic heart disease of native coronary artery without angina pectoris: Secondary | ICD-10-CM | POA: Diagnosis not present

## 2018-03-04 DIAGNOSIS — M6281 Muscle weakness (generalized): Secondary | ICD-10-CM | POA: Diagnosis not present

## 2018-03-04 DIAGNOSIS — G510 Bell's palsy: Secondary | ICD-10-CM | POA: Diagnosis not present

## 2018-03-04 DIAGNOSIS — J449 Chronic obstructive pulmonary disease, unspecified: Secondary | ICD-10-CM | POA: Diagnosis not present

## 2018-03-04 DIAGNOSIS — E1142 Type 2 diabetes mellitus with diabetic polyneuropathy: Secondary | ICD-10-CM | POA: Diagnosis not present

## 2018-03-08 DIAGNOSIS — I2510 Atherosclerotic heart disease of native coronary artery without angina pectoris: Secondary | ICD-10-CM | POA: Diagnosis not present

## 2018-03-08 DIAGNOSIS — J449 Chronic obstructive pulmonary disease, unspecified: Secondary | ICD-10-CM | POA: Diagnosis not present

## 2018-03-08 DIAGNOSIS — I10 Essential (primary) hypertension: Secondary | ICD-10-CM | POA: Diagnosis not present

## 2018-03-08 DIAGNOSIS — G510 Bell's palsy: Secondary | ICD-10-CM | POA: Diagnosis not present

## 2018-03-08 DIAGNOSIS — E1142 Type 2 diabetes mellitus with diabetic polyneuropathy: Secondary | ICD-10-CM | POA: Diagnosis not present

## 2018-03-08 DIAGNOSIS — M6281 Muscle weakness (generalized): Secondary | ICD-10-CM | POA: Diagnosis not present

## 2018-03-10 DIAGNOSIS — I10 Essential (primary) hypertension: Secondary | ICD-10-CM | POA: Diagnosis not present

## 2018-03-10 DIAGNOSIS — J449 Chronic obstructive pulmonary disease, unspecified: Secondary | ICD-10-CM | POA: Diagnosis not present

## 2018-03-10 DIAGNOSIS — I2510 Atherosclerotic heart disease of native coronary artery without angina pectoris: Secondary | ICD-10-CM | POA: Diagnosis not present

## 2018-03-10 DIAGNOSIS — E1142 Type 2 diabetes mellitus with diabetic polyneuropathy: Secondary | ICD-10-CM | POA: Diagnosis not present

## 2018-03-10 DIAGNOSIS — M6281 Muscle weakness (generalized): Secondary | ICD-10-CM | POA: Diagnosis not present

## 2018-03-10 DIAGNOSIS — G510 Bell's palsy: Secondary | ICD-10-CM | POA: Diagnosis not present

## 2018-03-11 DIAGNOSIS — M6281 Muscle weakness (generalized): Secondary | ICD-10-CM | POA: Diagnosis not present

## 2018-03-11 DIAGNOSIS — I10 Essential (primary) hypertension: Secondary | ICD-10-CM | POA: Diagnosis not present

## 2018-03-11 DIAGNOSIS — J449 Chronic obstructive pulmonary disease, unspecified: Secondary | ICD-10-CM | POA: Diagnosis not present

## 2018-03-11 DIAGNOSIS — E1142 Type 2 diabetes mellitus with diabetic polyneuropathy: Secondary | ICD-10-CM | POA: Diagnosis not present

## 2018-03-11 DIAGNOSIS — I2510 Atherosclerotic heart disease of native coronary artery without angina pectoris: Secondary | ICD-10-CM | POA: Diagnosis not present

## 2018-03-11 DIAGNOSIS — G510 Bell's palsy: Secondary | ICD-10-CM | POA: Diagnosis not present

## 2018-03-14 DIAGNOSIS — I2510 Atherosclerotic heart disease of native coronary artery without angina pectoris: Secondary | ICD-10-CM | POA: Diagnosis not present

## 2018-03-14 DIAGNOSIS — J449 Chronic obstructive pulmonary disease, unspecified: Secondary | ICD-10-CM | POA: Diagnosis not present

## 2018-03-14 DIAGNOSIS — I10 Essential (primary) hypertension: Secondary | ICD-10-CM | POA: Diagnosis not present

## 2018-03-14 DIAGNOSIS — G510 Bell's palsy: Secondary | ICD-10-CM | POA: Diagnosis not present

## 2018-03-14 DIAGNOSIS — E1142 Type 2 diabetes mellitus with diabetic polyneuropathy: Secondary | ICD-10-CM | POA: Diagnosis not present

## 2018-03-14 DIAGNOSIS — M6281 Muscle weakness (generalized): Secondary | ICD-10-CM | POA: Diagnosis not present

## 2018-03-16 DIAGNOSIS — M6281 Muscle weakness (generalized): Secondary | ICD-10-CM | POA: Diagnosis not present

## 2018-03-16 DIAGNOSIS — G510 Bell's palsy: Secondary | ICD-10-CM | POA: Diagnosis not present

## 2018-03-16 DIAGNOSIS — J449 Chronic obstructive pulmonary disease, unspecified: Secondary | ICD-10-CM | POA: Diagnosis not present

## 2018-03-16 DIAGNOSIS — I2510 Atherosclerotic heart disease of native coronary artery without angina pectoris: Secondary | ICD-10-CM | POA: Diagnosis not present

## 2018-03-16 DIAGNOSIS — E1142 Type 2 diabetes mellitus with diabetic polyneuropathy: Secondary | ICD-10-CM | POA: Diagnosis not present

## 2018-03-16 DIAGNOSIS — I10 Essential (primary) hypertension: Secondary | ICD-10-CM | POA: Diagnosis not present

## 2018-03-21 DIAGNOSIS — J449 Chronic obstructive pulmonary disease, unspecified: Secondary | ICD-10-CM | POA: Diagnosis not present

## 2018-03-21 DIAGNOSIS — G510 Bell's palsy: Secondary | ICD-10-CM | POA: Diagnosis not present

## 2018-03-21 DIAGNOSIS — I10 Essential (primary) hypertension: Secondary | ICD-10-CM | POA: Diagnosis not present

## 2018-03-21 DIAGNOSIS — E1142 Type 2 diabetes mellitus with diabetic polyneuropathy: Secondary | ICD-10-CM | POA: Diagnosis not present

## 2018-03-21 DIAGNOSIS — I2510 Atherosclerotic heart disease of native coronary artery without angina pectoris: Secondary | ICD-10-CM | POA: Diagnosis not present

## 2018-03-21 DIAGNOSIS — M6281 Muscle weakness (generalized): Secondary | ICD-10-CM | POA: Diagnosis not present

## 2018-03-23 DIAGNOSIS — M6281 Muscle weakness (generalized): Secondary | ICD-10-CM | POA: Diagnosis not present

## 2018-03-23 DIAGNOSIS — I2510 Atherosclerotic heart disease of native coronary artery without angina pectoris: Secondary | ICD-10-CM | POA: Diagnosis not present

## 2018-03-23 DIAGNOSIS — G510 Bell's palsy: Secondary | ICD-10-CM | POA: Diagnosis not present

## 2018-03-23 DIAGNOSIS — I10 Essential (primary) hypertension: Secondary | ICD-10-CM | POA: Diagnosis not present

## 2018-03-23 DIAGNOSIS — J449 Chronic obstructive pulmonary disease, unspecified: Secondary | ICD-10-CM | POA: Diagnosis not present

## 2018-03-23 DIAGNOSIS — E1142 Type 2 diabetes mellitus with diabetic polyneuropathy: Secondary | ICD-10-CM | POA: Diagnosis not present

## 2018-03-28 DIAGNOSIS — M6281 Muscle weakness (generalized): Secondary | ICD-10-CM | POA: Diagnosis not present

## 2018-03-28 DIAGNOSIS — I10 Essential (primary) hypertension: Secondary | ICD-10-CM | POA: Diagnosis not present

## 2018-03-28 DIAGNOSIS — J449 Chronic obstructive pulmonary disease, unspecified: Secondary | ICD-10-CM | POA: Diagnosis not present

## 2018-03-28 DIAGNOSIS — E1142 Type 2 diabetes mellitus with diabetic polyneuropathy: Secondary | ICD-10-CM | POA: Diagnosis not present

## 2018-03-28 DIAGNOSIS — I2510 Atherosclerotic heart disease of native coronary artery without angina pectoris: Secondary | ICD-10-CM | POA: Diagnosis not present

## 2018-03-28 DIAGNOSIS — G510 Bell's palsy: Secondary | ICD-10-CM | POA: Diagnosis not present

## 2018-03-30 DIAGNOSIS — G510 Bell's palsy: Secondary | ICD-10-CM | POA: Diagnosis not present

## 2018-03-30 DIAGNOSIS — J449 Chronic obstructive pulmonary disease, unspecified: Secondary | ICD-10-CM | POA: Diagnosis not present

## 2018-03-30 DIAGNOSIS — E1142 Type 2 diabetes mellitus with diabetic polyneuropathy: Secondary | ICD-10-CM | POA: Diagnosis not present

## 2018-03-30 DIAGNOSIS — M6281 Muscle weakness (generalized): Secondary | ICD-10-CM | POA: Diagnosis not present

## 2018-03-30 DIAGNOSIS — I2510 Atherosclerotic heart disease of native coronary artery without angina pectoris: Secondary | ICD-10-CM | POA: Diagnosis not present

## 2018-03-30 DIAGNOSIS — I10 Essential (primary) hypertension: Secondary | ICD-10-CM | POA: Diagnosis not present

## 2018-04-04 DIAGNOSIS — I2510 Atherosclerotic heart disease of native coronary artery without angina pectoris: Secondary | ICD-10-CM | POA: Diagnosis not present

## 2018-04-04 DIAGNOSIS — G510 Bell's palsy: Secondary | ICD-10-CM | POA: Diagnosis not present

## 2018-04-04 DIAGNOSIS — I10 Essential (primary) hypertension: Secondary | ICD-10-CM | POA: Diagnosis not present

## 2018-04-04 DIAGNOSIS — E1142 Type 2 diabetes mellitus with diabetic polyneuropathy: Secondary | ICD-10-CM | POA: Diagnosis not present

## 2018-04-04 DIAGNOSIS — M6281 Muscle weakness (generalized): Secondary | ICD-10-CM | POA: Diagnosis not present

## 2018-04-04 DIAGNOSIS — J449 Chronic obstructive pulmonary disease, unspecified: Secondary | ICD-10-CM | POA: Diagnosis not present

## 2018-04-05 ENCOUNTER — Encounter: Payer: Self-pay | Admitting: Podiatry

## 2018-04-05 ENCOUNTER — Ambulatory Visit (INDEPENDENT_AMBULATORY_CARE_PROVIDER_SITE_OTHER): Payer: Medicare Other | Admitting: Podiatry

## 2018-04-05 DIAGNOSIS — M79676 Pain in unspecified toe(s): Secondary | ICD-10-CM

## 2018-04-05 DIAGNOSIS — B351 Tinea unguium: Secondary | ICD-10-CM | POA: Diagnosis not present

## 2018-04-06 DIAGNOSIS — I10 Essential (primary) hypertension: Secondary | ICD-10-CM | POA: Diagnosis not present

## 2018-04-06 DIAGNOSIS — G510 Bell's palsy: Secondary | ICD-10-CM | POA: Diagnosis not present

## 2018-04-06 DIAGNOSIS — M6281 Muscle weakness (generalized): Secondary | ICD-10-CM | POA: Diagnosis not present

## 2018-04-06 DIAGNOSIS — J449 Chronic obstructive pulmonary disease, unspecified: Secondary | ICD-10-CM | POA: Diagnosis not present

## 2018-04-06 DIAGNOSIS — I2510 Atherosclerotic heart disease of native coronary artery without angina pectoris: Secondary | ICD-10-CM | POA: Diagnosis not present

## 2018-04-06 DIAGNOSIS — E1142 Type 2 diabetes mellitus with diabetic polyneuropathy: Secondary | ICD-10-CM | POA: Diagnosis not present

## 2018-04-06 NOTE — Progress Notes (Signed)
He presents today chief complaint of painful elongated toenails bilaterally.  Objective: Toenails are long thick yellow dystrophic with mycotic painful palpation as well as debridement.  Assessment: Pain in limb secondary to onychomycosis.  Plan: Debridement of toenails 1 through 5 bilateral.

## 2018-04-11 DIAGNOSIS — I2510 Atherosclerotic heart disease of native coronary artery without angina pectoris: Secondary | ICD-10-CM | POA: Diagnosis not present

## 2018-04-11 DIAGNOSIS — I10 Essential (primary) hypertension: Secondary | ICD-10-CM | POA: Diagnosis not present

## 2018-04-11 DIAGNOSIS — J449 Chronic obstructive pulmonary disease, unspecified: Secondary | ICD-10-CM | POA: Diagnosis not present

## 2018-04-11 DIAGNOSIS — M6281 Muscle weakness (generalized): Secondary | ICD-10-CM | POA: Diagnosis not present

## 2018-04-11 DIAGNOSIS — E1142 Type 2 diabetes mellitus with diabetic polyneuropathy: Secondary | ICD-10-CM | POA: Diagnosis not present

## 2018-04-11 DIAGNOSIS — G510 Bell's palsy: Secondary | ICD-10-CM | POA: Diagnosis not present

## 2018-04-14 DIAGNOSIS — E1142 Type 2 diabetes mellitus with diabetic polyneuropathy: Secondary | ICD-10-CM | POA: Diagnosis not present

## 2018-04-14 DIAGNOSIS — I2510 Atherosclerotic heart disease of native coronary artery without angina pectoris: Secondary | ICD-10-CM | POA: Diagnosis not present

## 2018-04-14 DIAGNOSIS — G510 Bell's palsy: Secondary | ICD-10-CM | POA: Diagnosis not present

## 2018-04-14 DIAGNOSIS — M6281 Muscle weakness (generalized): Secondary | ICD-10-CM | POA: Diagnosis not present

## 2018-04-14 DIAGNOSIS — J449 Chronic obstructive pulmonary disease, unspecified: Secondary | ICD-10-CM | POA: Diagnosis not present

## 2018-04-14 DIAGNOSIS — I10 Essential (primary) hypertension: Secondary | ICD-10-CM | POA: Diagnosis not present

## 2018-05-17 DIAGNOSIS — I10 Essential (primary) hypertension: Secondary | ICD-10-CM | POA: Diagnosis not present

## 2018-05-17 DIAGNOSIS — L299 Pruritus, unspecified: Secondary | ICD-10-CM | POA: Diagnosis not present

## 2018-05-17 DIAGNOSIS — Z7984 Long term (current) use of oral hypoglycemic drugs: Secondary | ICD-10-CM | POA: Diagnosis not present

## 2018-05-17 DIAGNOSIS — G720 Drug-induced myopathy: Secondary | ICD-10-CM | POA: Diagnosis not present

## 2018-05-17 DIAGNOSIS — R600 Localized edema: Secondary | ICD-10-CM | POA: Diagnosis not present

## 2018-05-17 DIAGNOSIS — E1140 Type 2 diabetes mellitus with diabetic neuropathy, unspecified: Secondary | ICD-10-CM | POA: Diagnosis not present

## 2018-05-28 DIAGNOSIS — I350 Nonrheumatic aortic (valve) stenosis: Secondary | ICD-10-CM

## 2018-05-28 HISTORY — DX: Nonrheumatic aortic (valve) stenosis: I35.0

## 2018-05-31 DIAGNOSIS — L218 Other seborrheic dermatitis: Secondary | ICD-10-CM | POA: Diagnosis not present

## 2018-05-31 DIAGNOSIS — D1801 Hemangioma of skin and subcutaneous tissue: Secondary | ICD-10-CM | POA: Diagnosis not present

## 2018-05-31 DIAGNOSIS — L720 Epidermal cyst: Secondary | ICD-10-CM | POA: Diagnosis not present

## 2018-05-31 DIAGNOSIS — L814 Other melanin hyperpigmentation: Secondary | ICD-10-CM | POA: Diagnosis not present

## 2018-05-31 DIAGNOSIS — D225 Melanocytic nevi of trunk: Secondary | ICD-10-CM | POA: Diagnosis not present

## 2018-05-31 DIAGNOSIS — L905 Scar conditions and fibrosis of skin: Secondary | ICD-10-CM | POA: Diagnosis not present

## 2018-05-31 DIAGNOSIS — L821 Other seborrheic keratosis: Secondary | ICD-10-CM | POA: Diagnosis not present

## 2018-06-02 DIAGNOSIS — Z7984 Long term (current) use of oral hypoglycemic drugs: Secondary | ICD-10-CM | POA: Diagnosis not present

## 2018-06-02 DIAGNOSIS — I639 Cerebral infarction, unspecified: Secondary | ICD-10-CM | POA: Diagnosis not present

## 2018-06-02 DIAGNOSIS — E119 Type 2 diabetes mellitus without complications: Secondary | ICD-10-CM | POA: Diagnosis not present

## 2018-06-02 DIAGNOSIS — E1165 Type 2 diabetes mellitus with hyperglycemia: Secondary | ICD-10-CM | POA: Diagnosis not present

## 2018-06-02 DIAGNOSIS — J449 Chronic obstructive pulmonary disease, unspecified: Secondary | ICD-10-CM | POA: Diagnosis not present

## 2018-06-02 DIAGNOSIS — I2510 Atherosclerotic heart disease of native coronary artery without angina pectoris: Secondary | ICD-10-CM | POA: Diagnosis not present

## 2018-06-02 DIAGNOSIS — I10 Essential (primary) hypertension: Secondary | ICD-10-CM | POA: Diagnosis not present

## 2018-06-02 DIAGNOSIS — E1140 Type 2 diabetes mellitus with diabetic neuropathy, unspecified: Secondary | ICD-10-CM | POA: Diagnosis not present

## 2018-06-15 DIAGNOSIS — E1165 Type 2 diabetes mellitus with hyperglycemia: Secondary | ICD-10-CM | POA: Diagnosis not present

## 2018-06-15 DIAGNOSIS — R600 Localized edema: Secondary | ICD-10-CM | POA: Diagnosis not present

## 2018-06-15 DIAGNOSIS — E1140 Type 2 diabetes mellitus with diabetic neuropathy, unspecified: Secondary | ICD-10-CM | POA: Diagnosis not present

## 2018-06-15 DIAGNOSIS — R011 Cardiac murmur, unspecified: Secondary | ICD-10-CM | POA: Diagnosis not present

## 2018-06-15 DIAGNOSIS — I10 Essential (primary) hypertension: Secondary | ICD-10-CM | POA: Diagnosis not present

## 2018-06-16 ENCOUNTER — Other Ambulatory Visit (HOSPITAL_COMMUNITY): Payer: Self-pay | Admitting: Geriatric Medicine

## 2018-06-16 DIAGNOSIS — R011 Cardiac murmur, unspecified: Secondary | ICD-10-CM

## 2018-06-17 ENCOUNTER — Ambulatory Visit (HOSPITAL_COMMUNITY): Payer: Medicare Other | Attending: Cardiovascular Disease

## 2018-06-17 ENCOUNTER — Encounter (INDEPENDENT_AMBULATORY_CARE_PROVIDER_SITE_OTHER): Payer: Self-pay

## 2018-06-17 ENCOUNTER — Other Ambulatory Visit: Payer: Self-pay

## 2018-06-17 DIAGNOSIS — I2510 Atherosclerotic heart disease of native coronary artery without angina pectoris: Secondary | ICD-10-CM | POA: Diagnosis not present

## 2018-06-17 DIAGNOSIS — Z86711 Personal history of pulmonary embolism: Secondary | ICD-10-CM | POA: Insufficient documentation

## 2018-06-17 DIAGNOSIS — Z87891 Personal history of nicotine dependence: Secondary | ICD-10-CM | POA: Diagnosis not present

## 2018-06-17 DIAGNOSIS — R011 Cardiac murmur, unspecified: Secondary | ICD-10-CM | POA: Diagnosis not present

## 2018-06-17 DIAGNOSIS — I350 Nonrheumatic aortic (valve) stenosis: Secondary | ICD-10-CM | POA: Insufficient documentation

## 2018-06-17 DIAGNOSIS — Z85118 Personal history of other malignant neoplasm of bronchus and lung: Secondary | ICD-10-CM | POA: Insufficient documentation

## 2018-06-17 DIAGNOSIS — I050 Rheumatic mitral stenosis: Secondary | ICD-10-CM | POA: Insufficient documentation

## 2018-06-17 DIAGNOSIS — E119 Type 2 diabetes mellitus without complications: Secondary | ICD-10-CM | POA: Diagnosis not present

## 2018-07-08 DIAGNOSIS — N401 Enlarged prostate with lower urinary tract symptoms: Secondary | ICD-10-CM | POA: Diagnosis not present

## 2018-07-08 DIAGNOSIS — N138 Other obstructive and reflux uropathy: Secondary | ICD-10-CM | POA: Diagnosis not present

## 2018-07-08 DIAGNOSIS — N528 Other male erectile dysfunction: Secondary | ICD-10-CM | POA: Diagnosis not present

## 2018-07-08 DIAGNOSIS — N4340 Spermatocele of epididymis, unspecified: Secondary | ICD-10-CM | POA: Diagnosis not present

## 2018-07-08 DIAGNOSIS — N481 Balanitis: Secondary | ICD-10-CM | POA: Diagnosis not present

## 2018-07-12 ENCOUNTER — Ambulatory Visit: Payer: Medicare Other | Admitting: Podiatry

## 2018-07-14 ENCOUNTER — Encounter: Payer: Self-pay | Admitting: Podiatry

## 2018-07-14 ENCOUNTER — Ambulatory Visit (INDEPENDENT_AMBULATORY_CARE_PROVIDER_SITE_OTHER): Payer: Medicare Other | Admitting: Podiatry

## 2018-07-14 DIAGNOSIS — M79676 Pain in unspecified toe(s): Secondary | ICD-10-CM | POA: Diagnosis not present

## 2018-07-14 DIAGNOSIS — B351 Tinea unguium: Secondary | ICD-10-CM | POA: Diagnosis not present

## 2018-07-14 NOTE — Progress Notes (Signed)
He presents today chief complaint of painful elongated toenails 1 through 5 bilateral.  Objective: Vital signs are stable he is alert oriented x3 edema to bilateral lower extremity.  Toenails are long thick yellow dystrophic-like mycotic painful palpation.  Assessment: Pain in limb secondary to onychomycosis.  Plan: Discussed etiology pathology conservative surgical therapies debrided toenails 1 through 5 bilateral

## 2018-07-26 DIAGNOSIS — Z794 Long term (current) use of insulin: Secondary | ICD-10-CM | POA: Diagnosis not present

## 2018-07-26 DIAGNOSIS — J449 Chronic obstructive pulmonary disease, unspecified: Secondary | ICD-10-CM | POA: Diagnosis not present

## 2018-07-26 DIAGNOSIS — M79672 Pain in left foot: Secondary | ICD-10-CM | POA: Diagnosis not present

## 2018-07-26 DIAGNOSIS — I10 Essential (primary) hypertension: Secondary | ICD-10-CM | POA: Diagnosis not present

## 2018-07-26 DIAGNOSIS — I639 Cerebral infarction, unspecified: Secondary | ICD-10-CM | POA: Diagnosis not present

## 2018-07-26 DIAGNOSIS — I2510 Atherosclerotic heart disease of native coronary artery without angina pectoris: Secondary | ICD-10-CM | POA: Diagnosis not present

## 2018-07-26 DIAGNOSIS — E119 Type 2 diabetes mellitus without complications: Secondary | ICD-10-CM | POA: Diagnosis not present

## 2018-07-26 DIAGNOSIS — E1140 Type 2 diabetes mellitus with diabetic neuropathy, unspecified: Secondary | ICD-10-CM | POA: Diagnosis not present

## 2018-08-11 DIAGNOSIS — E1165 Type 2 diabetes mellitus with hyperglycemia: Secondary | ICD-10-CM | POA: Diagnosis not present

## 2018-08-11 DIAGNOSIS — Z794 Long term (current) use of insulin: Secondary | ICD-10-CM | POA: Diagnosis not present

## 2018-08-11 DIAGNOSIS — I2510 Atherosclerotic heart disease of native coronary artery without angina pectoris: Secondary | ICD-10-CM | POA: Diagnosis not present

## 2018-08-11 DIAGNOSIS — J449 Chronic obstructive pulmonary disease, unspecified: Secondary | ICD-10-CM | POA: Diagnosis not present

## 2018-08-11 DIAGNOSIS — I10 Essential (primary) hypertension: Secondary | ICD-10-CM | POA: Diagnosis not present

## 2018-08-11 DIAGNOSIS — E119 Type 2 diabetes mellitus without complications: Secondary | ICD-10-CM | POA: Diagnosis not present

## 2018-08-11 DIAGNOSIS — E1140 Type 2 diabetes mellitus with diabetic neuropathy, unspecified: Secondary | ICD-10-CM | POA: Diagnosis not present

## 2018-08-11 DIAGNOSIS — I639 Cerebral infarction, unspecified: Secondary | ICD-10-CM | POA: Diagnosis not present

## 2018-08-15 DIAGNOSIS — I10 Essential (primary) hypertension: Secondary | ICD-10-CM | POA: Diagnosis not present

## 2018-08-15 DIAGNOSIS — I6930 Unspecified sequelae of cerebral infarction: Secondary | ICD-10-CM | POA: Diagnosis not present

## 2018-08-15 DIAGNOSIS — I872 Venous insufficiency (chronic) (peripheral): Secondary | ICD-10-CM | POA: Diagnosis not present

## 2018-08-15 DIAGNOSIS — R269 Unspecified abnormalities of gait and mobility: Secondary | ICD-10-CM | POA: Diagnosis not present

## 2018-08-15 DIAGNOSIS — E1140 Type 2 diabetes mellitus with diabetic neuropathy, unspecified: Secondary | ICD-10-CM | POA: Diagnosis not present

## 2018-08-15 DIAGNOSIS — E1165 Type 2 diabetes mellitus with hyperglycemia: Secondary | ICD-10-CM | POA: Diagnosis not present

## 2018-09-09 DIAGNOSIS — I639 Cerebral infarction, unspecified: Secondary | ICD-10-CM | POA: Diagnosis not present

## 2018-09-09 DIAGNOSIS — E1140 Type 2 diabetes mellitus with diabetic neuropathy, unspecified: Secondary | ICD-10-CM | POA: Diagnosis not present

## 2018-09-09 DIAGNOSIS — Z7984 Long term (current) use of oral hypoglycemic drugs: Secondary | ICD-10-CM | POA: Diagnosis not present

## 2018-09-09 DIAGNOSIS — I10 Essential (primary) hypertension: Secondary | ICD-10-CM | POA: Diagnosis not present

## 2018-09-09 DIAGNOSIS — J449 Chronic obstructive pulmonary disease, unspecified: Secondary | ICD-10-CM | POA: Diagnosis not present

## 2018-09-09 DIAGNOSIS — E1165 Type 2 diabetes mellitus with hyperglycemia: Secondary | ICD-10-CM | POA: Diagnosis not present

## 2018-09-09 DIAGNOSIS — I2510 Atherosclerotic heart disease of native coronary artery without angina pectoris: Secondary | ICD-10-CM | POA: Diagnosis not present

## 2018-10-13 ENCOUNTER — Encounter: Payer: Self-pay | Admitting: Podiatry

## 2018-10-13 ENCOUNTER — Ambulatory Visit (INDEPENDENT_AMBULATORY_CARE_PROVIDER_SITE_OTHER): Payer: Medicare Other | Admitting: Podiatry

## 2018-10-13 DIAGNOSIS — B351 Tinea unguium: Secondary | ICD-10-CM

## 2018-10-13 DIAGNOSIS — M79676 Pain in unspecified toe(s): Secondary | ICD-10-CM

## 2018-10-13 NOTE — Progress Notes (Signed)
He presents today chief complaint of painful elongated toenails 1 through 5 bilateral.  Objective: Toenails are long thick yellow dystrophic-like mycotic painful palpation.  Assessment: Pain in limb secondary onychomycosis.  Plan: Debridement of toenails 1 through 5 bilateral.

## 2018-11-23 DIAGNOSIS — I10 Essential (primary) hypertension: Secondary | ICD-10-CM | POA: Diagnosis not present

## 2018-11-23 DIAGNOSIS — I2510 Atherosclerotic heart disease of native coronary artery without angina pectoris: Secondary | ICD-10-CM | POA: Diagnosis not present

## 2018-11-23 DIAGNOSIS — I639 Cerebral infarction, unspecified: Secondary | ICD-10-CM | POA: Diagnosis not present

## 2018-11-23 DIAGNOSIS — E119 Type 2 diabetes mellitus without complications: Secondary | ICD-10-CM | POA: Diagnosis not present

## 2018-11-23 DIAGNOSIS — J449 Chronic obstructive pulmonary disease, unspecified: Secondary | ICD-10-CM | POA: Diagnosis not present

## 2018-11-23 DIAGNOSIS — E1140 Type 2 diabetes mellitus with diabetic neuropathy, unspecified: Secondary | ICD-10-CM | POA: Diagnosis not present

## 2018-11-23 DIAGNOSIS — E1165 Type 2 diabetes mellitus with hyperglycemia: Secondary | ICD-10-CM | POA: Diagnosis not present

## 2018-12-08 DIAGNOSIS — K909 Intestinal malabsorption, unspecified: Secondary | ICD-10-CM | POA: Diagnosis not present

## 2018-12-08 DIAGNOSIS — E7800 Pure hypercholesterolemia, unspecified: Secondary | ICD-10-CM | POA: Diagnosis not present

## 2018-12-08 DIAGNOSIS — Z79899 Other long term (current) drug therapy: Secondary | ICD-10-CM | POA: Diagnosis not present

## 2018-12-08 DIAGNOSIS — R600 Localized edema: Secondary | ICD-10-CM | POA: Diagnosis not present

## 2018-12-08 DIAGNOSIS — E1140 Type 2 diabetes mellitus with diabetic neuropathy, unspecified: Secondary | ICD-10-CM | POA: Diagnosis not present

## 2018-12-08 DIAGNOSIS — I350 Nonrheumatic aortic (valve) stenosis: Secondary | ICD-10-CM | POA: Diagnosis not present

## 2018-12-08 DIAGNOSIS — K219 Gastro-esophageal reflux disease without esophagitis: Secondary | ICD-10-CM | POA: Diagnosis not present

## 2018-12-08 DIAGNOSIS — Z23 Encounter for immunization: Secondary | ICD-10-CM | POA: Diagnosis not present

## 2018-12-08 DIAGNOSIS — Z1389 Encounter for screening for other disorder: Secondary | ICD-10-CM | POA: Diagnosis not present

## 2018-12-08 DIAGNOSIS — I10 Essential (primary) hypertension: Secondary | ICD-10-CM | POA: Diagnosis not present

## 2018-12-08 DIAGNOSIS — Z0000 Encounter for general adult medical examination without abnormal findings: Secondary | ICD-10-CM | POA: Diagnosis not present

## 2018-12-08 DIAGNOSIS — J449 Chronic obstructive pulmonary disease, unspecified: Secondary | ICD-10-CM | POA: Diagnosis not present

## 2018-12-12 DIAGNOSIS — K219 Gastro-esophageal reflux disease without esophagitis: Secondary | ICD-10-CM | POA: Diagnosis not present

## 2018-12-12 DIAGNOSIS — G510 Bell's palsy: Secondary | ICD-10-CM | POA: Diagnosis not present

## 2018-12-12 DIAGNOSIS — Z86711 Personal history of pulmonary embolism: Secondary | ICD-10-CM | POA: Diagnosis not present

## 2018-12-12 DIAGNOSIS — Z794 Long term (current) use of insulin: Secondary | ICD-10-CM | POA: Diagnosis not present

## 2018-12-12 DIAGNOSIS — Z85118 Personal history of other malignant neoplasm of bronchus and lung: Secondary | ICD-10-CM | POA: Diagnosis not present

## 2018-12-12 DIAGNOSIS — K5790 Diverticulosis of intestine, part unspecified, without perforation or abscess without bleeding: Secondary | ICD-10-CM | POA: Diagnosis not present

## 2018-12-12 DIAGNOSIS — J449 Chronic obstructive pulmonary disease, unspecified: Secondary | ICD-10-CM | POA: Diagnosis not present

## 2018-12-12 DIAGNOSIS — E538 Deficiency of other specified B group vitamins: Secondary | ICD-10-CM | POA: Diagnosis not present

## 2018-12-12 DIAGNOSIS — R1310 Dysphagia, unspecified: Secondary | ICD-10-CM | POA: Diagnosis not present

## 2018-12-12 DIAGNOSIS — I69393 Ataxia following cerebral infarction: Secondary | ICD-10-CM | POA: Diagnosis not present

## 2018-12-12 DIAGNOSIS — I69351 Hemiplegia and hemiparesis following cerebral infarction affecting right dominant side: Secondary | ICD-10-CM | POA: Diagnosis not present

## 2018-12-12 DIAGNOSIS — I872 Venous insufficiency (chronic) (peripheral): Secondary | ICD-10-CM | POA: Diagnosis not present

## 2018-12-12 DIAGNOSIS — I69391 Dysphagia following cerebral infarction: Secondary | ICD-10-CM | POA: Diagnosis not present

## 2018-12-12 DIAGNOSIS — M48061 Spinal stenosis, lumbar region without neurogenic claudication: Secondary | ICD-10-CM | POA: Diagnosis not present

## 2018-12-12 DIAGNOSIS — I10 Essential (primary) hypertension: Secondary | ICD-10-CM | POA: Diagnosis not present

## 2018-12-12 DIAGNOSIS — M5136 Other intervertebral disc degeneration, lumbar region: Secondary | ICD-10-CM | POA: Diagnosis not present

## 2018-12-12 DIAGNOSIS — Z96652 Presence of left artificial knee joint: Secondary | ICD-10-CM | POA: Diagnosis not present

## 2018-12-12 DIAGNOSIS — Z9181 History of falling: Secondary | ICD-10-CM | POA: Diagnosis not present

## 2018-12-12 DIAGNOSIS — E1142 Type 2 diabetes mellitus with diabetic polyneuropathy: Secondary | ICD-10-CM | POA: Diagnosis not present

## 2018-12-12 DIAGNOSIS — E785 Hyperlipidemia, unspecified: Secondary | ICD-10-CM | POA: Diagnosis not present

## 2018-12-12 DIAGNOSIS — I2510 Atherosclerotic heart disease of native coronary artery without angina pectoris: Secondary | ICD-10-CM | POA: Diagnosis not present

## 2018-12-12 DIAGNOSIS — E1151 Type 2 diabetes mellitus with diabetic peripheral angiopathy without gangrene: Secondary | ICD-10-CM | POA: Diagnosis not present

## 2018-12-12 DIAGNOSIS — E119 Type 2 diabetes mellitus without complications: Secondary | ICD-10-CM | POA: Diagnosis not present

## 2018-12-12 DIAGNOSIS — I350 Nonrheumatic aortic (valve) stenosis: Secondary | ICD-10-CM | POA: Diagnosis not present

## 2018-12-13 DIAGNOSIS — J449 Chronic obstructive pulmonary disease, unspecified: Secondary | ICD-10-CM | POA: Diagnosis not present

## 2018-12-13 DIAGNOSIS — M5136 Other intervertebral disc degeneration, lumbar region: Secondary | ICD-10-CM | POA: Diagnosis not present

## 2018-12-13 DIAGNOSIS — I872 Venous insufficiency (chronic) (peripheral): Secondary | ICD-10-CM | POA: Diagnosis not present

## 2018-12-13 DIAGNOSIS — M48061 Spinal stenosis, lumbar region without neurogenic claudication: Secondary | ICD-10-CM | POA: Diagnosis not present

## 2018-12-13 DIAGNOSIS — E1151 Type 2 diabetes mellitus with diabetic peripheral angiopathy without gangrene: Secondary | ICD-10-CM | POA: Diagnosis not present

## 2018-12-13 DIAGNOSIS — E1142 Type 2 diabetes mellitus with diabetic polyneuropathy: Secondary | ICD-10-CM | POA: Diagnosis not present

## 2018-12-14 DIAGNOSIS — E1142 Type 2 diabetes mellitus with diabetic polyneuropathy: Secondary | ICD-10-CM | POA: Diagnosis not present

## 2018-12-14 DIAGNOSIS — E1151 Type 2 diabetes mellitus with diabetic peripheral angiopathy without gangrene: Secondary | ICD-10-CM | POA: Diagnosis not present

## 2018-12-14 DIAGNOSIS — I872 Venous insufficiency (chronic) (peripheral): Secondary | ICD-10-CM | POA: Diagnosis not present

## 2018-12-14 DIAGNOSIS — J449 Chronic obstructive pulmonary disease, unspecified: Secondary | ICD-10-CM | POA: Diagnosis not present

## 2018-12-14 DIAGNOSIS — M48061 Spinal stenosis, lumbar region without neurogenic claudication: Secondary | ICD-10-CM | POA: Diagnosis not present

## 2018-12-14 DIAGNOSIS — M5136 Other intervertebral disc degeneration, lumbar region: Secondary | ICD-10-CM | POA: Diagnosis not present

## 2018-12-15 DIAGNOSIS — E1151 Type 2 diabetes mellitus with diabetic peripheral angiopathy without gangrene: Secondary | ICD-10-CM | POA: Diagnosis not present

## 2018-12-15 DIAGNOSIS — I872 Venous insufficiency (chronic) (peripheral): Secondary | ICD-10-CM | POA: Diagnosis not present

## 2018-12-15 DIAGNOSIS — M48061 Spinal stenosis, lumbar region without neurogenic claudication: Secondary | ICD-10-CM | POA: Diagnosis not present

## 2018-12-15 DIAGNOSIS — E1142 Type 2 diabetes mellitus with diabetic polyneuropathy: Secondary | ICD-10-CM | POA: Diagnosis not present

## 2018-12-15 DIAGNOSIS — M5136 Other intervertebral disc degeneration, lumbar region: Secondary | ICD-10-CM | POA: Diagnosis not present

## 2018-12-15 DIAGNOSIS — J449 Chronic obstructive pulmonary disease, unspecified: Secondary | ICD-10-CM | POA: Diagnosis not present

## 2018-12-16 DIAGNOSIS — M48061 Spinal stenosis, lumbar region without neurogenic claudication: Secondary | ICD-10-CM | POA: Diagnosis not present

## 2018-12-16 DIAGNOSIS — E1151 Type 2 diabetes mellitus with diabetic peripheral angiopathy without gangrene: Secondary | ICD-10-CM | POA: Diagnosis not present

## 2018-12-16 DIAGNOSIS — E1142 Type 2 diabetes mellitus with diabetic polyneuropathy: Secondary | ICD-10-CM | POA: Diagnosis not present

## 2018-12-16 DIAGNOSIS — I872 Venous insufficiency (chronic) (peripheral): Secondary | ICD-10-CM | POA: Diagnosis not present

## 2018-12-16 DIAGNOSIS — M5136 Other intervertebral disc degeneration, lumbar region: Secondary | ICD-10-CM | POA: Diagnosis not present

## 2018-12-16 DIAGNOSIS — J449 Chronic obstructive pulmonary disease, unspecified: Secondary | ICD-10-CM | POA: Diagnosis not present

## 2018-12-19 DIAGNOSIS — E1142 Type 2 diabetes mellitus with diabetic polyneuropathy: Secondary | ICD-10-CM | POA: Diagnosis not present

## 2018-12-19 DIAGNOSIS — M48061 Spinal stenosis, lumbar region without neurogenic claudication: Secondary | ICD-10-CM | POA: Diagnosis not present

## 2018-12-19 DIAGNOSIS — M5136 Other intervertebral disc degeneration, lumbar region: Secondary | ICD-10-CM | POA: Diagnosis not present

## 2018-12-19 DIAGNOSIS — E1151 Type 2 diabetes mellitus with diabetic peripheral angiopathy without gangrene: Secondary | ICD-10-CM | POA: Diagnosis not present

## 2018-12-19 DIAGNOSIS — J449 Chronic obstructive pulmonary disease, unspecified: Secondary | ICD-10-CM | POA: Diagnosis not present

## 2018-12-19 DIAGNOSIS — I872 Venous insufficiency (chronic) (peripheral): Secondary | ICD-10-CM | POA: Diagnosis not present

## 2018-12-20 DIAGNOSIS — M48061 Spinal stenosis, lumbar region without neurogenic claudication: Secondary | ICD-10-CM | POA: Diagnosis not present

## 2018-12-20 DIAGNOSIS — J449 Chronic obstructive pulmonary disease, unspecified: Secondary | ICD-10-CM | POA: Diagnosis not present

## 2018-12-20 DIAGNOSIS — E1142 Type 2 diabetes mellitus with diabetic polyneuropathy: Secondary | ICD-10-CM | POA: Diagnosis not present

## 2018-12-20 DIAGNOSIS — E1151 Type 2 diabetes mellitus with diabetic peripheral angiopathy without gangrene: Secondary | ICD-10-CM | POA: Diagnosis not present

## 2018-12-20 DIAGNOSIS — I872 Venous insufficiency (chronic) (peripheral): Secondary | ICD-10-CM | POA: Diagnosis not present

## 2018-12-20 DIAGNOSIS — M5136 Other intervertebral disc degeneration, lumbar region: Secondary | ICD-10-CM | POA: Diagnosis not present

## 2018-12-22 DIAGNOSIS — I872 Venous insufficiency (chronic) (peripheral): Secondary | ICD-10-CM | POA: Diagnosis not present

## 2018-12-22 DIAGNOSIS — E1142 Type 2 diabetes mellitus with diabetic polyneuropathy: Secondary | ICD-10-CM | POA: Diagnosis not present

## 2018-12-22 DIAGNOSIS — M48061 Spinal stenosis, lumbar region without neurogenic claudication: Secondary | ICD-10-CM | POA: Diagnosis not present

## 2018-12-22 DIAGNOSIS — E1151 Type 2 diabetes mellitus with diabetic peripheral angiopathy without gangrene: Secondary | ICD-10-CM | POA: Diagnosis not present

## 2018-12-22 DIAGNOSIS — J449 Chronic obstructive pulmonary disease, unspecified: Secondary | ICD-10-CM | POA: Diagnosis not present

## 2018-12-22 DIAGNOSIS — M5136 Other intervertebral disc degeneration, lumbar region: Secondary | ICD-10-CM | POA: Diagnosis not present

## 2018-12-23 DIAGNOSIS — E1151 Type 2 diabetes mellitus with diabetic peripheral angiopathy without gangrene: Secondary | ICD-10-CM | POA: Diagnosis not present

## 2018-12-23 DIAGNOSIS — I872 Venous insufficiency (chronic) (peripheral): Secondary | ICD-10-CM | POA: Diagnosis not present

## 2018-12-23 DIAGNOSIS — M5136 Other intervertebral disc degeneration, lumbar region: Secondary | ICD-10-CM | POA: Diagnosis not present

## 2018-12-23 DIAGNOSIS — J449 Chronic obstructive pulmonary disease, unspecified: Secondary | ICD-10-CM | POA: Diagnosis not present

## 2018-12-23 DIAGNOSIS — E1142 Type 2 diabetes mellitus with diabetic polyneuropathy: Secondary | ICD-10-CM | POA: Diagnosis not present

## 2018-12-23 DIAGNOSIS — M48061 Spinal stenosis, lumbar region without neurogenic claudication: Secondary | ICD-10-CM | POA: Diagnosis not present

## 2018-12-26 DIAGNOSIS — E1151 Type 2 diabetes mellitus with diabetic peripheral angiopathy without gangrene: Secondary | ICD-10-CM | POA: Diagnosis not present

## 2018-12-26 DIAGNOSIS — M5136 Other intervertebral disc degeneration, lumbar region: Secondary | ICD-10-CM | POA: Diagnosis not present

## 2018-12-26 DIAGNOSIS — J449 Chronic obstructive pulmonary disease, unspecified: Secondary | ICD-10-CM | POA: Diagnosis not present

## 2018-12-26 DIAGNOSIS — M48061 Spinal stenosis, lumbar region without neurogenic claudication: Secondary | ICD-10-CM | POA: Diagnosis not present

## 2018-12-26 DIAGNOSIS — E1142 Type 2 diabetes mellitus with diabetic polyneuropathy: Secondary | ICD-10-CM | POA: Diagnosis not present

## 2018-12-26 DIAGNOSIS — I872 Venous insufficiency (chronic) (peripheral): Secondary | ICD-10-CM | POA: Diagnosis not present

## 2018-12-27 DIAGNOSIS — I872 Venous insufficiency (chronic) (peripheral): Secondary | ICD-10-CM | POA: Diagnosis not present

## 2018-12-27 DIAGNOSIS — M5136 Other intervertebral disc degeneration, lumbar region: Secondary | ICD-10-CM | POA: Diagnosis not present

## 2018-12-27 DIAGNOSIS — E1142 Type 2 diabetes mellitus with diabetic polyneuropathy: Secondary | ICD-10-CM | POA: Diagnosis not present

## 2018-12-27 DIAGNOSIS — M48061 Spinal stenosis, lumbar region without neurogenic claudication: Secondary | ICD-10-CM | POA: Diagnosis not present

## 2018-12-27 DIAGNOSIS — E1151 Type 2 diabetes mellitus with diabetic peripheral angiopathy without gangrene: Secondary | ICD-10-CM | POA: Diagnosis not present

## 2018-12-27 DIAGNOSIS — J449 Chronic obstructive pulmonary disease, unspecified: Secondary | ICD-10-CM | POA: Diagnosis not present

## 2018-12-29 DIAGNOSIS — M48061 Spinal stenosis, lumbar region without neurogenic claudication: Secondary | ICD-10-CM | POA: Diagnosis not present

## 2018-12-29 DIAGNOSIS — J449 Chronic obstructive pulmonary disease, unspecified: Secondary | ICD-10-CM | POA: Diagnosis not present

## 2018-12-29 DIAGNOSIS — I872 Venous insufficiency (chronic) (peripheral): Secondary | ICD-10-CM | POA: Diagnosis not present

## 2018-12-29 DIAGNOSIS — M5136 Other intervertebral disc degeneration, lumbar region: Secondary | ICD-10-CM | POA: Diagnosis not present

## 2018-12-29 DIAGNOSIS — E1151 Type 2 diabetes mellitus with diabetic peripheral angiopathy without gangrene: Secondary | ICD-10-CM | POA: Diagnosis not present

## 2018-12-29 DIAGNOSIS — E1142 Type 2 diabetes mellitus with diabetic polyneuropathy: Secondary | ICD-10-CM | POA: Diagnosis not present

## 2018-12-30 DIAGNOSIS — J449 Chronic obstructive pulmonary disease, unspecified: Secondary | ICD-10-CM | POA: Diagnosis not present

## 2018-12-30 DIAGNOSIS — H60332 Swimmer's ear, left ear: Secondary | ICD-10-CM | POA: Diagnosis not present

## 2018-12-30 DIAGNOSIS — E1140 Type 2 diabetes mellitus with diabetic neuropathy, unspecified: Secondary | ICD-10-CM | POA: Diagnosis not present

## 2018-12-30 DIAGNOSIS — Z87891 Personal history of nicotine dependence: Secondary | ICD-10-CM | POA: Diagnosis not present

## 2018-12-30 DIAGNOSIS — H9202 Otalgia, left ear: Secondary | ICD-10-CM | POA: Diagnosis not present

## 2018-12-30 DIAGNOSIS — M48061 Spinal stenosis, lumbar region without neurogenic claudication: Secondary | ICD-10-CM | POA: Diagnosis not present

## 2018-12-30 DIAGNOSIS — E1142 Type 2 diabetes mellitus with diabetic polyneuropathy: Secondary | ICD-10-CM | POA: Diagnosis not present

## 2018-12-30 DIAGNOSIS — M5136 Other intervertebral disc degeneration, lumbar region: Secondary | ICD-10-CM | POA: Diagnosis not present

## 2018-12-30 DIAGNOSIS — T162XXA Foreign body in left ear, initial encounter: Secondary | ICD-10-CM | POA: Diagnosis not present

## 2018-12-30 DIAGNOSIS — H906 Mixed conductive and sensorineural hearing loss, bilateral: Secondary | ICD-10-CM | POA: Diagnosis not present

## 2018-12-30 DIAGNOSIS — E1151 Type 2 diabetes mellitus with diabetic peripheral angiopathy without gangrene: Secondary | ICD-10-CM | POA: Diagnosis not present

## 2018-12-30 DIAGNOSIS — I872 Venous insufficiency (chronic) (peripheral): Secondary | ICD-10-CM | POA: Diagnosis not present

## 2019-01-02 DIAGNOSIS — M48061 Spinal stenosis, lumbar region without neurogenic claudication: Secondary | ICD-10-CM | POA: Diagnosis not present

## 2019-01-02 DIAGNOSIS — J449 Chronic obstructive pulmonary disease, unspecified: Secondary | ICD-10-CM | POA: Diagnosis not present

## 2019-01-02 DIAGNOSIS — I872 Venous insufficiency (chronic) (peripheral): Secondary | ICD-10-CM | POA: Diagnosis not present

## 2019-01-02 DIAGNOSIS — M5136 Other intervertebral disc degeneration, lumbar region: Secondary | ICD-10-CM | POA: Diagnosis not present

## 2019-01-02 DIAGNOSIS — E1151 Type 2 diabetes mellitus with diabetic peripheral angiopathy without gangrene: Secondary | ICD-10-CM | POA: Diagnosis not present

## 2019-01-02 DIAGNOSIS — E1142 Type 2 diabetes mellitus with diabetic polyneuropathy: Secondary | ICD-10-CM | POA: Diagnosis not present

## 2019-01-03 DIAGNOSIS — I872 Venous insufficiency (chronic) (peripheral): Secondary | ICD-10-CM | POA: Diagnosis not present

## 2019-01-03 DIAGNOSIS — J449 Chronic obstructive pulmonary disease, unspecified: Secondary | ICD-10-CM | POA: Diagnosis not present

## 2019-01-03 DIAGNOSIS — M48061 Spinal stenosis, lumbar region without neurogenic claudication: Secondary | ICD-10-CM | POA: Diagnosis not present

## 2019-01-03 DIAGNOSIS — M5136 Other intervertebral disc degeneration, lumbar region: Secondary | ICD-10-CM | POA: Diagnosis not present

## 2019-01-03 DIAGNOSIS — E1151 Type 2 diabetes mellitus with diabetic peripheral angiopathy without gangrene: Secondary | ICD-10-CM | POA: Diagnosis not present

## 2019-01-03 DIAGNOSIS — E1142 Type 2 diabetes mellitus with diabetic polyneuropathy: Secondary | ICD-10-CM | POA: Diagnosis not present

## 2019-01-04 DIAGNOSIS — E1142 Type 2 diabetes mellitus with diabetic polyneuropathy: Secondary | ICD-10-CM | POA: Diagnosis not present

## 2019-01-04 DIAGNOSIS — M5136 Other intervertebral disc degeneration, lumbar region: Secondary | ICD-10-CM | POA: Diagnosis not present

## 2019-01-04 DIAGNOSIS — E1151 Type 2 diabetes mellitus with diabetic peripheral angiopathy without gangrene: Secondary | ICD-10-CM | POA: Diagnosis not present

## 2019-01-04 DIAGNOSIS — J449 Chronic obstructive pulmonary disease, unspecified: Secondary | ICD-10-CM | POA: Diagnosis not present

## 2019-01-04 DIAGNOSIS — I872 Venous insufficiency (chronic) (peripheral): Secondary | ICD-10-CM | POA: Diagnosis not present

## 2019-01-04 DIAGNOSIS — M48061 Spinal stenosis, lumbar region without neurogenic claudication: Secondary | ICD-10-CM | POA: Diagnosis not present

## 2019-01-09 DIAGNOSIS — M48061 Spinal stenosis, lumbar region without neurogenic claudication: Secondary | ICD-10-CM | POA: Diagnosis not present

## 2019-01-09 DIAGNOSIS — M5136 Other intervertebral disc degeneration, lumbar region: Secondary | ICD-10-CM | POA: Diagnosis not present

## 2019-01-09 DIAGNOSIS — J449 Chronic obstructive pulmonary disease, unspecified: Secondary | ICD-10-CM | POA: Diagnosis not present

## 2019-01-09 DIAGNOSIS — E1151 Type 2 diabetes mellitus with diabetic peripheral angiopathy without gangrene: Secondary | ICD-10-CM | POA: Diagnosis not present

## 2019-01-09 DIAGNOSIS — I872 Venous insufficiency (chronic) (peripheral): Secondary | ICD-10-CM | POA: Diagnosis not present

## 2019-01-09 DIAGNOSIS — E1142 Type 2 diabetes mellitus with diabetic polyneuropathy: Secondary | ICD-10-CM | POA: Diagnosis not present

## 2019-01-11 DIAGNOSIS — E1151 Type 2 diabetes mellitus with diabetic peripheral angiopathy without gangrene: Secondary | ICD-10-CM | POA: Diagnosis not present

## 2019-01-11 DIAGNOSIS — I872 Venous insufficiency (chronic) (peripheral): Secondary | ICD-10-CM | POA: Diagnosis not present

## 2019-01-11 DIAGNOSIS — J449 Chronic obstructive pulmonary disease, unspecified: Secondary | ICD-10-CM | POA: Diagnosis not present

## 2019-01-11 DIAGNOSIS — M5136 Other intervertebral disc degeneration, lumbar region: Secondary | ICD-10-CM | POA: Diagnosis not present

## 2019-01-11 DIAGNOSIS — M48061 Spinal stenosis, lumbar region without neurogenic claudication: Secondary | ICD-10-CM | POA: Diagnosis not present

## 2019-01-11 DIAGNOSIS — E1142 Type 2 diabetes mellitus with diabetic polyneuropathy: Secondary | ICD-10-CM | POA: Diagnosis not present

## 2019-01-12 ENCOUNTER — Ambulatory Visit: Payer: Medicare Other | Admitting: Podiatry

## 2019-01-13 DIAGNOSIS — I872 Venous insufficiency (chronic) (peripheral): Secondary | ICD-10-CM | POA: Diagnosis not present

## 2019-01-13 DIAGNOSIS — E1151 Type 2 diabetes mellitus with diabetic peripheral angiopathy without gangrene: Secondary | ICD-10-CM | POA: Diagnosis not present

## 2019-01-13 DIAGNOSIS — E1142 Type 2 diabetes mellitus with diabetic polyneuropathy: Secondary | ICD-10-CM | POA: Diagnosis not present

## 2019-01-13 DIAGNOSIS — J449 Chronic obstructive pulmonary disease, unspecified: Secondary | ICD-10-CM | POA: Diagnosis not present

## 2019-01-13 DIAGNOSIS — M48061 Spinal stenosis, lumbar region without neurogenic claudication: Secondary | ICD-10-CM | POA: Diagnosis not present

## 2019-01-13 DIAGNOSIS — M5136 Other intervertebral disc degeneration, lumbar region: Secondary | ICD-10-CM | POA: Diagnosis not present

## 2019-01-16 DIAGNOSIS — I872 Venous insufficiency (chronic) (peripheral): Secondary | ICD-10-CM | POA: Diagnosis not present

## 2019-01-16 DIAGNOSIS — M48061 Spinal stenosis, lumbar region without neurogenic claudication: Secondary | ICD-10-CM | POA: Diagnosis not present

## 2019-01-16 DIAGNOSIS — J449 Chronic obstructive pulmonary disease, unspecified: Secondary | ICD-10-CM | POA: Diagnosis not present

## 2019-01-16 DIAGNOSIS — E1142 Type 2 diabetes mellitus with diabetic polyneuropathy: Secondary | ICD-10-CM | POA: Diagnosis not present

## 2019-01-16 DIAGNOSIS — M5136 Other intervertebral disc degeneration, lumbar region: Secondary | ICD-10-CM | POA: Diagnosis not present

## 2019-01-16 DIAGNOSIS — E1151 Type 2 diabetes mellitus with diabetic peripheral angiopathy without gangrene: Secondary | ICD-10-CM | POA: Diagnosis not present

## 2019-01-17 DIAGNOSIS — E1142 Type 2 diabetes mellitus with diabetic polyneuropathy: Secondary | ICD-10-CM | POA: Diagnosis not present

## 2019-01-17 DIAGNOSIS — M5136 Other intervertebral disc degeneration, lumbar region: Secondary | ICD-10-CM | POA: Diagnosis not present

## 2019-01-17 DIAGNOSIS — M48061 Spinal stenosis, lumbar region without neurogenic claudication: Secondary | ICD-10-CM | POA: Diagnosis not present

## 2019-01-17 DIAGNOSIS — I872 Venous insufficiency (chronic) (peripheral): Secondary | ICD-10-CM | POA: Diagnosis not present

## 2019-01-17 DIAGNOSIS — E1151 Type 2 diabetes mellitus with diabetic peripheral angiopathy without gangrene: Secondary | ICD-10-CM | POA: Diagnosis not present

## 2019-01-17 DIAGNOSIS — J449 Chronic obstructive pulmonary disease, unspecified: Secondary | ICD-10-CM | POA: Diagnosis not present

## 2019-01-18 DIAGNOSIS — M5136 Other intervertebral disc degeneration, lumbar region: Secondary | ICD-10-CM | POA: Diagnosis not present

## 2019-01-18 DIAGNOSIS — J449 Chronic obstructive pulmonary disease, unspecified: Secondary | ICD-10-CM | POA: Diagnosis not present

## 2019-01-18 DIAGNOSIS — M48061 Spinal stenosis, lumbar region without neurogenic claudication: Secondary | ICD-10-CM | POA: Diagnosis not present

## 2019-01-18 DIAGNOSIS — E1142 Type 2 diabetes mellitus with diabetic polyneuropathy: Secondary | ICD-10-CM | POA: Diagnosis not present

## 2019-01-18 DIAGNOSIS — E1151 Type 2 diabetes mellitus with diabetic peripheral angiopathy without gangrene: Secondary | ICD-10-CM | POA: Diagnosis not present

## 2019-01-18 DIAGNOSIS — I872 Venous insufficiency (chronic) (peripheral): Secondary | ICD-10-CM | POA: Diagnosis not present

## 2019-01-23 DIAGNOSIS — M5136 Other intervertebral disc degeneration, lumbar region: Secondary | ICD-10-CM | POA: Diagnosis not present

## 2019-01-23 DIAGNOSIS — E1151 Type 2 diabetes mellitus with diabetic peripheral angiopathy without gangrene: Secondary | ICD-10-CM | POA: Diagnosis not present

## 2019-01-23 DIAGNOSIS — J449 Chronic obstructive pulmonary disease, unspecified: Secondary | ICD-10-CM | POA: Diagnosis not present

## 2019-01-23 DIAGNOSIS — I872 Venous insufficiency (chronic) (peripheral): Secondary | ICD-10-CM | POA: Diagnosis not present

## 2019-01-23 DIAGNOSIS — M48061 Spinal stenosis, lumbar region without neurogenic claudication: Secondary | ICD-10-CM | POA: Diagnosis not present

## 2019-01-23 DIAGNOSIS — E1142 Type 2 diabetes mellitus with diabetic polyneuropathy: Secondary | ICD-10-CM | POA: Diagnosis not present

## 2019-01-27 DIAGNOSIS — M48061 Spinal stenosis, lumbar region without neurogenic claudication: Secondary | ICD-10-CM | POA: Diagnosis not present

## 2019-01-27 DIAGNOSIS — E1151 Type 2 diabetes mellitus with diabetic peripheral angiopathy without gangrene: Secondary | ICD-10-CM | POA: Diagnosis not present

## 2019-01-27 DIAGNOSIS — E1142 Type 2 diabetes mellitus with diabetic polyneuropathy: Secondary | ICD-10-CM | POA: Diagnosis not present

## 2019-01-27 DIAGNOSIS — J449 Chronic obstructive pulmonary disease, unspecified: Secondary | ICD-10-CM | POA: Diagnosis not present

## 2019-01-27 DIAGNOSIS — I872 Venous insufficiency (chronic) (peripheral): Secondary | ICD-10-CM | POA: Diagnosis not present

## 2019-01-27 DIAGNOSIS — M5136 Other intervertebral disc degeneration, lumbar region: Secondary | ICD-10-CM | POA: Diagnosis not present

## 2019-01-30 DIAGNOSIS — E1142 Type 2 diabetes mellitus with diabetic polyneuropathy: Secondary | ICD-10-CM | POA: Diagnosis not present

## 2019-01-30 DIAGNOSIS — J449 Chronic obstructive pulmonary disease, unspecified: Secondary | ICD-10-CM | POA: Diagnosis not present

## 2019-01-30 DIAGNOSIS — M5136 Other intervertebral disc degeneration, lumbar region: Secondary | ICD-10-CM | POA: Diagnosis not present

## 2019-01-30 DIAGNOSIS — M48061 Spinal stenosis, lumbar region without neurogenic claudication: Secondary | ICD-10-CM | POA: Diagnosis not present

## 2019-01-30 DIAGNOSIS — I872 Venous insufficiency (chronic) (peripheral): Secondary | ICD-10-CM | POA: Diagnosis not present

## 2019-01-30 DIAGNOSIS — E1151 Type 2 diabetes mellitus with diabetic peripheral angiopathy without gangrene: Secondary | ICD-10-CM | POA: Diagnosis not present

## 2019-02-03 DIAGNOSIS — E1142 Type 2 diabetes mellitus with diabetic polyneuropathy: Secondary | ICD-10-CM | POA: Diagnosis not present

## 2019-02-03 DIAGNOSIS — E1151 Type 2 diabetes mellitus with diabetic peripheral angiopathy without gangrene: Secondary | ICD-10-CM | POA: Diagnosis not present

## 2019-02-03 DIAGNOSIS — J449 Chronic obstructive pulmonary disease, unspecified: Secondary | ICD-10-CM | POA: Diagnosis not present

## 2019-02-03 DIAGNOSIS — I872 Venous insufficiency (chronic) (peripheral): Secondary | ICD-10-CM | POA: Diagnosis not present

## 2019-02-03 DIAGNOSIS — M48061 Spinal stenosis, lumbar region without neurogenic claudication: Secondary | ICD-10-CM | POA: Diagnosis not present

## 2019-02-03 DIAGNOSIS — M5136 Other intervertebral disc degeneration, lumbar region: Secondary | ICD-10-CM | POA: Diagnosis not present

## 2019-02-28 DIAGNOSIS — I10 Essential (primary) hypertension: Secondary | ICD-10-CM | POA: Diagnosis not present

## 2019-02-28 DIAGNOSIS — L89312 Pressure ulcer of right buttock, stage 2: Secondary | ICD-10-CM | POA: Diagnosis not present

## 2019-02-28 DIAGNOSIS — J449 Chronic obstructive pulmonary disease, unspecified: Secondary | ICD-10-CM | POA: Diagnosis not present

## 2019-03-17 ENCOUNTER — Emergency Department (HOSPITAL_COMMUNITY): Payer: Medicare Other

## 2019-03-17 ENCOUNTER — Other Ambulatory Visit: Payer: Self-pay

## 2019-03-17 ENCOUNTER — Encounter (HOSPITAL_COMMUNITY): Payer: Self-pay | Admitting: Pharmacy Technician

## 2019-03-17 ENCOUNTER — Inpatient Hospital Stay (HOSPITAL_COMMUNITY)
Admission: EM | Admit: 2019-03-17 | Discharge: 2019-03-21 | DRG: 041 | Disposition: A | Discharge status: Skilled Nursing Facility | Payer: Medicare Other | Attending: Internal Medicine | Admitting: Internal Medicine

## 2019-03-17 DIAGNOSIS — E785 Hyperlipidemia, unspecified: Secondary | ICD-10-CM | POA: Diagnosis present

## 2019-03-17 DIAGNOSIS — Y92012 Bathroom of single-family (private) house as the place of occurrence of the external cause: Secondary | ICD-10-CM

## 2019-03-17 DIAGNOSIS — R4701 Aphasia: Secondary | ICD-10-CM | POA: Diagnosis present

## 2019-03-17 DIAGNOSIS — J449 Chronic obstructive pulmonary disease, unspecified: Secondary | ICD-10-CM | POA: Diagnosis present

## 2019-03-17 DIAGNOSIS — R7989 Other specified abnormal findings of blood chemistry: Secondary | ICD-10-CM

## 2019-03-17 DIAGNOSIS — Z0000 Encounter for general adult medical examination without abnormal findings: Secondary | ICD-10-CM | POA: Diagnosis not present

## 2019-03-17 DIAGNOSIS — Z7982 Long term (current) use of aspirin: Secondary | ICD-10-CM

## 2019-03-17 DIAGNOSIS — Z87891 Personal history of nicotine dependence: Secondary | ICD-10-CM

## 2019-03-17 DIAGNOSIS — E860 Dehydration: Secondary | ICD-10-CM | POA: Diagnosis present

## 2019-03-17 DIAGNOSIS — Z955 Presence of coronary angioplasty implant and graft: Secondary | ICD-10-CM

## 2019-03-17 DIAGNOSIS — I248 Other forms of acute ischemic heart disease: Secondary | ICD-10-CM | POA: Diagnosis present

## 2019-03-17 DIAGNOSIS — R778 Other specified abnormalities of plasma proteins: Secondary | ICD-10-CM

## 2019-03-17 DIAGNOSIS — E119 Type 2 diabetes mellitus without complications: Secondary | ICD-10-CM | POA: Diagnosis not present

## 2019-03-17 DIAGNOSIS — S99912A Unspecified injury of left ankle, initial encounter: Secondary | ICD-10-CM | POA: Diagnosis not present

## 2019-03-17 DIAGNOSIS — I639 Cerebral infarction, unspecified: Secondary | ICD-10-CM | POA: Diagnosis present

## 2019-03-17 DIAGNOSIS — I2510 Atherosclerotic heart disease of native coronary artery without angina pectoris: Secondary | ICD-10-CM | POA: Diagnosis present

## 2019-03-17 DIAGNOSIS — N179 Acute kidney failure, unspecified: Secondary | ICD-10-CM | POA: Diagnosis not present

## 2019-03-17 DIAGNOSIS — E876 Hypokalemia: Secondary | ICD-10-CM

## 2019-03-17 DIAGNOSIS — Z85118 Personal history of other malignant neoplasm of bronchus and lung: Secondary | ICD-10-CM

## 2019-03-17 DIAGNOSIS — Z833 Family history of diabetes mellitus: Secondary | ICD-10-CM

## 2019-03-17 DIAGNOSIS — I69391 Dysphagia following cerebral infarction: Secondary | ICD-10-CM

## 2019-03-17 DIAGNOSIS — G459 Transient cerebral ischemic attack, unspecified: Secondary | ICD-10-CM | POA: Diagnosis not present

## 2019-03-17 DIAGNOSIS — S91205A Unspecified open wound of left lesser toe(s) with damage to nail, initial encounter: Secondary | ICD-10-CM | POA: Diagnosis present

## 2019-03-17 DIAGNOSIS — I5032 Chronic diastolic (congestive) heart failure: Secondary | ICD-10-CM

## 2019-03-17 DIAGNOSIS — Z66 Do not resuscitate: Secondary | ICD-10-CM | POA: Diagnosis present

## 2019-03-17 DIAGNOSIS — I6381 Other cerebral infarction due to occlusion or stenosis of small artery: Principal | ICD-10-CM | POA: Diagnosis present

## 2019-03-17 DIAGNOSIS — K5900 Constipation, unspecified: Secondary | ICD-10-CM | POA: Diagnosis not present

## 2019-03-17 DIAGNOSIS — H9190 Unspecified hearing loss, unspecified ear: Secondary | ICD-10-CM | POA: Diagnosis present

## 2019-03-17 DIAGNOSIS — E1165 Type 2 diabetes mellitus with hyperglycemia: Secondary | ICD-10-CM | POA: Diagnosis not present

## 2019-03-17 DIAGNOSIS — Z86718 Personal history of other venous thrombosis and embolism: Secondary | ICD-10-CM

## 2019-03-17 DIAGNOSIS — Z902 Acquired absence of lung [part of]: Secondary | ICD-10-CM

## 2019-03-17 DIAGNOSIS — R32 Unspecified urinary incontinence: Secondary | ICD-10-CM | POA: Diagnosis present

## 2019-03-17 DIAGNOSIS — R0902 Hypoxemia: Secondary | ICD-10-CM | POA: Diagnosis not present

## 2019-03-17 DIAGNOSIS — Z888 Allergy status to other drugs, medicaments and biological substances status: Secondary | ICD-10-CM

## 2019-03-17 DIAGNOSIS — E1140 Type 2 diabetes mellitus with diabetic neuropathy, unspecified: Secondary | ICD-10-CM | POA: Diagnosis not present

## 2019-03-17 DIAGNOSIS — R29810 Facial weakness: Secondary | ICD-10-CM | POA: Diagnosis present

## 2019-03-17 DIAGNOSIS — I10 Essential (primary) hypertension: Secondary | ICD-10-CM | POA: Diagnosis not present

## 2019-03-17 DIAGNOSIS — R29898 Other symptoms and signs involving the musculoskeletal system: Secondary | ICD-10-CM | POA: Diagnosis present

## 2019-03-17 DIAGNOSIS — R269 Unspecified abnormalities of gait and mobility: Secondary | ICD-10-CM | POA: Diagnosis not present

## 2019-03-17 DIAGNOSIS — R000 Tachycardia, unspecified: Secondary | ICD-10-CM | POA: Diagnosis not present

## 2019-03-17 DIAGNOSIS — W19XXXA Unspecified fall, initial encounter: Secondary | ICD-10-CM | POA: Diagnosis not present

## 2019-03-17 DIAGNOSIS — K219 Gastro-esophageal reflux disease without esophagitis: Secondary | ICD-10-CM | POA: Diagnosis not present

## 2019-03-17 DIAGNOSIS — L988 Other specified disorders of the skin and subcutaneous tissue: Secondary | ICD-10-CM | POA: Diagnosis present

## 2019-03-17 DIAGNOSIS — Y939 Activity, unspecified: Secondary | ICD-10-CM

## 2019-03-17 DIAGNOSIS — R4781 Slurred speech: Secondary | ICD-10-CM | POA: Diagnosis not present

## 2019-03-17 DIAGNOSIS — I110 Hypertensive heart disease with heart failure: Secondary | ICD-10-CM | POA: Diagnosis present

## 2019-03-17 DIAGNOSIS — S3993XA Unspecified injury of pelvis, initial encounter: Secondary | ICD-10-CM | POA: Diagnosis not present

## 2019-03-17 DIAGNOSIS — D649 Anemia, unspecified: Secondary | ICD-10-CM | POA: Diagnosis present

## 2019-03-17 DIAGNOSIS — N400 Enlarged prostate without lower urinary tract symptoms: Secondary | ICD-10-CM | POA: Diagnosis present

## 2019-03-17 DIAGNOSIS — Z79899 Other long term (current) drug therapy: Secondary | ICD-10-CM

## 2019-03-17 DIAGNOSIS — L8990 Pressure ulcer of unspecified site, unspecified stage: Secondary | ICD-10-CM

## 2019-03-17 DIAGNOSIS — R531 Weakness: Secondary | ICD-10-CM | POA: Diagnosis not present

## 2019-03-17 DIAGNOSIS — S0990XA Unspecified injury of head, initial encounter: Secondary | ICD-10-CM | POA: Diagnosis not present

## 2019-03-17 DIAGNOSIS — G8194 Hemiplegia, unspecified affecting left nondominant side: Secondary | ICD-10-CM | POA: Diagnosis not present

## 2019-03-17 DIAGNOSIS — I6340 Cerebral infarction due to embolism of unspecified cerebral artery: Secondary | ICD-10-CM

## 2019-03-17 DIAGNOSIS — E1151 Type 2 diabetes mellitus with diabetic peripheral angiopathy without gangrene: Secondary | ICD-10-CM | POA: Diagnosis present

## 2019-03-17 DIAGNOSIS — Z86711 Personal history of pulmonary embolism: Secondary | ICD-10-CM

## 2019-03-17 LAB — CBC WITH DIFFERENTIAL/PLATELET
Abs Immature Granulocytes: 0.04 10*3/uL (ref 0.00–0.07)
Basophils Absolute: 0 10*3/uL (ref 0.0–0.1)
Basophils Relative: 0 %
EOS PCT: 1 %
Eosinophils Absolute: 0.1 10*3/uL (ref 0.0–0.5)
HEMATOCRIT: 36.7 % — AB (ref 39.0–52.0)
Hemoglobin: 12.7 g/dL — ABNORMAL LOW (ref 13.0–17.0)
Immature Granulocytes: 1 %
LYMPHS ABS: 1.2 10*3/uL (ref 0.7–4.0)
Lymphocytes Relative: 20 %
MCH: 32.1 pg (ref 26.0–34.0)
MCHC: 34.6 g/dL (ref 30.0–36.0)
MCV: 92.7 fL (ref 80.0–100.0)
MONO ABS: 0.5 10*3/uL (ref 0.1–1.0)
MONOS PCT: 8 %
Neutro Abs: 4.2 10*3/uL (ref 1.7–7.7)
Neutrophils Relative %: 70 %
Platelets: UNDETERMINED 10*3/uL (ref 150–400)
RBC: 3.96 MIL/uL — ABNORMAL LOW (ref 4.22–5.81)
RDW: 12.5 % (ref 11.5–15.5)
WBC: 6 10*3/uL (ref 4.0–10.5)
nRBC: 0 % (ref 0.0–0.2)

## 2019-03-17 LAB — URINALYSIS, ROUTINE W REFLEX MICROSCOPIC
Bacteria, UA: NONE SEEN
Bilirubin Urine: NEGATIVE
GLUCOSE, UA: 150 mg/dL — AB
Ketones, ur: NEGATIVE mg/dL
Leukocytes,Ua: NEGATIVE
NITRITE: NEGATIVE
PH: 5 (ref 5.0–8.0)
PROTEIN: NEGATIVE mg/dL
Specific Gravity, Urine: 1.015 (ref 1.005–1.030)

## 2019-03-17 LAB — COMPREHENSIVE METABOLIC PANEL
ALT: 14 U/L (ref 0–44)
AST: 27 U/L (ref 15–41)
Albumin: 3.3 g/dL — ABNORMAL LOW (ref 3.5–5.0)
Alkaline Phosphatase: 93 U/L (ref 38–126)
Anion gap: 9 (ref 5–15)
BUN: 28 mg/dL — ABNORMAL HIGH (ref 8–23)
CHLORIDE: 97 mmol/L — AB (ref 98–111)
CO2: 26 mmol/L (ref 22–32)
CREATININE: 1.25 mg/dL — AB (ref 0.61–1.24)
Calcium: 8.8 mg/dL — ABNORMAL LOW (ref 8.9–10.3)
GFR, EST AFRICAN AMERICAN: 58 mL/min — AB (ref >60)
GFR, EST NON AFRICAN AMERICAN: 50 mL/min — AB (ref >60)
Glucose, Bld: 249 mg/dL — ABNORMAL HIGH (ref 70–99)
Potassium: 3.3 mmol/L — ABNORMAL LOW (ref 3.5–5.1)
Sodium: 132 mmol/L — ABNORMAL LOW (ref 135–145)
Total Bilirubin: 1.3 mg/dL — ABNORMAL HIGH (ref 0.3–1.2)
Total Protein: 6.2 g/dL — ABNORMAL LOW (ref 6.5–8.1)

## 2019-03-17 LAB — CBG MONITORING, ED: Glucose-Capillary: 245 mg/dL — ABNORMAL HIGH (ref 70–99)

## 2019-03-17 LAB — CK: Total CK: 150 U/L (ref 49–397)

## 2019-03-17 LAB — TROPONIN I: Troponin I: 0.08 ng/mL (ref <0.03)

## 2019-03-17 MED ORDER — SODIUM CHLORIDE 0.9 % IV BOLUS
500.0000 mL | Freq: Once | INTRAVENOUS | Status: AC
  Administered 2019-03-17: 500 mL via INTRAVENOUS

## 2019-03-17 MED ORDER — SODIUM CHLORIDE 0.9 % IV SOLN: INTRAVENOUS | Status: DC

## 2019-03-17 NOTE — ED Notes (Signed)
Charlesetta Garibaldi daughter in [place] wants an update if available to return call [phone]

## 2019-03-17 NOTE — ED Notes (Signed)
Pt with foul odor noted to urine.

## 2019-03-17 NOTE — ED Triage Notes (Signed)
Pt arrives via ems from home with reports of generalized weakness and fatigue. Per EMS, pt fell today at approx 0600 ( not on thinners ) and was naked and incontinent of urine on scene. Sometime later today pt developed LLE weakness and was aphasic. Pt family took pt to PCP. Symptoms resolved. Pt arrives with pants and socks drenching wet. Dried blood noted to 2nd digit on left foot. 103 ST, 118/72, RR 18, 92% RA, CBG 309.

## 2019-03-17 NOTE — ED Provider Notes (Signed)
[facility] EMERGENCY DEPARTMENT Provider Note   CSN: [id number] Arrival date & time: 03/17/19  2016    History   Chief Complaint Chief Complaint  Patient presents with   Weakness    HPI Chad Fuller is a 85 y.o. male.     Patient with history of pulmonary embolism, CAD, stroke without deficits per report presents with general weakness and fatigue and was naked incontinent of urine on scene.  Patient lives at home with family taking care of him per report.  Family took the patient to primary care's office who sent the patient in the emergency room.  Neuro symptoms resolved, patient had transient left-sided weakness lower extremity and aphasia.  Dried blood to the second digit of the left foot.     Past Medical History:  Diagnosis Date   CAD (coronary artery disease)    Cancer ([place])    lung   Diabetes mellitus    History of blood clots    Prostate enlargement    Pulmonary embolus [facility]) july 2005    Patient Active Problem List   Diagnosis Date Noted   Rectal bleeding 07/23/2015   GI bleed 07/23/2015   Hypotension 07/23/2015   Left pontine CVA ([place]) 11/20/2014   Dysphagia, post-stroke 11/20/2014   TIA (transient ischemic attack) 11/19/2014   Type 2 diabetes mellitus ([place]) 11/19/2014   Aortic heart murmur 11/19/2014   Non-small cell carcinoma of lung ([place]) 11/19/2014   Recurrent falls 11/19/2014   Dizziness and giddiness 11/19/2014   CVA (cerebral infarction) 11/19/2014   Right hemiparesis ([place])    Type II or unspecified type diabetes mellitus with neurological manifestations, not stated as uncontrolled 11/14/2013   Pain 11/14/2013   Closed fracture of metatarsal bone(s) 10/10/2013   ASTHMA 02/23/2009   Coronary atherosclerosis 02/05/2009   NEOPLASM, MALIGNANT, LUNG 02/04/2009   DIABETES, TYPE 2 02/04/2009   PULMONARY EMBOLISM 02/04/2009   C O P D 02/04/2009   DYSPNEA 02/04/2009    Past Surgical History:    Procedure Laterality Date   COLONOSCOPY N/A 07/24/2015   Procedure: COLONOSCOPY;  Surgeon: Laurence Spates, MD;  Location: [place];  Service: Endoscopy;  Laterality: N/A;   CORONARY STENT PLACEMENT     ESOPHAGOGASTRODUODENOSCOPY N/A 07/24/2015   Procedure: ESOPHAGOGASTRODUODENOSCOPY (EGD);  Surgeon: Laurence Spates, MD;  Location[facility] ENDOSCOPY;  Service: Endoscopy;  Laterality: N/A;  bleeding source not in colon; EGD performed to locate source   FILTERING PROCEDURE     reports filter placed after knee surgery for blood clots   I&D EXTREMITY  08/16/2012   Procedure: MINOR IRRIGATION AND DEBRIDEMENT EXTREMITY;  Surgeon: Tennis Must, MD;  Location: [facility];  Service: Orthopedics;  Laterality: Right;   KNEE SURGERY     LUNG REMOVAL, PARTIAL          Home Medications    Prior to Admission medications   Medication Sig Start Date End Date Taking? Authorizing Provider  ACCU-CHEK AVIVA PLUS test strip AS DIRECTED DX E11.65 THREE TIMES A DAY 90 DAYS 08/15/18   [provider]  alfuzosin (UROXATRAL) 10 MG 24 hr tablet  10/07/18   [provider]  aspirin 81 MG tablet Take 81 mg by mouth daily.    [provider]  B-D ULTRAFINE III SHORT PEN 31G X 8 MM MISC USE ONCE A DAY WITH TRESIBA FLEXPEN 07/22/18   [provider]  ezetimibe (ZETIA) 10 MG tablet Take 10 mg by mouth daily.    [provider]  finasteride (PROSCAR) 5 MG tablet TAKE 1 TABLET BY MOUTH EVERY DAY 10/05/18   [provider]  furosemide (LASIX) 40 MG tablet Take 40 mg by mouth 2 (two) times daily. 08/11/18   [provider]  Glucosamine-Chondroitin 500-400 MG CAPS Take by mouth.    [provider]  insulin glargine (LANTUS) 100 UNIT/ML injection Inject 0.5 mLs (50 Units total) into the skin daily. 12/04/14   Love, Ivan Anchors, PA-C  losartan-hydrochlorothiazide (HYZAAR) 50-12.5 MG per tablet Take 1 tablet by mouth daily.    [provider]  meloxicam (MOBIC) 7.5 MG tablet Take 7.5 mg by mouth daily.    [provider]  metFORMIN (GLUCOPHAGE-XR) 500 MG 24 hr tablet Take 500 mg with breakfast and 1000 mg with supper Patient taking differently: Take 500 mg by mouth daily with breakfast. Take 500 mg with breakfast and 1000 mg with supper 12/04/14   Love, Ivan Anchors, PA-C  Multiple Vitamins-Minerals (MULTIVITAMINS THER. W/MINERALS) TABS tablet Take by mouth.    [provider]  pantoprazole (PROTONIX) 40 MG tablet Take 1 tablet (40 mg total) by mouth daily. 12/04/14   Love, Ivan Anchors, PA-C  polyethylene glycol (MIRALAX / GLYCOLAX) packet Take by mouth. 07/29/15   [provider]  tamsulosin (FLOMAX) 0.4 MG CAPS capsule Take 0.4 mg by mouth daily.  09/02/13   [provider]  TRESIBA FLEXTOUCH 200 UNIT/ML SOPN INJECT 100UNITS SUBCUTANEOUSLY EVERY MORNING 07/20/18   [provider]    Family History No family history on file.  Social History Social History   Tobacco Use   Smoking status: Former Smoker    Types: Cigarettes    Last attempt to quit: 07/28/1974    Years since quitting: 44.6   Smokeless tobacco: Never Used  Substance Use Topics   Alcohol use: No   Drug use: No     Allergies   Pantoprazole and Protonix [pantoprazole sodium]   Review of Systems Review of Systems  Constitutional: Positive for fatigue. Negative for chills and fever.  HENT: Negative for congestion.   Respiratory: Negative for shortness of breath.   Cardiovascular: Negative for chest pain.  Gastrointestinal: Negative for abdominal pain and vomiting.  Genitourinary: Negative for dysuria and flank pain.  Musculoskeletal: Negative for back pain, neck pain and neck stiffness.  Skin: Negative for rash.  Neurological: Positive for speech difficulty and weakness. Negative for light-headedness and headaches.     Physical Exam Updated Vital Signs BP 103/72    Pulse 94    Temp 98.4 F (36.9 C) (Oral)     Resp 20    SpO2 95%   Physical Exam Vitals signs and nursing note reviewed.  Constitutional:      Appearance: He is well-developed.  HENT:     Head: Normocephalic and atraumatic.     Comments: Dry mm Eyes:     General:        Right eye: No discharge.        Left eye: No discharge.  Neck:     Musculoskeletal: Normal range of motion and neck supple. No neck rigidity.     Trachea: No tracheal deviation.  Cardiovascular:     Rate and Rhythm: Normal rate.     Heart sounds: Murmur (SM upper sternum) present.  Pulmonary:     Effort: Pulmonary effort is normal.     Breath sounds: Normal breath sounds.  Abdominal:     General: There is no distension.  Palpations: Abdomen is soft.     Tenderness: There is no abdominal tenderness. There is no guarding.  Musculoskeletal:        General: Signs of injury present. No deformity.     Comments: Patient has minimal discomfort with flexion of the right hip no focal tenderness, no discomfort with left hip.  Patient has no discomfort with palpation of bilateral ankles or feet however he does have dried blood and mild avulsion second digit left foot.  Patient has no midline spine tenderness neck supple full range of motion.   Skin:    General: Skin is warm.     Coloration: Skin is not pale.  Neurological:     Mental Status: He is alert.     Cranial Nerves: No cranial nerve deficit.      ED Treatments / Results  Labs (all labs ordered are listed, but only abnormal results are displayed) Labs Reviewed  CBC WITH DIFFERENTIAL/PLATELET - Abnormal; Notable for the following components:      Result Value   RBC 3.96 (*)    Hemoglobin 12.7 (*)    HCT 36.7 (*)    All other components within normal limits  COMPREHENSIVE METABOLIC PANEL - Abnormal; Notable for the following components:   Sodium 132 (*)    Potassium 3.3 (*)    Chloride 97 (*)    Glucose, Bld 249 (*)    BUN 28 (*)    Creatinine, Ser 1.25 (*)    Calcium 8.8 (*)    Total Protein  6.2 (*)    Albumin 3.3 (*)    Total Bilirubin 1.3 (*)    GFR calc non Af Amer 50 (*)    GFR calc Af Amer 58 (*)    All other components within normal limits  URINALYSIS, ROUTINE W REFLEX MICROSCOPIC - Abnormal; Notable for the following components:   Glucose, UA 150 (*)    Hgb urine dipstick SMALL (*)    All other components within normal limits  TROPONIN I - Abnormal; Notable for the following components:   Troponin I 0.08 (*)    All other components within normal limits  CBG MONITORING, ED - Abnormal; Notable for the following components:   Glucose-Capillary 245 (*)    All other components within normal limits  URINE CULTURE  CK    EKG None  Radiology Dg Chest 2 View  Result Date: 03/17/2019 CLINICAL DATA:  Weakness EXAM: CHEST - 2 VIEW COMPARISON:  02/03/2018, 11/18/2014 FINDINGS: Ankylosis of the spine. No pleural effusion. Stable nodule in the right mid to upper lung. No acute consolidation. Stable cardiomediastinal silhouette with aortic atherosclerosis. No pneumothorax. IMPRESSION: No active cardiopulmonary disease. Electronically Signed   By: Donavan Foil M.D.   On: 03/17/2019 22:46   Dg Pelvis 1-2 Views  Result Date: 03/17/2019 CLINICAL DATA:  Weakness, fall EXAM: PELVIS - 1-2 VIEW COMPARISON:  None. FINDINGS: SI joints are non widened. Pubic symphysis and rami are intact. No fracture or malalignment. IMPRESSION: No acute osseous abnormality. Electronically Signed   By: Donavan Foil M.D.   On: 03/17/2019 22:47   Ct Head Wo Contrast  Result Date: 03/17/2019 CLINICAL DATA:  Head trauma EXAM: CT HEAD WITHOUT CONTRAST TECHNIQUE: Contiguous axial images were obtained from the base of the skull through the vertex without intravenous contrast. COMPARISON:  MRI 11/19/2014, CT brain 11/18/2014 FINDINGS: Brain: No acute territorial infarction, hemorrhage or intracranial mass. Marked atrophy. Small vessel ischemic changes of the white matter. Chronic lacunar infarct within the  right  basal ganglia. Prominent ventricles, felt secondary to atrophy Vascular: No hyperdense vessels.  Carotid vascular calcification Skull: Normal. Negative for fracture or focal lesion. Sinuses/Orbits: No acute finding. Other: None IMPRESSION: 1. No CT evidence for acute intracranial abnormality. 2. Atrophy and mild small vessel ischemic changes of the white matter Electronically Signed   By: Donavan Foil M.D.   On: 03/17/2019 22:45   Dg Foot 2 Views Left  Result Date: 03/17/2019 CLINICAL DATA:  Fall EXAM: LEFT FOOT - 2 VIEW COMPARISON:  09/26/2015 FINDINGS: Bones appear osteopenic. No acute displaced fracture or malalignment. Mild joint space narrowing at the first MTP joint. Mild narrowing at the PIP and DIP joints. IMPRESSION: No acute osseous abnormality Electronically Signed   By: Donavan Foil M.D.   On: 03/17/2019 22:49    Procedures Procedures (including critical care time)  Medications Ordered in ED Medications  0.9 %  sodium chloride infusion (has no administration in time range)  sodium chloride 0.9 % bolus 500 mL (0 mLs Intravenous Stopped 03/17/19 2253)     Initial Impression / Assessment and Plan / ED Course  I have reviewed the triage vital signs and the nursing notes.  Pertinent labs & imaging results that were available during my care of the patient were reviewed by me and considered in my medical decision making (see chart for details).       Patient presents with general weakness and strong scent of urine on arrival.  Vitals reassuring borderline heart rate.  Plan for blood work check for anemia, metabolic abnormalities, urinalysis or urine infection, chest x-ray to look for signs of pneumonia or rib fracture from fall.  No signs of significant injury from the fall exam.  Plan for CT scan of the head with vague details of this fall and possible stroke symptoms that have resolved.  Likely plan for observation in the hospital for TIA.  Patient's blood work returned acute renal  failure, mild low potassium, mild low sodium.  IV fluid bolus given.  CK added later on.  X-rays no acute fracture.  CT scan no bleeding or large stroke.  Patient deconditioned and daughter gave more details how he has been getting worse the past 2 weeks with more falls and general weakness.  Patient needs further work-up inpatient and possible placement. Urinalysis no sign of infection mild blood.  Discussed with hospitalist for admission discussed with neurologist for consult.  The patients results and plan were reviewed and discussed.   Any x-rays performed were independently reviewed by myself.   Differential diagnosis were considered with the presenting HPI.  Medications  0.9 %  sodium chloride infusion (has no administration in time range)  sodium chloride 0.9 % bolus 500 mL (0 mLs Intravenous Stopped 03/17/19 2253)    Vitals:   03/17/19 2130 03/17/19 2200 03/17/19 2245 03/17/19 2300  BP: 118/71 128/80 124/78 103/72  Pulse: 96 95 96 94  Resp: (!) 25 (!) 22 18 20   Temp:      TempSrc:      SpO2: 95% 94% 94% 95%    Final diagnoses:  TIA (transient ischemic attack)  Acute renal failure, unspecified acute renal failure type (HCC)  Hypokalemia  Troponin level elevated    Admission/ observation were discussed with the admitting physician, patient and/or family and they are comfortable with the plan.    Final Clinical Impressions(s) / ED Diagnoses   Final diagnoses:  TIA (transient ischemic attack)  Acute renal failure, unspecified acute renal failure type (  [place])  Hypokalemia  Troponin level elevated    ED Discharge Orders    None       Elnora Morrison, MD 03/17/19 2318

## 2019-03-17 NOTE — H&P (Addendum)
History and Physical    Chad Fuller AST:[id number] DOB: April 08, 1926 DOA: 03/17/2019  Referring MD/NP/PA:   PCP: Lajean Manes, MD   Patient coming from:  The patient is coming from home.  At baseline, pt is partially dependent for most of ADL.        Chief Complaint: Left leg weakness, difficulty speaking, worsening left facial droop, fall, generalized weakness, fall  HPI: Chad Fuller is a 85 y.o. male with medical history significant of stroke, diabetes mellitus, COPD, GERD, CAD, stent placement, lung cancer in remission (partial lumpectomy), BPH, remote PE, dCHF, GI bleeding, hard of hearing, who presents with left leg weakness, difficulty speaking, worsening left facial droop, fall, generalized weakness.  Per daughter, patient has been not doing well in the past 2 weeks, he has generalized weakness, and fell 7 times without significant injury.  No syncope or passing out.  Patient seems to have left leg weakness and difficulty speaking.  Patient has chronic left facial droop, which seem to have worsened per her daughter.  Patient has chronic hearing loss, but no vision change.  Patient denies any chest pain, shortness breath, cough.  He has nausea, no vomiting, diarrhea, abdominal pain, symptoms of UTI.  No fever or chills.   ED Course: pt was found to have WBC 6.0, troponin 0.08, negative urinalysis, CK 150, AKI with creatinine 1.25 and BUN 28, temperature normal, heart rate in 90s, oxygen saturation 94% on room air.  Chest x-ray negative.  CT head is negative for acute intracranial abnormalities.  X-ray of left foot and left pelvis negative for bony fracture.  Patient is placed on telemetry bed for observation.  Neurology, Dr. Lorraine Lax was consulted by EDP.  Review of Systems:   General: no fevers, chills, no body weight gain, has poor appetite, has fatigue HEENT: no blurry vision, sore throat. Has HOH. Respiratory: no dyspnea, coughing, wheezing CV: no chest pain, no palpitations  GI: has nausea, no vomiting, abdominal pain, diarrhea, constipation GU: no dysuria, burning on urination, increased urinary frequency, hematuria  Ext: no leg edema Neuro:  Left leg weakness, difficulty speaking, worsening left facial droop, fall Skin: no rash. Has left second toe nail injury. MSK: No muscle spasm, no deformity, no limitation of range of movement in spin Heme: No easy bruising.  Travel history: No recent long distant travel.  Allergy:  Allergies  Allergen Reactions  . Pantoprazole Diarrhea  . Protonix [Pantoprazole Sodium] Diarrhea    Past Medical History:  Diagnosis Date  . CAD (coronary artery disease)   . Cancer ([place])    lung  . Diabetes mellitus   . History of blood clots   . Prostate enlargement   . Pulmonary embolus [facility]) july 2005    Past Surgical History:  Procedure Laterality Date  . COLONOSCOPY N/A 07/24/2015   Procedure: COLONOSCOPY;  Surgeon: Laurence Spates, MD;  Location: [facility] ENDOSCOPY;  Service: Endoscopy;  Laterality: N/A;  . CORONARY STENT PLACEMENT    . ESOPHAGOGASTRODUODENOSCOPY N/A 07/24/2015   Procedure: ESOPHAGOGASTRODUODENOSCOPY (EGD);  Surgeon: Laurence Spates, MD;  Location: [facility] ENDOSCOPY;  Service: Endoscopy;  Laterality: N/A;  bleeding source not in colon; EGD performed to locate source  . FILTERING PROCEDURE     reports filter placed after knee surgery for blood clots  . I&D EXTREMITY  08/16/2012   Procedure: MINOR IRRIGATION AND DEBRIDEMENT EXTREMITY;  Surgeon: Tennis Must, MD;  Location: [place];  Service: Orthopedics;  Laterality: Right;  . KNEE SURGERY    .  LUNG REMOVAL, PARTIAL      Social History:  reports that he quit smoking about 44 years ago. His smoking use included cigarettes. He has never used smokeless tobacco. He reports that he does not drink alcohol or use drugs.  Family History:  Family History  Problem Relation Age of Onset  . Diabetes Mellitus II Sister      Prior to Admission medications    Medication Sig Start Date End Date Taking? Authorizing Provider  ACCU-CHEK AVIVA PLUS test strip AS DIRECTED DX E11.65 THREE TIMES A DAY 90 DAYS 08/15/18   [provider]  alfuzosin (UROXATRAL) 10 MG 24 hr tablet  10/07/18   [provider]  aspirin 81 MG tablet Take 81 mg by mouth daily.    [provider]  B-D ULTRAFINE III SHORT PEN 31G X 8 MM MISC USE ONCE A DAY WITH TRESIBA FLEXPEN 07/22/18   [provider]  ezetimibe (ZETIA) 10 MG tablet Take 10 mg by mouth daily.    [provider]  finasteride (PROSCAR) 5 MG tablet TAKE 1 TABLET BY MOUTH EVERY DAY 10/05/18   [provider]  furosemide (LASIX) 40 MG tablet Take 40 mg by mouth 2 (two) times daily. 08/11/18   [provider]  Glucosamine-Chondroitin 500-400 MG CAPS Take by mouth.    [provider]  insulin glargine (LANTUS) 100 UNIT/ML injection Inject 0.5 mLs (50 Units total) into the skin daily. 12/04/14   Love, Ivan Anchors, PA-C  losartan-hydrochlorothiazide (HYZAAR) 50-12.5 MG per tablet Take 1 tablet by mouth daily.    [provider]  meloxicam (MOBIC) 7.5 MG tablet Take 7.5 mg by mouth daily.    [provider]  metFORMIN (GLUCOPHAGE-XR) 500 MG 24 hr tablet Take 500 mg with breakfast and 1000 mg with supper Patient taking differently: Take 500 mg by mouth daily with breakfast. Take 500 mg with breakfast and 1000 mg with supper 12/04/14   Love, Ivan Anchors, PA-C  Multiple Vitamins-Minerals (MULTIVITAMINS THER. W/MINERALS) TABS tablet Take by mouth.    [provider]  pantoprazole (PROTONIX) 40 MG tablet Take 1 tablet (40 mg total) by mouth daily. 12/04/14   Love, Ivan Anchors, PA-C  polyethylene glycol (MIRALAX / GLYCOLAX) packet Take by mouth. 07/29/15   [provider]  tamsulosin (FLOMAX) 0.4 MG CAPS capsule Take 0.4 mg by mouth daily.  09/02/13   [provider]  TRESIBA FLEXTOUCH 200 UNIT/ML SOPN INJECT 100UNITS SUBCUTANEOUSLY EVERY  MORNING 07/20/18   [provider]    Physical Exam: Vitals:   03/18/19 0000 03/18/19 0015 03/18/19 0030 03/18/19 0140  BP: 109/65 115/67 110/74 140/77  Pulse: 95 95 95 (!) 109  Resp: (!) 22   (!) 22  Temp:    98.2 F (36.8 C)  TempSrc:    Oral  SpO2: 96% 95% 96% 94%   General: Not in acute distress HEENT:       Eyes: PERRL, EOMI, no scleral icterus.       ENT: No discharge from the ears and nose, no pharynx injection, no tonsillar enlargement.        Neck: No JVD, no bruit, no mass felt. Heme: No neck lymph node enlargement. Cardiac: S1/S2, RRR, 1/6 murmurs, No gallops or rubs. Respiratory: No rales, wheezing, rhonchi or rubs. GI: Soft, nondistended, nontender, no rebound pain, no organomegaly, BS present. GU: No hematuria Ext: No pitting leg edema bilaterally. 2+DP/PT pulse bilaterally. Musculoskeletal: No joint deformities, No joint redness or warmth, no limitation  of ROM in spin. Skin: No rashes. Has left second toe nail injury with little dry blood. Neuro: Alert, oriented X3, cranial nerves II-XII grossly intact except of left facial droop and HOH. Muscle strength 4/5 in both legs and 5/5 in both arms. Sensation to light touch intact. Brachial reflex 2+ bilaterally. Negative Babinski's sign. Psych: Patient is not psychotic, no suicidal or hemocidal ideation.  Labs on Admission: I have personally reviewed following labs and imaging studies  CBC: Recent Labs  Lab 03/17/19 2025  WBC 6.0  NEUTROABS 4.2  HGB 12.7*  HCT 36.7*  MCV 92.7  PLT PLATELET CLUMPS NOTED ON SMEAR, UNABLE TO ESTIMATE   Basic Metabolic Panel: Recent Labs  Lab 03/17/19 2025  NA 132*  K 3.3*  CL 97*  CO2 26  GLUCOSE 249*  BUN 28*  CREATININE 1.25*  CALCIUM 8.8*   GFR: CrCl cannot be calculated (Unknown ideal weight.). Liver Function Tests: Recent Labs  Lab 03/17/19 2025  AST 27  ALT 14  ALKPHOS 93  BILITOT 1.3*  PROT 6.2*  ALBUMIN 3.3*   No results for input(s): LIPASE,  AMYLASE in the last 168 hours. No results for input(s): AMMONIA in the last 168 hours. Coagulation Profile: No results for input(s): INR, PROTIME in the last 168 hours. Cardiac Enzymes: Recent Labs  Lab 03/17/19 2042 03/17/19 2316 03/18/19 0041  CKTOTAL  --  150  --   TROPONINI 0.08*  --  0.10*   BNP (last 3 results) No results for input(s): PROBNP in the last 8760 hours. HbA1C: No results for input(s): HGBA1C in the last 72 hours. CBG: Recent Labs  Lab 03/17/19 2033 03/18/19 0147  GLUCAP 245* 212*   Lipid Profile: No results for input(s): CHOL, HDL, LDLCALC, TRIG, CHOLHDL, LDLDIRECT in the last 72 hours. Thyroid Function Tests: No results for input(s): TSH, T4TOTAL, FREET4, T3FREE, THYROIDAB in the last 72 hours. Anemia Panel: No results for input(s): VITAMINB12, FOLATE, FERRITIN, TIBC, IRON, RETICCTPCT in the last 72 hours. Urine analysis:    Component Value Date/Time   COLORURINE YELLOW 03/17/2019 2026   APPEARANCEUR CLEAR 03/17/2019 2026   LABSPEC 1.015 03/17/2019 2026   PHURINE 5.0 03/17/2019 2026   GLUCOSEU 150 (A) 03/17/2019 2026   HGBUR SMALL (A) 03/17/2019 2026   BILIRUBINUR NEGATIVE 03/17/2019 2026   KETONESUR NEGATIVE 03/17/2019 2026   PROTEINUR NEGATIVE 03/17/2019 2026   UROBILINOGEN 1.0 11/24/2014 1506   NITRITE NEGATIVE 03/17/2019 2026   LEUKOCYTESUR NEGATIVE 03/17/2019 2026   Sepsis Labs: @LABRCNTIP (procalcitonin:4,lacticidven:4) )No results found for this or any previous visit (from the past 240 hour(s)).   Radiological Exams on Admission: Dg Chest 2 View  Result Date: 03/17/2019 CLINICAL DATA:  Weakness EXAM: CHEST - 2 VIEW COMPARISON:  02/03/2018, 11/18/2014 FINDINGS: Ankylosis of the spine. No pleural effusion. Stable nodule in the right mid to upper lung. No acute consolidation. Stable cardiomediastinal silhouette with aortic atherosclerosis. No pneumothorax. IMPRESSION: No active cardiopulmonary disease. Electronically Signed   By: Donavan Foil M.D.   On: 03/17/2019 22:46   Dg Pelvis 1-2 Views  Result Date: 03/17/2019 CLINICAL DATA:  Weakness, fall EXAM: PELVIS - 1-2 VIEW COMPARISON:  None. FINDINGS: SI joints are non widened. Pubic symphysis and rami are intact. No fracture or malalignment. IMPRESSION: No acute osseous abnormality. Electronically Signed   By: Donavan Foil M.D.   On: 03/17/2019 22:47   Ct Head Wo Contrast  Result Date: 03/17/2019 CLINICAL DATA:  Head trauma EXAM: CT HEAD WITHOUT CONTRAST TECHNIQUE: Contiguous axial images  were obtained from the base of the skull through the vertex without intravenous contrast. COMPARISON:  MRI 11/19/2014, CT brain 11/18/2014 FINDINGS: Brain: No acute territorial infarction, hemorrhage or intracranial mass. Marked atrophy. Small vessel ischemic changes of the white matter. Chronic lacunar infarct within the right basal ganglia. Prominent ventricles, felt secondary to atrophy Vascular: No hyperdense vessels.  Carotid vascular calcification Skull: Normal. Negative for fracture or focal lesion. Sinuses/Orbits: No acute finding. Other: None IMPRESSION: 1. No CT evidence for acute intracranial abnormality. 2. Atrophy and mild small vessel ischemic changes of the white matter Electronically Signed   By: Donavan Foil M.D.   On: 03/17/2019 22:45   Dg Foot 2 Views Left  Result Date: 03/17/2019 CLINICAL DATA:  Fall EXAM: LEFT FOOT - 2 VIEW COMPARISON:  09/26/2015 FINDINGS: Bones appear osteopenic. No acute displaced fracture or malalignment. Mild joint space narrowing at the first MTP joint. Mild narrowing at the PIP and DIP joints. IMPRESSION: No acute osseous abnormality Electronically Signed   By: Donavan Foil M.D.   On: 03/17/2019 22:49     EKG: Independently reviewed.  Sinus rhythm, QTC 473, low voltage, Q waves in inferior leads, nonspecific T wave change.  Assessment/Plan Principal Problem:   Left leg weakness Active Problems:   CAD (coronary artery disease)   Stroke  (cerebrum) (HCC)   Diabetes mellitus without complication (HCC)   BPH (benign prostatic hyperplasia)   Chronic diastolic (congestive) heart failure (HCC)   Fall   Hypokalemia   AKI (acute kidney injury) ([place])   Elevated troponin   HTN (hypertension)   HLD (hyperlipidemia)   Left leg weakness: pt has left leg weakness, difficulty speaking and worsening left facial droop.  CT head is negative for acute intracranial abnormalities.  On physical examination, patient has weakness in both legs, but seems to be symmetric weakness.  He does have left facial droop.  Etiology is not clear.  Potential differential differential diagnosis is TIA versus stroke.  Neurology, Dr. Lorraine Lax was consulted by EDP.  - will place on tele bed for obs - will get MRI/MRI - 2d echo and carotid doppler - Continue ASA and zetia - fasting lipid panel and HbA1c  - swallowing screen. If fails, will get SLP - PT/OT consult - consult to CM and SW for possible rehab placement - f/u neurology's recommendation  Hx of stroke: -see above  Hx of CAD and elevated trop: s/p of stent 2016. Trop 0.08, but no CP or SOB.  Possibly due to demand ischemia. - prn Nitroglycerin, and aspirin, Zetia - Risk factor stratification: will check FLP and A1C  - inpt card consult was requested via Epic  Diabetes mellitus without complication ([place]): Last A1c 7.9 on 11/19/14, poorly controled. Patient is taking [place], metformin, Lantus at home -will decrease Lantus dose from 50 to 30 unit daily -SSI  GERD (gastroesophageal reflux disease): -Protonix  BPH: stable - Continue Uroxatral and Proscar  Chronic diastolic (congestive) heart failure ([place]): 2D echo on 06/17/2018 showed EF 60-85% with grade 1 diastolic dysfunction.  Patient does not have leg edema or JVD.  CHF seem to be compensated. -Hold Lasix due to AKI -BNP in AM  Fall: no significant injury except for left second toenail injury.  CT head negative.  X-ray of left foot and  left pelvis negative. -PT/OT  Hypokalemia: K=3.3 on admission. - Repleted - Check Mg level  AKI: Likely due to dehydration and continuation of ARB, diuretics, NSAIDs - IVF: 500 cc of NS, then 75  cc/h  - Follow up renal function by BMP - Hold [place] and lasix, and Mobic  HTN:  -Hold Hyzarr and lasix due to AKI and allow permissive hypertension -pt is on Alfuzosin which is for BPh -IV hydralazine prn  HLD: -Zetia    DVT ppx: SCD Code Status:  DNR (I discussed with patient in the presence of her daughter, and explained the meaning of CODE STATUS. Patient wants to be DNR) Family Communication:   Yes, patient's  daughter  at bed side Disposition Plan:  To be determined Consults called: Neurology, Dr. Lorraine Lax Admission status: Obs / tele     Date of Service 03/18/2019    [facility]   If 7PM-7AM, please contact night-coverage [email or web address] Password [email or web address] 03/18/2019, 2:17 AM

## 2019-03-18 ENCOUNTER — Observation Stay (HOSPITAL_COMMUNITY): Payer: Medicare Other

## 2019-03-18 ENCOUNTER — Other Ambulatory Visit (HOSPITAL_COMMUNITY): Payer: Medicare Other

## 2019-03-18 ENCOUNTER — Encounter (HOSPITAL_COMMUNITY): Payer: Self-pay | Admitting: Internal Medicine

## 2019-03-18 DIAGNOSIS — I5032 Chronic diastolic (congestive) heart failure: Secondary | ICD-10-CM | POA: Diagnosis not present

## 2019-03-18 DIAGNOSIS — L8990 Pressure ulcer of unspecified site, unspecified stage: Secondary | ICD-10-CM

## 2019-03-18 DIAGNOSIS — E785 Hyperlipidemia, unspecified: Secondary | ICD-10-CM | POA: Diagnosis present

## 2019-03-18 DIAGNOSIS — N179 Acute kidney failure, unspecified: Secondary | ICD-10-CM | POA: Diagnosis not present

## 2019-03-18 DIAGNOSIS — I6502 Occlusion and stenosis of left vertebral artery: Secondary | ICD-10-CM | POA: Diagnosis not present

## 2019-03-18 DIAGNOSIS — I2510 Atherosclerotic heart disease of native coronary artery without angina pectoris: Secondary | ICD-10-CM | POA: Diagnosis not present

## 2019-03-18 DIAGNOSIS — R29898 Other symptoms and signs involving the musculoskeletal system: Secondary | ICD-10-CM

## 2019-03-18 DIAGNOSIS — I10 Essential (primary) hypertension: Secondary | ICD-10-CM | POA: Diagnosis present

## 2019-03-18 DIAGNOSIS — Z8673 Personal history of transient ischemic attack (TIA), and cerebral infarction without residual deficits: Secondary | ICD-10-CM

## 2019-03-18 DIAGNOSIS — E119 Type 2 diabetes mellitus without complications: Secondary | ICD-10-CM | POA: Diagnosis not present

## 2019-03-18 DIAGNOSIS — I6340 Cerebral infarction due to embolism of unspecified cerebral artery: Secondary | ICD-10-CM

## 2019-03-18 LAB — HEMOGLOBIN A1C
Hgb A1c MFr Bld: 9.7 % — ABNORMAL HIGH (ref 4.8–5.6)
Mean Plasma Glucose: 231.69 mg/dL

## 2019-03-18 LAB — LIPID PANEL
Cholesterol: 107 mg/dL (ref 0–200)
HDL: 33 mg/dL — ABNORMAL LOW (ref >40)
LDL Cholesterol: 61 mg/dL (ref 0–99)
TRIGLYCERIDES: 67 mg/dL (ref <150)
Total CHOL/HDL Ratio: 3.2 RATIO
VLDL: 13 mg/dL (ref 0–40)

## 2019-03-18 LAB — BRAIN NATRIURETIC PEPTIDE: B NATRIURETIC PEPTIDE 5: 46.8 pg/mL (ref 0.0–100.0)

## 2019-03-18 LAB — GLUCOSE, CAPILLARY
Glucose-Capillary: 212 mg/dL — ABNORMAL HIGH (ref 70–99)
Glucose-Capillary: 183 mg/dL — ABNORMAL HIGH (ref 70–99)
Glucose-Capillary: 230 mg/dL — ABNORMAL HIGH (ref 70–99)
Glucose-Capillary: 141 mg/dL — ABNORMAL HIGH (ref 70–99)
Glucose-Capillary: 141 mg/dL — ABNORMAL HIGH (ref 70–99)

## 2019-03-18 LAB — TROPONIN I
TROPONIN I: 0.1 ng/mL — AB (ref <0.03)
Troponin I: 0.15 ng/mL (ref <0.03)
Troponin I: 0.13 ng/mL (ref <0.03)

## 2019-03-18 LAB — MAGNESIUM: Magnesium: 1.8 mg/dL (ref 1.7–2.4)

## 2019-03-18 MED ORDER — POLYETHYLENE GLYCOL 3350 17 G PO PACK
17.0000 g | PACK | Freq: Every day | ORAL | Status: DC | PRN
  Administered 2019-03-20: 17 g via ORAL
  Filled 2019-03-18: qty 1

## 2019-03-18 MED ORDER — ALFUZOSIN HCL ER 10 MG PO TB24
10.0000 mg | ORAL_TABLET | Freq: Every day | ORAL | Status: DC
  Administered 2019-03-18 – 2019-03-21 (×4): 10 mg via ORAL
  Filled 2019-03-18 (×4): qty 1

## 2019-03-18 MED ORDER — HYDRALAZINE HCL 20 MG/ML IJ SOLN: 5.0000 mg | INTRAMUSCULAR | Status: DC | PRN

## 2019-03-18 MED ORDER — ADULT MULTIVITAMIN W/MINERALS CH
1.0000 | ORAL_TABLET | Freq: Every day | ORAL | Status: DC
  Administered 2019-03-18 – 2019-03-21 (×4): 1 via ORAL
  Filled 2019-03-18 (×4): qty 1

## 2019-03-18 MED ORDER — ACETAMINOPHEN 160 MG/5ML PO SOLN: 650.0000 mg | ORAL | Status: DC | PRN

## 2019-03-18 MED ORDER — GADOBUTROL 1 MMOL/ML IV SOLN
9.0000 mL | Freq: Once | INTRAVENOUS | Status: AC | PRN
  Administered 2019-03-18: 9 mL via INTRAVENOUS

## 2019-03-18 MED ORDER — ONDANSETRON HCL 4 MG/2ML IJ SOLN: 4.0000 mg | Freq: Three times a day (TID) | INTRAMUSCULAR | Status: DC | PRN

## 2019-03-18 MED ORDER — ASPIRIN 300 MG RE SUPP: 300.0000 mg | Freq: Every day | RECTAL | Status: DC

## 2019-03-18 MED ORDER — POTASSIUM CHLORIDE 20 MEQ/15ML (10%) PO SOLN: 20.0000 meq | Freq: Once | ORAL | Status: DC

## 2019-03-18 MED ORDER — POTASSIUM CHLORIDE 10 MEQ/100ML IV SOLN
10.0000 meq | INTRAVENOUS | Status: AC
  Administered 2019-03-18 (×2): 10 meq via INTRAVENOUS
  Filled 2019-03-18 (×2): qty 100

## 2019-03-18 MED ORDER — NITROGLYCERIN 0.4 MG SL SUBL: 0.4000 mg | SUBLINGUAL_TABLET | SUBLINGUAL | Status: DC | PRN

## 2019-03-18 MED ORDER — ACETAMINOPHEN 650 MG RE SUPP: 650.0000 mg | RECTAL | Status: DC | PRN

## 2019-03-18 MED ORDER — EZETIMIBE 10 MG PO TABS
10.0000 mg | ORAL_TABLET | Freq: Every day | ORAL | Status: DC
  Administered 2019-03-18 – 2019-03-21 (×4): 10 mg via ORAL
  Filled 2019-03-18 (×4): qty 1

## 2019-03-18 MED ORDER — STROKE: EARLY STAGES OF RECOVERY BOOK
Freq: Once | Status: AC
  Administered 2019-03-18: 06:00:00

## 2019-03-18 MED ORDER — ASPIRIN 325 MG PO TABS
325.0000 mg | ORAL_TABLET | Freq: Every day | ORAL | Status: DC
  Administered 2019-03-18 – 2019-03-21 (×4): 325 mg via ORAL
  Filled 2019-03-18 (×4): qty 1

## 2019-03-18 MED ORDER — INSULIN ASPART 100 UNIT/ML ~~LOC~~ SOLN
0.0000 [IU] | Freq: Three times a day (TID) | SUBCUTANEOUS | Status: DC
  Administered 2019-03-18: 2 [IU] via SUBCUTANEOUS
  Administered 2019-03-18: 3 [IU] via SUBCUTANEOUS
  Administered 2019-03-18: 1 [IU] via SUBCUTANEOUS
  Administered 2019-03-19: 3 [IU] via SUBCUTANEOUS
  Administered 2019-03-19 (×2): 2 [IU] via SUBCUTANEOUS
  Administered 2019-03-20: 1 [IU] via SUBCUTANEOUS
  Administered 2019-03-20: 3 [IU] via SUBCUTANEOUS
  Administered 2019-03-20: 2 [IU] via SUBCUTANEOUS

## 2019-03-18 MED ORDER — INSULIN GLARGINE 100 UNIT/ML ~~LOC~~ SOLN
30.0000 [IU] | Freq: Every day | SUBCUTANEOUS | Status: DC
  Administered 2019-03-18 – 2019-03-21 (×4): 30 [IU] via SUBCUTANEOUS
  Filled 2019-03-18 (×4): qty 0.3

## 2019-03-18 MED ORDER — FINASTERIDE 5 MG PO TABS
5.0000 mg | ORAL_TABLET | Freq: Every day | ORAL | Status: DC
  Administered 2019-03-18 – 2019-03-21 (×4): 5 mg via ORAL
  Filled 2019-03-18 (×4): qty 1

## 2019-03-18 MED ORDER — SENNOSIDES-DOCUSATE SODIUM 8.6-50 MG PO TABS: 1.0000 | ORAL_TABLET | Freq: Every evening | ORAL | Status: DC | PRN

## 2019-03-18 MED ORDER — SODIUM CHLORIDE 0.9 % IV SOLN
INTRAVENOUS | Status: DC
  Administered 2019-03-17 – 2019-03-20 (×3): via INTRAVENOUS

## 2019-03-18 MED ORDER — ACETAMINOPHEN 325 MG PO TABS
650.0000 mg | ORAL_TABLET | ORAL | Status: DC | PRN
  Administered 2019-03-18: 650 mg via ORAL
  Filled 2019-03-18: qty 2

## 2019-03-18 MED ORDER — ASPIRIN 81 MG PO CHEW: 81.0000 mg | CHEWABLE_TABLET | Freq: Every day | ORAL | Status: DC

## 2019-03-18 MED ORDER — HYDRALAZINE HCL 20 MG/ML IJ SOLN: 5.0000 mg | Freq: Four times a day (QID) | INTRAMUSCULAR | Status: DC | PRN

## 2019-03-18 NOTE — ED Notes (Signed)
ED TO INPATIENT HANDOFF REPORT  ED Nurse Name and Phone #:  Chong Sicilian, RN [phone]  S Name/Age/Gender Henderson Newcomer 85 y.o. male Room/Bed: 045C/045C  Code Status   Code Status: DNR  Home/SNF/Other Home Patient oriented to: self, place, time and situation Is this baseline? Yes   Triage Complete: Triage complete  Chief Complaint generalized weakness   Triage Note Pt arrives via ems from home with reports of generalized weakness and fatigue. Per EMS, pt fell today at approx 0600 ( not on thinners ) and was naked and incontinent of urine on scene. Sometime later today pt developed LLE weakness and was aphasic. Pt family took pt to PCP. Symptoms resolved. Pt arrives with pants and socks drenching wet. Dried blood noted to 2nd digit on left foot. 103 ST, 118/72, RR 18, 92% RA, CBG 309.   Allergies Allergies  Allergen Reactions  . Pantoprazole Diarrhea  . Protonix [Pantoprazole Sodium] Diarrhea    Level of Care/Admitting Diagnosis ED Disposition    ED Disposition Condition [facility] Area: [place] [100100]  Level of Care: Telemetry Cardiac [103]  I expect the patient will be discharged within 24 hours: No (not a candidate for 5C-Observation unit)  Diagnosis: Left leg weakness [119147]  Admitting Physician: Ivor Costa [[id number]]  Attending Physician: Ivor Costa [[id number]]  PT Class (Do Not Modify): Observation [104]  PT Acc Code (Do Not Modify): Observation [10022]       B Medical/Surgery History Past Medical History:  Diagnosis Date  . CAD (coronary artery disease)   . Cancer ([place])    lung  . Diabetes mellitus   . History of blood clots   . Prostate enlargement   . Pulmonary embolus [facility]) july 2005   Past Surgical History:  Procedure Laterality Date  . COLONOSCOPY N/A 07/24/2015   Procedure: COLONOSCOPY;  Surgeon: Laurence Spates, MD;  Location: [facility] ENDOSCOPY;  Service: Endoscopy;  Laterality: N/A;  . CORONARY STENT PLACEMENT    .  ESOPHAGOGASTRODUODENOSCOPY N/A 07/24/2015   Procedure: ESOPHAGOGASTRODUODENOSCOPY (EGD);  Surgeon: Laurence Spates, MD;  Location: [facility] ENDOSCOPY;  Service: Endoscopy;  Laterality: N/A;  bleeding source not in colon; EGD performed to locate source  . FILTERING PROCEDURE     reports filter placed after knee surgery for blood clots  . I&D EXTREMITY  08/16/2012   Procedure: MINOR IRRIGATION AND DEBRIDEMENT EXTREMITY;  Surgeon: Tennis Must, MD;  Location: [place];  Service: Orthopedics;  Laterality: Right;  . KNEE SURGERY    . LUNG REMOVAL, PARTIAL       A IV Location/Drains/Wounds Patient Lines/Drains/Airways Status   Active Line/Drains/Airways    Name:   Placement date:   Placement time:   Site:   Days:   Peripheral IV 03/17/19 Left Antecubital   03/17/19    2026    Antecubital   1          Intake/Output Last 24 hours No intake or output data in the 24 hours ending 03/18/19 0046  Labs/Imaging Results for orders placed or performed during the hospital encounter of 03/17/19 (from the past 48 hour(s))  CBC with Differential     Status: Abnormal   Collection Time: 03/17/19  8:25 PM  Result Value Ref Range   WBC 6.0 4.0 - 10.5 K/uL   RBC 3.96 (L) 4.22 - 5.81 MIL/uL   Hemoglobin 12.7 (L) 13.0 - 17.0 g/dL   HCT 36.7 (L) 39.0 - 52.0 %   MCV 92.7  80.0 - 100.0 fL   MCH 32.1 26.0 - 34.0 pg   MCHC 34.6 30.0 - 36.0 g/dL   RDW 12.5 11.5 - 15.5 %   Platelets PLATELET CLUMPS NOTED ON SMEAR, UNABLE TO ESTIMATE 150 - 400 K/uL    Comment: Immature Platelet Fraction may be clinically indicated, consider ordering this additional test [id number]    nRBC 0.0 0.0 - 0.2 %   Neutrophils Relative % 70 %   Neutro Abs 4.2 1.7 - 7.7 K/uL   Lymphocytes Relative 20 %   Lymphs Abs 1.2 0.7 - 4.0 K/uL   Monocytes Relative 8 %   Monocytes Absolute 0.5 0.1 - 1.0 K/uL   Eosinophils Relative 1 %   Eosinophils Absolute 0.1 0.0 - 0.5 K/uL   Basophils Relative 0 %   Basophils Absolute 0.0 0.0 -  0.1 K/uL   Immature Granulocytes 1 %   Abs Immature Granulocytes 0.04 0.00 - 0.07 K/uL    Comment: Performed at [facility] [address], [place], [place] [address]  Comprehensive metabolic panel     Status: Abnormal   Collection Time: 03/17/19  8:25 PM  Result Value Ref Range   Sodium 132 (L) 135 - 145 mmol/L   Potassium 3.3 (L) 3.5 - 5.1 mmol/L   Chloride 97 (L) 98 - 111 mmol/L   CO2 26 22 - 32 mmol/L   Glucose, Bld 249 (H) 70 - 99 mg/dL   BUN 28 (H) 8 - 23 mg/dL   Creatinine, Ser 1.25 (H) 0.61 - 1.24 mg/dL   Calcium 8.8 (L) 8.9 - 10.3 mg/dL   Total Protein 6.2 (L) 6.5 - 8.1 g/dL   Albumin 3.3 (L) 3.5 - 5.0 g/dL   AST 27 15 - 41 U/L   ALT 14 0 - 44 U/L   Alkaline Phosphatase 93 38 - 126 U/L   Total Bilirubin 1.3 (H) 0.3 - 1.2 mg/dL   GFR calc non Af Amer 50 (L) >60 mL/min   GFR calc Af Amer 58 (L) >60 mL/min   Anion gap 9 5 - 15    Comment: Performed at [facility], [place] [address] [place], [place] [address]  Urinalysis, Routine w reflex microscopic     Status: Abnormal   Collection Time: 03/17/19  8:26 PM  Result Value Ref Range   Color, Urine YELLOW YELLOW   APPearance CLEAR CLEAR   Specific Gravity, Urine 1.015 1.005 - 1.030   pH 5.0 5.0 - 8.0   Glucose, UA 150 (A) NEGATIVE mg/dL   Hgb urine dipstick SMALL (A) NEGATIVE   Bilirubin Urine NEGATIVE NEGATIVE   Ketones, ur NEGATIVE NEGATIVE mg/dL   Protein, ur NEGATIVE NEGATIVE mg/dL   Nitrite NEGATIVE NEGATIVE   Leukocytes,Ua NEGATIVE NEGATIVE   RBC / HPF 6-10 0 - 5 RBC/hpf   WBC, UA 0-5 0 - 5 WBC/hpf   Bacteria, UA NONE SEEN NONE SEEN   Mucus PRESENT    Hyaline Casts, UA PRESENT     Comment: Performed at [facility] [address], [place], [place] [address]  CBG monitoring, ED     Status: Abnormal   Collection Time: 03/17/19  8:33 PM  Result Value Ref Range   Glucose-Capillary 245 (H) 70 - 99 mg/dL  Troponin I - ONCE - STAT     Status: Abnormal   Collection Time: 03/17/19  8:42 PM  Result  Value Ref Range   Troponin I 0.08 (HH) <0.03 ng/mL    Comment:  CRITICAL RESULT CALLED TO, READ BACK BY AND VERIFIED WITH: Eather Colas [id number] 03/17/2019 WBOND Performed at [facility], [place] [address], [place], [place] [address]   CK     Status: None   Collection Time: 03/17/19 11:16 PM  Result Value Ref Range   Total CK 150 49 - 397 U/L    Comment: Performed at [facility], [place] [address] [place], [place] [address]   Dg Chest 2 View  Result Date: 03/17/2019 CLINICAL DATA:  Weakness EXAM: CHEST - 2 VIEW COMPARISON:  02/03/2018, 11/18/2014 FINDINGS: Ankylosis of the spine. No pleural effusion. Stable nodule in the right mid to upper lung. No acute consolidation. Stable cardiomediastinal silhouette with aortic atherosclerosis. No pneumothorax. IMPRESSION: No active cardiopulmonary disease. Electronically Signed   By: Donavan Foil M.D.   On: 03/17/2019 22:46   Dg Pelvis 1-2 Views  Result Date: 03/17/2019 CLINICAL DATA:  Weakness, fall EXAM: PELVIS - 1-2 VIEW COMPARISON:  None. FINDINGS: SI joints are non widened. Pubic symphysis and rami are intact. No fracture or malalignment. IMPRESSION: No acute osseous abnormality. Electronically Signed   By: Donavan Foil M.D.   On: 03/17/2019 22:47   Ct Head Wo Contrast  Result Date: 03/17/2019 CLINICAL DATA:  Head trauma EXAM: CT HEAD WITHOUT CONTRAST TECHNIQUE: Contiguous axial images were obtained from the base of the skull through the vertex without intravenous contrast. COMPARISON:  MRI 11/19/2014, CT brain 11/18/2014 FINDINGS: Brain: No acute territorial infarction, hemorrhage or intracranial mass. Marked atrophy. Small vessel ischemic changes of the white matter. Chronic lacunar infarct within the right basal ganglia. Prominent ventricles, felt secondary to atrophy Vascular: No hyperdense vessels.  Carotid vascular calcification Skull: Normal. Negative for fracture or focal lesion. Sinuses/Orbits: No acute finding. Other: None IMPRESSION: 1.  No CT evidence for acute intracranial abnormality. 2. Atrophy and mild small vessel ischemic changes of the white matter Electronically Signed   By: Donavan Foil M.D.   On: 03/17/2019 22:45   Dg Foot 2 Views Left  Result Date: 03/17/2019 CLINICAL DATA:  Fall EXAM: LEFT FOOT - 2 VIEW COMPARISON:  09/26/2015 FINDINGS: Bones appear osteopenic. No acute displaced fracture or malalignment. Mild joint space narrowing at the first MTP joint. Mild narrowing at the PIP and DIP joints. IMPRESSION: No acute osseous abnormality Electronically Signed   By: Donavan Foil M.D.   On: 03/17/2019 22:49    Pending Labs Unresulted Labs (From admission, onward)    Start     Ordered   03/18/19 0500  Hemoglobin A1c  Tomorrow morning,   R     03/18/19 0029   03/18/19 0500  Lipid panel  Tomorrow morning,   R    Comments:  Fasting    03/18/19 0029   03/18/19 0500  Magnesium  Tomorrow morning,   R     03/18/19 0031   03/18/19 0026  Troponin I - Now Then Q6H  Now then every 6 hours,   R     03/18/19 0026   03/17/19 2026  Urine culture  ONCE - STAT,   STAT     03/17/19 2026          Vitals/Pain Today's Vitals   03/17/19 2315 03/18/19 0000 03/18/19 0015 03/18/19 0030  BP: 125/85 109/65 115/67 110/74  Pulse: 99 95 95 95  Resp: 14 (!) 22    Temp:      TempSrc:      SpO2: 94% 96% 95% 96%  PainSc:  Isolation Precautions No active isolations  Medications Medications  0.9 %  sodium chloride infusion (has no administration in time range)  nitroGLYCERIN (NITROSTAT) SL tablet 0.4 mg (has no administration in time range)   stroke: mapping our early stages of recovery book (has no administration in time range)  acetaminophen (TYLENOL) tablet 650 mg (has no administration in time range)    Or  acetaminophen (TYLENOL) solution 650 mg (has no administration in time range)    Or  acetaminophen (TYLENOL) suppository 650 mg (has no administration in time range)  senna-docusate (Senokot-S) tablet 1 tablet  (has no administration in time range)  potassium chloride 20 MEQ/15ML (10%) solution 20 mEq (has no administration in time range)  sodium chloride 0.9 % bolus 500 mL (0 mLs Intravenous Stopped 03/17/19 2253)    Mobility walks with device High fall risk   Focused Assessments Neuro Assessment Handoff:  Swallow screen pass?  Not assessed         Neuro Assessment:   Neuro Checks:      Last Documented NIHSS Modified Score:   Has TPA been given? No If patient is a Neuro Trauma and patient is going to OR before floor call report to [place] nurse: [phone] or [phone]     R Recommendations: See Admitting Provider Note  Report given to:   Additional Notes:

## 2019-03-18 NOTE — Progress Notes (Addendum)
[facility] PROGRESS NOTE  Chad Fuller FYB:[id number] DOB: 02-Oct-1926 DOA: 03/17/2019  PCP: Lajean Manes, MD  Brief History/Interval Summary: 85 y.o. male with medical history significant of stroke, diabetes mellitus, COPD, GERD, CAD, stent placement, lung cancer in remission (partial lumpectomy), BPH, remote PE, dCHF, GI bleeding, hard of hearing, who presented with left leg weakness, difficulty speaking, worsening left facial droop, fall, generalized weakness.  MRI raise concern for stroke.  Patient was hospitalized for further management.  Reason for Visit: Acute stroke  Consultants: Neurology  Procedures: Transthoracic echocardiogram is pending  Antibiotics: None  Subjective/Interval History: Patient states that usually he has been able to ambulate using a walker.  He states that he lives by himself.  He has noticed weakness in his legs left more than right.  ROS: Denies any nausea vomiting  Objective:  Vital Signs  Vitals:   03/18/19 0140 03/18/19 0340 03/18/19 0530 03/18/19 0752  BP: 140/77 (!) 95/43 114/77 110/65  Pulse: (!) 109 (!) 103 (!) 101 95  Resp: (!) 22 20 18 17   Temp: 98.2 F (36.8 C) 99 F (37.2 C) 98.3 F (36.8 C) 97.9 F (36.6 C)  TempSrc: Oral Oral Oral Oral  SpO2: 94% 92% 92% 94%    Intake/Output Summary (Last 24 hours) at 03/18/2019 1056 Last data filed at 03/18/2019 0549 Gross per 24 hour  Intake 581.25 ml  Output --  Net 581.25 ml   There were no vitals filed for this visit.  General appearance: alert, cooperative, appears stated age, distracted and no distress Head: Normocephalic, without obvious abnormality, atraumatic Resp: clear to auscultation bilaterally Cardio: S1-S2 is normal regular.  Systolic murmur appreciated over the precordium.  No S3-S4. GI: soft, non-tender; bowel sounds normal; no masses,  no organomegaly Extremities: Blackish discoloration of the tip of the 2nd toe on the left.  Poorly palpable pulses.   Both feet are warm. Pulses: Poorly palpable pulses in the lower extremities. Neurologic: He is alert.  Oriented to place.  No obvious facial asymmetry.  Strength noted to be equal bilateral upper extremities.  He is unable to fully lift both legs off the bed.  However he is able to bend both knees on his own.  Has good strength in the ankle flexors bilaterally.  .  Lab Results:  Data Reviewed: I have personally reviewed following labs and imaging studies  CBC: Recent Labs  Lab 03/17/19 2025  WBC 6.0  NEUTROABS 4.2  HGB 12.7*  HCT 36.7*  MCV 92.7  PLT PLATELET CLUMPS NOTED ON SMEAR, UNABLE TO ESTIMATE    Basic Metabolic Panel: Recent Labs  Lab 03/17/19 2025 03/18/19 0709  NA 132*  --   K 3.3*  --   CL 97*  --   CO2 26  --   GLUCOSE 249*  --   BUN 28*  --   CREATININE 1.25*  --   CALCIUM 8.8*  --   MG  --  1.8    GFR: CrCl cannot be calculated (Unknown ideal weight.).  Liver Function Tests: Recent Labs  Lab 03/17/19 2025  AST 27  ALT 14  ALKPHOS 93  BILITOT 1.3*  PROT 6.2*  ALBUMIN 3.3*     Cardiac Enzymes: Recent Labs  Lab 03/17/19 2042 03/17/19 2316 03/18/19 0041 03/18/19 0709  CKTOTAL  --  150  --   --   TROPONINI 0.08*  --  0.10* 0.15*     HbA1C: Recent Labs    03/18/19 0307  HGBA1C 9.7*  CBG: Recent Labs  Lab 03/17/19 2033 03/18/19 0147 03/18/19 0747  GLUCAP 245* 212* 183*    Lipid Profile: Recent Labs    03/18/19 0307  CHOL 107  HDL 33*  LDLCALC 61  TRIG 67  CHOLHDL 3.2     Radiology Studies: Dg Chest 2 View  Result Date: 03/17/2019 CLINICAL DATA:  Weakness EXAM: CHEST - 2 VIEW COMPARISON:  02/03/2018, 11/18/2014 FINDINGS: Ankylosis of the spine. No pleural effusion. Stable nodule in the right mid to upper lung. No acute consolidation. Stable cardiomediastinal silhouette with aortic atherosclerosis. No pneumothorax. IMPRESSION: No active cardiopulmonary disease. Electronically Signed   By: Donavan Foil M.D.   On:  03/17/2019 22:46   Dg Pelvis 1-2 Views  Result Date: 03/17/2019 CLINICAL DATA:  Weakness, fall EXAM: PELVIS - 1-2 VIEW COMPARISON:  None. FINDINGS: SI joints are non widened. Pubic symphysis and rami are intact. No fracture or malalignment. IMPRESSION: No acute osseous abnormality. Electronically Signed   By: Donavan Foil M.D.   On: 03/17/2019 22:47   Ct Head Wo Contrast  Result Date: 03/17/2019 CLINICAL DATA:  Head trauma EXAM: CT HEAD WITHOUT CONTRAST TECHNIQUE: Contiguous axial images were obtained from the base of the skull through the vertex without intravenous contrast. COMPARISON:  MRI 11/19/2014, CT brain 11/18/2014 FINDINGS: Brain: No acute territorial infarction, hemorrhage or intracranial mass. Marked atrophy. Small vessel ischemic changes of the white matter. Chronic lacunar infarct within the right basal ganglia. Prominent ventricles, felt secondary to atrophy Vascular: No hyperdense vessels.  Carotid vascular calcification Skull: Normal. Negative for fracture or focal lesion. Sinuses/Orbits: No acute finding. Other: None IMPRESSION: 1. No CT evidence for acute intracranial abnormality. 2. Atrophy and mild small vessel ischemic changes of the white matter Electronically Signed   By: Donavan Foil M.D.   On: 03/17/2019 22:45   Mr Virgel Paling RD Contrast  Result Date: 03/18/2019 CLINICAL DATA:  Initial evaluation for left leg weakness, difficulty speaking, worsening left facial droop, weakness. EXAM: MRI HEAD WITHOUT CONTRAST MRA HEAD WITHOUT CONTRAST MRA NECK WITHOUT AND WITH CONTRAST TECHNIQUE: Multiplanar, multiecho pulse sequences of the brain and surrounding structures were obtained without intravenous contrast. Angiographic images of the Circle of Willis were obtained using MRA technique without intravenous contrast. Angiographic images of the neck were obtained using MRA technique without and with intravenous contrast. Carotid stenosis measurements (when applicable) are obtained utilizing  NASCET criteria, using the distal internal carotid diameter as the denominator. COMPARISON:  Prior CT from 03/17/2019 FINDINGS: MRI HEAD FINDINGS Diffuse prominence of the CSF containing spaces compatible with generalized age-related cerebral atrophy. Mild T2/FLAIR hyperintensity within the periventricular white matter, most like related chronic micro vessel ischemic disease. Few small remote lacunar infarcts noted within the bilateral basal ganglia. Additional chronic lacunar infarct noted within the left ventral pons. Few additional small remote bilateral cerebellar infarcts noted. Scattered multifocal foci of restricted diffusion seen involving the bilateral cerebral hemispheres, consistent with acute ischemic infarcts. The most prominent of these positioned at the right basal ganglia/corona radiata and measures 18 mm (series 5, image 78). Remainder of the infarcts predominantly involve the deep and subcortical white matter, and are subcentimeter in size. No associated hemorrhage or mass effect. No acute infratentorial ischemia. No mass lesion, midline shift or mass effect. No hydrocephalus. No extra-axial fluid collection. Pituitary gland suprasellar region normal. Major intracranial vascular flow voids maintained. Intracranial dolichoectasia noted. Atlantooccipital assimilation. Craniocervical junction widely patent. Degenerative changes noted within the upper cervical spine with resultant mild to moderate spinal  stenosis at C3-4. Bone marrow signal intensity normal. No scalp soft tissue abnormality. Patient status post bilateral ocular lens replacement. Globes and orbital soft tissues demonstrate no acute finding. Paranasal sinuses are clear. No mastoid effusion. Inner ear structures grossly normal. MRA HEAD FINDINGS ANTERIOR CIRCULATION: Examination mildly degraded by motion artifact. Visualized distal cervical segments of the internal carotid arteries are patent with symmetric antegrade flow. Petrous,  cavernous, and supraclinoid segments patent without hemodynamically significant stenosis. Origin of the ophthalmic arteries patent. ICA termini well perfused. A1 segments patent bilaterally. Normal anterior communicating artery. Anterior cerebral arteries tortuous but patent to their distal aspects without stenosis. Right M1 widely patent. Left M1 patent proximally, but smoothly tapers distally. Superimposed short-segment moderate to severe distal left M1 stenosis just prior to the bifurcation (series 1028, image 12). Distal MCA branches perfused and symmetric. Scattered distal small vessel atheromatous irregularity. POSTERIOR CIRCULATION: Vertebral arteries patent to the vertebrobasilar junction without flow-limiting stenosis. Right vertebral artery slightly dominant. Posterior inferior cerebral arteries not seen. Left V4 segment tortuous. Basilar somewhat ectatic and tortuous but widely patent to its distal aspect without stenosis. Possible short-segment fenestration noted within the distal basilar artery. Superior cerebral arteries patent bilaterally. Left PCA supplied via the basilar. Right PCA supplied via a small right P1 as well as a prominent right posterior communicating artery. PCAs well perfused to their distal aspects without flow-limiting stenosis. MRA NECK FINDINGS Source images reviewed. Patent antegrade flow seen within the carotid and vertebral arteries bilaterally on time-of-flight sequence. Visualized aortic arch of normal caliber with normal branch pattern. No hemodynamically significant stenosis about the origin of the great vessels. Visualized subclavian arteries widely patent. Right common carotid artery patent from its origin to the bifurcation without stenosis. No significant atheromatous narrowing about the right bifurcation. Right ICA widely patent distally to the skull base without stenosis or vascular occlusion. Left common carotid artery patent from its origin to the bifurcation without  stenosis. No significant atheromatous narrowing about the left bifurcation. Left ICA patent distally to the skull base without stenosis or occlusion. Both of the vertebral arteries arise from the subclavian arteries. Right vertebral artery dominant. Vertebral arteries tortuous bilaterally. Dominant right vertebral artery widely patent. Short-segment moderate diffuse narrowing at the mid left V2 segment (series 1085, image 11), suspected to be related to extrinsic compression from uncovertebral disease. Left vertebral otherwise widely patent. IMPRESSION: MRI HEAD IMPRESSION: 1. Patchy multifocal ischemic infarcts involving the bilateral cerebral hemispheres as above, most prominent of which measures 18 mm involving the right basal ganglia. No associated hemorrhage or mass effect. A central thromboembolic etiology felt to be likely given the various vascular distributions involved. 2. Underlying atrophy with mild chronic microvascular ischemic disease, with a few scatter remote infarcts involving the bilateral basal ganglia, pons, and bilateral cerebellar hemispheres. MRA HEAD IMPRESSION: 1. Negative intracranial MRA for large vessel occlusion. 2. Single short-segment moderate to severe distal left M1 stenosis. No other hemodynamically significant or correctable stenosis identified. 3. 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 MRA NECK IMPRESSION: 1. Wide patency of the common and internal carotid arteries bilaterally. 2. Single focal moderate stenosis within the mid left V2 segment. Left vertebral arteries otherwise widely patent within the neck. Right vertebral artery dominant. Electronically Signed   By: Jeannine Boga M.D.   On: 03/18/2019 07:28   Mr Jodene Nam Neck W Wo Contrast  Result Date: 03/18/2019 CLINICAL DATA:  Initial evaluation for left leg weakness, difficulty speaking, worsening left facial droop, weakness. EXAM: MRI HEAD WITHOUT  CONTRAST MRA HEAD WITHOUT CONTRAST MRA NECK  WITHOUT AND WITH CONTRAST TECHNIQUE: Multiplanar, multiecho pulse sequences of the brain and surrounding structures were obtained without intravenous contrast. Angiographic images of the Circle of Willis were obtained using MRA technique without intravenous contrast. Angiographic images of the neck were obtained using MRA technique without and with intravenous contrast. Carotid stenosis measurements (when applicable) are obtained utilizing NASCET criteria, using the distal internal carotid diameter as the denominator. COMPARISON:  Prior CT from 03/17/2019 FINDINGS: MRI HEAD FINDINGS Diffuse prominence of the CSF containing spaces compatible with generalized age-related cerebral atrophy. Mild T2/FLAIR hyperintensity within the periventricular white matter, most like related chronic micro vessel ischemic disease. Few small remote lacunar infarcts noted within the bilateral basal ganglia. Additional chronic lacunar infarct noted within the left ventral pons. Few additional small remote bilateral cerebellar infarcts noted. Scattered multifocal foci of restricted diffusion seen involving the bilateral cerebral hemispheres, consistent with acute ischemic infarcts. The most prominent of these positioned at the right basal ganglia/corona radiata and measures 18 mm (series 5, image 78). Remainder of the infarcts predominantly involve the deep and subcortical white matter, and are subcentimeter in size. No associated hemorrhage or mass effect. No acute infratentorial ischemia. No mass lesion, midline shift or mass effect. No hydrocephalus. No extra-axial fluid collection. Pituitary gland suprasellar region normal. Major intracranial vascular flow voids maintained. Intracranial dolichoectasia noted. Atlantooccipital assimilation. Craniocervical junction widely patent. Degenerative changes noted within the upper cervical spine with resultant mild to moderate spinal stenosis at C3-4. Bone marrow signal intensity normal. No scalp  soft tissue abnormality. Patient status post bilateral ocular lens replacement. Globes and orbital soft tissues demonstrate no acute finding. Paranasal sinuses are clear. No mastoid effusion. Inner ear structures grossly normal. MRA HEAD FINDINGS ANTERIOR CIRCULATION: Examination mildly degraded by motion artifact. Visualized distal cervical segments of the internal carotid arteries are patent with symmetric antegrade flow. Petrous, cavernous, and supraclinoid segments patent without hemodynamically significant stenosis. Origin of the ophthalmic arteries patent. ICA termini well perfused. A1 segments patent bilaterally. Normal anterior communicating artery. Anterior cerebral arteries tortuous but patent to their distal aspects without stenosis. Right M1 widely patent. Left M1 patent proximally, but smoothly tapers distally. Superimposed short-segment moderate to severe distal left M1 stenosis just prior to the bifurcation (series 1028, image 12). Distal MCA branches perfused and symmetric. Scattered distal small vessel atheromatous irregularity. POSTERIOR CIRCULATION: Vertebral arteries patent to the vertebrobasilar junction without flow-limiting stenosis. Right vertebral artery slightly dominant. Posterior inferior cerebral arteries not seen. Left V4 segment tortuous. Basilar somewhat ectatic and tortuous but widely patent to its distal aspect without stenosis. Possible short-segment fenestration noted within the distal basilar artery. Superior cerebral arteries patent bilaterally. Left PCA supplied via the basilar. Right PCA supplied via a small right P1 as well as a prominent right posterior communicating artery. PCAs well perfused to their distal aspects without flow-limiting stenosis. MRA NECK FINDINGS Source images reviewed. Patent antegrade flow seen within the carotid and vertebral arteries bilaterally on time-of-flight sequence. Visualized aortic arch of normal caliber with normal branch pattern. No  hemodynamically significant stenosis about the origin of the great vessels. Visualized subclavian arteries widely patent. Right common carotid artery patent from its origin to the bifurcation without stenosis. No significant atheromatous narrowing about the right bifurcation. Right ICA widely patent distally to the skull base without stenosis or vascular occlusion. Left common carotid artery patent from its origin to the bifurcation without stenosis. No significant atheromatous narrowing about the left bifurcation. Left ICA  patent distally to the skull base without stenosis or occlusion. Both of the vertebral arteries arise from the subclavian arteries. Right vertebral artery dominant. Vertebral arteries tortuous bilaterally. Dominant right vertebral artery widely patent. Short-segment moderate diffuse narrowing at the mid left V2 segment (series 1085, image 11), suspected to be related to extrinsic compression from uncovertebral disease. Left vertebral otherwise widely patent. IMPRESSION: MRI HEAD IMPRESSION: 1. Patchy multifocal ischemic infarcts involving the bilateral cerebral hemispheres as above, most prominent of which measures 18 mm involving the right basal ganglia. No associated hemorrhage or mass effect. A central thromboembolic etiology felt to be likely given the various vascular distributions involved. 2. Underlying atrophy with mild chronic microvascular ischemic disease, with a few scatter remote infarcts involving the bilateral basal ganglia, pons, and bilateral cerebellar hemispheres. MRA HEAD IMPRESSION: 1. Negative intracranial MRA for large vessel occlusion. 2. Single short-segment moderate to severe distal left M1 stenosis. No other hemodynamically significant or correctable stenosis identified. 3. 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 MRA NECK IMPRESSION: 1. Wide patency of the common and internal carotid arteries bilaterally. 2. Single focal moderate stenosis within  the mid left V2 segment. Left vertebral arteries otherwise widely patent within the neck. Right vertebral artery dominant. Electronically Signed   By: Jeannine Boga M.D.   On: 03/18/2019 07:28   Mr Brain Wo Contrast  Result Date: 03/18/2019 CLINICAL DATA:  Initial evaluation for left leg weakness, difficulty speaking, worsening left facial droop, weakness. EXAM: MRI HEAD WITHOUT CONTRAST MRA HEAD WITHOUT CONTRAST MRA NECK WITHOUT AND WITH CONTRAST TECHNIQUE: Multiplanar, multiecho pulse sequences of the brain and surrounding structures were obtained without intravenous contrast. Angiographic images of the Circle of Willis were obtained using MRA technique without intravenous contrast. Angiographic images of the neck were obtained using MRA technique without and with intravenous contrast. Carotid stenosis measurements (when applicable) are obtained utilizing NASCET criteria, using the distal internal carotid diameter as the denominator. COMPARISON:  Prior CT from 03/17/2019 FINDINGS: MRI HEAD FINDINGS Diffuse prominence of the CSF containing spaces compatible with generalized age-related cerebral atrophy. Mild T2/FLAIR hyperintensity within the periventricular white matter, most like related chronic micro vessel ischemic disease. Few small remote lacunar infarcts noted within the bilateral basal ganglia. Additional chronic lacunar infarct noted within the left ventral pons. Few additional small remote bilateral cerebellar infarcts noted. Scattered multifocal foci of restricted diffusion seen involving the bilateral cerebral hemispheres, consistent with acute ischemic infarcts. The most prominent of these positioned at the right basal ganglia/corona radiata and measures 18 mm (series 5, image 78). Remainder of the infarcts predominantly involve the deep and subcortical white matter, and are subcentimeter in size. No associated hemorrhage or mass effect. No acute infratentorial ischemia. No mass lesion,  midline shift or mass effect. No hydrocephalus. No extra-axial fluid collection. Pituitary gland suprasellar region normal. Major intracranial vascular flow voids maintained. Intracranial dolichoectasia noted. Atlantooccipital assimilation. Craniocervical junction widely patent. Degenerative changes noted within the upper cervical spine with resultant mild to moderate spinal stenosis at C3-4. Bone marrow signal intensity normal. No scalp soft tissue abnormality. Patient status post bilateral ocular lens replacement. Globes and orbital soft tissues demonstrate no acute finding. Paranasal sinuses are clear. No mastoid effusion. Inner ear structures grossly normal. MRA HEAD FINDINGS ANTERIOR CIRCULATION: Examination mildly degraded by motion artifact. Visualized distal cervical segments of the internal carotid arteries are patent with symmetric antegrade flow. Petrous, cavernous, and supraclinoid segments patent without hemodynamically significant stenosis. Origin of the ophthalmic arteries patent. ICA termini well perfused.  A1 segments patent bilaterally. Normal anterior communicating artery. Anterior cerebral arteries tortuous but patent to their distal aspects without stenosis. Right M1 widely patent. Left M1 patent proximally, but smoothly tapers distally. Superimposed short-segment moderate to severe distal left M1 stenosis just prior to the bifurcation (series 1028, image 12). Distal MCA branches perfused and symmetric. Scattered distal small vessel atheromatous irregularity. POSTERIOR CIRCULATION: Vertebral arteries patent to the vertebrobasilar junction without flow-limiting stenosis. Right vertebral artery slightly dominant. Posterior inferior cerebral arteries not seen. Left V4 segment tortuous. Basilar somewhat ectatic and tortuous but widely patent to its distal aspect without stenosis. Possible short-segment fenestration noted within the distal basilar artery. Superior cerebral arteries patent bilaterally.  Left PCA supplied via the basilar. Right PCA supplied via a small right P1 as well as a prominent right posterior communicating artery. PCAs well perfused to their distal aspects without flow-limiting stenosis. MRA NECK FINDINGS Source images reviewed. Patent antegrade flow seen within the carotid and vertebral arteries bilaterally on time-of-flight sequence. Visualized aortic arch of normal caliber with normal branch pattern. No hemodynamically significant stenosis about the origin of the great vessels. Visualized subclavian arteries widely patent. Right common carotid artery patent from its origin to the bifurcation without stenosis. No significant atheromatous narrowing about the right bifurcation. Right ICA widely patent distally to the skull base without stenosis or vascular occlusion. Left common carotid artery patent from its origin to the bifurcation without stenosis. No significant atheromatous narrowing about the left bifurcation. Left ICA patent distally to the skull base without stenosis or occlusion. Both of the vertebral arteries arise from the subclavian arteries. Right vertebral artery dominant. Vertebral arteries tortuous bilaterally. Dominant right vertebral artery widely patent. Short-segment moderate diffuse narrowing at the mid left V2 segment (series 1085, image 11), suspected to be related to extrinsic compression from uncovertebral disease. Left vertebral otherwise widely patent. IMPRESSION: MRI HEAD IMPRESSION: 1. Patchy multifocal ischemic infarcts involving the bilateral cerebral hemispheres as above, most prominent of which measures 18 mm involving the right basal ganglia. No associated hemorrhage or mass effect. A central thromboembolic etiology felt to be likely given the various vascular distributions involved. 2. Underlying atrophy with mild chronic microvascular ischemic disease, with a few scatter remote infarcts involving the bilateral basal ganglia, pons, and bilateral cerebellar  hemispheres. MRA HEAD IMPRESSION: 1. Negative intracranial MRA for large vessel occlusion. 2. Single short-segment moderate to severe distal left M1 stenosis. No other hemodynamically significant or correctable stenosis identified. 3. 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 MRA NECK IMPRESSION: 1. Wide patency of the common and internal carotid arteries bilaterally. 2. Single focal moderate stenosis within the mid left V2 segment. Left vertebral arteries otherwise widely patent within the neck. Right vertebral artery dominant. Electronically Signed   By: Jeannine Boga M.D.   On: 03/18/2019 07:28   Dg Foot 2 Views Left  Result Date: 03/17/2019 CLINICAL DATA:  Fall EXAM: LEFT FOOT - 2 VIEW COMPARISON:  09/26/2015 FINDINGS: Bones appear osteopenic. No acute displaced fracture or malalignment. Mild joint space narrowing at the first MTP joint. Mild narrowing at the PIP and DIP joints. IMPRESSION: No acute osseous abnormality Electronically Signed   By: Donavan Foil M.D.   On: 03/17/2019 22:49     Medications:  Scheduled:  alfuzosin  10 mg Oral Q breakfast   aspirin  300 mg Rectal Daily   ezetimibe  10 mg Oral Daily   finasteride  5 mg Oral Daily   insulin aspart  0-9 Units Subcutaneous TID WC  insulin glargine  30 Units Subcutaneous Daily   multivitamin with minerals  1 tablet Oral Daily   Continuous:  sodium chloride 75 mL/hr at 03/17/19 2324   RPR:[id number] **OR** acetaminophen (TYLENOL) oral liquid 160 mg/5 mL **OR** acetaminophen, hydrALAZINE, nitroGLYCERIN, ondansetron (ZOFRAN) IV, polyethylene glycol, senna-docusate    Assessment/Plan:    Acute stroke/left leg weakness MRI brain showed patchy multifocal ischemic infarcts involving the bilateral cerebral hemispheres.  Neurology is following and consulting.  MRA neck did not show any significant stenosis.  Echocardiogram is pending.  LDL is 61.  HbA1c 9.7.  PT OT and swallow  evaluation.  History of coronary artery disease with mildly elevated troponin EKG did not show any ischemic changes.  Patient denies any chest pain.  Mild leak of troponin likely due to other acute illness and possible demand ischemia.  We will see what the echocardiogram reveals.  No need to involve cardiology at this time.  Continue aspirin statin.  Not noted to be on beta-blocker at home.  Unknown as to the type of coronary artery disease and what interventions have been performed previously.  No cardiology visits noted in the EMR.  Okay for the patient to do physical and Occupational Therapy.  Diabetes mellitus type 2 without any complications MMN8T 9.7.  Monitor CBGs.  SSI.  Continue lower dose of Lantus.  History of GERD PPI  History of BPH Continue home medications.  Chronic diastolic CHF Echocardiogram from 2019 showed a EF of 60% with grade 1 diastolic dysfunction.  Seems to be euvolemic currently.  His Lasix is on hold.  History of fall PT and OT evaluation.  No obvious injuries identified.  Acute kidney injury with hypokalemia BUN and creatinine noted to be higher than his baseline.  Baseline creatinine 0.6 however this was from 2016.  Holding his diuretics.  Avoid nephrotoxic agent.  Recheck labs tomorrow.  Potassium was repleted.  Magnesium 1.8.   Essential hypertension Hyzaar and furosemide are on hold.  Hyperlipidemia LDL at goal at 61.  Patient is on Zetia which will be continued.  Dark discoloration of the tip of the 2nd toe on the left Could have peripheral artery disease.  Based on arterial Dopplers done in 2017 his he had near normal ABIs.  He has very poor pulsations however no evidence for acute ischemia in the lower extremities.  This can be pursued in the outpatient setting.  DVT Prophylaxis: SCDs    Code Status: DNR Family Communication: Discussed with the patient.  No family at bedside Disposition Plan: Management as outlined above    LOS: 0 days    [facility] Pager on [email or web address]  03/18/2019, 10:56 AM

## 2019-03-18 NOTE — Progress Notes (Signed)
STROKE TEAM PROGRESS NOTE   SUBJECTIVE (INTERVAL HISTORY) No family is at the bedside.  Pt sitting in bed, orientated, stated generalized weakness, no focal neuro deficit. Denies heart palpitation or racing heart.   OBJECTIVE Vitals:   03/18/19 0140 03/18/19 0340 03/18/19 0530 03/18/19 0752  BP: 140/77 (!) 95/43 114/77 110/65  Pulse: (!) 109 (!) 103 (!) 101 95  Resp: (!) 22 20 18 17   Temp: 98.2 F (36.8 C) 99 F (37.2 C) 98.3 F (36.8 C) 97.9 F (36.6 C)  TempSrc: Oral Oral Oral Oral  SpO2: 94% 92% 92% 94%    CBC:  Recent Labs  Lab 03/17/19 2025  WBC 6.0  NEUTROABS 4.2  HGB 12.7*  HCT 36.7*  MCV 92.7  PLT PLATELET CLUMPS NOTED ON SMEAR, UNABLE TO ESTIMATE    Basic Metabolic Panel:  Recent Labs  Lab 03/17/19 2025  NA 132*  K 3.3*  CL 97*  CO2 26  GLUCOSE 249*  BUN 28*  CREATININE 1.25*  CALCIUM 8.8*    Lipid Panel:     Component Value Date/Time   CHOL 107 03/18/2019 0307   TRIG 67 03/18/2019 0307   HDL 33 (L) 03/18/2019 0307   CHOLHDL 3.2 03/18/2019 0307   VLDL 13 03/18/2019 0307   LDLCALC 61 03/18/2019 0307   HgbA1c:  Lab Results  Component Value Date   HGBA1C 9.7 (H) 03/18/2019   Urine Drug Screen:     Component Value Date/Time   LABOPIA NONE DETECTED 11/19/2014 0440   COCAINSCRNUR NONE DETECTED 11/19/2014 0440   LABBENZ NONE DETECTED 11/19/2014 0440   AMPHETMU NONE DETECTED 11/19/2014 0440   THCU NONE DETECTED 11/19/2014 0440   LABBARB NONE DETECTED 11/19/2014 0440    Alcohol Level No results found for: ETH  IMAGING  Ct Head Wo Contrast 03/17/2019 IMPRESSION:  1. No CT evidence for acute intracranial abnormality.  2. Atrophy and mild small vessel ischemic changes of the white matter   Mr [facility] Contrast Mr Jodene Nam Neck W Wo Contrast Mr Brain Wo Contrast 03/18/2019 IMPRESSION:  MRI HEAD  IMPRESSION:  1. Patchy multifocal ischemic infarcts involving the bilateral cerebral hemispheres as above, most prominent of which measures 18  mm involving the right basal ganglia. No associated hemorrhage or mass effect. A central thromboembolic etiology felt to be likely given the various vascular distributions involved.  2. Underlying atrophy with mild chronic microvascular ischemic disease, with a few scatter remote infarcts involving the bilateral basal ganglia, pons, and bilateral cerebellar hemispheres.   MRA HEAD  IMPRESSION:  1. Negative intracranial MRA for large vessel occlusion.  2. Single short-segment moderate to severe distal left M1 stenosis. No other hemodynamically significant or correctable stenosis identified. 3. 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   MRA NECK  IMPRESSION:  1. Wide patency of the common and internal carotid arteries bilaterally.  2. Single focal moderate stenosis within the mid left V2 segment. Left vertebral arteries otherwise widely patent within the neck. Right vertebral artery dominant.    Transthoracic Echocardiogram  Pending   EKG - SR rate 98 BPM. (See cardiology reading for complete details)    PHYSICAL EXAM  Temp:  [97.9 F (36.6 C)-99 F (37.2 C)] 98.2 F (36.8 C) (03/21 1230) Pulse Rate:  [85-109] 85 (03/21 1230) Resp:  [14-25] 19 (03/21 1230) BP: (95-140)/(43-85) 125/76 (03/21 1230) SpO2:  [92 %-97 %] 94 % (03/21 1230) Weight:  [89.6 kg] 89.6 kg (03/21 1154)  General - Well nourished, well developed, in  no apparent distress.  Ophthalmologic - fundi not visualized due to noncooperation.  Cardiovascular - Regular rate and rhythm.  Mental Status -  Level of arousal and orientation to month, place, and person were intact, however not orientated to year. Language including expression, naming, repetition, comprehension was assessed and found intact. Fund of Knowledge was assessed and was intact.  Cranial Nerves II - XII - II - Visual field intact OU. III, IV, VI - Extraocular movements intact. V - Facial sensation intact bilaterally. VII  - Facial movement intact bilaterally. VIII - Hearing & vestibular intact bilaterally. X - Palate elevates symmetrically. XI - Chin turning & shoulder shrug intact bilaterally. XII - Tongue protrusion intact.  Motor Strength - The patient's strength was 4/5 bilateral upper extremities, 2+/5 bilateral lower extremities proximal and 3/5 distally and pronator drift was absent.  Bulk was normal and fasciculations were absent.   Motor Tone - Muscle tone was assessed at the neck and appendages and was normal.  Reflexes - The patient's reflexes were symmetrical in all extremities and he had no pathological reflexes.  Sensory - Light touch, temperature/pinprick were assessed and were symmetrical.    Coordination - The patient had normal movements in the hands with no ataxia or dysmetria.  Tremor was absent.  Gait and Station - deferred.    ASSESSMENT/PLAN Mr. TRENT GABLER is a 85 y.o. male with history of diabetes, coronary artery disease, lung cancer, and PE presenting after a transient episode of left-sided weakness, chronic left facial droop - possibly worse, recent fall and difficulty speaking.Marland Kitchen He did not receive IV t-PA due to late presentation.  Stroke: Bilateral patchy multifocal ischemic infarcts - embolic - source unknown.  Resultant generalized weakness  CT head - No CT evidence for acute intracranial abnormality.   MRI head - Patchy multifocal ischemic infarcts involving the bilateral cerebral hemispheres, most prominent of which measures 18 mm involving the right basal ganglia. Multiple remote infarcts.  MRA head and neck- focal moderate stenosis within the left M1 and mid left V2 segment  2D Echo - pending  Recommend Cardiac monitoring to rule out A. fib.  LDL - 61  HgbA1c - 9.7  VTE prophylaxis - SCDs  aspirin 81 mg daily prior to admission, now on aspirin 325 mg daily  Patient will be counseled to be compliant with his antithrombotic medications  Ongoing  aggressive stroke risk factor management  Therapy recommendations:  pending  Disposition:  Pending  Hx of stroke . 10/2014 admitted for dysarthria and ataxia, MRI showed left paracentral pontine infarct secondary to small vessel disease.  MRA unremarkable.  Carotid Doppler negative.  TTE no source of emboli. LDL 70 and A1c 7.9.  Put on 325 aspirin on discharge.  Hypertension  Blood pressure somewhat low at times. . Permissive hypertension (OK if < 220/120) but gradually normalize in 5-7 days  Long-term BP goal normotensive   Hyperlipidemia  Lipid lowering medication PTA:  Zetia 10 mg daily  LDL 61, goal < 70  Current lipid lowering medication: Zetia 10 mg daily  Continue Zetia at discharge  Diabetes  HgbA1c 9.7, goal < 7.0  Uncontrolled  On Lantus  SSI  CBG monitoring  Close PCP follow-up and better DM control  Other Stroke Risk Factors  Advanced age  Former cigarette smoker - quit 44 years ago  Coronary artery disease  Other Active Problems  Elevated troponins - 0.08->0.10->0.15  Hypokalemia - 3.3  Hyponatremia Na - 132  Cancer Hx -stage Ia lung  cancer non-small cell status post wadge resection of right upper lobe in 2005  Hospital day # 0  I spent  35 minutes in total face-to-face time with the patient, more than 50% of which was spent in counseling and coordination of care, reviewing test results, images and medication, and discussing the diagnosis of embolic stroke, history of stroke, uncontrolled diabetes, treatment plan and potential prognosis. This patient's care requiresreview of multiple databases, neurological assessment, discussion with family, other specialists and medical decision making of high complexity.  I also discussed with Dr. Maryland Pink.  Rosalin Hawking, MD PhD Stroke Neurology 03/18/2019 2:47 PM   To contact Stroke Continuity provider, please refer to [email or web address] After hours, contact General Neurology

## 2019-03-18 NOTE — Progress Notes (Signed)
Just put DNR bracelet on Pt for RN

## 2019-03-18 NOTE — Evaluation (Signed)
Clinical/Bedside Swallow Evaluation Patient Details  Name: Chad Fuller MRN: [id number] Date of Birth: 1926/08/21  Today's Date: 03/18/2019 Time: SLP Start Time (ACUTE ONLY): 0830 SLP Stop Time (ACUTE ONLY): 0855 SLP Time Calculation (min) (ACUTE ONLY): 25 min  Past Medical History:  Past Medical History:  Diagnosis Date  . CAD (coronary artery disease)   . Cancer ([place])    lung  . Diabetes mellitus   . History of blood clots   . Prostate enlargement   . Pulmonary embolus [facility]) july 2005   Past Surgical History:  Past Surgical History:  Procedure Laterality Date  . COLONOSCOPY N/A 07/24/2015   Procedure: COLONOSCOPY;  Surgeon: Laurence Spates, MD;  Location: [facility] ENDOSCOPY;  Service: Endoscopy;  Laterality: N/A;  . CORONARY STENT PLACEMENT    . ESOPHAGOGASTRODUODENOSCOPY N/A 07/24/2015   Procedure: ESOPHAGOGASTRODUODENOSCOPY (EGD);  Surgeon: Laurence Spates, MD;  Location: [facility] ENDOSCOPY;  Service: Endoscopy;  Laterality: N/A;  bleeding source not in colon; EGD performed to locate source  . FILTERING PROCEDURE     reports filter placed after knee surgery for blood clots  . I&D EXTREMITY  08/16/2012   Procedure: MINOR IRRIGATION AND DEBRIDEMENT EXTREMITY;  Surgeon: Tennis Must, MD;  Location: [place];  Service: Orthopedics;  Laterality: Right;  . KNEE SURGERY    . LUNG REMOVAL, PARTIAL     HPI:   Chad Fuller is a 85 y.o. male with medical history significant of stroke, diabetes mellitus, COPD, GERD, CAD, stent placement, lung cancer in remission (partial lumpectomy), BPH, remote PE, dCHF, GI bleeding, hard of hearing, who presented to ER with left leg weakness, difficulty speaking, worsening left facial droop, fall, generalized weakness. 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 Discharged from [facility] with mechanical soft diet and nectar thickened liquids. MRI shows patchy multifocal ischemic infarcts involving the bilateral cerebral hemispheres.    Assessment / Plan / Recommendation Clinical Impression  Patient was awake and alert when entering room. He is NPO after failing the [place] given by nursing. He has a history of moderate pharyngeal dysphagia following a CVA in 2015 (discharged from CIR with Dys 3, nectar diet). Patient was tolerating a regular diet with thin liquids prior to this hospitalization.  MRI shows patchy multifocal ischemic infarcts involving the bilateral cerebral hemispheres. Oral motor exam was normal with a strong volitional cough and swallow. Patient was presented with ice chips, water by tsp, small cup sips and pureed. He tolerated ice chips and puree with no s/s of aspiration. However, he had a delayed or immediate cough with all bolus sizes of water. Recommend instrumental evaluation to objectively evaluate swallowing function. Nurse and patient notified.  SLP Visit Diagnosis: Dysphagia, pharyngeal phase (R13.13)    Aspiration Risk       Diet Recommendation NPO   Medication Administration: Via alternative means    Other  Recommendations     Follow up Recommendations        Frequency and Duration            Prognosis        Swallow Study   General Date of Onset: 03/16/20 HPI:  Chad Fuller is a 85 y.o. male with medical history significant of stroke, diabetes mellitus, COPD, GERD, CAD, stent placement, lung cancer in remission (partial lumpectomy), BPH, remote PE, dCHF, GI bleeding, hard of hearing, who presented to ER with left leg weakness, difficulty speaking, worsening left facial droop, fall, generalized weakness. History of CVA 10/2004  resulting with neurogenic dysphagia. Discharged from [facility] with mechanical soft diet and nectar thickened liquids. MRI shows patchy multifocal ischemic infarcts involving the bilateral cerebral hemispheres. Type of Study: Bedside Swallow Evaluation Previous Swallow Assessment: 10/2004 Diet Prior to this Study: NPO Temperature Spikes Noted:  No Respiratory Status: Room air History of Recent Intubation: No Behavior/Cognition: Alert;Cooperative;Pleasant mood Oral Cavity Assessment: Within Functional Limits Oral Care Completed by SLP: Yes Oral Cavity - Dentition: Dentures, top(bottom implants) Vision: Functional for self-feeding Self-Feeding Abilities: Able to feed self Patient Positioning: Upright in bed Baseline Vocal Quality: Normal Volitional Cough: Strong Volitional Swallow: Able to elicit    Oral/Motor/Sensory Function Overall Oral Motor/Sensory Function: Within functional limits   Ice Chips Ice chips: Within functional limits Presentation: Spoon   Thin Liquid Thin Liquid: Impaired Presentation: Spoon;Cup Pharyngeal  Phase Impairments: Suspected delayed Swallow;Wet Vocal Quality;Cough - Immediate;Cough - Delayed    Nectar Thick Nectar Thick Liquid: Not tested   Honey Thick Honey Thick Liquid: Not tested   Puree Puree: Within functional limits Presentation: Self Fed   Solid     Solid: Not tested     Chad Cousins Reyonna Haack, MA, CCC-SLP 03/18/2019 9:07 AM

## 2019-03-18 NOTE — Progress Notes (Signed)
Modified Barium Swallow Progress Note  Patient Details  Name: Chad Fuller MRN: [id number] Date of Birth: May 09, 1926  Today's Date: 03/18/2019  Modified Barium Swallow completed.  Full report located under Chart Review in the Imaging Section.  Brief recommendations include the following:  Clinical Impression  Patient hospitalized with CVA and Dysphagia from previous CVA in 2015. Patient presents with mild to moderate oropharyngeal dsyphagia. Oral phase characterized by poor bolus formation with all liquids, poor tongue elevation and propulsion of bolus, spillage to pyriform sinuses on both thin liquids and nectar thickened liquids (delayed swallow). Penetration and aspiration on larger thin liquid cup sips during swallow with no sensation. Inability to clear with a cued cough. Mild pharyngeal coating with liquids. Swallow triggered with solids as bolus passes tip of epiglottis. Complete epiglottis inversion and laryngeal closure. Adequate UES opening with no pyriform residue. Esophageal sweep showed good clearance. Recommend Regular diet with nectar thickened liquds, medications to be given in whole in puree. Patient to sit at 90 degrees for all meals. Speech therapy to follow up dysphagia therapy, diet tolerance and upgrades. Patient seemingly understood diet recommendation and aspiration precautions, despite his age and recent CVA. Nurse notified of diet and aspiration precautions.    Swallow Evaluation Recommendations       SLP Diet Recommendations: Regular solids;Nectar thick liquid   Liquid Administration via: Cup;No straw   Medication Administration: Whole meds with puree   Supervision: Patient able to self feed;Intermittent supervision to cue for compensatory strategies   Compensations: Slow rate;Small sips/bites   Postural Changes: Seated upright at 90 degrees   Oral Care Recommendations: Oral care BID   Other Recommendations: Order thickener from [place], MA, CCC-SLP 03/18/2019 11:19 AM

## 2019-03-18 NOTE — Progress Notes (Signed)
Arrived from Ed at [place]. Alert and oriented to person and place. Denies any pain. Call light within reach.

## 2019-03-18 NOTE — Consult Note (Signed)
Requesting Physician: Dr. Reather Converse    Chief Complaint: Left leg weakness  History obtained from: Patient and Chart     HPI:                                                                                                                                       Chad Fuller is a 85 y.o. male with past medical history of diabetes, coronary artery disease, lung cancer, PE presents to the ER after having transient episode of left-sided weakness and difficulty speaking. Patient currently complains of generalized weakness.  Patient also states he had a fall while using the bathroom. He was evaluated by EDP who was concerned for transient ischemic attack and admitted to medicine service for further evaluation.  Accompanied included a head CT which was negative, as well as left foot and pelvis Xray which was also negative.  No leukocytosis, chest x-ray negative.  Chronic left facial droop which has been worse than normal according to his daughter.   Date last known well: 3.20-20 tPA Given: No, outside the window    Past Medical History:  Diagnosis Date  . CAD (coronary artery disease)   . Cancer ([place])    lung  . Diabetes mellitus   . History of blood clots   . Prostate enlargement   . Pulmonary embolus [facility]) july 2005    Past Surgical History:  Procedure Laterality Date  . COLONOSCOPY N/A 07/24/2015   Procedure: COLONOSCOPY;  Surgeon: Laurence Spates, MD;  Location: [facility] ENDOSCOPY;  Service: Endoscopy;  Laterality: N/A;  . CORONARY STENT PLACEMENT    . ESOPHAGOGASTRODUODENOSCOPY N/A 07/24/2015   Procedure: ESOPHAGOGASTRODUODENOSCOPY (EGD);  Surgeon: Laurence Spates, MD;  Location: [facility] ENDOSCOPY;  Service: Endoscopy;  Laterality: N/A;  bleeding source not in colon; EGD performed to locate source  . FILTERING PROCEDURE     reports filter placed after knee surgery for blood clots  . I&D EXTREMITY  08/16/2012   Procedure: MINOR IRRIGATION AND DEBRIDEMENT EXTREMITY;  Surgeon: Tennis Must, MD;   Location: [place];  Service: Orthopedics;  Laterality: Right;  . KNEE SURGERY    . LUNG REMOVAL, PARTIAL      Family History  Problem Relation Age of Onset  . Diabetes Mellitus II Sister    Social History:  reports that he quit smoking about 44 years ago. His smoking use included cigarettes. He has never used smokeless tobacco. He reports that he does not drink alcohol or use drugs.  Allergies:  Allergies  Allergen Reactions  . Pantoprazole Diarrhea  . Protonix [Pantoprazole Sodium] Diarrhea    Medications:  I reviewed home medications   ROS:                                                                                                                                     14 systems reviewed and negative except above   Examination:                                                                                                      General: Appears well-developed  Psych: Affect appropriate to situation Eyes: No scleral injection HENT: No OP obstrucion Head: Normocephalic.  Cardiovascular: Normal rate and regular rhythm.  Respiratory: Effort normal and breath sounds normal to anterior ascultation GI: Soft.  No distension. There is no tenderness.  Skin: WDI    Neurological Examination Mental Status: Alert, oriented to month, age and reason for hospitalization. Thought content appropriate.  Speech fluent without evidence of aphasia. Able to follow 3 step commands without difficulty. Cranial Nerves: II: Visual fields grossly normal,  III,IV, VI: ptosis not present, extra-ocular motions intact bilaterally, pupils equal, round, reactive to light and accommodation V,VII: smile symmetric, facial light touch sensation normal bilaterally VIII: hearing normal bilaterally IX,X: uvula rises symmetrically XI: bilateral shoulder shrug XII: midline  tongue extension Motor: Right : Upper extremity   5/5    Left:     Upper extremity   5/5  Lower extremity   3/5     Lower extremity   3/5 Tone and bulk:normal tone throughout; no atrophy noted Sensory: Pinprick and light touch intact throughout, bilaterally Plantars: Right: downgoing   Left: downgoing Cerebellar: normal finger-to-nose, normal rapid alternating movements and normal heel-to-shin test Gait: not assessed      Lab Results: Basic Metabolic Panel: Recent Labs  Lab 03/17/19 2025  NA 132*  K 3.3*  CL 97*  CO2 26  GLUCOSE 249*  BUN 28*  CREATININE 1.25*  CALCIUM 8.8*    CBC: Recent Labs  Lab 03/17/19 2025  WBC 6.0  NEUTROABS 4.2  HGB 12.7*  HCT 36.7*  MCV 92.7  PLT PLATELET CLUMPS NOTED ON SMEAR, UNABLE TO ESTIMATE    Coagulation Studies: No results for input(s): LABPROT, INR in the last 72 hours.  Imaging: Dg Chest 2 View  Result Date: 03/17/2019 CLINICAL DATA:  Weakness EXAM: CHEST - 2 VIEW COMPARISON:  02/03/2018, 11/18/2014 FINDINGS: Ankylosis of the spine. No pleural effusion. Stable nodule in the right mid to upper lung. No acute consolidation. Stable cardiomediastinal silhouette with aortic atherosclerosis. No pneumothorax. IMPRESSION:  No active cardiopulmonary disease. Electronically Signed   By: Donavan Foil M.D.   On: 03/17/2019 22:46   Dg Pelvis 1-2 Views  Result Date: 03/17/2019 CLINICAL DATA:  Weakness, fall EXAM: PELVIS - 1-2 VIEW COMPARISON:  None. FINDINGS: SI joints are non widened. Pubic symphysis and rami are intact. No fracture or malalignment. IMPRESSION: No acute osseous abnormality. Electronically Signed   By: Donavan Foil M.D.   On: 03/17/2019 22:47   Ct Head Wo Contrast  Result Date: 03/17/2019 CLINICAL DATA:  Head trauma EXAM: CT HEAD WITHOUT CONTRAST TECHNIQUE: Contiguous axial images were obtained from the base of the skull through the vertex without intravenous contrast. COMPARISON:  MRI 11/19/2014, CT brain 11/18/2014  FINDINGS: Brain: No acute territorial infarction, hemorrhage or intracranial mass. Marked atrophy. Small vessel ischemic changes of the white matter. Chronic lacunar infarct within the right basal ganglia. Prominent ventricles, felt secondary to atrophy Vascular: No hyperdense vessels.  Carotid vascular calcification Skull: Normal. Negative for fracture or focal lesion. Sinuses/Orbits: No acute finding. Other: None IMPRESSION: 1. No CT evidence for acute intracranial abnormality. 2. Atrophy and mild small vessel ischemic changes of the white matter Electronically Signed   By: Donavan Foil M.D.   On: 03/17/2019 22:45   Dg Foot 2 Views Left  Result Date: 03/17/2019 CLINICAL DATA:  Fall EXAM: LEFT FOOT - 2 VIEW COMPARISON:  09/26/2015 FINDINGS: Bones appear osteopenic. No acute displaced fracture or malalignment. Mild joint space narrowing at the first MTP joint. Mild narrowing at the PIP and DIP joints. IMPRESSION: No acute osseous abnormality Electronically Signed   By: Donavan Foil M.D.   On: 03/17/2019 22:49     ASSESSMENT AND PLAN  89y with past medical history of diabetes, coronary artery disease, lung cancer, PE presents with transient left leg weakness, difficulty speaking. He is now improved and no longer has facial droop.  MRI brain shows bilateral infarcts likely in watershed distrubution, although may also be embolic. Marland Kitchen MRA neck shows no signficant carotid stenosis, MRA of the head shows focal segment of stenosis in the left MCA.   Etiology: watershed vs embolic   Recommend # MRI of the brain without contrast: completed #MRA Head and neck : completed  #Transthoracic Echo  # Start patient on ASA 325mg  daily, consider dual antiplatelet with ASA and plavix #Start or continue Atorvastatin 40 mg/other high intensity statin # BP goal: permissive HTN upto 220/120 mmHg AVOID HYPOTENSION  # HBAIC and Lipid profile # Telemetry monitoring # Frequent neuro checks # NPO until passes stroke  swallow screen  Please page stroke NP  Or  PA  Or MD from 8am -4 pm  as this patient from this time will be  followed by the stroke.   You can look them up on [email or web address]  Password [facility]      [facility] Pager Number [phone]

## 2019-03-18 NOTE — Progress Notes (Signed)
Rehab Admissions Coordinator Note:  Patient was screened by Chad Fuller for appropriateness for an Inpatient Acute Rehab Consult.  At this time, we are recommending Inpatient Rehab consult.  [name] M 03/18/2019, 4:14 PM  I can be reached at [phone].

## 2019-03-18 NOTE — Evaluation (Signed)
Occupational Therapy Evaluation Patient Details Name: Chad Fuller MRN: [id number] DOB: [birth year] Today's Date: 03/18/2019    History of Present Illness Chad Fuller is a 85 y.o. male with medical history significant of stroke, diabetes mellitus, COPD, GERD, CAD, stent placement, lung cancer in remission (partial lumpectomy), BPH, remote PE, dCHF, GI bleeding, hard of hearing, who presented to ER with left leg weakness, difficulty speaking, worsening left facial droop, fall, generalized weakness. History of CVA 10/2004. MRI revealed Patchy multifocal ischemic infarcts involving the bilateral cerebral hemispheres   Clinical Impression   Pt report living alone and still driving. He walks with a RW. Pt states he sponge bathed out of fear of falling in the tub. He struggled with LB dressing and reports it required a extended period of time. Pt presents with generalized weakness, but slightly more weak in his R UE vs L with mild incoordination noted while self feeding. Pt requires 2 person assist for OOB mobility. Recommending continued rehab in CIR prior to return home. Will follow acutely.    Follow Up Recommendations  CIR    Equipment Recommendations  Other (comment)(defer to next venue)    Recommendations for Other Services       Precautions / Restrictions Precautions Precautions: Fall Precaution Comments: pt reports multiple falls      Mobility Bed Mobility Overal bed mobility: Needs Assistance Bed Mobility: Supine to Sit     Supine to sit: Mod assist     General bed mobility comments: moderate assist to move to EOB with multi modal cues for hand placement and positioning to pull to sit with bed rail.  Transfers Overall transfer level: Needs assistance Equipment used: Rolling walker (2 wheeled) Transfers: Sit to/from [facility] Sit to Stand: Mod assist;Max assist;+2 physical assistance(+2 mod from bed, +2 max from [facility]) Stand pivot transfers: Mod  assist;+2 physical assistance       General transfer comment: Increased time and effort to power to standing, pivot to [facility] with face to face 2 person assist, sit <> stand from [facility] for hygiene using RW, Max assist and cues for hand placement    Balance Overall balance assessment: Needs assistance;History of Falls Sitting-balance support: Feet supported Sitting balance-Leahy Scale: Fair Sitting balance - Comments: able to sit EOB unsupported    Standing balance support: Bilateral upper extremity supported;During functional activity Standing balance-Leahy Scale: Poor                             ADL either performed or assessed with clinical judgement   ADL Overall ADL's : Needs assistance/impaired Eating/Feeding: Minimal assistance;Sitting Eating/Feeding Details (indicate cue type and reason): to open container of applesauce Grooming: Set up;Sitting   Upper Body Bathing: Minimal assistance;Sitting   Lower Body Bathing: Total assistance;+2 for physical assistance;Sit to/from stand   Upper Body Dressing : Minimal assistance;Sitting   Lower Body Dressing: Total assistance;+2 for physical assistance;Sit to/from stand   Toilet Transfer: +2 for physical assistance;Moderate assistance;Stand-pivot;BSC   Toileting- Clothing Manipulation and Hygiene: Total assistance;+2 for physical assistance;Sit to/from stand               Vision Baseline Vision/History: Wears glasses Wears Glasses: Reading only Patient Visual Report: No change from baseline       Perception     Praxis      Pertinent Vitals/Pain Pain Assessment: Faces Faces Pain Scale: Hurts even more Pain Location: bilateral feet and left ankle Pain Descriptors /  Indicators: Aching Pain Intervention(s): Monitored during session;Repositioned     Hand Dominance Right   Extremity/Trunk Assessment Upper Extremity Assessment Upper Extremity Assessment: LUE deficits/detail;RUE deficits/detail RUE Deficits /  Details: overall weakness noted, modest assymetrical weakness R weaker than left RUE Coordination: decreased fine motor;decreased gross motor LUE Deficits / Details: generalized weakness   Lower Extremity Assessment Lower Extremity Assessment: Generalized weakness   Cervical / Trunk Assessment Cervical / Trunk Assessment: Kyphotic   Communication Communication Communication: HOH   Cognition Arousal/Alertness: Awake/alert Behavior During Therapy: WFL for tasks assessed/performed Overall Cognitive Status: No family/caregiver present to determine baseline cognitive functioning Area of Impairment: Awareness;Problem solving;Following commands                       Following Commands: Follows one step commands with increased time   Awareness: Emergent Problem Solving: Slow processing;Requires tactile cues;Requires verbal cues     General Comments  noted bilateral foot lacerations on toes, L laceration larger. Noted skin tear left forarm.    Exercises     Shoulder Instructions      Home Living Family/patient expects to be discharged to:: Private residence Living Arrangements: Alone Available Help at Discharge: Family;Available PRN/intermittently Type of Home: House Home Access: Stairs to enter [employer] of Steps: 4 Entrance Stairs-Rails: Right Home Layout: One level     Bathroom Shower/Tub: Teacher, early years/pre: Standard     Home Equipment: Environmental consultant - 2 wheels;Cane - single point;Shower seat;Bedside commode          Prior Functioning/Environment Level of Independence: Independent with assistive device(s)        Comments: patient still drives        OT Problem List: Decreased strength;Decreased activity tolerance;Impaired balance (sitting and/or standing);Decreased coordination;Decreased cognition;Decreased safety awareness;Decreased knowledge of use of DME or AE;Impaired UE functional use;Pain      OT Treatment/Interventions:  Self-care/ADL training;DME and/or AE instruction;Neuromuscular education;Patient/family education;Balance training;Cognitive remediation/compensation;Therapeutic activities    OT Goals(Current goals can be found in the care plan section) Acute Rehab OT Goals Patient Stated Goal: to get stronger OT Goal Formulation: With patient Time For Goal Achievement: 04/01/19 Potential to Achieve Goals: Good ADL Goals Pt Will Perform Grooming: standing;with min assist Pt Will Perform Upper Body Dressing: with set-up;sitting Pt Will Perform Lower Body Dressing: with min assist;with adaptive equipment;sit to/from stand Pt Will Transfer to Toilet: with min assist;ambulating;bedside commode Pt Will Perform Toileting - Clothing Manipulation and hygiene: with min assist;sit to/from stand Additional ADL Goal #1: Pt will perform bed mobility with supervision in preparation for ADL.  OT Frequency: Min 3X/week   Barriers to D/C: Decreased caregiver support          Co-evaluation   Reason for Co-Treatment: For patient/therapist safety PT goals addressed during session: Mobility/safety with mobility;Balance OT goals addressed during session: ADL's and self-care;Proper use of Adaptive equipment and DME      AM-PAC OT "6 Clicks" Daily Activity     Outcome Measure Help from another person eating meals?: A Little Help from another person taking care of personal grooming?: A Little Help from another person toileting, which includes using toliet, bedpan, or urinal?: Total Help from another person bathing (including washing, rinsing, drying)?: A Lot Help from another person to put on and taking off regular upper body clothing?: A Little Help from another person to put on and taking off regular lower body clothing?: Total 6 Click Score: 13   End of Session Nurse Communication: Mobility  status  Activity Tolerance: Patient tolerated treatment well Patient left: in chair;with call bell/phone within reach;with  chair alarm set  OT Visit Diagnosis: Unsteadiness on feet (R26.81);Other abnormalities of gait and mobility (R26.89);Other symptoms and signs involving cognitive function;Pain;Muscle weakness (generalized) (M62.81);History of falling (Z91.81)                Time: 2993-7169 OT Time Calculation (min): 24 min Charges:  OT General Charges $OT Visit: 1 Visit OT Evaluation $OT Eval Moderate Complexity: 1 Mod  Nestor Lewandowsky, OTR/L Acute Rehabilitation Services Pager: [phone] Office: [phone]  Malka So 03/18/2019, 4:05 PM

## 2019-03-18 NOTE — Evaluation (Signed)
Physical Therapy Evaluation Patient Details Name: Chad Fuller MRN: [id number] DOB: [birth year] Today's Date: 03/18/2019   History of Present Illness  Chad Fuller is a 85 y.o. male with medical history significant of stroke, diabetes mellitus, COPD, GERD, CAD, stent placement, lung cancer in remission (partial lumpectomy), BPH, remote PE, dCHF, GI bleeding, hard of hearing, who presented to ER with left leg weakness, difficulty speaking, worsening left facial droop, fall, generalized weakness. History of CVA 10/2004. MRI revealed Patchy multifocal ischemic infarcts involving the bilateral cerebral hemispheres  Clinical Impression  Orders received for PT evaluation. Patient demonstrates deficits in functional mobility as indicated below. Will benefit from continued skilled PT to address deficits and maximize function. Will see as indicated and progress as tolerated.  At this time, patient requires 2 person physical assist, demonstrates deficits in strength and coordination. Given deficits and history of multiple falls, do not feel patient is safe to d/c home alone. Recommend CIR consult.    Follow Up Recommendations CIR    Equipment Recommendations  None recommended by PT    Recommendations for Other Services       Precautions / Restrictions Precautions Precautions: Fall      Mobility  Bed Mobility Overal bed mobility: Needs Assistance Bed Mobility: Supine to Sit     Supine to sit: Mod assist     General bed mobility comments: moderate assist to move to EOB with multi modal cues for hand placement and positioning to pull to sit with bed rail.  Transfers Overall transfer level: Needs assistance Equipment used: Rolling walker (2 wheeled) Transfers: Sit to/from [facility] Sit to Stand: Mod assist;Max assist;+2 physical assistance(+2 mod from bed, +2 max from [facility]) Stand pivot transfers: Mod assist;+2 physical assistance       General transfer comment:  Increased time and effort to power to standing, pivot to [facility] with face to face 2 person assist, sit <> stand from [facility] for hygiene using RW, Max assist and cues for hand placement  Ambulation/Gait             General Gait Details: unable to perform  Stairs            Wheelchair Mobility    Modified Rankin (Stroke Patients Only) Modified Rankin (Stroke Patients Only) Pre-Morbid Rankin Score: Moderate disability Modified Rankin: Severe disability     Balance Overall balance assessment: Needs assistance;History of Falls Sitting-balance support: Feet supported Sitting balance-Leahy Scale: Fair Sitting balance - Comments: able to sit EOB unsupported    Standing balance support: Bilateral upper extremity supported;During functional activity Standing balance-Leahy Scale: Poor                               Pertinent Vitals/Pain Pain Assessment: Faces Faces Pain Scale: Hurts even more Pain Location: bilateral feet and left ankle Pain Descriptors / Indicators: Aching Pain Intervention(s): Limited activity within patient's tolerance;Monitored during session    Home Living Family/patient expects to be discharged to:: Private residence Living Arrangements: Alone Available Help at Discharge: Family;Available PRN/intermittently Type of Home: House Home Access: Stairs to enter Entrance Stairs-Rails: Right Entrance Stairs-Number of Steps: 4 Home Layout: One level Home Equipment: Walker - 2 wheels;Cane - single point;Shower seat;Bedside commode      Prior Function Level of Independence: Independent with assistive device(s)         Comments: patient still drives     Hand Dominance   Dominant Hand: Right  Extremity/Trunk Assessment   Upper Extremity Assessment Upper Extremity Assessment: RUE deficits/detail;Generalized weakness;LUE deficits/detail RUE Deficits / Details: overall weakness noted, modest assymetrical weakness R weaker than left RUE  Coordination: decreased fine motor;decreased gross motor    Lower Extremity Assessment Lower Extremity Assessment: Generalized weakness       Communication   Communication: HOH  Cognition Arousal/Alertness: Awake/alert Behavior During Therapy: WFL for tasks assessed/performed Overall Cognitive Status: No family/caregiver present to determine baseline cognitive functioning Area of Impairment: Awareness;Problem solving;Following commands                       Following Commands: Follows one step commands with increased time   Awareness: Emergent Problem Solving: Slow processing;Requires tactile cues;Requires verbal cues        General Comments General comments (skin integrity, edema, etc.): noted bilateral foot lacerations on toes, L laceration larger. Noted skin tear left forarm.    Exercises     Assessment/Plan    PT Assessment Patient needs continued PT services  PT Problem List Decreased strength;Decreased activity tolerance;Decreased balance;Decreased mobility;Decreased coordination;Decreased cognition;Decreased safety awareness;Pain       PT Treatment Interventions DME instruction;Gait training;Stair training;Functional mobility training;Therapeutic activities;Therapeutic exercise;Balance training;Cognitive remediation;Patient/family education;Neuromuscular re-education    PT Goals (Current goals can be found in the Care Plan section)  Acute Rehab PT Goals Patient Stated Goal: to get stronger PT Goal Formulation: With patient Time For Goal Achievement: 04/01/19 Potential to Achieve Goals: Fair    Frequency Min 3X/week   Barriers to discharge Decreased caregiver support      Co-evaluation PT/OT/SLP Co-Evaluation/Treatment: Yes Reason for Co-Treatment: Complexity of the patient's impairments (multi-system involvement);For patient/therapist safety;Necessary to address cognition/behavior during functional activity;To address functional/ADL transfers PT  goals addressed during session: Mobility/safety with mobility;Balance OT goals addressed during session: ADL's and self-care       AM-PAC PT "6 Clicks" Mobility  Outcome Measure Help needed turning from your back to your side while in a flat bed without using bedrails?: A Lot Help needed moving from lying on your back to sitting on the side of a flat bed without using bedrails?: A Lot Help needed moving to and from a bed to a chair (including a wheelchair)?: A Lot Help needed standing up from a chair using your arms (e.g., wheelchair or bedside chair)?: A Lot Help needed to walk in hospital room?: A Lot Help needed climbing 3-5 steps with a railing? : Total 6 Click Score: 11    End of Session Equipment Utilized During Treatment: Gait belt Activity Tolerance: Patient tolerated treatment well;Patient limited by pain Patient left: in chair;with call bell/phone within reach;with chair alarm set Nurse Communication: Mobility status PT Visit Diagnosis: Difficulty in walking, not elsewhere classified (R26.2);Repeated falls (R29.6)    Time: 3016-0109 PT Time Calculation (min) (ACUTE ONLY): 24 min   Charges:   PT Evaluation $PT Eval Moderate Complexity: 1 Mod          Alben Deeds, PT DPT  Board Certified Neurologic Specialist Acute Rehabilitation Services Pager [phone] Office [phone]   Duncan Dull 03/18/2019, 3:58 PM

## 2019-03-18 NOTE — Progress Notes (Signed)
PT Cancellation Note  Patient Details Name: Chad Fuller MRN: [id number] DOB: [birth year]   Cancelled Treatment:    Reason Eval/Treat Not Completed: Medical issues which prohibited therapy. Pt's troponin trending up. Will hold PT eval until trop trending down or cleared by cardiac.   Lorriane Shire 03/18/2019, 8:08 AM   Lorrin Goodell, PT  Office # [phone] Pager [phone]

## 2019-03-18 NOTE — Progress Notes (Signed)
OT Cancellation Note  Patient Details Name: Chad Fuller MRN: [id number] DOB: [birth year]   Cancelled Treatment:    Reason Eval/Treat Not Completed: Patient not medically ready.  Pt troponin trending up, will follow and initiate OT eval when medically appropriate (levels trending down per protocol) and able.   Delight Stare, OT Acute Rehabilitation Services Pager [phone] Office [phone]    Delight Stare 03/18/2019, 7:19 AM

## 2019-03-19 ENCOUNTER — Observation Stay (HOSPITAL_BASED_OUTPATIENT_CLINIC_OR_DEPARTMENT_OTHER): Payer: Medicare Other

## 2019-03-19 DIAGNOSIS — R41841 Cognitive communication deficit: Secondary | ICD-10-CM | POA: Diagnosis not present

## 2019-03-19 DIAGNOSIS — I248 Other forms of acute ischemic heart disease: Secondary | ICD-10-CM | POA: Diagnosis present

## 2019-03-19 DIAGNOSIS — I69391 Dysphagia following cerebral infarction: Secondary | ICD-10-CM | POA: Diagnosis not present

## 2019-03-19 DIAGNOSIS — R29898 Other symptoms and signs involving the musculoskeletal system: Secondary | ICD-10-CM | POA: Diagnosis not present

## 2019-03-19 DIAGNOSIS — I6340 Cerebral infarction due to embolism of unspecified cerebral artery: Secondary | ICD-10-CM | POA: Diagnosis not present

## 2019-03-19 DIAGNOSIS — G8194 Hemiplegia, unspecified affecting left nondominant side: Secondary | ICD-10-CM | POA: Diagnosis present

## 2019-03-19 DIAGNOSIS — R1312 Dysphagia, oropharyngeal phase: Secondary | ICD-10-CM | POA: Diagnosis not present

## 2019-03-19 DIAGNOSIS — I6389 Other cerebral infarction: Secondary | ICD-10-CM | POA: Diagnosis not present

## 2019-03-19 DIAGNOSIS — I5032 Chronic diastolic (congestive) heart failure: Secondary | ICD-10-CM | POA: Diagnosis present

## 2019-03-19 DIAGNOSIS — K219 Gastro-esophageal reflux disease without esophagitis: Secondary | ICD-10-CM | POA: Diagnosis present

## 2019-03-19 DIAGNOSIS — W19XXXA Unspecified fall, initial encounter: Secondary | ICD-10-CM | POA: Diagnosis present

## 2019-03-19 DIAGNOSIS — E785 Hyperlipidemia, unspecified: Secondary | ICD-10-CM | POA: Diagnosis present

## 2019-03-19 DIAGNOSIS — E876 Hypokalemia: Secondary | ICD-10-CM | POA: Diagnosis not present

## 2019-03-19 DIAGNOSIS — Y92012 Bathroom of single-family (private) house as the place of occurrence of the external cause: Secondary | ICD-10-CM | POA: Diagnosis not present

## 2019-03-19 DIAGNOSIS — R32 Unspecified urinary incontinence: Secondary | ICD-10-CM | POA: Diagnosis present

## 2019-03-19 DIAGNOSIS — E860 Dehydration: Secondary | ICD-10-CM | POA: Diagnosis present

## 2019-03-19 DIAGNOSIS — R279 Unspecified lack of coordination: Secondary | ICD-10-CM | POA: Diagnosis not present

## 2019-03-19 DIAGNOSIS — I110 Hypertensive heart disease with heart failure: Secondary | ICD-10-CM | POA: Diagnosis present

## 2019-03-19 DIAGNOSIS — I639 Cerebral infarction, unspecified: Secondary | ICD-10-CM

## 2019-03-19 DIAGNOSIS — H9190 Unspecified hearing loss, unspecified ear: Secondary | ICD-10-CM | POA: Diagnosis present

## 2019-03-19 DIAGNOSIS — R4701 Aphasia: Secondary | ICD-10-CM | POA: Diagnosis present

## 2019-03-19 DIAGNOSIS — K5900 Constipation, unspecified: Secondary | ICD-10-CM | POA: Diagnosis not present

## 2019-03-19 DIAGNOSIS — G459 Transient cerebral ischemic attack, unspecified: Secondary | ICD-10-CM | POA: Diagnosis not present

## 2019-03-19 DIAGNOSIS — J449 Chronic obstructive pulmonary disease, unspecified: Secondary | ICD-10-CM | POA: Diagnosis present

## 2019-03-19 DIAGNOSIS — L89620 Pressure ulcer of left heel, unstageable: Secondary | ICD-10-CM | POA: Diagnosis not present

## 2019-03-19 DIAGNOSIS — I6381 Other cerebral infarction due to occlusion or stenosis of small artery: Secondary | ICD-10-CM | POA: Diagnosis present

## 2019-03-19 DIAGNOSIS — Z743 Need for continuous supervision: Secondary | ICD-10-CM | POA: Diagnosis not present

## 2019-03-19 DIAGNOSIS — E119 Type 2 diabetes mellitus without complications: Secondary | ICD-10-CM | POA: Diagnosis not present

## 2019-03-19 DIAGNOSIS — N179 Acute kidney failure, unspecified: Secondary | ICD-10-CM | POA: Diagnosis not present

## 2019-03-19 DIAGNOSIS — Z66 Do not resuscitate: Secondary | ICD-10-CM | POA: Diagnosis present

## 2019-03-19 DIAGNOSIS — L988 Other specified disorders of the skin and subcutaneous tissue: Secondary | ICD-10-CM | POA: Diagnosis present

## 2019-03-19 DIAGNOSIS — Z85118 Personal history of other malignant neoplasm of bronchus and lung: Secondary | ICD-10-CM | POA: Diagnosis not present

## 2019-03-19 DIAGNOSIS — I69354 Hemiplegia and hemiparesis following cerebral infarction affecting left non-dominant side: Secondary | ICD-10-CM | POA: Diagnosis not present

## 2019-03-19 DIAGNOSIS — Y939 Activity, unspecified: Secondary | ICD-10-CM | POA: Diagnosis not present

## 2019-03-19 DIAGNOSIS — R7989 Other specified abnormal findings of blood chemistry: Secondary | ICD-10-CM | POA: Diagnosis not present

## 2019-03-19 DIAGNOSIS — S91205A Unspecified open wound of left lesser toe(s) with damage to nail, initial encounter: Secondary | ICD-10-CM | POA: Diagnosis present

## 2019-03-19 DIAGNOSIS — D649 Anemia, unspecified: Secondary | ICD-10-CM | POA: Diagnosis present

## 2019-03-19 DIAGNOSIS — R29810 Facial weakness: Secondary | ICD-10-CM | POA: Diagnosis present

## 2019-03-19 DIAGNOSIS — I2510 Atherosclerotic heart disease of native coronary artery without angina pectoris: Secondary | ICD-10-CM | POA: Diagnosis not present

## 2019-03-19 DIAGNOSIS — E1151 Type 2 diabetes mellitus with diabetic peripheral angiopathy without gangrene: Secondary | ICD-10-CM | POA: Diagnosis present

## 2019-03-19 DIAGNOSIS — I10 Essential (primary) hypertension: Secondary | ICD-10-CM | POA: Diagnosis not present

## 2019-03-19 DIAGNOSIS — M6281 Muscle weakness (generalized): Secondary | ICD-10-CM | POA: Diagnosis not present

## 2019-03-19 DIAGNOSIS — N400 Enlarged prostate without lower urinary tract symptoms: Secondary | ICD-10-CM | POA: Diagnosis present

## 2019-03-19 DIAGNOSIS — R2681 Unsteadiness on feet: Secondary | ICD-10-CM | POA: Diagnosis not present

## 2019-03-19 DIAGNOSIS — R278 Other lack of coordination: Secondary | ICD-10-CM | POA: Diagnosis not present

## 2019-03-19 LAB — CBC
HCT: 30.7 % — ABNORMAL LOW (ref 39.0–52.0)
Hemoglobin: 10.9 g/dL — ABNORMAL LOW (ref 13.0–17.0)
MCH: 32.3 pg (ref 26.0–34.0)
MCHC: 35.5 g/dL (ref 30.0–36.0)
MCV: 91.1 fL (ref 80.0–100.0)
Platelets: 100 10*3/uL — ABNORMAL LOW (ref 150–400)
RBC: 3.37 MIL/uL — AB (ref 4.22–5.81)
RDW: 12.3 % (ref 11.5–15.5)
WBC: 5.4 10*3/uL (ref 4.0–10.5)
nRBC: 0 % (ref 0.0–0.2)

## 2019-03-19 LAB — GLUCOSE, CAPILLARY
GLUCOSE-CAPILLARY: 187 mg/dL — AB (ref 70–99)
Glucose-Capillary: 220 mg/dL — ABNORMAL HIGH (ref 70–99)
Glucose-Capillary: 190 mg/dL — ABNORMAL HIGH (ref 70–99)
Glucose-Capillary: 194 mg/dL — ABNORMAL HIGH (ref 70–99)

## 2019-03-19 LAB — COMPREHENSIVE METABOLIC PANEL
ALT: 11 U/L (ref 0–44)
AST: 20 U/L (ref 15–41)
Albumin: 2.8 g/dL — ABNORMAL LOW (ref 3.5–5.0)
Alkaline Phosphatase: 84 U/L (ref 38–126)
Anion gap: 8 (ref 5–15)
BUN: 16 mg/dL (ref 8–23)
CO2: 26 mmol/L (ref 22–32)
Calcium: 8.5 mg/dL — ABNORMAL LOW (ref 8.9–10.3)
Chloride: 100 mmol/L (ref 98–111)
Creatinine, Ser: 0.87 mg/dL (ref 0.61–1.24)
Glucose, Bld: 181 mg/dL — ABNORMAL HIGH (ref 70–99)
Potassium: 3.4 mmol/L — ABNORMAL LOW (ref 3.5–5.1)
Sodium: 134 mmol/L — ABNORMAL LOW (ref 135–145)
Total Bilirubin: 1 mg/dL (ref 0.3–1.2)
Total Protein: 5.6 g/dL — ABNORMAL LOW (ref 6.5–8.1)

## 2019-03-19 LAB — ECHOCARDIOGRAM COMPLETE
Height: 75 in
Weight: 3160.51 [oz_av]

## 2019-03-19 LAB — URINE CULTURE: Culture: NO GROWTH

## 2019-03-19 MED ORDER — POTASSIUM CHLORIDE CRYS ER 20 MEQ PO TBCR
40.0000 meq | EXTENDED_RELEASE_TABLET | Freq: Once | ORAL | Status: AC
  Administered 2019-03-19: 40 meq via ORAL
  Filled 2019-03-19: qty 2

## 2019-03-19 MED ORDER — PERFLUTREN LIPID MICROSPHERE
1.0000 mL | INTRAVENOUS | Status: AC | PRN
  Administered 2019-03-19: 5 mL via INTRAVENOUS
  Filled 2019-03-19 (×3): qty 10

## 2019-03-19 NOTE — Progress Notes (Signed)
  Speech Language Pathology Treatment: Dysphagia  Patient Details Name: Chad Fuller MRN: [id number] DOB: March 19, 1926 Today's Date: 03/19/2019 Time: 1200-1230 SLP Time Calculation (min) (ACUTE ONLY): 30 min  Assessment / Plan / Recommendation Clinical Impression  Pt was seen during lunch to assess tolerance of the current diet. He consumed a meal of roast beef, a baked potato, green beans, apple sauce, and nectar thick liquids. Pt exhibited throat clearing twice with consecutive swallows of nectar thick liquids but otherwise tolerated all solids and liquids without overt s/sx of aspiration. Mastication time was within functional limits and mild oral residue was cleared with dry swallows or liquid washes. It is recommended that the current diet of regular texture solids and thin liquids be continued. Pt's daughter was present throughout the session and she was educated regarding the results of the modified barium swallow study, the rationale for diet recommendations, and the treatment plan. She verbalized understanding as well as agreement with all areas of education as well as plan of care. SLP will continue to follow for dysphagia treatment.    HPI HPI:  Chad Fuller is a 85 y.o. male with medical history significant of stroke, diabetes mellitus, COPD, GERD, CAD, stent placement, lung cancer in remission (partial lumpectomy), BPH, remote PE, dCHF, GI bleeding, hard of hearing, who presented to ER with left leg weakness, difficulty speaking, worsening left facial droop, fall, generalized weakness. 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 Discharged from [facility] with mechanical soft diet and nectar thickened liquids. MRI shows patchy multifocal ischemic infarcts involving the bilateral cerebral hemispheres.      SLP Plan  Continue with current plan of care       Recommendations  Diet recommendations: Regular;Nectar-thick liquid Liquids provided via: Cup;No straw Medication  Administration: Whole meds with puree Supervision: Patient able to self feed Compensations: Slow rate;Small sips/bites Postural Changes and/or Swallow Maneuvers: Seated upright 90 degrees;Upright 30-60 min after meal                Oral Care Recommendations: Oral care BID Follow up Recommendations: Inpatient Rehab SLP Visit Diagnosis: Dysphagia, oropharyngeal phase (R13.12) Plan: Continue with current plan of care       Elliona Doddridge I. Hardin Negus, [place], [place] Office number [phone] Pager [phone]               Horton Marshall 03/19/2019, 1:22 PM

## 2019-03-19 NOTE — Progress Notes (Signed)
[facility] PROGRESS NOTE  Chad Fuller JTT:[id number] DOB: 06/07/1926 DOA: 03/17/2019  PCP: Lajean Manes, MD  Brief History/Interval Summary: 85 y.o. male with medical history significant of stroke, diabetes mellitus, COPD, GERD, CAD, stent placement, lung cancer in remission (partial lumpectomy), BPH, remote PE, dCHF, GI bleeding, hard of hearing, who presented with left leg weakness, difficulty speaking, worsening left facial droop, fall, generalized weakness.  MRI raise concern for stroke.  Patient was hospitalized for further management.  Reason for Visit: Acute stroke  Consultants: Neurology  Procedures: Transthoracic echocardiogram is pending  Antibiotics: None  Subjective/Interval History: Patient states that he is able to move his legs more than yesterday.  He denies any chest pain shortness of breath.  No headaches.  He does however complain of pain in his left foot and ankle.  His daughter is at the bedside.  ROS: Denies any nausea or vomiting  Objective:  Vital Signs  Vitals:   03/18/19 2031 03/18/19 2342 03/19/19 0402 03/19/19 0740  BP: 121/77 116/68 133/74 115/72  Pulse: 76 78 77 78  Resp:   (!) 22 20  Temp: 98 F (36.7 C) 98 F (36.7 C) 98.2 F (36.8 C) 98 F (36.7 C)  TempSrc: Oral Oral Oral Oral  SpO2: 98% 96% 97% 96%  Weight:      Height:        Intake/Output Summary (Last 24 hours) at 03/19/2019 0926 Last data filed at 03/18/2019 1500 Gross per 24 hour  Intake 601.67 ml  Output --  Net 601.67 ml   Filed Weights   03/18/19 1154  Weight: 89.6 kg   General appearance: Awake alert.  In no distress.  Mildly distracted. Resp: Clear to auscultation bilaterally.  Normal effort Cardio: S1-S2 is normal regular.  No S3-S4.  No rubs murmurs or bruit GI: Abdomen is soft.  Nontender nondistended.  Bowel sounds are present normal.  No masses organomegaly Extremities: No obvious deformity noted to the ankle.  He is able to move his ankle  without any difficulty.  He does have that blackish discoloration of the left second toe.  Unclear how long he has had it.  Both his feet are warm to touch.  He does have poor pulses which is known as baseline. Neurologic: He is alert.  Oriented to place person.  No obvious facial asymmetry.  He is able to move his legs more than he was yesterday.   Lab Results:  Data Reviewed: I have personally reviewed following labs and imaging studies  CBC: Recent Labs  Lab 03/17/19 2025 03/19/19 0526  WBC 6.0 5.4  NEUTROABS 4.2  --   HGB 12.7* 10.9*  HCT 36.7* 30.7*  MCV 92.7 91.1  PLT PLATELET CLUMPS NOTED ON SMEAR, UNABLE TO ESTIMATE 100*    Basic Metabolic Panel: Recent Labs  Lab 03/17/19 2025 03/18/19 0709 03/19/19 0526  NA 132*  --  134*  K 3.3*  --  3.4*  CL 97*  --  100  CO2 26  --  26  GLUCOSE 249*  --  181*  BUN 28*  --  16  CREATININE 1.25*  --  0.87  CALCIUM 8.8*  --  8.5*  MG  --  1.8  --     GFR: Estimated Creatinine Clearance: 67.4 mL/min (by C-G formula based on SCr of 0.87 mg/dL).  Liver Function Tests: Recent Labs  Lab 03/17/19 2025 03/19/19 0526  AST 27 20  ALT 14 11  ALKPHOS 93 84  BILITOT  1.3* 1.0  PROT 6.2* 5.6*  ALBUMIN 3.3* 2.8*     Cardiac Enzymes: Recent Labs  Lab 03/17/19 2042 03/17/19 2316 03/18/19 0041 03/18/19 0709 03/18/19 1142  CKTOTAL  --  150  --   --   --   TROPONINI 0.08*  --  0.10* 0.15* 0.13*     HbA1C: Recent Labs    03/18/19 0307  HGBA1C 9.7*    CBG: Recent Labs  Lab 03/18/19 0747 03/18/19 1228 03/18/19 1652 03/18/19 2117 03/19/19 0736  GLUCAP 183* 230* 141* 141* 220*    Lipid Profile: Recent Labs    03/18/19 0307  CHOL 107  HDL 33*  LDLCALC 61  TRIG 67  CHOLHDL 3.2     Radiology Studies: Dg Chest 2 View  Result Date: 03/17/2019 CLINICAL DATA:  Weakness EXAM: CHEST - 2 VIEW COMPARISON:  02/03/2018, 11/18/2014 FINDINGS: Ankylosis of the spine. No pleural effusion. Stable nodule in the right  mid to upper lung. No acute consolidation. Stable cardiomediastinal silhouette with aortic atherosclerosis. No pneumothorax. IMPRESSION: No active cardiopulmonary disease. Electronically Signed   By: Donavan Foil M.D.   On: 03/17/2019 22:46   Dg Pelvis 1-2 Views  Result Date: 03/17/2019 CLINICAL DATA:  Weakness, fall EXAM: PELVIS - 1-2 VIEW COMPARISON:  None. FINDINGS: SI joints are non widened. Pubic symphysis and rami are intact. No fracture or malalignment. IMPRESSION: No acute osseous abnormality. Electronically Signed   By: Donavan Foil M.D.   On: 03/17/2019 22:47   Ct Head Wo Contrast  Result Date: 03/17/2019 CLINICAL DATA:  Head trauma EXAM: CT HEAD WITHOUT CONTRAST TECHNIQUE: Contiguous axial images were obtained from the base of the skull through the vertex without intravenous contrast. COMPARISON:  MRI 11/19/2014, CT brain 11/18/2014 FINDINGS: Brain: No acute territorial infarction, hemorrhage or intracranial mass. Marked atrophy. Small vessel ischemic changes of the white matter. Chronic lacunar infarct within the right basal ganglia. Prominent ventricles, felt secondary to atrophy Vascular: No hyperdense vessels.  Carotid vascular calcification Skull: Normal. Negative for fracture or focal lesion. Sinuses/Orbits: No acute finding. Other: None IMPRESSION: 1. No CT evidence for acute intracranial abnormality. 2. Atrophy and mild small vessel ischemic changes of the white matter Electronically Signed   By: Donavan Foil M.D.   On: 03/17/2019 22:45   Chad Fuller MV Contrast  Result Date: 03/18/2019 CLINICAL DATA:  Initial evaluation for left leg weakness, difficulty speaking, worsening left facial droop, weakness. EXAM: MRI HEAD WITHOUT CONTRAST MRA HEAD WITHOUT CONTRAST MRA NECK WITHOUT AND WITH CONTRAST TECHNIQUE: Multiplanar, multiecho pulse sequences of the brain and surrounding structures were obtained without intravenous contrast. Angiographic images of the Circle of Willis were obtained  using MRA technique without intravenous contrast. Angiographic images of the neck were obtained using MRA technique without and with intravenous contrast. Carotid stenosis measurements (when applicable) are obtained utilizing NASCET criteria, using the distal internal carotid diameter as the denominator. COMPARISON:  Prior CT from 03/17/2019 FINDINGS: MRI HEAD FINDINGS Diffuse prominence of the CSF containing spaces compatible with generalized age-related cerebral atrophy. Mild T2/FLAIR hyperintensity within the periventricular white matter, most like related chronic micro vessel ischemic disease. Few small remote lacunar infarcts noted within the bilateral basal ganglia. Additional chronic lacunar infarct noted within the left ventral pons. Few additional small remote bilateral cerebellar infarcts noted. Scattered multifocal foci of restricted diffusion seen involving the bilateral cerebral hemispheres, consistent with acute ischemic infarcts. The most prominent of these positioned at the right basal ganglia/corona radiata and measures 18 mm (series  5, image 78). Remainder of the infarcts predominantly involve the deep and subcortical white matter, and are subcentimeter in size. No associated hemorrhage or mass effect. No acute infratentorial ischemia. No mass lesion, midline shift or mass effect. No hydrocephalus. No extra-axial fluid collection. Pituitary gland suprasellar region normal. Major intracranial vascular flow voids maintained. Intracranial dolichoectasia noted. Atlantooccipital assimilation. Craniocervical junction widely patent. Degenerative changes noted within the upper cervical spine with resultant mild to moderate spinal stenosis at C3-4. Bone marrow signal intensity normal. No scalp soft tissue abnormality. Patient status post bilateral ocular lens replacement. Globes and orbital soft tissues demonstrate no acute finding. Paranasal sinuses are clear. No mastoid effusion. Inner ear structures  grossly normal. MRA HEAD FINDINGS ANTERIOR CIRCULATION: Examination mildly degraded by motion artifact. Visualized distal cervical segments of the internal carotid arteries are patent with symmetric antegrade flow. Petrous, cavernous, and supraclinoid segments patent without hemodynamically significant stenosis. Origin of the ophthalmic arteries patent. ICA termini well perfused. A1 segments patent bilaterally. Normal anterior communicating artery. Anterior cerebral arteries tortuous but patent to their distal aspects without stenosis. Right M1 widely patent. Left M1 patent proximally, but smoothly tapers distally. Superimposed short-segment moderate to severe distal left M1 stenosis just prior to the bifurcation (series 1028, image 12). Distal MCA branches perfused and symmetric. Scattered distal small vessel atheromatous irregularity. POSTERIOR CIRCULATION: Vertebral arteries patent to the vertebrobasilar junction without flow-limiting stenosis. Right vertebral artery slightly dominant. Posterior inferior cerebral arteries not seen. Left V4 segment tortuous. Basilar somewhat ectatic and tortuous but widely patent to its distal aspect without stenosis. Possible short-segment fenestration noted within the distal basilar artery. Superior cerebral arteries patent bilaterally. Left PCA supplied via the basilar. Right PCA supplied via a small right P1 as well as a prominent right posterior communicating artery. PCAs well perfused to their distal aspects without flow-limiting stenosis. MRA NECK FINDINGS Source images reviewed. Patent antegrade flow seen within the carotid and vertebral arteries bilaterally on time-of-flight sequence. Visualized aortic arch of normal caliber with normal branch pattern. No hemodynamically significant stenosis about the origin of the great vessels. Visualized subclavian arteries widely patent. Right common carotid artery patent from its origin to the bifurcation without stenosis. No  significant atheromatous narrowing about the right bifurcation. Right ICA widely patent distally to the skull base without stenosis or vascular occlusion. Left common carotid artery patent from its origin to the bifurcation without stenosis. No significant atheromatous narrowing about the left bifurcation. Left ICA patent distally to the skull base without stenosis or occlusion. Both of the vertebral arteries arise from the subclavian arteries. Right vertebral artery dominant. Vertebral arteries tortuous bilaterally. Dominant right vertebral artery widely patent. Short-segment moderate diffuse narrowing at the mid left V2 segment (series 1085, image 11), suspected to be related to extrinsic compression from uncovertebral disease. Left vertebral otherwise widely patent. IMPRESSION: MRI HEAD IMPRESSION: 1. Patchy multifocal ischemic infarcts involving the bilateral cerebral hemispheres as above, most prominent of which measures 18 mm involving the right basal ganglia. No associated hemorrhage or mass effect. A central thromboembolic etiology felt to be likely given the various vascular distributions involved. 2. Underlying atrophy with mild chronic microvascular ischemic disease, with a few scatter remote infarcts involving the bilateral basal ganglia, pons, and bilateral cerebellar hemispheres. MRA HEAD IMPRESSION: 1. Negative intracranial MRA for large vessel occlusion. 2. Single short-segment moderate to severe distal left M1 stenosis. No other hemodynamically significant or correctable stenosis identified. 3. 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 MRA NECK IMPRESSION: 1. Wide patency  of the common and internal carotid arteries bilaterally. 2. Single focal moderate stenosis within the mid left V2 segment. Left vertebral arteries otherwise widely patent within the neck. Right vertebral artery dominant. Electronically Signed   By: Jeannine Boga M.D.   On: 03/18/2019 07:28   Chad Fuller  Neck W Wo Contrast  Result Date: 03/18/2019 CLINICAL DATA:  Initial evaluation for left leg weakness, difficulty speaking, worsening left facial droop, weakness. EXAM: MRI HEAD WITHOUT CONTRAST MRA HEAD WITHOUT CONTRAST MRA NECK WITHOUT AND WITH CONTRAST TECHNIQUE: Multiplanar, multiecho pulse sequences of the brain and surrounding structures were obtained without intravenous contrast. Angiographic images of the Circle of Willis were obtained using MRA technique without intravenous contrast. Angiographic images of the neck were obtained using MRA technique without and with intravenous contrast. Carotid stenosis measurements (when applicable) are obtained utilizing NASCET criteria, using the distal internal carotid diameter as the denominator. COMPARISON:  Prior CT from 03/17/2019 FINDINGS: MRI HEAD FINDINGS Diffuse prominence of the CSF containing spaces compatible with generalized age-related cerebral atrophy. Mild T2/FLAIR hyperintensity within the periventricular white matter, most like related chronic micro vessel ischemic disease. Few small remote lacunar infarcts noted within the bilateral basal ganglia. Additional chronic lacunar infarct noted within the left ventral pons. Few additional small remote bilateral cerebellar infarcts noted. Scattered multifocal foci of restricted diffusion seen involving the bilateral cerebral hemispheres, consistent with acute ischemic infarcts. The most prominent of these positioned at the right basal ganglia/corona radiata and measures 18 mm (series 5, image 78). Remainder of the infarcts predominantly involve the deep and subcortical white matter, and are subcentimeter in size. No associated hemorrhage or mass effect. No acute infratentorial ischemia. No mass lesion, midline shift or mass effect. No hydrocephalus. No extra-axial fluid collection. Pituitary gland suprasellar region normal. Major intracranial vascular flow voids maintained. Intracranial dolichoectasia noted.  Atlantooccipital assimilation. Craniocervical junction widely patent. Degenerative changes noted within the upper cervical spine with resultant mild to moderate spinal stenosis at C3-4. Bone marrow signal intensity normal. No scalp soft tissue abnormality. Patient status post bilateral ocular lens replacement. Globes and orbital soft tissues demonstrate no acute finding. Paranasal sinuses are clear. No mastoid effusion. Inner ear structures grossly normal. MRA HEAD FINDINGS ANTERIOR CIRCULATION: Examination mildly degraded by motion artifact. Visualized distal cervical segments of the internal carotid arteries are patent with symmetric antegrade flow. Petrous, cavernous, and supraclinoid segments patent without hemodynamically significant stenosis. Origin of the ophthalmic arteries patent. ICA termini well perfused. A1 segments patent bilaterally. Normal anterior communicating artery. Anterior cerebral arteries tortuous but patent to their distal aspects without stenosis. Right M1 widely patent. Left M1 patent proximally, but smoothly tapers distally. Superimposed short-segment moderate to severe distal left M1 stenosis just prior to the bifurcation (series 1028, image 12). Distal MCA branches perfused and symmetric. Scattered distal small vessel atheromatous irregularity. POSTERIOR CIRCULATION: Vertebral arteries patent to the vertebrobasilar junction without flow-limiting stenosis. Right vertebral artery slightly dominant. Posterior inferior cerebral arteries not seen. Left V4 segment tortuous. Basilar somewhat ectatic and tortuous but widely patent to its distal aspect without stenosis. Possible short-segment fenestration noted within the distal basilar artery. Superior cerebral arteries patent bilaterally. Left PCA supplied via the basilar. Right PCA supplied via a small right P1 as well as a prominent right posterior communicating artery. PCAs well perfused to their distal aspects without flow-limiting  stenosis. MRA NECK FINDINGS Source images reviewed. Patent antegrade flow seen within the carotid and vertebral arteries bilaterally on time-of-flight sequence. Visualized aortic arch of normal caliber  with normal branch pattern. No hemodynamically significant stenosis about the origin of the great vessels. Visualized subclavian arteries widely patent. Right common carotid artery patent from its origin to the bifurcation without stenosis. No significant atheromatous narrowing about the right bifurcation. Right ICA widely patent distally to the skull base without stenosis or vascular occlusion. Left common carotid artery patent from its origin to the bifurcation without stenosis. No significant atheromatous narrowing about the left bifurcation. Left ICA patent distally to the skull base without stenosis or occlusion. Both of the vertebral arteries arise from the subclavian arteries. Right vertebral artery dominant. Vertebral arteries tortuous bilaterally. Dominant right vertebral artery widely patent. Short-segment moderate diffuse narrowing at the mid left V2 segment (series 1085, image 11), suspected to be related to extrinsic compression from uncovertebral disease. Left vertebral otherwise widely patent. IMPRESSION: MRI HEAD IMPRESSION: 1. Patchy multifocal ischemic infarcts involving the bilateral cerebral hemispheres as above, most prominent of which measures 18 mm involving the right basal ganglia. No associated hemorrhage or mass effect. A central thromboembolic etiology felt to be likely given the various vascular distributions involved. 2. Underlying atrophy with mild chronic microvascular ischemic disease, with a few scatter remote infarcts involving the bilateral basal ganglia, pons, and bilateral cerebellar hemispheres. MRA HEAD IMPRESSION: 1. Negative intracranial MRA for large vessel occlusion. 2. Single short-segment moderate to severe distal left M1 stenosis. No other hemodynamically significant or  correctable stenosis identified. 3. 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 MRA NECK IMPRESSION: 1. Wide patency of the common and internal carotid arteries bilaterally. 2. Single focal moderate stenosis within the mid left V2 segment. Left vertebral arteries otherwise widely patent within the neck. Right vertebral artery dominant. Electronically Signed   By: Jeannine Boga M.D.   On: 03/18/2019 07:28   Chad Brain Wo Contrast  Result Date: 03/18/2019 CLINICAL DATA:  Initial evaluation for left leg weakness, difficulty speaking, worsening left facial droop, weakness. EXAM: MRI HEAD WITHOUT CONTRAST MRA HEAD WITHOUT CONTRAST MRA NECK WITHOUT AND WITH CONTRAST TECHNIQUE: Multiplanar, multiecho pulse sequences of the brain and surrounding structures were obtained without intravenous contrast. Angiographic images of the Circle of Willis were obtained using MRA technique without intravenous contrast. Angiographic images of the neck were obtained using MRA technique without and with intravenous contrast. Carotid stenosis measurements (when applicable) are obtained utilizing NASCET criteria, using the distal internal carotid diameter as the denominator. COMPARISON:  Prior CT from 03/17/2019 FINDINGS: MRI HEAD FINDINGS Diffuse prominence of the CSF containing spaces compatible with generalized age-related cerebral atrophy. Mild T2/FLAIR hyperintensity within the periventricular white matter, most like related chronic micro vessel ischemic disease. Few small remote lacunar infarcts noted within the bilateral basal ganglia. Additional chronic lacunar infarct noted within the left ventral pons. Few additional small remote bilateral cerebellar infarcts noted. Scattered multifocal foci of restricted diffusion seen involving the bilateral cerebral hemispheres, consistent with acute ischemic infarcts. The most prominent of these positioned at the right basal ganglia/corona radiata and measures 18 mm  (series 5, image 78). Remainder of the infarcts predominantly involve the deep and subcortical white matter, and are subcentimeter in size. No associated hemorrhage or mass effect. No acute infratentorial ischemia. No mass lesion, midline shift or mass effect. No hydrocephalus. No extra-axial fluid collection. Pituitary gland suprasellar region normal. Major intracranial vascular flow voids maintained. Intracranial dolichoectasia noted. Atlantooccipital assimilation. Craniocervical junction widely patent. Degenerative changes noted within the upper cervical spine with resultant mild to moderate spinal stenosis at C3-4. Bone marrow signal intensity normal. No  scalp soft tissue abnormality. Patient status post bilateral ocular lens replacement. Globes and orbital soft tissues demonstrate no acute finding. Paranasal sinuses are clear. No mastoid effusion. Inner ear structures grossly normal. MRA HEAD FINDINGS ANTERIOR CIRCULATION: Examination mildly degraded by motion artifact. Visualized distal cervical segments of the internal carotid arteries are patent with symmetric antegrade flow. Petrous, cavernous, and supraclinoid segments patent without hemodynamically significant stenosis. Origin of the ophthalmic arteries patent. ICA termini well perfused. A1 segments patent bilaterally. Normal anterior communicating artery. Anterior cerebral arteries tortuous but patent to their distal aspects without stenosis. Right M1 widely patent. Left M1 patent proximally, but smoothly tapers distally. Superimposed short-segment moderate to severe distal left M1 stenosis just prior to the bifurcation (series 1028, image 12). Distal MCA branches perfused and symmetric. Scattered distal small vessel atheromatous irregularity. POSTERIOR CIRCULATION: Vertebral arteries patent to the vertebrobasilar junction without flow-limiting stenosis. Right vertebral artery slightly dominant. Posterior inferior cerebral arteries not seen. Left V4  segment tortuous. Basilar somewhat ectatic and tortuous but widely patent to its distal aspect without stenosis. Possible short-segment fenestration noted within the distal basilar artery. Superior cerebral arteries patent bilaterally. Left PCA supplied via the basilar. Right PCA supplied via a small right P1 as well as a prominent right posterior communicating artery. PCAs well perfused to their distal aspects without flow-limiting stenosis. MRA NECK FINDINGS Source images reviewed. Patent antegrade flow seen within the carotid and vertebral arteries bilaterally on time-of-flight sequence. Visualized aortic arch of normal caliber with normal branch pattern. No hemodynamically significant stenosis about the origin of the great vessels. Visualized subclavian arteries widely patent. Right common carotid artery patent from its origin to the bifurcation without stenosis. No significant atheromatous narrowing about the right bifurcation. Right ICA widely patent distally to the skull base without stenosis or vascular occlusion. Left common carotid artery patent from its origin to the bifurcation without stenosis. No significant atheromatous narrowing about the left bifurcation. Left ICA patent distally to the skull base without stenosis or occlusion. Both of the vertebral arteries arise from the subclavian arteries. Right vertebral artery dominant. Vertebral arteries tortuous bilaterally. Dominant right vertebral artery widely patent. Short-segment moderate diffuse narrowing at the mid left V2 segment (series 1085, image 11), suspected to be related to extrinsic compression from uncovertebral disease. Left vertebral otherwise widely patent. IMPRESSION: MRI HEAD IMPRESSION: 1. Patchy multifocal ischemic infarcts involving the bilateral cerebral hemispheres as above, most prominent of which measures 18 mm involving the right basal ganglia. No associated hemorrhage or mass effect. A central thromboembolic etiology felt to be  likely given the various vascular distributions involved. 2. Underlying atrophy with mild chronic microvascular ischemic disease, with a few scatter remote infarcts involving the bilateral basal ganglia, pons, and bilateral cerebellar hemispheres. MRA HEAD IMPRESSION: 1. Negative intracranial MRA for large vessel occlusion. 2. Single short-segment moderate to severe distal left M1 stenosis. No other hemodynamically significant or correctable stenosis identified. 3. 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 MRA NECK IMPRESSION: 1. Wide patency of the common and internal carotid arteries bilaterally. 2. Single focal moderate stenosis within the mid left V2 segment. Left vertebral arteries otherwise widely patent within the neck. Right vertebral artery dominant. Electronically Signed   By: Jeannine Boga M.D.   On: 03/18/2019 07:28   Dg Foot 2 Views Left  Result Date: 03/17/2019 CLINICAL DATA:  Fall EXAM: LEFT FOOT - 2 VIEW COMPARISON:  09/26/2015 FINDINGS: Bones appear osteopenic. No acute displaced fracture or malalignment. Mild joint space narrowing at the first  MTP joint. Mild narrowing at the PIP and DIP joints. IMPRESSION: No acute osseous abnormality Electronically Signed   By: Donavan Foil M.D.   On: 03/17/2019 22:49   Dg Swallowing Func-speech Pathology  Result Date: 03/18/2019 Objective Swallowing Evaluation: Type of Study: MBS-Modified Barium Swallow Study  Patient Details Name: Chad Fuller MRN: [id number] Date of Birth: April 20, 1926 Today's Date: 03/18/2019 Time: SLP Start Time (ACUTE ONLY): 3 -SLP Stop Time (ACUTE ONLY): 1055 SLP Time Calculation (min) (ACUTE ONLY): 25 min Past Medical History: Past Medical History: Diagnosis Date  CAD (coronary artery disease)   Cancer ([place])   lung  Diabetes mellitus   History of blood clots   Prostate enlargement   Pulmonary embolus [facility]) july 2005 Past Surgical History: Past Surgical History: Procedure Laterality Date   COLONOSCOPY N/A 07/24/2015  Procedure: COLONOSCOPY;  Surgeon: Laurence Spates, MD;  Location: [place];  Service: Endoscopy;  Laterality: N/A;  CORONARY STENT PLACEMENT    ESOPHAGOGASTRODUODENOSCOPY N/A 07/24/2015  Procedure: ESOPHAGOGASTRODUODENOSCOPY (EGD);  Surgeon: Laurence Spates, MD;  Location: [facility] ENDOSCOPY;  Service: Endoscopy;  Laterality: N/A;  bleeding source not in colon; EGD performed to locate source  FILTERING PROCEDURE    reports filter placed after knee surgery for blood clots  I&D EXTREMITY  08/16/2012  Procedure: MINOR IRRIGATION AND DEBRIDEMENT EXTREMITY;  Surgeon: Tennis Must, MD;  Location: [place];  Service: Orthopedics;  Laterality: Right;  KNEE SURGERY    LUNG REMOVAL, PARTIAL   HPI:  Chad Fuller is a 85 y.o. male with medical history significant of stroke, diabetes mellitus, COPD, GERD, CAD, stent placement, lung cancer in remission (partial lumpectomy), BPH, remote PE, dCHF, GI bleeding, hard of hearing, who presented to ER with left leg weakness, difficulty speaking, worsening left facial droop, fall, generalized weakness. 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 Discharged from [facility] with mechanical soft diet and nectar thickened liquids. MRI shows patchy multifocal ischemic infarcts involving the bilateral cerebral hemispheres.  Subjective: alert, cooperative Assessment / Plan / Recommendation CHL IP CLINICAL IMPRESSIONS 03/18/2019 Clinical Impression Patient hospitalized with CVA and Dysphagia from previous CVA in 2015. Patient presents with mild to moderate oropharyngeal dsyphagia. Oral phase characterized by poor bolus formation with all liquids, poor tongue elevation and propulsion of bolus, spillage to pyriform sinuses on both thin liquids and nectar thickened liquids (delayed swallow). Penetration and aspiration on larger thin liquid cup sips during swallow with no sensation. Inability to clear with a cued cough. Mild pharyngeal coating  with liquids. Swallow triggered with solids as bolus passes tip of epiglottis. Complete epiglottis inversion and laryngeal closure. Adequate UES opening with no pyriform residue. Esophageal sweep showed good clearance. Recommend Regular diet with nectar thickened liquds, medications to be given in whole in puree. Patient to sit at 90 degrees for all meals. Speech therapy to follow up dysphagia therapy, diet tolerance and upgrades. Patient seemingly understood diet recommendation and aspiration precautions, despite his age and recent CVA. Nurse notified of diet and aspiration precautions.  SLP Visit Diagnosis Dysphagia, oropharyngeal phase (R13.12) Attention and concentration deficit following -- Frontal lobe and executive function deficit following -- Impact on safety and function Moderate aspiration risk   CHL IP TREATMENT RECOMMENDATION 03/18/2019 Treatment Recommendations F/U MBS in --- days (Comment);Defer treatment plan to f/u with SLP   Prognosis 03/18/2019 Prognosis for Safe Diet Advancement Good Barriers to Reach Goals -- Barriers/Prognosis Comment -- CHL IP DIET RECOMMENDATION 03/18/2019 SLP Diet Recommendations Regular solids;Nectar thick liquid Liquid Administration via  Cup;No straw Medication Administration Whole meds with puree Compensations Slow rate;Small sips/bites Postural Changes Seated upright at 90 degrees   CHL IP OTHER RECOMMENDATIONS 03/18/2019 Recommended Consults -- Oral Care Recommendations Oral care BID Other Recommendations Order thickener from pharmacy   CHL IP FOLLOW UP RECOMMENDATIONS 11/20/2014 Follow up Recommendations Inpatient Rehab   CHL IP FREQUENCY AND DURATION 03/18/2019 Speech Therapy Frequency (ACUTE ONLY) min 2x/week Treatment Duration 2 weeks      CHL IP ORAL PHASE 03/18/2019 Oral Phase Impaired Oral - Pudding Teaspoon -- Oral - Pudding Cup -- Oral - Honey Teaspoon -- Oral - Honey Cup -- Oral - Nectar Teaspoon -- Oral - Nectar Cup Weak lingual manipulation;Incomplete tongue to  palate contact;Reduced posterior propulsion Oral - Nectar Straw -- Oral - Thin Teaspoon -- Oral - Thin Cup Weak lingual manipulation;Incomplete tongue to palate contact;Reduced posterior propulsion Oral - Thin Straw -- Oral - Puree Incomplete tongue to palate contact;Reduced posterior propulsion Oral - Mech Soft Weak lingual manipulation;Incomplete tongue to palate contact;Reduced posterior propulsion Oral - Regular -- Oral - Multi-Consistency -- Oral - Pill -- Oral Phase - Comment --  CHL IP PHARYNGEAL PHASE 03/18/2019 Pharyngeal Phase Impaired Pharyngeal- Pudding Teaspoon Delayed swallow initiation-vallecula Pharyngeal -- Pharyngeal- Pudding Cup -- Pharyngeal -- Pharyngeal- Honey Teaspoon -- Pharyngeal -- Pharyngeal- Honey Cup -- Pharyngeal -- Pharyngeal- Nectar Teaspoon -- Pharyngeal -- Pharyngeal- Nectar Cup Delayed swallow initiation-pyriform sinuses Pharyngeal -- Pharyngeal- Nectar Straw -- Pharyngeal -- Pharyngeal- Thin Teaspoon -- Pharyngeal -- Pharyngeal- Thin Cup Delayed swallow initiation-pyriform sinuses;Penetration/Aspiration during swallow Pharyngeal -- Pharyngeal- Thin Straw -- Pharyngeal -- Pharyngeal- Puree -- Pharyngeal -- Pharyngeal- Mechanical Soft Delayed swallow initiation-vallecula Pharyngeal -- Pharyngeal- Regular -- Pharyngeal -- Pharyngeal- Multi-consistency -- Pharyngeal -- Pharyngeal- Pill -- Pharyngeal -- Pharyngeal Comment --  CHL IP CERVICAL ESOPHAGEAL PHASE 03/18/2019 Cervical Esophageal Phase WFL Pudding Teaspoon -- Pudding Cup -- Honey Teaspoon -- Honey Cup -- Nectar Teaspoon -- Nectar Cup -- Nectar Straw -- Thin Teaspoon -- Thin Cup -- Thin Straw -- Puree -- Mechanical Soft -- Regular -- Multi-consistency -- Pill -- Cervical Esophageal Comment -- Charlynne Cousins Ward 03/18/2019, 11:17 AM                Medications:  Scheduled:  alfuzosin  10 mg Oral Q breakfast   aspirin  325 mg Oral Daily   ezetimibe  10 mg Oral Daily   finasteride  5 mg Oral Daily   insulin aspart  0-9 Units  Subcutaneous TID WC   insulin glargine  30 Units Subcutaneous Daily   multivitamin with minerals  1 tablet Oral Daily   potassium chloride  40 mEq Oral Once   Continuous:  sodium chloride 50 mL/hr at 03/19/19 0457   PXT:GGYIRSWNIOEVO **OR** acetaminophen (TYLENOL) oral liquid 160 mg/5 mL **OR** acetaminophen, hydrALAZINE, nitroGLYCERIN, ondansetron (ZOFRAN) IV, perflutren lipid microspheres (DEFINITY) IV suspension, polyethylene glycol, senna-docusate    Assessment/Plan:  Acute stroke/left leg weakness MRI brain showed patchy multifocal ischemic infarcts involving the bilateral cerebral hemispheres.  Neurology is following and consulting.  MRA neck did not show any significant stenosis.  Echocardiogram is pending.  LDL is 61.  HbA1c 9.7.  PT and OT is recommended inpatient rehab consultation.  This has been ordered.  History of coronary artery disease with mildly elevated troponin EKG did not show any ischemic changes. Mild leak of troponin likely due to other acute illness and possible demand ischemia.  We will see what the echocardiogram reveals.  No need to involve cardiology at this  time.  Continue aspirin statin.  Not noted to be on beta-blocker at home.  Unknown as to the type of coronary artery disease and what interventions have been performed previously.  No cardiology visits noted in the EMR.  Denies any chest pain.  Still waiting on echocardiogram.  Diabetes mellitus type 2 without any complications JHE1D 9.7.  Monitor CBGs.  SSI.  Continue lower dose of Lantus.  Normocytic anemia Mild drop in hemoglobin is likely dilutional.  No evidence of overt bleeding.  Check anemia panel.  History of GERD PPI  History of BPH Continue home medications.  Chronic diastolic CHF Echocardiogram from 2019 showed a EF of 60% with grade 1 diastolic dysfunction.  Seems to be euvolemic currently.  His Lasix is on hold.    History of fall PT and OT evaluation.  No obvious injuries  identified.  Acute kidney injury with hypokalemia BUN and creatinine noted to be higher than his baseline.  Baseline creatinine 0.6 however this was from 2016.  Holding his diuretics.  Avoid nephrotoxic agent.  Improved this morning.  Replace potassium.  Magnesium was 1.8.   Essential hypertension Hyzaar and furosemide are on hold.  Hyperlipidemia LDL at goal at 61.  Patient is on Zetia which will be continued.  Dark discoloration of the tip of the second toe on the left Could have peripheral artery disease.  Based on arterial Dopplers done in 2017 his he had near normal ABIs.  He has very poor pulsations however no evidence for acute ischemia in the lower extremities.  This can be pursued in the outpatient setting.  DVT Prophylaxis: SCDs    Code Status: DNR Family Communication: Discussed with the patient.  No family at bedside Disposition Plan: Management as outlined above    LOS: 0 days   [facility] Pager on [email or web address]  03/19/2019, 9:26 AM

## 2019-03-19 NOTE — Progress Notes (Addendum)
  Echocardiogram 2D Echocardiogram  Limited with Definity and bubble study has been performed due to contact precautions.  Chad Fuller 03/19/2019, 11:42 AM

## 2019-03-19 NOTE — Consult Note (Addendum)
[facility] Nurse wound consult note Reason for Consult: left foot, second toe, avulsed nail  Wound type:trauma Pressure Injury POA: NA Measurement:n/a Wound bed:n/a Drainage (amount, consistency, odor) dried blood surrounds nail and stains foot Periwound:mild edema, no erythema Dressing procedure/placement/frequency: I have provided Nursing with conservative care orders for twice daily cleansing followed by painting the nail and toe tip with a betadine swabstick for its astringent and antimicrobial properties.  [facility] [facility] nursing team will not follow, but will remain available to this patient, the nursing and medical teams.  Please re-consult if needed. Thanks, Maudie Flakes, MSN, RN, [place], Arther Abbott  Pager# [phone]

## 2019-03-20 ENCOUNTER — Encounter (HOSPITAL_COMMUNITY)
Admission: EM | Disposition: A | Discharge status: Skilled Nursing Facility | Payer: Self-pay | Source: Home / Self Care | Attending: Internal Medicine

## 2019-03-20 DIAGNOSIS — I6389 Other cerebral infarction: Secondary | ICD-10-CM

## 2019-03-20 HISTORY — PX: LOOP RECORDER INSERTION: EP1214

## 2019-03-20 LAB — GLUCOSE, CAPILLARY
GLUCOSE-CAPILLARY: 207 mg/dL — AB (ref 70–99)
Glucose-Capillary: 134 mg/dL — ABNORMAL HIGH (ref 70–99)
Glucose-Capillary: 179 mg/dL — ABNORMAL HIGH (ref 70–99)
Glucose-Capillary: 183 mg/dL — ABNORMAL HIGH (ref 70–99)

## 2019-03-20 LAB — VITAMIN B12: Vitamin B-12: 411 pg/mL (ref 180–914)

## 2019-03-20 LAB — RETICULOCYTES
Immature Retic Fract: 5.6 % (ref 2.3–15.9)
RBC.: 3.73 MIL/uL — ABNORMAL LOW (ref 4.22–5.81)
Retic Count, Absolute: >30 10*3/uL (ref 19.0–186.0)
Retic Ct Pct: 1 % (ref 0.4–3.1)

## 2019-03-20 LAB — BASIC METABOLIC PANEL
Anion gap: 5 (ref 5–15)
BUN: 13 mg/dL (ref 8–23)
CO2: 26 mmol/L (ref 22–32)
Calcium: 8.3 mg/dL — ABNORMAL LOW (ref 8.9–10.3)
Chloride: 102 mmol/L (ref 98–111)
Creatinine, Ser: 0.85 mg/dL (ref 0.61–1.24)
GFR calc Af Amer: >60 mL/min (ref >60)
GFR calc non Af Amer: >60 mL/min (ref >60)
Glucose, Bld: 126 mg/dL — ABNORMAL HIGH (ref 70–99)
POTASSIUM: 3.8 mmol/L (ref 3.5–5.1)
Sodium: 133 mmol/L — ABNORMAL LOW (ref 135–145)

## 2019-03-20 LAB — CBC
HEMATOCRIT: 33.9 % — AB (ref 39.0–52.0)
Hemoglobin: 11.5 g/dL — ABNORMAL LOW (ref 13.0–17.0)
MCH: 30.8 pg (ref 26.0–34.0)
MCHC: 33.9 g/dL (ref 30.0–36.0)
MCV: 90.9 fL (ref 80.0–100.0)
Platelets: 124 10*3/uL — ABNORMAL LOW (ref 150–400)
RBC: 3.73 MIL/uL — ABNORMAL LOW (ref 4.22–5.81)
RDW: 12.4 % (ref 11.5–15.5)
WBC: 5.4 10*3/uL (ref 4.0–10.5)
nRBC: 0 % (ref 0.0–0.2)

## 2019-03-20 LAB — IRON AND TIBC
Iron: 98 ug/dL (ref 45–182)
Saturation Ratios: 56 % — ABNORMAL HIGH (ref 17.9–39.5)
TIBC: 176 ug/dL — ABNORMAL LOW (ref 250–450)
UIBC: 78 ug/dL

## 2019-03-20 LAB — FOLATE: Folate: 6.1 ng/mL (ref >5.9)

## 2019-03-20 LAB — FERRITIN: Ferritin: 139 ng/mL (ref 24–336)

## 2019-03-20 SURGERY — LOOP RECORDER INSERTION

## 2019-03-20 MED ORDER — ONDANSETRON HCL 4 MG/2ML IJ SOLN: 4.0000 mg | Freq: Four times a day (QID) | INTRAMUSCULAR | Status: DC | PRN

## 2019-03-20 MED ORDER — ACETAMINOPHEN 325 MG PO TABS: 325.0000 mg | ORAL_TABLET | ORAL | Status: DC | PRN

## 2019-03-20 MED ORDER — LIDOCAINE-EPINEPHRINE 1 %-1:100000 IJ SOLN
INTRAMUSCULAR | Status: DC | PRN
  Administered 2019-03-20: 20 mL

## 2019-03-20 MED ORDER — LIDOCAINE-EPINEPHRINE 1 %-1:100000 IJ SOLN
INTRAMUSCULAR | Status: AC
  Filled 2019-03-20: qty 1

## 2019-03-20 MED ORDER — HEPARIN SODIUM (PORCINE) 5000 UNIT/ML IJ SOLN
5000.0000 [IU] | Freq: Three times a day (TID) | INTRAMUSCULAR | Status: DC
  Administered 2019-03-20 – 2019-03-21 (×3): 5000 [IU] via SUBCUTANEOUS
  Filled 2019-03-20 (×3): qty 1

## 2019-03-20 MED FILL — Perflutren Lipid Microsphere IV Susp 1.1 MG/ML: INTRAVENOUS | Qty: 10 | Status: AC

## 2019-03-20 SURGICAL SUPPLY — 2 items
LOOP REVEAL LINQSYS (Prosthesis & Implant Heart) ×2 IMPLANT
PACK LOOP INSERTION (CUSTOM PROCEDURE TRAY) ×3 IMPLANT

## 2019-03-20 NOTE — Progress Notes (Signed)
Physical Therapy Treatment Patient Details Name: Chad Fuller MRN: [id number] DOB: 12-30-1925 Today's Date: 03/20/2019    History of Present Illness Chad Fuller is a 85 y.o. male with medical history significant of CVA, DM, COPD, GERD, CAD, stent placement, lung cancer in remission (partial lumpectomy), BPH, remote PE, dCHF, GI bleeding, HOH who presented to ER with left leg weakness, difficulty speaking, worsening left facial droop, fall, generalized weakness. History of CVA 10/2004. MRI revealed Patchy multifocal ischemic infarcts involving the bilateral cerebral hemispheres. s/p loop recorder placed 3/23.     PT Comments    Patient progressing slowly towards PT goals. Tolerated gait training today with Min A of 2 for balance/safety and close chair follow secondary to weakness and fatigue. Pt with impaired balance and posterior lean at times requiring min A to maintain upright. Pt with impaired memory. Motivated to maximize independence. Continues to be a high falls risk. Disposition updated to SNF as pt denied CIR. Will follow.    Follow Up Recommendations  SNF     Equipment Recommendations  None recommended by PT    Recommendations for Other Services       Precautions / Restrictions Precautions Precautions: Fall Precaution Comments: pt reports multiple falls Restrictions Weight Bearing Restrictions: No    Mobility  Bed Mobility Overal bed mobility: Needs Assistance Bed Mobility: Supine to Sit     Supine to sit: Min guard;HOB elevated     General bed mobility comments: Increased time and effort and use of rail but no physical assist needed. + dizziness.   Transfers Overall transfer level: Needs assistance Equipment used: Rolling walker (2 wheeled) Transfers: Sit to/from Stand Sit to Stand: Mod assist;+2 physical assistance;From elevated surface         General transfer comment: Assist of 2 to power to standing with use of momentum and cues for technique,  posterior lean a few times; stood from EOB x1, from chair x2.   Ambulation/Gait Ambulation/Gait assistance: Min assist;+2 safety/equipment Gait Distance (Feet): 10 Feet(+ 24') Assistive device: Rolling walker (2 wheeled) Gait Pattern/deviations: Step-through pattern;Trunk flexed;Narrow base of support;Leaning posteriorly Gait velocity: decreased   General Gait Details: Slow, unsteady gait with increased hip/knee flexion throughout; cues for upright posture and forward gaze. 1 seated rest break.   Stairs             Wheelchair Mobility    Modified Rankin (Stroke Patients Only) Modified Rankin (Stroke Patients Only) Pre-Morbid Rankin Score: Moderate disability Modified Rankin: Moderately severe disability     Balance Overall balance assessment: Needs assistance;History of Falls Sitting-balance support: Feet supported;No upper extremity supported Sitting balance-Leahy Scale: Fair Sitting balance - Comments: able to sit EOB unsupported    Standing balance support: Bilateral upper extremity supported;During functional activity Standing balance-Leahy Scale: Poor Standing balance comment: Requires external support in standing. Posterior lean. Marching in place with Min A.                             Cognition Arousal/Alertness: Awake/alert Behavior During Therapy: WFL for tasks assessed/performed Overall Cognitive Status: No family/caregiver present to determine baseline cognitive functioning Area of Impairment: Orientation                 Orientation Level: Disoriented to;Time("1971")     Following Commands: Follows one step commands with increased time;Follows multi-step commands inconsistently(and repetition)     Problem Solving: Slow processing;Requires verbal cues;Requires tactile cues  Exercises      General Comments        Pertinent Vitals/Pain Pain Assessment: No/denies pain    Home Living                      Prior  Function            PT Goals (current goals can now be found in the care plan section) Progress towards PT goals: Progressing toward goals    Frequency    Min 3X/week      PT Plan Discharge plan needs to be updated    Co-evaluation PT/OT/SLP Co-Evaluation/Treatment: Yes Reason for Co-Treatment: For patient/therapist safety;To address functional/ADL transfers PT goals addressed during session: Mobility/safety with mobility;Balance;Proper use of DME;Strengthening/ROM        AM-PAC PT "6 Clicks" Mobility   Outcome Measure  Help needed turning from your back to your side while in a flat bed without using bedrails?: A Little Help needed moving from lying on your back to sitting on the side of a flat bed without using bedrails?: A Little Help needed moving to and from a bed to a chair (including a wheelchair)?: A Little Help needed standing up from a chair using your arms (e.g., wheelchair or bedside chair)?: A Lot Help needed to walk in hospital room?: A Lot Help needed climbing 3-5 steps with a railing? : Total 6 Click Score: 14    End of Session Equipment Utilized During Treatment: Gait belt Activity Tolerance: Patient tolerated treatment well Patient left: in chair;with call bell/phone within reach;with chair alarm set;Other (comment)(Ot present in room) Nurse Communication: Mobility status PT Visit Diagnosis: Difficulty in walking, not elsewhere classified (R26.2);Repeated falls (R29.6)     Time: 7793-9688 PT Time Calculation (min) (ACUTE ONLY): 18 min  Charges:  $Gait Training: 8-22 mins                     Wray Kearns, PT, DPT Acute Rehabilitation Services Pager [phone] Office [phone]       [place] 03/20/2019, 1:28 PM

## 2019-03-20 NOTE — Progress Notes (Signed)
Updated family that [facility] has made a bed offer, they would like to go ahead and pursue [facility] as the SNF placement.   Pt should be able to DC to [facility] once medically stable.   CSW will continue to follow.   Domenic Schwab, MSW, [place]

## 2019-03-20 NOTE — Progress Notes (Addendum)
Loop recorder site assessed, scant blood noted and marked. No bruising or swelling noted.  11:35- incision site stable  1150- slight increase in blood around marked site  12:05- site stable  1220- site stable  1250- site stable  1320- site stable  1520- site stable

## 2019-03-20 NOTE — Consult Note (Addendum)
ELECTROPHYSIOLOGY CONSULT NOTE  Patient ID: JEFFERY BACHMEIER MRN: [id number], DOB/AGE: 02/06/1926   Admit date: 03/17/2019 Date of Consult: 03/20/2019  Primary Physician: Lajean Manes, MD Primary Cardiologist: none Reason for Consultation: Cryptogenic stroke ; recommendations regarding Implantable Loop, requested by Dr. Erlinda Hong Recorder  History of Present Illness KAMILO [facility] was admitted on 03/17/2019 with L leg weaness, facial droop, dx w/bilateral stroke   PMHx includes: reports of CAD w/prior stent, DM, HTN, COPD, lung ca treated surgically, re,ote h/o PE, chronic CHF (diastolic), GERD, GIB  THE PATIENT DENIES CORONARY STENT, HE REPORTS WHAT SOUNDS LIKE AN IVC 83 YEARS AGO Neurology notes:Bilateral patchy multifocal ischemic infarcts - embolic - source unknown..  he has undergone workup for stroke including echocardiogram and carotid angio.  The patient has been monitored on telemetry which has demonstrated sinus rhythm with no arrhythmias.   Echocardiogram this admission demonstrated IMPRESSIONS  1. The left ventricle has hyperdynamic systolic function, with an ejection fraction of >65%. The cavity size was normal. Left ventricular diastolic Doppler parameters are consistent with impaired relaxation.  2. The right ventricle has normal systolc function. The cavity was normal. There is no increase in right ventricular wall thickness.  3. The mitral valve is degenerative. Mild thickening of the mitral valve leaflet. There is moderate mitral annular calcification present. Mild mitral valve stenosis.  4. Mean transmitral gradient is 57mmHg.  5. Aortic valve regurgitation was not assessed by color flow Doppler.  6. Pulmonic valve regurgitation was not assessed by color flow Doppler.  7. No intracardiac thrombi or masses were visualized. FINDINGS  Left Ventricle: The left ventricle has hyperdynamic systolic function, with an ejection fraction of >65%. The cavity size was normal. There is  no increase in left ventricular wall thickness. Left ventricular diastolic Doppler parameters are consistent  with impaired relaxation (grade I). Definity contrast agent was given IV to delineate the left ventricular endocardial borders. Right Ventricle: The right ventricle has normal systolic function. The cavity was normal. There is no increase in right ventricular wall thickness. Left Atrium: Left atrial size was normal in size. Right Atrium: Right atrial size was normal in size. Interatrial Septum: No atrial level shunt detected by color flow Doppler. Saline contrast bubble study was negative, with no evidence of any interatrial shunt. Pericardium: There is no evidence of pericardial effusion. Mitral Valve: The mitral valve is degenerative in appearance. Mild thickening of the mitral valve leaflet. There is moderate mitral annular calcification present. Mitral valve regurgitation is not visualized by color flow Doppler. Mild mitral valve  stenosis. Mean transmitral gradient is 36mmHg. Tricuspid Valve: Tricuspid valve regurgitation was not visualized by color flow Doppler. Aortic Valve: Aortic valve regurgitation was not assessed by color flow Doppler. Pulmonic Valve: Pulmonic valve regurgitation was not assessed by color flow Doppler. Venous: The inferior vena cava is normal in size with greater than 50% respiratory variability.   Lab work is reviewed.  Prior to admission, the patient denies chest pain, shortness of breath, dizziness, palpitations, or syncope.  He lives independently, cares for himself without difficulty.  He is recovering from their stroke with CIR disposition per PT/OT notes.   Past Medical History:  Diagnosis Date   CAD (coronary artery disease)    Cancer ([place])    lung   Diabetes mellitus    History of blood clots    Prostate enlargement    Pulmonary embolus [facility]) july 2005     Surgical History:  Past Surgical History:  Procedure Laterality  Date    COLONOSCOPY N/A 07/24/2015   Procedure: COLONOSCOPY;  Surgeon: Laurence Spates, MD;  Location: [facility] ENDOSCOPY;  Service: Endoscopy;  Laterality: N/A;   CORONARY STENT PLACEMENT     ESOPHAGOGASTRODUODENOSCOPY N/A 07/24/2015   Procedure: ESOPHAGOGASTRODUODENOSCOPY (EGD);  Surgeon: Laurence Spates, MD;  Location: [facility] ENDOSCOPY;  Service: Endoscopy;  Laterality: N/A;  bleeding source not in colon; EGD performed to locate source   FILTERING PROCEDURE     reports filter placed after knee surgery for blood clots   I&D EXTREMITY  08/16/2012   Procedure: MINOR IRRIGATION AND DEBRIDEMENT EXTREMITY;  Surgeon: Tennis Must, MD;  Location: [facility];  Service: Orthopedics;  Laterality: Right;   KNEE SURGERY     LUNG REMOVAL, PARTIAL       Medications Prior to Admission  Medication Sig Dispense Refill Last Dose   alfuzosin (UROXATRAL) 10 MG 24 hr tablet Take 10 mg by mouth daily with breakfast.    03/17/2019 at Unknown time   aspirin 81 MG tablet Take 81 mg by mouth daily.   03/17/2019 at Unknown time   Cholecalciferol (VITAMIN D) 50 MCG (2000 UT) CAPS Take 2,000 Units by mouth daily.   03/17/2019 at Unknown time   ezetimibe (ZETIA) 10 MG tablet Take 10 mg by mouth daily.   03/17/2019 at Unknown time   finasteride (PROSCAR) 5 MG tablet TAKE 1 TABLET BY MOUTH EVERY DAY   03/17/2019 at Unknown time   furosemide (LASIX) 40 MG tablet Take 40 mg by mouth 2 (two) times daily.  6 03/17/2019 at Unknown time   Glucosamine-Chondroitin 500-400 MG CAPS Take by mouth.   03/17/2019 at Unknown time   metFORMIN (GLUCOPHAGE-XR) 500 MG 24 hr tablet Take 500 mg with breakfast and 1000 mg with supper (Patient taking differently: Take 1,000 mg by mouth daily with breakfast. Taking 1000mg  (2 tabs) once daily with breakfast)   03/17/2019 at Unknown time   Multiple Vitamins-Minerals (MULTIVITAMINS THER. W/MINERALS) TABS tablet Take by mouth.   03/17/2019 at Unknown time   pantoprazole (PROTONIX) 40 MG tablet Take 1  tablet (40 mg total) by mouth daily.   03/17/2019 at Unknown time   polyethylene glycol (MIRALAX / GLYCOLAX) packet Take 17 g by mouth daily as needed for mild constipation.    unk   TRESIBA FLEXTOUCH 200 UNIT/ML SOPN Inject 40 Units into the skin daily.   3 03/17/2019 at Unknown time   vitamin B-12 (CYANOCOBALAMIN) 1000 MCG tablet Take 2,000 mcg by mouth daily.   03/17/2019 at Unknown time   ACCU-CHEK AVIVA PLUS test strip AS DIRECTED DX E11.65 THREE TIMES A DAY 90 DAYS  3 Taking   B-D ULTRAFINE III SHORT PEN 31G X 8 MM MISC USE ONCE A DAY WITH TRESIBA FLEXPEN  3 Taking   insulin glargine (LANTUS) 100 UNIT/ML injection Inject 0.5 mLs (50 Units total) into the skin daily. (Patient not taking: Reported on 03/18/2019) 10 mL 11 Not Taking at Unknown time   losartan-hydrochlorothiazide (HYZAAR) 50-12.5 MG per tablet Take 1 tablet by mouth daily.   Not Taking at Unknown time   meloxicam (MOBIC) 7.5 MG tablet Take 7.5 mg by mouth daily.   Not Taking at Unknown time   tamsulosin (FLOMAX) 0.4 MG CAPS capsule Take 0.4 mg by mouth daily.    Not Taking at Unknown time    Inpatient Medications:   alfuzosin  10 mg Oral Q breakfast   aspirin  325 mg Oral Daily   ezetimibe  10 mg  Oral Daily   finasteride  5 mg Oral Daily   insulin aspart  0-9 Units Subcutaneous TID WC   insulin glargine  30 Units Subcutaneous Daily   multivitamin with minerals  1 tablet Oral Daily    Allergies:  Allergies  Allergen Reactions   Pantoprazole Diarrhea   Protonix [Pantoprazole Sodium] Diarrhea    Social History   Socioeconomic History   Marital status: Widowed    Spouse name: Not on file   Number of children: Not on file   Years of education: Not on file   Highest education level: Not on file  Occupational History   Not on file  Social Needs   Financial resource strain: Not on file   Food insecurity:    Worry: Not on file    Inability: Not on file   Transportation needs:    Medical:  Not on file    Non-medical: Not on file  Tobacco Use   Smoking status: Former Smoker    Types: Cigarettes    Last attempt to quit: 07/28/1974    Years since quitting: 44.6   Smokeless tobacco: Never Used  Substance and Sexual Activity   Alcohol use: No   Drug use: No   Sexual activity: Not on file  Lifestyle   Physical activity:    Days per week: Not on file    Minutes per session: Not on file   Stress: Not on file  Relationships   Social connections:    Talks on phone: Not on file    Gets together: Not on file    Attends religious service: Not on file    Active member of club or organization: Not on file    Attends meetings of clubs or organizations: Not on file    Relationship status: Not on file   Intimate partner violence:    Fear of current or ex partner: Not on file    Emotionally abused: Not on file    Physically abused: Not on file    Forced sexual activity: Not on file  Other Topics Concern   Not on file  Social History Narrative   Not on file     Family History  Problem Relation Age of Onset   Diabetes Mellitus II Sister       Review of Systems: All other systems reviewed and are otherwise negative except as noted above.  Physical Exam: Vitals:   03/19/19 1610 03/19/19 2005 03/20/19 0011 03/20/19 0403  BP: 123/61  (!) 153/80 128/79  Pulse: 78 85 83 81  Resp: 20   18  Temp: 98.4 F (36.9 C) 97.9 F (36.6 C) 97.6 F (36.4 C) 97.8 F (36.6 C)  TempSrc: Oral Oral Oral Oral  SpO2: 96% 100% 99% 99%  Weight:      Height:        LIMITED EXAM GEN- The patient is well appearing, alert and oriented to self, situation, place, knows the president, not the year.   Head- normocephalic, atraumatic Eyes-  Sclera clear, conjunctiva pink Ears- hearing intact Neck- supple MS- no significant deformity or atrophy Skin- no rash or lesion Psych- euthymic mood, full affect   Labs:   Lab Results  Component Value Date   WBC 5.4 03/20/2019   HGB  11.5 (L) 03/20/2019   HCT 33.9 (L) 03/20/2019   MCV 90.9 03/20/2019   PLT 124 (L) 03/20/2019    Recent Labs  Lab 03/19/19 0526 03/20/19 0557  NA 134* 133*  K 3.4* 3.8  CL 100 102  CO2 26 26  BUN 16 13  CREATININE 0.87 0.85  CALCIUM 8.5* 8.3*  PROT 5.6*  --   BILITOT 1.0  --   ALKPHOS 84  --   ALT 11  --   AST 20  --   GLUCOSE 181* 126*   Lab Results  Component Value Date   CKTOTAL 150 03/17/2019   TROPONINI 0.13 (HH) 03/18/2019   Lab Results  Component Value Date   CHOL 107 03/18/2019   CHOL 120 11/19/2014   Lab Results  Component Value Date   HDL 33 (L) 03/18/2019   HDL 36 (L) 11/19/2014   Lab Results  Component Value Date   LDLCALC 61 03/18/2019   LDLCALC 70 11/19/2014   Lab Results  Component Value Date   TRIG 67 03/18/2019   TRIG 72 11/19/2014   Lab Results  Component Value Date   CHOLHDL 3.2 03/18/2019   CHOLHDL 3.3 11/19/2014   No results found for: LDLDIRECT  No results found for: DDIMER   Radiology/Studies:   Dg Chest 2 View Result Date: 03/17/2019 CLINICAL DATA:  Weakness EXAM: CHEST - 2 VIEW COMPARISON:  02/03/2018, 11/18/2014 FINDINGS: Ankylosis of the spine. No pleural effusion. Stable nodule in the right mid to upper lung. No acute consolidation. Stable cardiomediastinal silhouette with aortic atherosclerosis. No pneumothorax. IMPRESSION: No active cardiopulmonary disease. Electronically Signed   By: Donavan Foil M.D.   On: 03/17/2019 22:46    Ct Head Wo Contrast Result Date: 03/17/2019 CLINICAL DATA:  Head trauma EXAM: CT HEAD WITHOUT CONTRAST TECHNIQUE: Contiguous axial images were obtained from the base of the skull through the vertex without intravenous contrast. COMPARISON:  MRI 11/19/2014, CT brain 11/18/2014 FINDINGS: Brain: No acute territorial infarction, hemorrhage or intracranial mass. Marked atrophy. Small vessel ischemic changes of the white matter. Chronic lacunar infarct within the right basal ganglia. Prominent ventricles,  felt secondary to atrophy Vascular: No hyperdense vessels.  Carotid vascular calcification Skull: Normal. Negative for fracture or focal lesion. Sinuses/Orbits: No acute finding. Other: None IMPRESSION: 1. No CT evidence for acute intracranial abnormality. 2. Atrophy and mild small vessel ischemic changes of the white matter Electronically Signed   By: Donavan Foil M.D.   On: 03/17/2019 22:45    Mr Virgel Paling EE Contrast Result Date: 03/18/2019 CLINICAL DATA:  Initial evaluation for left leg weakness, difficulty speaking, worsening left facial droop, weakness. EXAM: MRI HEAD WITHOUT CONTRAST MRA HEAD WITHOUT CONTRAST MRA NECK WITHOUT AND WITH CONTRAST TECHNIQUE: Multiplanar, multiecho pulse sequences of the brain and surrounding structures were obtained without intravenous contrast. Angiographic images of the Circle of Willis were obtained using MRA technique without intravenous contrast. Angiographic images of the neck were obtained using MRA technique without and with intravenous contrast. Carotid stenosis measurements (when applicable) are obtained utilizing NASCET criteria, using the distal internal carotid diameter as the denominator. COMPARISON:  Prior CT from 03/17/2019 FINDINGS: MRI HEAD FINDINGS Diffuse prominence of the CSF containing spaces compatible with generalized age-related cerebral atrophy. Mild T2/FLAIR hyperintensity within the periventricular white matter, most like related chronic micro vessel ischemic disease. Few small remote lacunar infarcts noted within the bilateral basal ganglia. Additional chronic lacunar infarct noted within the left ventral pons. Few additional small remote bilateral cerebellar infarcts noted. Scattered multifocal foci of restricted diffusion seen involving the bilateral cerebral hemispheres, consistent with acute ischemic infarcts. The most prominent of these positioned at the right basal ganglia/corona radiata and measures 18 mm (series 5, image 78). Remainder of  the infarcts predominantly involve the deep and subcortical white matter, and are subcentimeter in size. No associated hemorrhage or mass effect. No acute infratentorial ischemia. No mass lesion, midline shift or mass effect. No hydrocephalus. No extra-axial fluid collection. Pituitary gland suprasellar region normal. Major intracranial vascular flow voids maintained. Intracranial dolichoectasia noted. Atlantooccipital assimilation. Craniocervical junction widely patent. Degenerative changes noted within the upper cervical spine with resultant mild to moderate spinal stenosis at C3-4. Bone marrow signal intensity normal. No scalp soft tissue abnormality. Patient status post bilateral ocular lens replacement. Globes and orbital soft tissues demonstrate no acute finding. Paranasal sinuses are clear. No mastoid effusion. Inner ear structures grossly normal. MRA HEAD FINDINGS ANTERIOR CIRCULATION: Examination mildly degraded by motion artifact. Visualized distal cervical segments of the internal carotid arteries are patent with symmetric antegrade flow. Petrous, cavernous, and supraclinoid segments patent without hemodynamically significant stenosis. Origin of the ophthalmic arteries patent. ICA termini well perfused. A1 segments patent bilaterally. Normal anterior communicating artery. Anterior cerebral arteries tortuous but patent to their distal aspects without stenosis. Right M1 widely patent. Left M1 patent proximally, but smoothly tapers distally. Superimposed short-segment moderate to severe distal left M1 stenosis just prior to the bifurcation (series 1028, image 12). Distal MCA branches perfused and symmetric. Scattered distal small vessel atheromatous irregularity. POSTERIOR CIRCULATION: Vertebral arteries patent to the vertebrobasilar junction without flow-limiting stenosis. Right vertebral artery slightly dominant. Posterior inferior cerebral arteries not seen. Left V4 segment tortuous. Basilar somewhat  ectatic and tortuous but widely patent to its distal aspect without stenosis. Possible short-segment fenestration noted within the distal basilar artery. Superior cerebral arteries patent bilaterally. Left PCA supplied via the basilar. Right PCA supplied via a small right P1 as well as a prominent right posterior communicating artery. PCAs well perfused to their distal aspects without flow-limiting stenosis. MRA NECK FINDINGS Source images reviewed. Patent antegrade flow seen within the carotid and vertebral arteries bilaterally on time-of-flight sequence. Visualized aortic arch of normal caliber with normal branch pattern. No hemodynamically significant stenosis about the origin of the great vessels. Visualized subclavian arteries widely patent. Right common carotid artery patent from its origin to the bifurcation without stenosis. No significant atheromatous narrowing about the right bifurcation. Right ICA widely patent distally to the skull base without stenosis or vascular occlusion. Left common carotid artery patent from its origin to the bifurcation without stenosis. No significant atheromatous narrowing about the left bifurcation. Left ICA patent distally to the skull base without stenosis or occlusion. Both of the vertebral arteries arise from the subclavian arteries. Right vertebral artery dominant. Vertebral arteries tortuous bilaterally. Dominant right vertebral artery widely patent. Short-segment moderate diffuse narrowing at the mid left V2 segment (series 1085, image 11), suspected to be related to extrinsic compression from uncovertebral disease. Left vertebral otherwise widely patent. IMPRESSION: MRI HEAD IMPRESSION: 1. Patchy multifocal ischemic infarcts involving the bilateral cerebral hemispheres as above, most prominent of which measures 18 mm involving the right basal ganglia. No associated hemorrhage or mass effect. A central thromboembolic etiology felt to be likely given the various vascular  distributions involved. 2. Underlying atrophy with mild chronic microvascular ischemic disease, with a few scatter remote infarcts involving the bilateral basal ganglia, pons, and bilateral cerebellar hemispheres. MRA HEAD IMPRESSION: 1. Negative intracranial MRA for large vessel occlusion. 2. Single short-segment moderate to severe distal left M1 stenosis. No other hemodynamically significant or correctable stenosis identified. 3. 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 MRA NECK IMPRESSION: 1. Wide patency of the common and internal  carotid arteries bilaterally. 2. Single focal moderate stenosis within the mid left V2 segment. Left vertebral arteries otherwise widely patent within the neck. Right vertebral artery dominant. Electronically Signed   By: Jeannine Boga M.D.   On: 03/18/2019 07:28      12-lead ECG SR All prior EKG's in EPIC reviewed with no documented atrial fibrillation  Telemetry SR  Assessment and Plan:  1. Cryptogenic stroke The patient presents with cryptogenic stroke.   I spoke at length with the patient about monitoring for afib with either a 30 day event monitor or an implantable loop recorder.  Risks, benefits, and alteratives to implantable loop recorder were discussed with the patient today.   At this time, the patient is very clear in his decision to proceed with implantable loop recorder.   Wound care was reviewed with the patient (keep incision clean and dry for 3 days).  Wound check scheduled for the patient  Please call with questions.   Renee Dyane Dustman, PA-C 03/20/2019  EP Attending  Patient seen and examined. Agree with the findings as noted above by Tommye Standard, PA-C. The patient has sustained a cryptogenic stroke and workup so far is unremarkable. I have recommended placement of an ILR. I reviewed the indications/risks/benefits/goals/expectations of ILR insertion with the patient and he wishes to proceed.  Mikle Bosworth.D.

## 2019-03-20 NOTE — Progress Notes (Signed)
OT Cancellation Note  Patient Details Name: Chad Fuller MRN: [id number] DOB: July 10, 1926   Cancelled Treatment:    Reason Eval/Treat Not Completed: Patient at procedure or test/ unavailable (cath lab); will follow up for OT treatment as schedule permits.  Lou Cal, OT [facility] Pager [phone] Office [phone]   Raymondo Band 03/20/2019, 10:59 AM

## 2019-03-20 NOTE — Discharge Instructions (Signed)
Implant site/wound care instructions °Keep incision clean and dry for 3 days. °You can remove outer dressing tomorrow. °Leave steri-strips (little pieces of tape) on until seen in the office for wound check appointment. °Call the office ([phone]) for redness, drainage, swelling, or fever. ° °

## 2019-03-20 NOTE — Progress Notes (Signed)
Inpatient Diabetes Program Recommendations  AACE/ADA: New Consensus Statement on Inpatient Glycemic Control (2015)  Target Ranges:  Prepandial:   less than 140 mg/dL      Peak postprandial:   less than 180 mg/dL (1-2 hours)      Critically ill patients:  140 - 180 mg/dL   Lab Results  Component Value Date   GLUCAP 179 (H) 03/20/2019   HGBA1C 9.7 (H) 03/18/2019    Review of Glycemic Control  Diabetes history: DM 2 Outpatient Diabetes medications: Tresiba 40 units Daily, Metformin 1000 mg BID Current orders for Inpatient glycemic control: Lantus 30 units, Novolog 0-9 units tid  Inpatient Diabetes Program Recommendations:    Spoke with patient about A1c level 9.7%. Discussed glucose and A1c goals. Patient reports he takes his insulin and Metformin consistently everyday. Patient said he monitors his glucose at home. Discussed importance of glycemic control outpatient to decrease risk of further stroke, cardiac, and other vascular events. Patient reports sees Dr. Felipa Eth outpatient for DM management, patient reports seeing him a couple of weeks ago. No changes were made at that time to his medication. Encouraged follow up with PCP for further insulin dosing. Patient d/cing to SNF.  Thanks,  Tama Headings RN, MSN, BC-ADM Inpatient Diabetes Coordinator Team Pager [phone] (8a-5p)

## 2019-03-20 NOTE — NC FL2 (Signed)
[place] LEVEL OF CARE SCREENING TOOL     IDENTIFICATION  Patient Name: Chad Fuller Birthdate: [birth year] Sex: male Admission Date (Current Location): 03/17/2019  [facility] and [place] Number:  Herbalist and Address:  [facility] [place], [place] [address] [place], [place] [address]      Provider Number: [id number]  Attending Physician Name and Address:  Bonnielee Haff, MD  Relative Name and Phone Number:  Aquilla Solian, Daughter, [phone]    Current Level of Care: Hospital Recommended Level of Care: [place] Prior Approval Number:    Date Approved/Denied: 11/19/14 PASRR Number: [id number]  Discharge Plan: SNF    Current Diagnoses: Patient Active Problem List   Diagnosis Date Noted  . HTN (hypertension) 03/18/2019  . HLD (hyperlipidemia) 03/18/2019  . Pressure injury of skin 03/18/2019  . Acute renal failure ([place])   . Diabetes mellitus without complication ([place]) 31/49/7026  . GERD (gastroesophageal reflux disease) 03/17/2019  . BPH (benign prostatic hyperplasia) 03/17/2019  . Chronic diastolic (congestive) heart failure ([place]) 03/17/2019  . Fall 03/17/2019  . Hypokalemia 03/17/2019  . AKI (acute kidney injury) ([place]) 03/17/2019  . Elevated troponin 03/17/2019  . Rectal bleeding 07/23/2015  . GI bleed 07/23/2015  . Hypotension 07/23/2015  . Left pontine CVA ([place]) 11/20/2014  . Dysphagia, post-stroke 11/20/2014  . Left leg weakness 11/19/2014  . Type 2 diabetes mellitus ([place]) 11/19/2014  . Aortic heart murmur 11/19/2014  . Non-small cell carcinoma of lung ([place]) 11/19/2014  . Recurrent falls 11/19/2014  . Dizziness and giddiness 11/19/2014  . Stroke (cerebrum) ([place]) 11/19/2014  . Right hemiparesis ([place])   . Type II or unspecified type diabetes mellitus with neurological manifestations, not stated as uncontrolled 11/14/2013  . Pain 11/14/2013  . Closed fracture of metatarsal bone(s) 10/10/2013  .  ASTHMA 02/23/2009  . CAD (coronary artery disease) 02/05/2009  . NEOPLASM, MALIGNANT, LUNG 02/04/2009  . DIABETES, TYPE 2 02/04/2009  . PULMONARY EMBOLISM 02/04/2009  . C O P D 02/04/2009  . DYSPNEA 02/04/2009    Orientation RESPIRATION BLADDER Height & Weight     Self, Situation, Place  Normal Incontinent, External catheter Weight: 197 lb 8.5 oz (89.6 kg) Height:  6\' 3"  (190.5 cm)  BEHAVIORAL SYMPTOMS/MOOD NEUROLOGICAL BOWEL NUTRITION STATUS      Continent Diet(heart healthy/carb mod, thin liquids)  AMBULATORY STATUS COMMUNICATION OF NEEDS Skin   Limited Assist Verbally Other (Comment)(MASD on groin; has pressure injury; abrasion on leg/toe)                       Personal Care Assistance Level of Assistance  Bathing, Feeding, Dressing, Total care Bathing Assistance: Limited assistance Feeding assistance: Limited assistance Dressing Assistance: Limited assistance Total Care Assistance: Limited assistance   Functional Limitations Info  Sight, Hearing, Speech Sight Info: Adequate Hearing Info: Adequate Speech Info: Adequate    SPECIAL CARE FACTORS FREQUENCY  PT (By licensed PT), OT (By licensed OT)     PT Frequency: 5x/wk OT Frequency: 5x/wk            Contractures Contractures Info: Not present    Additional Factors Info  Code Status, Allergies, Insulin Sliding Scale Code Status Info: DNR Allergies Info:  Pantoprazole, Protonix Pantoprazole Sodium   Insulin Sliding Scale Info: insulin aspart novolog 0-9 units 3x daily w/meals, insulin glargine (lantus) 30 units daily       Current Medications (03/20/2019):  This is the current hospital active medication list Current  Facility-Administered Medications  Medication Dose Route Frequency Provider Last Rate Last Dose  . 0.9 %  sodium chloride infusion   Intravenous Continuous Evans Lance, MD 50 mL/hr at 03/20/19 [phone]    . acetaminophen (TYLENOL) solution 650 mg  650 mg Per Tube Q4H PRN Evans Lance, MD        Or  . acetaminophen (TYLENOL) suppository 650 mg  650 mg Rectal Q4H PRN Evans Lance, MD      . acetaminophen (TYLENOL) tablet 325-650 mg  325-650 mg Oral Q4H PRN Evans Lance, MD      . alfuzosin (UROXATRAL) 24 hr tablet 10 mg  10 mg Oral Q breakfast Evans Lance, MD   10 mg at 03/20/19 0917  . aspirin tablet 325 mg  325 mg Oral Daily Evans Lance, MD   325 mg at 03/20/19 5859  . ezetimibe (ZETIA) tablet 10 mg  10 mg Oral Daily Evans Lance, MD   10 mg at 03/20/19 [phone]  . finasteride (PROSCAR) tablet 5 mg  5 mg Oral Daily Evans Lance, MD   5 mg at 03/20/19 4628  . heparin injection 5,000 Units  5,000 Units Subcutaneous Q8H Evans Lance, MD      . hydrALAZINE (APRESOLINE) injection 5 mg  5 mg Intravenous Q6H PRN Evans Lance, MD      . insulin aspart (novoLOG) injection 0-9 Units  0-9 Units Subcutaneous TID WC Evans Lance, MD   1 Units at 03/20/19 [phone]  . insulin glargine (LANTUS) injection 30 Units  30 Units Subcutaneous Daily Evans Lance, MD   30 Units at 03/20/19 [phone]  . multivitamin with minerals tablet 1 tablet  1 tablet Oral Daily Evans Lance, MD   1 tablet at 03/20/19 [phone]  . nitroGLYCERIN (NITROSTAT) SL tablet 0.4 mg  0.4 mg Sublingual Q5 min PRN Evans Lance, MD      . ondansetron [facility]) injection 4 mg  4 mg Intravenous Q6H PRN Evans Lance, MD      . polyethylene glycol (MIRALAX / GLYCOLAX) packet 17 g  17 g Oral Daily PRN Evans Lance, MD      . senna-docusate (Senokot-S) tablet 1 tablet  1 tablet Oral QHS PRN Evans Lance, MD         Discharge Medications: Please see discharge summary for a list of discharge medications.  Relevant Imaging Results:  Relevant Lab Results:   Additional Information SSI: [id number]  Philippa Chester Aria Pickrell, LCSWA

## 2019-03-20 NOTE — TOC Initial Note (Signed)
Transition of Care [facility]) - Initial/Assessment Note    Patient Details  Name: Chad Fuller MRN: [id number] Date of Birth: [birth year]  Transition of Care [facility]) CM/SW Contact:    Gelene Mink, [place] Phone Number: 03/20/2019, 12:32 PM  Clinical Narrative:     CSW called and spoke with the patients daughter, [name]. Patient is no longer a candidate for CIR. Patient's daughter is agreeable to skilled nursing. They would like [facility] [place]. CSW e-mailed over the CMS list. CSW explained that [facility] possibly might not have a bed available and encouraged the family to have 2-3 back up choices. Patient's daughter agreed and would be in touch with the other choices. She had no other questions or concerns.               Expected Discharge Plan: Skilled Nursing Facility Barriers to Discharge: Continued Medical Work up   Patient Goals and CMS Choice Patient states their goals for this hospitalization and ongoing recovery are:: Pt daughter wants her father to get better CMS Medicare.gov Compare Post Acute Care list provided to:: Patient Represenative (must comment)(Pt daughter [name]) Choice offered to / list presented to : Adult Children  Expected Discharge Plan and Services Expected Discharge Plan: [place] In-house Referral: NA Discharge Planning Services: NA Post Acute Care Choice: [place] Living arrangements for the past 2 months: Single Family Home                 DME Arranged: NIV DME Agency: NA HH Arranged: NA HH Agency: NA  Prior Living Arrangements/Services Living arrangements for the past 2 months: Single Family Home Lives with:: Self, Adult Children Patient language and need for interpreter reviewed:: No Do you feel safe going back to the place where you live?: Yes      Need for Family Participation in Patient Care: Yes (Comment) Care giver support system in place?: Yes (comment)   Criminal Activity/Legal Involvement Pertinent to  Current Situation/Hospitalization: No - Comment as needed  Activities of Daily Living      Permission Sought/Granted Permission sought to share information with : Case Manager Permission granted to share information with : Yes, Verbal Permission Granted  Share Information with NAME: [name]   Permission granted to share info w AGENCY: All SNF  Permission granted to share info w Relationship: Daughter  Permission granted to share info w Contact Information: [phone]  Emotional Assessment Appearance:: Appears stated age Attitude/Demeanor/Rapport: Unable to Assess Affect (typically observed): Unable to Assess Orientation: : Oriented to Self, Oriented to  Time, Oriented to Place Alcohol / Substance Use: Not Applicable Psych Involvement: No (comment)  Admission diagnosis:  TIA (transient ischemic attack) [G45.9] Hypokalemia [E87.6] Troponin level elevated [R79.89] Acute renal failure, unspecified acute renal failure type [facility]) [N17.9] Patient Active Problem List   Diagnosis Date Noted  . HTN (hypertension) 03/18/2019  . HLD (hyperlipidemia) 03/18/2019  . Pressure injury of skin 03/18/2019  . Acute renal failure ([place])   . Diabetes mellitus without complication ([place]) 83/15/1761  . GERD (gastroesophageal reflux disease) 03/17/2019  . BPH (benign prostatic hyperplasia) 03/17/2019  . Chronic diastolic (congestive) heart failure ([place]) 03/17/2019  . Fall 03/17/2019  . Hypokalemia 03/17/2019  . AKI (acute kidney injury) ([place]) 03/17/2019  . Elevated troponin 03/17/2019  . Rectal bleeding 07/23/2015  . GI bleed 07/23/2015  . Hypotension 07/23/2015  . Left pontine CVA ([place]) 11/20/2014  . Dysphagia, post-stroke 11/20/2014  . Left leg weakness 11/19/2014  . Type 2 diabetes mellitus ([place])  11/19/2014  . Aortic heart murmur 11/19/2014  . Non-small cell carcinoma of lung ([place]) 11/19/2014  . Recurrent falls 11/19/2014  . Dizziness and giddiness 11/19/2014  . Stroke (cerebrum) ([place])  11/19/2014  . Right hemiparesis ([place])   . Type II or unspecified type diabetes mellitus with neurological manifestations, not stated as uncontrolled 11/14/2013  . Pain 11/14/2013  . Closed fracture of metatarsal bone(s) 10/10/2013  . ASTHMA 02/23/2009  . CAD (coronary artery disease) 02/05/2009  . NEOPLASM, MALIGNANT, LUNG 02/04/2009  . DIABETES, TYPE 2 02/04/2009  . PULMONARY EMBOLISM 02/04/2009  . C O P D 02/04/2009  . DYSPNEA 02/04/2009   PCP:  Lajean Manes, MD Pharmacy:   [id number] [place], [place] [address] Phone: [phone] Fax: [phone]  [facility] [place] [place] [address] Phone: [phone] Fax: [phone]     Social Determinants of Health (SDOH) Interventions    Readmission Risk Interventions Readmission Risk Prevention Plan 03/20/2019  Transportation Screening Complete  PCP or Specialist Appt within 5-7 Days Complete  Home Care Screening Complete  Medication Review (RN CM) Complete  Some recent data might be hidden

## 2019-03-20 NOTE — Progress Notes (Signed)
STROKE TEAM PROGRESS NOTE   SUBJECTIVE (INTERVAL HISTORY) No family is at the bedside.  Pt sitting in chair, initially sleeping but easily arousable, orientated, stated generalized weakness, but no focal neuro deficit. Had loop recorder placed this am by EP.   OBJECTIVE Vitals:   03/20/19 1220 03/20/19 1232 03/20/19 1235 03/20/19 1250  BP: 139/70 103/60 115/62 (!) 94/57  Pulse:  86    Resp:  20    Temp:  97.8 F (36.6 C)  98.4 F (36.9 C)  TempSrc:  Oral  Oral  SpO2:  97%    Weight:      Height:        CBC:  Recent Labs  Lab 03/17/19 2025 03/19/19 0526 03/20/19 0557  WBC 6.0 5.4 5.4  NEUTROABS 4.2  --   --   HGB 12.7* 10.9* 11.5*  HCT 36.7* 30.7* 33.9*  MCV 92.7 91.1 90.9  PLT PLATELET CLUMPS NOTED ON SMEAR, UNABLE TO ESTIMATE 100* 124*    Basic Metabolic Panel:  Recent Labs  Lab 03/18/19 0709 03/19/19 0526 03/20/19 0557  NA  --  134* 133*  K  --  3.4* 3.8  CL  --  100 102  CO2  --  26 26  GLUCOSE  --  181* 126*  BUN  --  16 13  CREATININE  --  0.87 0.85  CALCIUM  --  8.5* 8.3*  MG 1.8  --   --     Lipid Panel:     Component Value Date/Time   CHOL 107 03/18/2019 0307   TRIG 67 03/18/2019 0307   HDL 33 (L) 03/18/2019 0307   CHOLHDL 3.2 03/18/2019 0307   VLDL 13 03/18/2019 0307   LDLCALC 61 03/18/2019 0307   HgbA1c:  Lab Results  Component Value Date   HGBA1C 9.7 (H) 03/18/2019   Urine Drug Screen:     Component Value Date/Time   LABOPIA NONE DETECTED 11/19/2014 0440   COCAINSCRNUR NONE DETECTED 11/19/2014 0440   LABBENZ NONE DETECTED 11/19/2014 0440   AMPHETMU NONE DETECTED 11/19/2014 0440   THCU NONE DETECTED 11/19/2014 0440   LABBARB NONE DETECTED 11/19/2014 0440    Alcohol Level No results found for: ETH  IMAGING  Ct Head Wo Contrast 03/17/2019 IMPRESSION:  1. No CT evidence for acute intracranial abnormality.  2. Atrophy and mild small vessel ischemic changes of the white matter   Mr [facility] Contrast Mr Jodene Nam Neck W Wo  Contrast Mr Brain Wo Contrast 03/18/2019 IMPRESSION:  MRI HEAD  IMPRESSION:  1. Patchy multifocal ischemic infarcts involving the bilateral cerebral hemispheres as above, most prominent of which measures 18 mm involving the right basal ganglia. No associated hemorrhage or mass effect. A central thromboembolic etiology felt to be likely given the various vascular distributions involved.  2. Underlying atrophy with mild chronic microvascular ischemic disease, with a few scatter remote infarcts involving the bilateral basal ganglia, pons, and bilateral cerebellar hemispheres.   MRA HEAD  IMPRESSION:  1. Negative intracranial MRA for large vessel occlusion.  2. Single short-segment moderate to severe distal left M1 stenosis. No other hemodynamically significant or correctable stenosis identified. 3. 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   MRA NECK  IMPRESSION:  1. Wide patency of the common and internal carotid arteries bilaterally.  2. Single focal moderate stenosis within the mid left V2 segment. Left vertebral arteries otherwise widely patent within the neck. Right vertebral artery dominant.    Transthoracic Echocardiogram   1. The left  ventricle has hyperdynamic systolic function, with an ejection fraction of >65%. The cavity size was normal. Left ventricular diastolic Doppler parameters are consistent with impaired relaxation.  2. The right ventricle has normal systolc function. The cavity was normal. There is no increase in right ventricular wall thickness.  3. The mitral valve is degenerative. Mild thickening of the mitral valve leaflet. There is moderate mitral annular calcification present. Mild mitral valve stenosis.  4. Mean transmitral gradient is 59mmHg.  5. Aortic valve regurgitation was not assessed by color flow Doppler.  6. Pulmonic valve regurgitation was not assessed by color flow Doppler.  7. No intracardiac thrombi or masses were  visualized.   EKG - SR rate 98 BPM. (See cardiology reading for complete details)   PHYSICAL EXAM  Temp:  [97.2 F (36.2 C)-98.4 F (36.9 C)] 98.4 F (36.9 C) (03/23 1250) Pulse Rate:  [78-86] 86 (03/23 1232) Resp:  [18-25] 20 (03/23 1232) BP: (94-153)/(53-80) 94/57 (03/23 1250) SpO2:  [96 %-100 %] 97 % (03/23 1232)  General - Well nourished, well developed, in no apparent distress.  Ophthalmologic - fundi not visualized due to noncooperation.  Cardiovascular - Regular rate and rhythm.  Mental Status -  Level of arousal and orientation to year, place, and person were intact, but stated this is "April" instead of March. Language including expression, naming, repetition, comprehension was assessed and found intact.  Cranial Nerves II - XII - II - Visual field intact OU. III, IV, VI - Extraocular movements intact. V - Facial sensation intact bilaterally. VII - Facial movement intact bilaterally. VIII - Hearing & vestibular intact bilaterally. X - Palate elevates symmetrically. XI - Chin turning & shoulder shrug intact bilaterally. XII - Tongue protrusion intact.  Motor Strength - The patient's strength was 4/5 bilateral upper extremities, 3/5 bilateral lower extremities proximal and distally and pronator drift was absent.  Bulk was normal and fasciculations were absent.   Motor Tone - Muscle tone was assessed at the neck and appendages and was normal.  Reflexes - The patient's reflexes were symmetrical in all extremities and he had no pathological reflexes.  Sensory - Light touch, temperature/pinprick were assessed and were symmetrical.    Coordination - The patient had normal movements in the hands with no ataxia or dysmetria.  Tremor was absent.  Gait and Station - deferred.    ASSESSMENT/PLAN Chad Fuller is a 85 y.o. male with history of diabetes, coronary artery disease, lung cancer, and PE presenting after a transient episode of left-sided weakness,  chronic left facial droop - possibly worse, recent fall and difficulty speaking.Marland Kitchen He did not receive IV t-PA due to late presentation.  Stroke: Bilateral patchy multifocal ischemic infarcts - embolic - source unknown.  Resultant generalized weakness  CT head - No CT evidence for acute intracranial abnormality.   MRI head - Patchy multifocal ischemic infarcts involving the bilateral cerebral hemispheres, most prominent of which measures 18 mm involving the right basal ganglia. Multiple remote infarcts.  MRA head and neck- focal moderate stenosis within the left M1 and mid left V2 segment  2D Echo EF > 65%  Loop recorder placed  LDL - 61  HgbA1c - 9.7  VTE prophylaxis - SCDs  aspirin 81 mg daily prior to admission, now on aspirin 325 mg daily  Patient will be counseled to be compliant with his antithrombotic medications  Ongoing aggressive stroke risk factor management  Therapy recommendations: SNF  Disposition:  Pending  Hx of stroke . 10/2014 admitted  for dysarthria and ataxia, MRI showed left paracentral pontine infarct secondary to small vessel disease.  MRA unremarkable.  Carotid Doppler negative.  TTE no source of emboli. LDL 70 and A1c 7.9.  Put on 325 aspirin on discharge.  Hypertension  Blood pressure somewhat low at times. . Permissive hypertension (OK if < 220/120) but gradually normalize in 5-7 days  Long-term BP goal normotensive   Hyperlipidemia  Lipid lowering medication PTA:  Zetia 10 mg daily  LDL 61, goal < 70  Current lipid lowering medication: Zetia 10 mg daily  Continue Zetia at discharge  Diabetes  HgbA1c 9.7, goal < 7.0  Uncontrolled  On Lantus  SSI  CBG monitoring  Close PCP follow-up and better DM control  Other Stroke Risk Factors  Advanced age  Former cigarette smoker - quit 44 years ago  Coronary artery disease  Other Active Problems  Elevated troponins - 0.08->0.10->0.15->0.13  Hypokalemia -  3.3->3.8  Hyponatremia Na - 132->133  Cancer Hx -stage Ia lung cancer non-small cell status post wadge resection of right upper lobe in [facility] day # 1  Neurology will sign off. Please call with questions. Pt will follow up with stroke clinic NP at [facility] in about 4 weeks. Thanks for the consult.  Rosalin Hawking, MD PhD Stroke Neurology 03/20/2019 1:12 PM   To contact Stroke Continuity provider, please refer to [email or web address] After hours, contact General Neurology

## 2019-03-20 NOTE — Progress Notes (Addendum)
Occupational Therapy Treatment Patient Details Name: Chad Fuller MRN: [id number] DOB: [birth year] Today's Date: 03/20/2019    History of present illness Chad Fuller is a 85 y.o. male with medical history significant of CVA, DM, COPD, GERD, CAD, stent placement, lung cancer in remission (partial lumpectomy), BPH, remote PE, dCHF, GI bleeding, HOH who presented to ER with left leg weakness, difficulty speaking, worsening left facial droop, fall, generalized weakness. History of CVA 10/2004. MRI revealed Patchy multifocal ischemic infarcts involving the bilateral cerebral hemispheres. s/p loop recorder placed 3/23.    OT comments  Pt presents supine in bed very pleasant and willing to participate in therapy session. Pt progressing towards OT goals; he continues to require two person assist for safe completion of functional transfers, though able to perform further distance mobility today with seated rest breaks provided PRN. Pt engaged in seated grooming ADL with setup/supervision assist. Noted pt with family preference for SNF rehab services vs CIR at time of discharge due to decreased availability of 24hr care after return home - have updated discharge recommendations to reflect. Will continue to follow acutely to progress pt towards established OT goals.   Follow Up Recommendations  SNF;Supervision/Assistance - 24 hour    Equipment Recommendations  Other (comment)(defer to next venue)          Precautions / Restrictions Precautions Precautions: Fall Precaution Comments: pt reports multiple falls Restrictions Weight Bearing Restrictions: No       Mobility Bed Mobility Overal bed mobility: Needs Assistance Bed Mobility: Supine to Sit     Supine to sit: Min guard;HOB elevated     General bed mobility comments: Increased time and effort and use of rail but no physical assist needed. + dizziness.   Transfers Overall transfer level: Needs assistance Equipment used: Rolling  walker (2 wheeled) Transfers: Sit to/from Stand Sit to Stand: Mod assist;+2 physical assistance;From elevated surface         General transfer comment: Assist of 2 to power to standing with use of momentum and cues for technique, posterior lean a few times; stood from EOB x1, from chair x2.     Balance Overall balance assessment: Needs assistance;History of Falls Sitting-balance support: Feet supported;No upper extremity supported Sitting balance-Leahy Scale: Fair Sitting balance - Comments: able to sit EOB unsupported    Standing balance support: Bilateral upper extremity supported;During functional activity Standing balance-Leahy Scale: Poor Standing balance comment: Requires external support in standing. Posterior lean. Marching in place with Min A.                            ADL either performed or assessed with clinical judgement   ADL Overall ADL's : Needs assistance/impaired     Grooming: Set up;Sitting;Wash/dry face;Brushing hair                               Functional mobility during ADLs: Moderate assistance;+2 for physical assistance;+2 for safety/equipment;Rolling walker General ADL Comments: pt with improvements in mobility and activity tolerance today, continue to require +2 assist due to imbalances with mobility     Vision       Perception     Praxis      Cognition Arousal/Alertness: Awake/alert Behavior During Therapy: WFL for tasks assessed/performed Overall Cognitive Status: No family/caregiver present to determine baseline cognitive functioning Area of Impairment: Orientation  Orientation Level: Disoriented to;Time("1971")     Following Commands: Follows one step commands with increased time;Follows multi-step commands inconsistently(and repetition)     Problem Solving: Slow processing;Requires verbal cues;Requires tactile cues          Exercises     Shoulder Instructions       General  Comments pt with loop recorder placement this AM, dressing intact at start and end of session, RN checking start of session and okay to work with therapy    Pertinent Vitals/ Pain       Pain Assessment: No/denies pain  Home Living                                          Prior Functioning/Environment              Frequency  Min 3X/week        Progress Toward Goals  OT Goals(current goals can now be found in the care plan section)  Progress towards OT goals: Progressing toward goals  Acute Rehab OT Goals Patient Stated Goal: to get stronger OT Goal Formulation: With patient Time For Goal Achievement: 04/01/19 Potential to Achieve Goals: Good ADL Goals Pt Will Perform Grooming: standing;with min assist Pt Will Perform Upper Body Dressing: with set-up;sitting Pt Will Perform Lower Body Dressing: with min assist;with adaptive equipment;sit to/from stand Pt Will Transfer to Toilet: with min assist;ambulating;bedside commode Pt Will Perform Toileting - Clothing Manipulation and hygiene: with min assist;sit to/from stand Additional ADL Goal #1: Pt will perform bed mobility with supervision in preparation for ADL.  Plan Discharge plan needs to be updated    Co-evaluation    PT/OT/SLP Co-Evaluation/Treatment: Yes Reason for Co-Treatment: Complexity of the patient's impairments (multi-system involvement);To address functional/ADL transfers PT goals addressed during session: Mobility/safety with mobility;Balance;Proper use of DME;Strengthening/ROM OT goals addressed during session: ADL's and self-care;Proper use of Adaptive equipment and DME      AM-PAC OT "6 Clicks" Daily Activity     Outcome Measure   Help from another person eating meals?: A Little Help from another person taking care of personal grooming?: A Little Help from another person toileting, which includes using toliet, bedpan, or urinal?: A Lot Help from another person bathing (including  washing, rinsing, drying)?: A Lot Help from another person to put on and taking off regular upper body clothing?: A Little Help from another person to put on and taking off regular lower body clothing?: A Lot 6 Click Score: 15    End of Session Equipment Utilized During Treatment: Gait belt;Rolling walker  OT Visit Diagnosis: Unsteadiness on feet (R26.81);Other abnormalities of gait and mobility (R26.89);Other symptoms and signs involving cognitive function;Pain;Muscle weakness (generalized) (M62.81);History of falling (Z91.81)   Activity Tolerance Patient tolerated treatment well   Patient Left in chair;with call bell/phone within reach;with chair alarm set   Nurse Communication Mobility status        Time: 6160-7371 OT Time Calculation (min): 27 min  Charges: OT General Charges $OT Visit: 1 Visit OT Treatments $Self Care/Home Management : 8-22 mins  Lou Cal, Trainer Pager [phone] Office [place] 03/20/2019, 1:37 PM

## 2019-03-20 NOTE — Progress Notes (Signed)
[facility] PROGRESS NOTE  TAMARICK KOVALCIK FHL:[id number] DOB: 06/27/1926 DOA: 03/17/2019  PCP: Lajean Manes, MD  Brief History/Interval Summary: 85 y.o. male with medical history significant of stroke, diabetes mellitus, COPD, GERD, CAD, stent placement, lung cancer in remission (partial lumpectomy), BPH, remote PE, dCHF, GI bleeding, hard of hearing, who presented with left leg weakness, difficulty speaking, worsening left facial droop, fall, generalized weakness.  MRI raise concern for stroke.  Patient was hospitalized for further management.  Reason for Visit: Acute stroke  Consultants: Neurology  Procedures:   Transthoracic echocardiogram  1. The left ventricle has hyperdynamic systolic function, with an ejection fraction of >65%. The cavity size was normal. Left ventricular diastolic Doppler parameters are consistent with impaired relaxation.  2. The right ventricle has normal systolc function. The cavity was normal. There is no increase in right ventricular wall thickness.  3. The mitral valve is degenerative. Mild thickening of the mitral valve leaflet. There is moderate mitral annular calcification present. Mild mitral valve stenosis.  4. Mean transmitral gradient is 36mmHg.  5. Aortic valve regurgitation was not assessed by color flow Doppler.  6. Pulmonic valve regurgitation was not assessed by color flow Doppler.  7. No intracardiac thrombi or masses were visualized.  Antibiotics: None  Subjective/Interval History: Patient denies any new complaints.  Continues to have some pain in his left foot area.  However he has been able to move his lower extremities better than before.    ROS: Denies any nausea vomiting.  No shortness of breath or chest pains.  Objective:  Vital Signs  Vitals:   03/19/19 2005 03/20/19 0011 03/20/19 0403 03/20/19 0805  BP:  (!) 153/80 128/79 133/79  Pulse: 85 83 81   Resp:   18 19  Temp: 97.9 F (36.6 C) 97.6 F (36.4 C) 97.8 F  (36.6 C) 97.6 F (36.4 C)  TempSrc: Oral Oral Oral Oral  SpO2: 100% 99% 99% 98%  Weight:      Height:        Intake/Output Summary (Last 24 hours) at 03/20/2019 0832 Last data filed at 03/20/2019 0404 Gross per 24 hour  Intake --  Output 1850 ml  Net -1850 ml   Filed Weights   03/18/19 1154  Weight: 89.6 kg   General appearance: Awake alert.  In no distress Resp: Clear to auscultation bilaterally.  Normal effort Cardio: S1-S2 is normal regular.  No S3-S4.  No rubs murmurs or bruit GI: Abdomen is soft.  Nontender nondistended.  Bowel sounds are present normal.  No masses organomegaly Extremities: Stable appearing left foot including the blackish discoloration on the tip of the second toe.  No obvious abnormality noted in the heel area.  Good range of motion of the ankle. Neurologic: Oriented to place person.  No facial asymmetry.  Able to lift his legs more than he was on admission.  Strength appears to be 4-5 out of 5 both lower extremities.    Lab Results:  Data Reviewed: I have personally reviewed following labs and imaging studies  CBC: Recent Labs  Lab 03/17/19 2025 03/19/19 0526 03/20/19 0557  WBC 6.0 5.4 5.4  NEUTROABS 4.2  --   --   HGB 12.7* 10.9* 11.5*  HCT 36.7* 30.7* 33.9*  MCV 92.7 91.1 90.9  PLT PLATELET CLUMPS NOTED ON SMEAR, UNABLE TO ESTIMATE 100* 124*    Basic Metabolic Panel: Recent Labs  Lab 03/17/19 2025 03/18/19 0709 03/19/19 0526 03/20/19 0557  NA 132*  --  134*  133*  K 3.3*  --  3.4* 3.8  CL 97*  --  100 102  CO2 26  --  26 26  GLUCOSE 249*  --  181* 126*  BUN 28*  --  16 13  CREATININE 1.25*  --  0.87 0.85  CALCIUM 8.8*  --  8.5* 8.3*  MG  --  1.8  --   --     GFR: Estimated Creatinine Clearance: 69 mL/min (by C-G formula based on SCr of 0.85 mg/dL).  Liver Function Tests: Recent Labs  Lab 03/17/19 2025 03/19/19 0526  AST 27 20  ALT 14 11  ALKPHOS 93 84  BILITOT 1.3* 1.0  PROT 6.2* 5.6*  ALBUMIN 3.3* 2.8*      Cardiac Enzymes: Recent Labs  Lab 03/17/19 2042 03/17/19 2316 03/18/19 0041 03/18/19 0709 03/18/19 1142  CKTOTAL  --  150  --   --   --   TROPONINI 0.08*  --  0.10* 0.15* 0.13*     HbA1C: Recent Labs    03/18/19 0307  HGBA1C 9.7*    CBG: Recent Labs  Lab 03/18/19 2117 03/19/19 0736 03/19/19 1159 03/19/19 1607 03/19/19 2104  GLUCAP 141* 220* 190* 194* 187*    Lipid Profile: Recent Labs    03/18/19 0307  CHOL 107  HDL 33*  LDLCALC 61  TRIG 67  CHOLHDL 3.2     Radiology Studies: Dg Swallowing Func-speech Pathology  Result Date: 03/18/2019 Objective Swallowing Evaluation: Type of Study: MBS-Modified Barium Swallow Study  Patient Details Name: EZRA DENNE MRN: [id number] Date of Birth: 1926/09/30 Today's Date: 03/18/2019 Time: SLP Start Time (ACUTE ONLY): 71 -SLP Stop Time (ACUTE ONLY): 1055 SLP Time Calculation (min) (ACUTE ONLY): 25 min Past Medical History: Past Medical History: Diagnosis Date  CAD (coronary artery disease)   Cancer ([place])   lung  Diabetes mellitus   History of blood clots   Prostate enlargement   Pulmonary embolus ([place]) july 2005 Past Surgical History: Past Surgical History: Procedure Laterality Date  COLONOSCOPY N/A 07/24/2015  Procedure: COLONOSCOPY;  Surgeon: Laurence Spates, MD;  Location[facility] ENDOSCOPY;  Service: Endoscopy;  Laterality: N/A;  CORONARY STENT PLACEMENT    ESOPHAGOGASTRODUODENOSCOPY N/A 07/24/2015  Procedure: ESOPHAGOGASTRODUODENOSCOPY (EGD);  Surgeon: Laurence Spates, MD;  Location: [facility] ENDOSCOPY;  Service: Endoscopy;  Laterality: N/A;  bleeding source not in colon; EGD performed to locate source  FILTERING PROCEDURE    reports filter placed after knee surgery for blood clots  I&D EXTREMITY  08/16/2012  Procedure: MINOR IRRIGATION AND DEBRIDEMENT EXTREMITY;  Surgeon: Tennis Must, MD;  Location: [place];  Service: Orthopedics;  Laterality: Right;  KNEE SURGERY    LUNG REMOVAL, PARTIAL   HPI:  GENARO BEKKER is a 85 y.o. male with medical history significant of stroke, diabetes mellitus, COPD, GERD, CAD, stent placement, lung cancer in remission (partial lumpectomy), BPH, remote PE, dCHF, GI bleeding, hard of hearing, who presented to ER with left leg weakness, difficulty speaking, worsening left facial droop, fall, generalized weakness. 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 Discharged from [facility] with mechanical soft diet and nectar thickened liquids. MRI shows patchy multifocal ischemic infarcts involving the bilateral cerebral hemispheres.  Subjective: alert, cooperative Assessment / Plan / Recommendation CHL IP CLINICAL IMPRESSIONS 03/18/2019 Clinical Impression Patient hospitalized with CVA and Dysphagia from previous CVA in 2015. Patient presents with mild to moderate oropharyngeal dsyphagia. Oral phase characterized by poor bolus formation with all liquids, poor tongue elevation and propulsion of  bolus, spillage to pyriform sinuses on both thin liquids and nectar thickened liquids (delayed swallow). Penetration and aspiration on larger thin liquid cup sips during swallow with no sensation. Inability to clear with a cued cough. Mild pharyngeal coating with liquids. Swallow triggered with solids as bolus passes tip of epiglottis. Complete epiglottis inversion and laryngeal closure. Adequate UES opening with no pyriform residue. Esophageal sweep showed good clearance. Recommend Regular diet with nectar thickened liquds, medications to be given in whole in puree. Patient to sit at 90 degrees for all meals. Speech therapy to follow up dysphagia therapy, diet tolerance and upgrades. Patient seemingly understood diet recommendation and aspiration precautions, despite his age and recent CVA. Nurse notified of diet and aspiration precautions.  SLP Visit Diagnosis Dysphagia, oropharyngeal phase (R13.12) Attention and concentration deficit following -- Frontal lobe and executive function deficit  following -- Impact on safety and function Moderate aspiration risk   CHL IP TREATMENT RECOMMENDATION 03/18/2019 Treatment Recommendations F/U MBS in --- days (Comment);Defer treatment plan to f/u with SLP   Prognosis 03/18/2019 Prognosis for Safe Diet Advancement Good Barriers to Reach Goals -- Barriers/Prognosis Comment -- CHL IP DIET RECOMMENDATION 03/18/2019 SLP Diet Recommendations Regular solids;Nectar thick liquid Liquid Administration via Cup;No straw Medication Administration Whole meds with puree Compensations Slow rate;Small sips/bites Postural Changes Seated upright at 90 degrees   CHL IP OTHER RECOMMENDATIONS 03/18/2019 Recommended Consults -- Oral Care Recommendations Oral care BID Other Recommendations Order thickener from pharmacy   CHL IP FOLLOW UP RECOMMENDATIONS 11/20/2014 Follow up Recommendations Inpatient Rehab   CHL IP FREQUENCY AND DURATION 03/18/2019 Speech Therapy Frequency (ACUTE ONLY) min 2x/week Treatment Duration 2 weeks      CHL IP ORAL PHASE 03/18/2019 Oral Phase Impaired Oral - Pudding Teaspoon -- Oral - Pudding Cup -- Oral - Honey Teaspoon -- Oral - Honey Cup -- Oral - Nectar Teaspoon -- Oral - Nectar Cup Weak lingual manipulation;Incomplete tongue to palate contact;Reduced posterior propulsion Oral - Nectar Straw -- Oral - Thin Teaspoon -- Oral - Thin Cup Weak lingual manipulation;Incomplete tongue to palate contact;Reduced posterior propulsion Oral - Thin Straw -- Oral - Puree Incomplete tongue to palate contact;Reduced posterior propulsion Oral - Mech Soft Weak lingual manipulation;Incomplete tongue to palate contact;Reduced posterior propulsion Oral - Regular -- Oral - Multi-Consistency -- Oral - Pill -- Oral Phase - Comment --  CHL IP PHARYNGEAL PHASE 03/18/2019 Pharyngeal Phase Impaired Pharyngeal- Pudding Teaspoon Delayed swallow initiation-vallecula Pharyngeal -- Pharyngeal- Pudding Cup -- Pharyngeal -- Pharyngeal- Honey Teaspoon -- Pharyngeal -- Pharyngeal- Honey Cup -- Pharyngeal  -- Pharyngeal- Nectar Teaspoon -- Pharyngeal -- Pharyngeal- Nectar Cup Delayed swallow initiation-pyriform sinuses Pharyngeal -- Pharyngeal- Nectar Straw -- Pharyngeal -- Pharyngeal- Thin Teaspoon -- Pharyngeal -- Pharyngeal- Thin Cup Delayed swallow initiation-pyriform sinuses;Penetration/Aspiration during swallow Pharyngeal -- Pharyngeal- Thin Straw -- Pharyngeal -- Pharyngeal- Puree -- Pharyngeal -- Pharyngeal- Mechanical Soft Delayed swallow initiation-vallecula Pharyngeal -- Pharyngeal- Regular -- Pharyngeal -- Pharyngeal- Multi-consistency -- Pharyngeal -- Pharyngeal- Pill -- Pharyngeal -- Pharyngeal Comment --  CHL IP CERVICAL ESOPHAGEAL PHASE 03/18/2019 Cervical Esophageal Phase WFL Pudding Teaspoon -- Pudding Cup -- Honey Teaspoon -- Honey Cup -- Nectar Teaspoon -- Nectar Cup -- Nectar Straw -- Thin Teaspoon -- Thin Cup -- Thin Straw -- Puree -- Mechanical Soft -- Regular -- Multi-consistency -- Pill -- Cervical Esophageal Comment -- Charlynne Cousins Ward 03/18/2019, 11:17 AM                Medications:  Scheduled:  alfuzosin  10 mg Oral Q  breakfast   aspirin  325 mg Oral Daily   ezetimibe  10 mg Oral Daily   finasteride  5 mg Oral Daily   insulin aspart  0-9 Units Subcutaneous TID WC   insulin glargine  30 Units Subcutaneous Daily   multivitamin with minerals  1 tablet Oral Daily   Continuous:  sodium chloride 50 mL/hr at 03/20/19 0213   WYO:VZCHYIFOYDXAJ **OR** acetaminophen (TYLENOL) oral liquid 160 mg/5 mL **OR** acetaminophen, hydrALAZINE, nitroGLYCERIN, ondansetron (ZOFRAN) IV, polyethylene glycol, senna-docusate    Assessment/Plan:  Acute stroke/left leg weakness MRI brain showed patchy multifocal ischemic infarcts involving the bilateral cerebral hemispheres.  MRA neck did not show any significant stenosis.  Echocardiogram is as above.  Patient seen by neurology.  Recommendation is for heart monitor.  Seen by electrophysiology and plan is to do loop recorder.  Echocardiogram is  pending.  LDL is 61.  Continue Zetia.  HbA1c 9.7.  PT and OT is recommended inpatient rehab consultation.  This has been ordered.  History of coronary artery disease with mildly elevated troponin EKG did not show any ischemic changes. Mild leak of troponin likely due to other acute illness and possible demand ischemia.  Echocardiogram shows normal systolic function.  No comment on wall motion.  However patient denies any chest pain.  We will continue medical management for now.  Continue aspirin and statin.  Not on beta-blocker.  No clear indication to initiate one either.  Patient with history of CAD although it is unknown as to the type of coronary artery disease and what interventions have been performed previously.  No cardiology visits noted in the EMR.   Diabetes mellitus type 2 without any complications OIN8M 9.7.  Monitor CBGs.  SSI.  Continue lower dose of Lantus.  BG's are reasonably well controlled.  Normocytic anemia Mild drop in hemoglobin is likely dilutional.  No evidence of overt bleeding.  Anemia panel shows a ferritin of 139, iron of 98, percent saturation 56, TIBC 176.  B12 411.  Folate 6.1.  History of GERD PPI  History of BPH Continue home medications.  Chronic diastolic CHF Stable.  Appears to be euvolemic.  Lasix is on hold.  Echocardiogram report as above.  History of fall PT and OT evaluation.  No obvious injuries identified.  Inpatient rehabilitation recommended.  Acute kidney injury with hypokalemia BUN and creatinine noted to be higher than his baseline.  Baseline creatinine 0.6 however this was from 2016.  His diuretics were held.  Renal function appears to be back to baseline now.  Potassium is 3.8 today.    Essential hypertension Hyzaar and furosemide are on hold.  Pressure is reasonably well controlled.  Some degree of permissive hypertension allowed.  Hyperlipidemia LDL at goal at 61.  Patient is on Zetia which will be continued.  Dark discoloration of  the tip of the second toe on the left Could have peripheral artery disease.  Based on arterial Dopplers done in 2017 his he had near normal ABIs.  He has very poor pulsations however no evidence for acute ischemia in the lower extremities.  This can be pursued in the outpatient setting.  DVT Prophylaxis: SCDs    Code Status: DNR Family Communication: Discussed with the patient.  Discussed with his daughter yesterday. Disposition Plan: Management as outlined above.  Loop recorder to be placed.  Await rehab evaluation.    LOS: 1 day   Etienne Millward [employer] on [email or web address]  03/20/2019, 8:32 AM

## 2019-03-20 NOTE — Progress Notes (Signed)
  Inpatient Rehabilitation Admissions Coordinator  Inpatient Rehab Consult received. I met with patient at the bedside for rehabilitation assessment and spoke with his daughter, Aquilla Solian, by phone per patient request. We discussed goals and expectations of an inpatient rehab admission.  Patient lives at home and with multiple falls. Children unable to provide 24/7 projected needed assistance at home and are requesting SNF at [place]. I will contact Kathlee Nations, SW, to make her aware of family request. We will sign off at this time.  Danne Baxter, RN, MSN Rehab Admissions Coordinator [phone] 03/20/2019 10:29 AM

## 2019-03-21 ENCOUNTER — Encounter (HOSPITAL_COMMUNITY): Payer: Self-pay | Admitting: Internal Medicine

## 2019-03-21 DIAGNOSIS — Z85118 Personal history of other malignant neoplasm of bronchus and lung: Secondary | ICD-10-CM | POA: Diagnosis not present

## 2019-03-21 DIAGNOSIS — R0989 Other specified symptoms and signs involving the circulatory and respiratory systems: Secondary | ICD-10-CM | POA: Diagnosis not present

## 2019-03-21 DIAGNOSIS — R41841 Cognitive communication deficit: Secondary | ICD-10-CM | POA: Diagnosis not present

## 2019-03-21 DIAGNOSIS — E119 Type 2 diabetes mellitus without complications: Secondary | ICD-10-CM | POA: Diagnosis not present

## 2019-03-21 DIAGNOSIS — K219 Gastro-esophageal reflux disease without esophagitis: Secondary | ICD-10-CM | POA: Diagnosis not present

## 2019-03-21 DIAGNOSIS — R5382 Chronic fatigue, unspecified: Secondary | ICD-10-CM | POA: Diagnosis not present

## 2019-03-21 DIAGNOSIS — R05 Cough: Secondary | ICD-10-CM | POA: Diagnosis not present

## 2019-03-21 DIAGNOSIS — Z743 Need for continuous supervision: Secondary | ICD-10-CM | POA: Diagnosis not present

## 2019-03-21 DIAGNOSIS — I69959 Hemiplegia and hemiparesis following unspecified cerebrovascular disease affecting unspecified side: Secondary | ICD-10-CM | POA: Diagnosis not present

## 2019-03-21 DIAGNOSIS — N179 Acute kidney failure, unspecified: Secondary | ICD-10-CM | POA: Diagnosis not present

## 2019-03-21 DIAGNOSIS — I639 Cerebral infarction, unspecified: Secondary | ICD-10-CM | POA: Diagnosis not present

## 2019-03-21 DIAGNOSIS — R1312 Dysphagia, oropharyngeal phase: Secondary | ICD-10-CM | POA: Diagnosis not present

## 2019-03-21 DIAGNOSIS — R2681 Unsteadiness on feet: Secondary | ICD-10-CM | POA: Diagnosis not present

## 2019-03-21 DIAGNOSIS — R7989 Other specified abnormal findings of blood chemistry: Secondary | ICD-10-CM | POA: Diagnosis not present

## 2019-03-21 DIAGNOSIS — I10 Essential (primary) hypertension: Secondary | ICD-10-CM | POA: Diagnosis not present

## 2019-03-21 DIAGNOSIS — J189 Pneumonia, unspecified organism: Secondary | ICD-10-CM | POA: Diagnosis not present

## 2019-03-21 DIAGNOSIS — D649 Anemia, unspecified: Secondary | ICD-10-CM | POA: Diagnosis not present

## 2019-03-21 DIAGNOSIS — I5032 Chronic diastolic (congestive) heart failure: Secondary | ICD-10-CM | POA: Diagnosis not present

## 2019-03-21 DIAGNOSIS — E785 Hyperlipidemia, unspecified: Secondary | ICD-10-CM | POA: Diagnosis not present

## 2019-03-21 DIAGNOSIS — E46 Unspecified protein-calorie malnutrition: Secondary | ICD-10-CM | POA: Diagnosis not present

## 2019-03-21 DIAGNOSIS — J449 Chronic obstructive pulmonary disease, unspecified: Secondary | ICD-10-CM | POA: Diagnosis not present

## 2019-03-21 DIAGNOSIS — I2510 Atherosclerotic heart disease of native coronary artery without angina pectoris: Secondary | ICD-10-CM | POA: Diagnosis not present

## 2019-03-21 DIAGNOSIS — R279 Unspecified lack of coordination: Secondary | ICD-10-CM | POA: Diagnosis not present

## 2019-03-21 DIAGNOSIS — G459 Transient cerebral ischemic attack, unspecified: Secondary | ICD-10-CM | POA: Diagnosis not present

## 2019-03-21 DIAGNOSIS — R278 Other lack of coordination: Secondary | ICD-10-CM | POA: Diagnosis not present

## 2019-03-21 DIAGNOSIS — R489 Unspecified symbolic dysfunctions: Secondary | ICD-10-CM | POA: Diagnosis not present

## 2019-03-21 DIAGNOSIS — L89620 Pressure ulcer of left heel, unstageable: Secondary | ICD-10-CM | POA: Diagnosis not present

## 2019-03-21 DIAGNOSIS — M6281 Muscle weakness (generalized): Secondary | ICD-10-CM | POA: Diagnosis not present

## 2019-03-21 DIAGNOSIS — N400 Enlarged prostate without lower urinary tract symptoms: Secondary | ICD-10-CM | POA: Diagnosis not present

## 2019-03-21 DIAGNOSIS — I69354 Hemiplegia and hemiparesis following cerebral infarction affecting left non-dominant side: Secondary | ICD-10-CM | POA: Diagnosis not present

## 2019-03-21 DIAGNOSIS — E1151 Type 2 diabetes mellitus with diabetic peripheral angiopathy without gangrene: Secondary | ICD-10-CM | POA: Diagnosis not present

## 2019-03-21 DIAGNOSIS — R1319 Other dysphagia: Secondary | ICD-10-CM | POA: Diagnosis not present

## 2019-03-21 LAB — GLUCOSE, CAPILLARY: Glucose-Capillary: 82 mg/dL (ref 70–99)

## 2019-03-21 MED ORDER — POLYETHYLENE GLYCOL 3350 17 G PO PACK
17.0000 g | PACK | Freq: Every day | ORAL | Status: DC
  Filled 2019-03-21: qty 1

## 2019-03-21 MED ORDER — INSULIN GLARGINE 100 UNIT/ML ~~LOC~~ SOLN: 30.0000 [IU] | Freq: Every day | SUBCUTANEOUS | 11 refills | Status: DC

## 2019-03-21 MED ORDER — SENNOSIDES-DOCUSATE SODIUM 8.6-50 MG PO TABS: 1.0000 | ORAL_TABLET | Freq: Every day | ORAL | Status: DC

## 2019-03-21 MED ORDER — FUROSEMIDE 40 MG PO TABS: 20.0000 mg | ORAL_TABLET | Freq: Every day | ORAL | 6 refills | Status: DC

## 2019-03-21 NOTE — TOC Transition Note (Addendum)
Transition of Care [facility]) - CM/SW Discharge Note   Patient Details  Name: Chad Fuller MRN: [id number] Date of Birth: 1926-06-11  Transition of Care [facility]) CM/SW Contact:  Geralynn Ochs, LCSW Phone Number: 03/21/2019, 10:38 AM   Clinical Narrative:  Nurse to call report to [phone], Room 205    Final next level of care: Skilled Nursing Facility Barriers to Discharge: No Barriers Identified   Patient Goals and CMS Choice Patient states their goals for this hospitalization and ongoing recovery are:: Pt daughter wants her father to get better CMS Medicare.gov Compare Post Acute Care list provided to:: Patient Represenative (must comment)(Pt daughter [name]) Choice offered to / list presented to : Adult Children  Discharge Placement              Patient chooses bed at: [place], [place] Patient to be transferred to facility by: [place] Name of family member notified: [name] Patient and family notified of of transfer: 03/21/19  Discharge Plan and Services In-house Referral: NA Discharge Planning Services: NA Post Acute Care Choice: [place]          DME Arranged: NIV DME Agency: NA HH Arranged: NA HH Agency: NA   Social Determinants of Health (SDOH) Interventions     Readmission Risk Interventions Readmission Risk Prevention Plan 03/20/2019  Transportation Screening Complete  PCP or Specialist Appt within 5-7 Days Complete  Home Care Screening Complete  Medication Review (RN CM) Complete  Some recent data might be hidden

## 2019-03-21 NOTE — Discharge Summary (Signed)
[facility]  Physician Discharge Summary   Patient ID: LEVIE WAGES MRN: [id number] DOB/AGE: 02-12-1926 85 y.o.  Admit date: 03/17/2019 Discharge date: 03/21/2019  PCP: Lajean Manes, MD  DISCHARGE DIAGNOSES:  Acute stroke, nonhemorrhagic History of coronary artery disease Mild elevation in troponin Diabetes mellitus type 2 without complications Normocytic anemia Chronic diastolic CHF Acute kidney injury with hypokalemia, resolved Essential hypertension Hyperlipidemia  RECOMMENDATIONS FOR OUTPATIENT FOLLOW UP: 1. Patient to go to skilled nursing facility for rehab 2. Referral sent to neurology for follow-up 3. Please consider referral to vascular surgery on a nonurgent basis for possible peripheral artery disease 4. CBC and basic metabolic panel in a few days.    Home Health:NA  Equipment/Devices:NA   CODE STATUS: DNR  DISCHARGE CONDITION: fair  Diet recommendation: Heart healthy diet with nectar thick liquids  INITIAL HISTORY: 85 y.o.malewith medical history significant ofstroke, diabetes mellitus, COPD, GERD, CAD, stent placement, lung cancer in remission (partial lumpectomy),BPH, remote PE,dCHF, GI bleeding, hard of hearing, who presented withleft leg weakness, difficulty speaking, worsening left facial droop, fall, generalized weakness.  MRI raise concern for stroke.  Patient was hospitalized for further management.  Consultants: Neurology  Procedures:   Transthoracic echocardiogram 1. The left ventricle has hyperdynamic systolic function, with an ejection fraction of >65%. The cavity size was normal. Left ventricular diastolic Doppler parameters are consistent with impaired relaxation. 2. The right ventricle has normal systolc function. The cavity was normal. There is no increase in right ventricular wall thickness. 3. The mitral valve is degenerative. Mild thickening of the mitral valve leaflet. There is moderate mitral annular  calcification present. Mild mitral valve stenosis. 4. Mean transmitral gradient is 34mmHg. 5. Aortic valve regurgitation was not assessed by color flow Doppler. 6. Pulmonic valve regurgitation was not assessed by color flow Doppler. 7. No intracardiac thrombi or masses were visualized.   Loop recorder placement on 3/23   HOSPITAL COURSE:   Acute stroke/left leg weakness MRI brain showed patchy multifocal ischemic infarcts involving the bilateral cerebral hemispheres.  MRA neck did not show any significant stenosis.  Echocardiogram is as above.  Patient seen by neurology.  Aspirin dose changed from 81 to 325 mg daily.  They also recommended heart monitor.  Loop recorder was placed by electrophysiology. LDL is 61.  Continue Zetia.  HbA1c 9.7.    Seen by PT and OT who recommended inpatient rehab.  Patient screened by inpatient rehab and not thought to be a good candidate for the same.  They recommended skilled nursing facility rehab.  History of coronary artery disease with mildly elevated troponin EKG did not show any ischemic changes. Mild leak of troponin likely due to other acute illness and possible demand ischemia.  Echocardiogram shows normal systolic function.  No comment on wall motion.  However patient denies any chest pain.  We will continue medical management for now.  Continue aspirin and statin.  Not on beta-blocker.  No clear indication to initiate one either.  Patient with history of CAD although it is unknown as to the type of coronary artery disease and what interventions have been performed previously.  No cardiology visits noted in the EMR.  Referral to cardiology can be considered once the patient has returned back to his baseline.  Diabetes mellitus type 2 without any complications VOZ3G 9.7.    CBGs are reasonably well controlled.  Patient is on Lantus at a lower dose which can be continued.  Normocytic anemia Mild drop in hemoglobin is likely dilutional.  No evidence  of overt bleeding.  Anemia panel shows a ferritin of 139, iron of 98, percent saturation 56, TIBC 176.  B12 411.  Folate 6.1.  History of GERD PPI  History of BPH Continue home medications.  Chronic diastolic CHF Stable.  Stable.  Patient's Lasix was on hold due to elevated creatinine.  This is improved.  Lasix can be resumed at a lower dose.  Monitor volume status closely.  History of fall He will need rehab.  Acute kidney injury with hypokalemia BUN and creatinine noted to be higher than his baseline.  Baseline creatinine 0.6 however this was from 2016.  His diuretics were held.  Renal function appears to be back to baseline now.      Essential hypertension Permissive hypertension was allowed.  Okay to resume blood pressure medications.  Monitor renal function closely.  Hyperlipidemia LDL at goal at 61.  Patient is on Zetia which will be continued.  Dark discoloration of the tip of the second toe on the left Could have peripheral artery disease.  Based on arterial Dopplers done in 2017 his he had near normal ABIs.  He has very poor pulsations however no evidence for acute ischemia in the lower extremities.  This can be pursued in the outpatient setting.  Patient complains of constipation this morning.  He will be given laxatives and stool softeners.  Overall stable.  Okay for discharge to skilled nursing facility today.     PERTINENT LABS:  The results of significant diagnostics from this hospitalization (including imaging, microbiology, ancillary and laboratory) are listed below for reference.    Microbiology: Recent Results (from the past 240 hour(s))  Urine culture     Status: None   Collection Time: 03/17/19  8:32 PM  Result Value Ref Range Status   Specimen Description URINE, RANDOM  Final   Special Requests NONE  Final   Culture   Final    NO GROWTH Performed at [facility], [address] [place], [place] [address]    Report Status 03/19/2019  FINAL  Final     Labs: Basic Metabolic Panel: Recent Labs  Lab 03/17/19 2025 03/18/19 0709 03/19/19 0526 03/20/19 0557  NA 132*  --  134* 133*  K 3.3*  --  3.4* 3.8  CL 97*  --  100 102  CO2 26  --  26 26  GLUCOSE 249*  --  181* 126*  BUN 28*  --  16 13  CREATININE 1.25*  --  0.87 0.85  CALCIUM 8.8*  --  8.5* 8.3*  MG  --  1.8  --   --    Liver Function Tests: Recent Labs  Lab 03/17/19 2025 03/19/19 0526  AST 27 20  ALT 14 11  ALKPHOS 93 84  BILITOT 1.3* 1.0  PROT 6.2* 5.6*  ALBUMIN 3.3* 2.8*   CBC: Recent Labs  Lab 03/17/19 2025 03/19/19 0526 03/20/19 0557  WBC 6.0 5.4 5.4  NEUTROABS 4.2  --   --   HGB 12.7* 10.9* 11.5*  HCT 36.7* 30.7* 33.9*  MCV 92.7 91.1 90.9  PLT PLATELET CLUMPS NOTED ON SMEAR, UNABLE TO ESTIMATE 100* 124*   Cardiac Enzymes: Recent Labs  Lab 03/17/19 2042 03/17/19 2316 03/18/19 0041 03/18/19 0709 03/18/19 1142  CKTOTAL  --  150  --   --   --   TROPONINI 0.08*  --  0.10* 0.15* 0.13*   BNP: BNP (last 3 results) Recent Labs    03/18/19 0307  BNP 46.8     CBG: Recent Labs  Lab 03/20/19 0901 03/20/19 1136 03/20/19 1704 03/20/19 2059 03/21/19 0837  GLUCAP 134* 179* 207* 183* 82     IMAGING STUDIES Dg Chest 2 View  Result Date: 03/17/2019 CLINICAL DATA:  Weakness EXAM: CHEST - 2 VIEW COMPARISON:  02/03/2018, 11/18/2014 FINDINGS: Ankylosis of the spine. No pleural effusion. Stable nodule in the right mid to upper lung. No acute consolidation. Stable cardiomediastinal silhouette with aortic atherosclerosis. No pneumothorax. IMPRESSION: No active cardiopulmonary disease. Electronically Signed   By: Donavan Foil M.D.   On: 03/17/2019 22:46   Dg Pelvis 1-2 Views  Result Date: 03/17/2019 CLINICAL DATA:  Weakness, fall EXAM: PELVIS - 1-2 VIEW COMPARISON:  None. FINDINGS: SI joints are non widened. Pubic symphysis and rami are intact. No fracture or malalignment. IMPRESSION: No acute osseous abnormality. Electronically  Signed   By: Donavan Foil M.D.   On: 03/17/2019 22:47   Ct Head Wo Contrast  Result Date: 03/17/2019 CLINICAL DATA:  Head trauma EXAM: CT HEAD WITHOUT CONTRAST TECHNIQUE: Contiguous axial images were obtained from the base of the skull through the vertex without intravenous contrast. COMPARISON:  MRI 11/19/2014, CT brain 11/18/2014 FINDINGS: Brain: No acute territorial infarction, hemorrhage or intracranial mass. Marked atrophy. Small vessel ischemic changes of the white matter. Chronic lacunar infarct within the right basal ganglia. Prominent ventricles, felt secondary to atrophy Vascular: No hyperdense vessels.  Carotid vascular calcification Skull: Normal. Negative for fracture or focal lesion. Sinuses/Orbits: No acute finding. Other: None IMPRESSION: 1. No CT evidence for acute intracranial abnormality. 2. Atrophy and mild small vessel ischemic changes of the white matter Electronically Signed   By: Donavan Foil M.D.   On: 03/17/2019 22:45   Mr Virgel Paling WG Contrast  Result Date: 03/18/2019 CLINICAL DATA:  Initial evaluation for left leg weakness, difficulty speaking, worsening left facial droop, weakness. EXAM: MRI HEAD WITHOUT CONTRAST MRA HEAD WITHOUT CONTRAST MRA NECK WITHOUT AND WITH CONTRAST TECHNIQUE: Multiplanar, multiecho pulse sequences of the brain and surrounding structures were obtained without intravenous contrast. Angiographic images of the Circle of Willis were obtained using MRA technique without intravenous contrast. Angiographic images of the neck were obtained using MRA technique without and with intravenous contrast. Carotid stenosis measurements (when applicable) are obtained utilizing NASCET criteria, using the distal internal carotid diameter as the denominator. COMPARISON:  Prior CT from 03/17/2019 FINDINGS: MRI HEAD FINDINGS Diffuse prominence of the CSF containing spaces compatible with generalized age-related cerebral atrophy. Mild T2/FLAIR hyperintensity within the  periventricular white matter, most like related chronic micro vessel ischemic disease. Few small remote lacunar infarcts noted within the bilateral basal ganglia. Additional chronic lacunar infarct noted within the left ventral pons. Few additional small remote bilateral cerebellar infarcts noted. Scattered multifocal foci of restricted diffusion seen involving the bilateral cerebral hemispheres, consistent with acute ischemic infarcts. The most prominent of these positioned at the right basal ganglia/corona radiata and measures 18 mm (series 5, image 78). Remainder of the infarcts predominantly involve the deep and subcortical white matter, and are subcentimeter in size. No associated hemorrhage or mass effect. No acute infratentorial ischemia. No mass lesion, midline shift or mass effect. No hydrocephalus. No extra-axial fluid collection. Pituitary gland suprasellar region normal. Major intracranial vascular flow voids maintained. Intracranial dolichoectasia noted. Atlantooccipital assimilation. Craniocervical junction widely patent. Degenerative changes noted within the upper cervical spine with resultant mild to moderate spinal stenosis at C3-4. Bone marrow signal intensity normal. No scalp soft tissue abnormality. Patient status  post bilateral ocular lens replacement. Globes and orbital soft tissues demonstrate no acute finding. Paranasal sinuses are clear. No mastoid effusion. Inner ear structures grossly normal. MRA HEAD FINDINGS ANTERIOR CIRCULATION: Examination mildly degraded by motion artifact. Visualized distal cervical segments of the internal carotid arteries are patent with symmetric antegrade flow. Petrous, cavernous, and supraclinoid segments patent without hemodynamically significant stenosis. Origin of the ophthalmic arteries patent. ICA termini well perfused. A1 segments patent bilaterally. Normal anterior communicating artery. Anterior cerebral arteries tortuous but patent to their distal  aspects without stenosis. Right M1 widely patent. Left M1 patent proximally, but smoothly tapers distally. Superimposed short-segment moderate to severe distal left M1 stenosis just prior to the bifurcation (series 1028, image 12). Distal MCA branches perfused and symmetric. Scattered distal small vessel atheromatous irregularity. POSTERIOR CIRCULATION: Vertebral arteries patent to the vertebrobasilar junction without flow-limiting stenosis. Right vertebral artery slightly dominant. Posterior inferior cerebral arteries not seen. Left V4 segment tortuous. Basilar somewhat ectatic and tortuous but widely patent to its distal aspect without stenosis. Possible short-segment fenestration noted within the distal basilar artery. Superior cerebral arteries patent bilaterally. Left PCA supplied via the basilar. Right PCA supplied via a small right P1 as well as a prominent right posterior communicating artery. PCAs well perfused to their distal aspects without flow-limiting stenosis. MRA NECK FINDINGS Source images reviewed. Patent antegrade flow seen within the carotid and vertebral arteries bilaterally on time-of-flight sequence. Visualized aortic arch of normal caliber with normal branch pattern. No hemodynamically significant stenosis about the origin of the great vessels. Visualized subclavian arteries widely patent. Right common carotid artery patent from its origin to the bifurcation without stenosis. No significant atheromatous narrowing about the right bifurcation. Right ICA widely patent distally to the skull base without stenosis or vascular occlusion. Left common carotid artery patent from its origin to the bifurcation without stenosis. No significant atheromatous narrowing about the left bifurcation. Left ICA patent distally to the skull base without stenosis or occlusion. Both of the vertebral arteries arise from the subclavian arteries. Right vertebral artery dominant. Vertebral arteries tortuous bilaterally.  Dominant right vertebral artery widely patent. Short-segment moderate diffuse narrowing at the mid left V2 segment (series 1085, image 11), suspected to be related to extrinsic compression from uncovertebral disease. Left vertebral otherwise widely patent. IMPRESSION: MRI HEAD IMPRESSION: 1. Patchy multifocal ischemic infarcts involving the bilateral cerebral hemispheres as above, most prominent of which measures 18 mm involving the right basal ganglia. No associated hemorrhage or mass effect. A central thromboembolic etiology felt to be likely given the various vascular distributions involved. 2. Underlying atrophy with mild chronic microvascular ischemic disease, with a few scatter remote infarcts involving the bilateral basal ganglia, pons, and bilateral cerebellar hemispheres. MRA HEAD IMPRESSION: 1. Negative intracranial MRA for large vessel occlusion. 2. Single short-segment moderate to severe distal left M1 stenosis. No other hemodynamically significant or correctable stenosis identified. 3. 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 MRA NECK IMPRESSION: 1. Wide patency of the common and internal carotid arteries bilaterally. 2. Single focal moderate stenosis within the mid left V2 segment. Left vertebral arteries otherwise widely patent within the neck. Right vertebral artery dominant. Electronically Signed   By: Jeannine Boga M.D.   On: 03/18/2019 07:28   Mr Jodene Nam Neck W Wo Contrast  Result Date: 03/18/2019 CLINICAL DATA:  Initial evaluation for left leg weakness, difficulty speaking, worsening left facial droop, weakness. EXAM: MRI HEAD WITHOUT CONTRAST MRA HEAD WITHOUT CONTRAST MRA NECK WITHOUT AND WITH CONTRAST TECHNIQUE: Multiplanar, multiecho pulse  sequences of the brain and surrounding structures were obtained without intravenous contrast. Angiographic images of the Circle of Willis were obtained using MRA technique without intravenous contrast. Angiographic images of  the neck were obtained using MRA technique without and with intravenous contrast. Carotid stenosis measurements (when applicable) are obtained utilizing NASCET criteria, using the distal internal carotid diameter as the denominator. COMPARISON:  Prior CT from 03/17/2019 FINDINGS: MRI HEAD FINDINGS Diffuse prominence of the CSF containing spaces compatible with generalized age-related cerebral atrophy. Mild T2/FLAIR hyperintensity within the periventricular white matter, most like related chronic micro vessel ischemic disease. Few small remote lacunar infarcts noted within the bilateral basal ganglia. Additional chronic lacunar infarct noted within the left ventral pons. Few additional small remote bilateral cerebellar infarcts noted. Scattered multifocal foci of restricted diffusion seen involving the bilateral cerebral hemispheres, consistent with acute ischemic infarcts. The most prominent of these positioned at the right basal ganglia/corona radiata and measures 18 mm (series 5, image 78). Remainder of the infarcts predominantly involve the deep and subcortical white matter, and are subcentimeter in size. No associated hemorrhage or mass effect. No acute infratentorial ischemia. No mass lesion, midline shift or mass effect. No hydrocephalus. No extra-axial fluid collection. Pituitary gland suprasellar region normal. Major intracranial vascular flow voids maintained. Intracranial dolichoectasia noted. Atlantooccipital assimilation. Craniocervical junction widely patent. Degenerative changes noted within the upper cervical spine with resultant mild to moderate spinal stenosis at C3-4. Bone marrow signal intensity normal. No scalp soft tissue abnormality. Patient status post bilateral ocular lens replacement. Globes and orbital soft tissues demonstrate no acute finding. Paranasal sinuses are clear. No mastoid effusion. Inner ear structures grossly normal. MRA HEAD FINDINGS ANTERIOR CIRCULATION: Examination mildly  degraded by motion artifact. Visualized distal cervical segments of the internal carotid arteries are patent with symmetric antegrade flow. Petrous, cavernous, and supraclinoid segments patent without hemodynamically significant stenosis. Origin of the ophthalmic arteries patent. ICA termini well perfused. A1 segments patent bilaterally. Normal anterior communicating artery. Anterior cerebral arteries tortuous but patent to their distal aspects without stenosis. Right M1 widely patent. Left M1 patent proximally, but smoothly tapers distally. Superimposed short-segment moderate to severe distal left M1 stenosis just prior to the bifurcation (series 1028, image 12). Distal MCA branches perfused and symmetric. Scattered distal small vessel atheromatous irregularity. POSTERIOR CIRCULATION: Vertebral arteries patent to the vertebrobasilar junction without flow-limiting stenosis. Right vertebral artery slightly dominant. Posterior inferior cerebral arteries not seen. Left V4 segment tortuous. Basilar somewhat ectatic and tortuous but widely patent to its distal aspect without stenosis. Possible short-segment fenestration noted within the distal basilar artery. Superior cerebral arteries patent bilaterally. Left PCA supplied via the basilar. Right PCA supplied via a small right P1 as well as a prominent right posterior communicating artery. PCAs well perfused to their distal aspects without flow-limiting stenosis. MRA NECK FINDINGS Source images reviewed. Patent antegrade flow seen within the carotid and vertebral arteries bilaterally on time-of-flight sequence. Visualized aortic arch of normal caliber with normal branch pattern. No hemodynamically significant stenosis about the origin of the great vessels. Visualized subclavian arteries widely patent. Right common carotid artery patent from its origin to the bifurcation without stenosis. No significant atheromatous narrowing about the right bifurcation. Right ICA widely  patent distally to the skull base without stenosis or vascular occlusion. Left common carotid artery patent from its origin to the bifurcation without stenosis. No significant atheromatous narrowing about the left bifurcation. Left ICA patent distally to the skull base without stenosis or occlusion. Both of the vertebral arteries  arise from the subclavian arteries. Right vertebral artery dominant. Vertebral arteries tortuous bilaterally. Dominant right vertebral artery widely patent. Short-segment moderate diffuse narrowing at the mid left V2 segment (series 1085, image 11), suspected to be related to extrinsic compression from uncovertebral disease. Left vertebral otherwise widely patent. IMPRESSION: MRI HEAD IMPRESSION: 1. Patchy multifocal ischemic infarcts involving the bilateral cerebral hemispheres as above, most prominent of which measures 18 mm involving the right basal ganglia. No associated hemorrhage or mass effect. A central thromboembolic etiology felt to be likely given the various vascular distributions involved. 2. Underlying atrophy with mild chronic microvascular ischemic disease, with a few scatter remote infarcts involving the bilateral basal ganglia, pons, and bilateral cerebellar hemispheres. MRA HEAD IMPRESSION: 1. Negative intracranial MRA for large vessel occlusion. 2. Single short-segment moderate to severe distal left M1 stenosis. No other hemodynamically significant or correctable stenosis identified. 3. 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 MRA NECK IMPRESSION: 1. Wide patency of the common and internal carotid arteries bilaterally. 2. Single focal moderate stenosis within the mid left V2 segment. Left vertebral arteries otherwise widely patent within the neck. Right vertebral artery dominant. Electronically Signed   By: Jeannine Boga M.D.   On: 03/18/2019 07:28   Mr Brain Wo Contrast  Result Date: 03/18/2019 CLINICAL DATA:  Initial evaluation for  left leg weakness, difficulty speaking, worsening left facial droop, weakness. EXAM: MRI HEAD WITHOUT CONTRAST MRA HEAD WITHOUT CONTRAST MRA NECK WITHOUT AND WITH CONTRAST TECHNIQUE: Multiplanar, multiecho pulse sequences of the brain and surrounding structures were obtained without intravenous contrast. Angiographic images of the Circle of Willis were obtained using MRA technique without intravenous contrast. Angiographic images of the neck were obtained using MRA technique without and with intravenous contrast. Carotid stenosis measurements (when applicable) are obtained utilizing NASCET criteria, using the distal internal carotid diameter as the denominator. COMPARISON:  Prior CT from 03/17/2019 FINDINGS: MRI HEAD FINDINGS Diffuse prominence of the CSF containing spaces compatible with generalized age-related cerebral atrophy. Mild T2/FLAIR hyperintensity within the periventricular white matter, most like related chronic micro vessel ischemic disease. Few small remote lacunar infarcts noted within the bilateral basal ganglia. Additional chronic lacunar infarct noted within the left ventral pons. Few additional small remote bilateral cerebellar infarcts noted. Scattered multifocal foci of restricted diffusion seen involving the bilateral cerebral hemispheres, consistent with acute ischemic infarcts. The most prominent of these positioned at the right basal ganglia/corona radiata and measures 18 mm (series 5, image 78). Remainder of the infarcts predominantly involve the deep and subcortical white matter, and are subcentimeter in size. No associated hemorrhage or mass effect. No acute infratentorial ischemia. No mass lesion, midline shift or mass effect. No hydrocephalus. No extra-axial fluid collection. Pituitary gland suprasellar region normal. Major intracranial vascular flow voids maintained. Intracranial dolichoectasia noted. Atlantooccipital assimilation. Craniocervical junction widely patent. Degenerative  changes noted within the upper cervical spine with resultant mild to moderate spinal stenosis at C3-4. Bone marrow signal intensity normal. No scalp soft tissue abnormality. Patient status post bilateral ocular lens replacement. Globes and orbital soft tissues demonstrate no acute finding. Paranasal sinuses are clear. No mastoid effusion. Inner ear structures grossly normal. MRA HEAD FINDINGS ANTERIOR CIRCULATION: Examination mildly degraded by motion artifact. Visualized distal cervical segments of the internal carotid arteries are patent with symmetric antegrade flow. Petrous, cavernous, and supraclinoid segments patent without hemodynamically significant stenosis. Origin of the ophthalmic arteries patent. ICA termini well perfused. A1 segments patent bilaterally. Normal anterior communicating artery. Anterior cerebral arteries tortuous but patent to  their distal aspects without stenosis. Right M1 widely patent. Left M1 patent proximally, but smoothly tapers distally. Superimposed short-segment moderate to severe distal left M1 stenosis just prior to the bifurcation (series 1028, image 12). Distal MCA branches perfused and symmetric. Scattered distal small vessel atheromatous irregularity. POSTERIOR CIRCULATION: Vertebral arteries patent to the vertebrobasilar junction without flow-limiting stenosis. Right vertebral artery slightly dominant. Posterior inferior cerebral arteries not seen. Left V4 segment tortuous. Basilar somewhat ectatic and tortuous but widely patent to its distal aspect without stenosis. Possible short-segment fenestration noted within the distal basilar artery. Superior cerebral arteries patent bilaterally. Left PCA supplied via the basilar. Right PCA supplied via a small right P1 as well as a prominent right posterior communicating artery. PCAs well perfused to their distal aspects without flow-limiting stenosis. MRA NECK FINDINGS Source images reviewed. Patent antegrade flow seen within the  carotid and vertebral arteries bilaterally on time-of-flight sequence. Visualized aortic arch of normal caliber with normal branch pattern. No hemodynamically significant stenosis about the origin of the great vessels. Visualized subclavian arteries widely patent. Right common carotid artery patent from its origin to the bifurcation without stenosis. No significant atheromatous narrowing about the right bifurcation. Right ICA widely patent distally to the skull base without stenosis or vascular occlusion. Left common carotid artery patent from its origin to the bifurcation without stenosis. No significant atheromatous narrowing about the left bifurcation. Left ICA patent distally to the skull base without stenosis or occlusion. Both of the vertebral arteries arise from the subclavian arteries. Right vertebral artery dominant. Vertebral arteries tortuous bilaterally. Dominant right vertebral artery widely patent. Short-segment moderate diffuse narrowing at the mid left V2 segment (series 1085, image 11), suspected to be related to extrinsic compression from uncovertebral disease. Left vertebral otherwise widely patent. IMPRESSION: MRI HEAD IMPRESSION: 1. Patchy multifocal ischemic infarcts involving the bilateral cerebral hemispheres as above, most prominent of which measures 18 mm involving the right basal ganglia. No associated hemorrhage or mass effect. A central thromboembolic etiology felt to be likely given the various vascular distributions involved. 2. Underlying atrophy with mild chronic microvascular ischemic disease, with a few scatter remote infarcts involving the bilateral basal ganglia, pons, and bilateral cerebellar hemispheres. MRA HEAD IMPRESSION: 1. Negative intracranial MRA for large vessel occlusion. 2. Single short-segment moderate to severe distal left M1 stenosis. No other hemodynamically significant or correctable stenosis identified. 3. 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 MRA NECK IMPRESSION: 1. Wide patency of the common and internal carotid arteries bilaterally. 2. Single focal moderate stenosis within the mid left V2 segment. Left vertebral arteries otherwise widely patent within the neck. Right vertebral artery dominant. Electronically Signed   By: Jeannine Boga M.D.   On: 03/18/2019 07:28   Dg Foot 2 Views Left  Result Date: 03/17/2019 CLINICAL DATA:  Fall EXAM: LEFT FOOT - 2 VIEW COMPARISON:  09/26/2015 FINDINGS: Bones appear osteopenic. No acute displaced fracture or malalignment. Mild joint space narrowing at the first MTP joint. Mild narrowing at the PIP and DIP joints. IMPRESSION: No acute osseous abnormality Electronically Signed   By: Donavan Foil M.D.   On: 03/17/2019 22:49   Dg Swallowing Func-speech Pathology  Result Date: 03/18/2019 Objective Swallowing Evaluation: Type of Study: MBS-Modified Barium Swallow Study  Patient Details Name: DELANTE KARAPETYAN MRN: [id number] Date of Birth: [birth year] Today's Date: 03/18/2019 Time: SLP Start Time (ACUTE ONLY): 1030 -SLP Stop Time (ACUTE ONLY): 1055 SLP Time Calculation (min) (ACUTE ONLY): 25 min Past Medical History: Past Medical History: Diagnosis  Date  CAD (coronary artery disease)   Cancer ([place])   lung  Diabetes mellitus   History of blood clots   Prostate enlargement   Pulmonary embolus [facility]) july 2005 Past Surgical History: Past Surgical History: Procedure Laterality Date  COLONOSCOPY N/A 07/24/2015  Procedure: COLONOSCOPY;  Surgeon: Laurence Spates, MD;  Location: [facility];  Service: Endoscopy;  Laterality: N/A;  CORONARY STENT PLACEMENT    ESOPHAGOGASTRODUODENOSCOPY N/A 07/24/2015  Procedure: ESOPHAGOGASTRODUODENOSCOPY (EGD);  Surgeon: Laurence Spates, MD;  Location: [facility] ENDOSCOPY;  Service: Endoscopy;  Laterality: N/A;  bleeding source not in colon; EGD performed to locate source  FILTERING PROCEDURE    reports filter placed after knee surgery for blood clots  I&D EXTREMITY  08/16/2012   Procedure: MINOR IRRIGATION AND DEBRIDEMENT EXTREMITY;  Surgeon: Tennis Must, MD;  Location: [facility];  Service: Orthopedics;  Laterality: Right;  KNEE SURGERY    LUNG REMOVAL, PARTIAL   HPI:  BASEM YANNUZZI is a 85 y.o. male with medical history significant of stroke, diabetes mellitus, COPD, GERD, CAD, stent placement, lung cancer in remission (partial lumpectomy), BPH, remote PE, dCHF, GI bleeding, hard of hearing, who presented to ER with left leg weakness, difficulty speaking, worsening left facial droop, fall, generalized weakness. 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 Discharged from [facility] with mechanical soft diet and nectar thickened liquids. MRI shows patchy multifocal ischemic infarcts involving the bilateral cerebral hemispheres.  Subjective: alert, cooperative Assessment / Plan / Recommendation CHL IP CLINICAL IMPRESSIONS 03/18/2019 Clinical Impression Patient hospitalized with CVA and Dysphagia from previous CVA in 2015. Patient presents with mild to moderate oropharyngeal dsyphagia. Oral phase characterized by poor bolus formation with all liquids, poor tongue elevation and propulsion of bolus, spillage to pyriform sinuses on both thin liquids and nectar thickened liquids (delayed swallow). Penetration and aspiration on larger thin liquid cup sips during swallow with no sensation. Inability to clear with a cued cough. Mild pharyngeal coating with liquids. Swallow triggered with solids as bolus passes tip of epiglottis. Complete epiglottis inversion and laryngeal closure. Adequate UES opening with no pyriform residue. Esophageal sweep showed good clearance. Recommend Regular diet with nectar thickened liquds, medications to be given in whole in puree. Patient to sit at 90 degrees for all meals. Speech therapy to follow up dysphagia therapy, diet tolerance and upgrades. Patient seemingly understood diet recommendation and aspiration precautions, despite his age  and recent CVA. Nurse notified of diet and aspiration precautions.  SLP Visit Diagnosis Dysphagia, oropharyngeal phase (R13.12) Attention and concentration deficit following -- Frontal lobe and executive function deficit following -- Impact on safety and function Moderate aspiration risk   CHL IP TREATMENT RECOMMENDATION 03/18/2019 Treatment Recommendations F/U MBS in --- days (Comment);Defer treatment plan to f/u with SLP   Prognosis 03/18/2019 Prognosis for Safe Diet Advancement Good Barriers to Reach Goals -- Barriers/Prognosis Comment -- CHL IP DIET RECOMMENDATION 03/18/2019 SLP Diet Recommendations Regular solids;Nectar thick liquid Liquid Administration via Cup;No straw Medication Administration Whole meds with puree Compensations Slow rate;Small sips/bites Postural Changes Seated upright at 90 degrees   CHL IP OTHER RECOMMENDATIONS 03/18/2019 Recommended Consults -- Oral Care Recommendations Oral care BID Other Recommendations Order thickener from pharmacy   CHL IP FOLLOW UP RECOMMENDATIONS 11/20/2014 Follow up Recommendations Inpatient Rehab   CHL IP FREQUENCY AND DURATION 03/18/2019 Speech Therapy Frequency (ACUTE ONLY) min 2x/week Treatment Duration 2 weeks      CHL IP ORAL PHASE 03/18/2019 Oral Phase Impaired Oral - Pudding Teaspoon -- Oral -  Pudding Cup -- Oral - Honey Teaspoon -- Oral - Honey Cup -- Oral - Nectar Teaspoon -- Oral - Nectar Cup Weak lingual manipulation;Incomplete tongue to palate contact;Reduced posterior propulsion Oral - Nectar Straw -- Oral - Thin Teaspoon -- Oral - Thin Cup Weak lingual manipulation;Incomplete tongue to palate contact;Reduced posterior propulsion Oral - Thin Straw -- Oral - Puree Incomplete tongue to palate contact;Reduced posterior propulsion Oral - Mech Soft Weak lingual manipulation;Incomplete tongue to palate contact;Reduced posterior propulsion Oral - Regular -- Oral - Multi-Consistency -- Oral - Pill -- Oral Phase - Comment --  CHL IP PHARYNGEAL PHASE 03/18/2019  Pharyngeal Phase Impaired Pharyngeal- Pudding Teaspoon Delayed swallow initiation-vallecula Pharyngeal -- Pharyngeal- Pudding Cup -- Pharyngeal -- Pharyngeal- Honey Teaspoon -- Pharyngeal -- Pharyngeal- Honey Cup -- Pharyngeal -- Pharyngeal- Nectar Teaspoon -- Pharyngeal -- Pharyngeal- Nectar Cup Delayed swallow initiation-pyriform sinuses Pharyngeal -- Pharyngeal- Nectar Straw -- Pharyngeal -- Pharyngeal- Thin Teaspoon -- Pharyngeal -- Pharyngeal- Thin Cup Delayed swallow initiation-pyriform sinuses;Penetration/Aspiration during swallow Pharyngeal -- Pharyngeal- Thin Straw -- Pharyngeal -- Pharyngeal- Puree -- Pharyngeal -- Pharyngeal- Mechanical Soft Delayed swallow initiation-vallecula Pharyngeal -- Pharyngeal- Regular -- Pharyngeal -- Pharyngeal- Multi-consistency -- Pharyngeal -- Pharyngeal- Pill -- Pharyngeal -- Pharyngeal Comment --  CHL IP CERVICAL ESOPHAGEAL PHASE 03/18/2019 Cervical Esophageal Phase WFL Pudding Teaspoon -- Pudding Cup -- Honey Teaspoon -- Honey Cup -- Nectar Teaspoon -- Nectar Cup -- Nectar Straw -- Thin Teaspoon -- Thin Cup -- Thin Straw -- Puree -- Mechanical Soft -- Regular -- Multi-consistency -- Pill -- Cervical Esophageal Comment -- Charlynne Cousins Ward 03/18/2019, 11:17 AM               DISCHARGE EXAMINATION: Vitals:   03/20/19 1938 03/21/19 0008 03/21/19 0400 03/21/19 0819  BP: (!) 149/78 (!) 160/89 140/79 (!) 142/81  Pulse: 82 82  81  Resp: 20 17 18 17   Temp: 97.9 F (36.6 C) 97.8 F (36.6 C) 97.9 F (36.6 C) 98.5 F (36.9 C)  TempSrc: Oral Oral Oral Oral  SpO2: 100% 99% 100% 98%  Weight:      Height:       General appearance: Awake alert.  In no distress Resp: Clear to auscultation bilaterally.  Normal effort Cardio: S1-S2 is normal regular.  No S3-S4.  No rubs murmurs or bruit GI: Abdomen is soft.  Nontender nondistended.  Bowel sounds are present normal.  No masses organomegaly Extremities: No edema.  Full range of motion of lower extremities.   DISPOSITION:  SNF  Discharge Instructions    Ambulatory referral to Neurology   Complete by:  As directed    An appointment is requested in approximately: 4 weeks   Call MD for:  difficulty breathing, headache or visual disturbances   Complete by:  As directed    Call MD for:  extreme fatigue   Complete by:  As directed    Call MD for:  persistant dizziness or light-headedness   Complete by:  As directed    Call MD for:  persistant nausea and vomiting   Complete by:  As directed    Call MD for:  severe uncontrolled pain   Complete by:  As directed    Call MD for:  temperature >100.4   Complete by:  As directed    Discharge instructions   Complete by:  As directed    Please review instructions on the discharge summary  You were cared for by a hospitalist during your hospital stay. If you have any questions about your  discharge medications or the care you received while you were in the hospital after you are discharged, you can call the unit and asked to speak with the hospitalist on call if the hospitalist that took care of you is not available. Once you are discharged, your primary care physician will handle any further medical issues. Please note that NO REFILLS for any discharge medications will be authorized once you are discharged, as it is imperative that you return to your primary care physician (or establish a relationship with a primary care physician if you do not have one) for your aftercare needs so that they can reassess your need for medications and monitor your lab values. If you do not have a primary care physician, you can call [phone] for a physician referral.   Increase activity slowly   Complete by:  As directed          Allergies as of 03/21/2019      Reactions   Pantoprazole Diarrhea   Protonix [pantoprazole Sodium] Diarrhea      Medication List    STOP taking these medications   aspirin 81 MG tablet   Glucosamine-Chondroitin 500-400 MG Caps   meloxicam 7.5 MG  tablet Commonly known as:  MOBIC   metFORMIN 500 MG 24 hr tablet Commonly known as:  GLUCOPHAGE-XR   pantoprazole 40 MG tablet Commonly known as:  PROTONIX   polyethylene glycol packet Commonly known as:  MIRALAX / GLYCOLAX   tamsulosin 0.4 MG Caps capsule Commonly known as:  FLOMAX   Tresiba FlexTouch 200 UNIT/ML Sopn Generic drug:  Insulin Degludec     TAKE these medications   Accu-Chek Aviva Plus test strip Generic drug:  glucose blood AS DIRECTED DX E11.65 THREE TIMES A DAY 90 DAYS   alfuzosin 10 MG 24 hr tablet Commonly known as:  UROXATRAL Take 10 mg by mouth daily with breakfast.   B-D ULTRAFINE III SHORT PEN 31G X 8 MM Misc Generic drug:  Insulin Pen Needle USE ONCE A DAY WITH TRESIBA FLEXPEN   ezetimibe 10 MG tablet Commonly known as:  ZETIA Take 10 mg by mouth daily.   finasteride 5 MG tablet Commonly known as:  PROSCAR TAKE 1 TABLET BY MOUTH EVERY DAY   furosemide 40 MG tablet Commonly known as:  LASIX Take 0.5 tablets (20 mg total) by mouth daily. What changed:    how much to take  when to take this   insulin glargine 100 UNIT/ML injection Commonly known as:  LANTUS Inject 0.3 mLs (30 Units total) into the skin daily. What changed:  how much to take   losartan-hydrochlorothiazide 50-12.5 MG tablet Commonly known as:  HYZAAR Take 1 tablet by mouth daily.   multivitamins ther. w/minerals Tabs tablet Take by mouth.   vitamin B-12 1000 MCG tablet Commonly known as:  CYANOCOBALAMIN Take 2,000 mcg by mouth daily.   Vitamin D 50 MCG (2000 UT) Caps Take 2,000 Units by mouth daily.        Follow-up Information    [facility] Follow up.   Specialty:  Cardiology Why:  04/17/2019 @ 11:30AM, woun check visit Contact information: [address], [place] [phone]          TOTAL DISCHARGE TIME: 35 minutes  [facility] Pager on  [email or web address]  03/21/2019, 9:24 AM

## 2019-03-21 NOTE — Progress Notes (Addendum)
Patient is discharging to [facility]. [facility] is here for transport. Discharge paperwork sent with patient. All belongings sent with patient. Family notified on patient being transported. Nurse has called report to [place], Therapist, sports. [place]

## 2019-03-26 DIAGNOSIS — I2510 Atherosclerotic heart disease of native coronary artery without angina pectoris: Secondary | ICD-10-CM | POA: Diagnosis not present

## 2019-03-26 DIAGNOSIS — J449 Chronic obstructive pulmonary disease, unspecified: Secondary | ICD-10-CM | POA: Diagnosis not present

## 2019-03-26 DIAGNOSIS — E1151 Type 2 diabetes mellitus with diabetic peripheral angiopathy without gangrene: Secondary | ICD-10-CM | POA: Diagnosis not present

## 2019-03-26 DIAGNOSIS — E46 Unspecified protein-calorie malnutrition: Secondary | ICD-10-CM | POA: Diagnosis not present

## 2019-03-26 DIAGNOSIS — I639 Cerebral infarction, unspecified: Secondary | ICD-10-CM | POA: Diagnosis not present

## 2019-03-26 DIAGNOSIS — L89620 Pressure ulcer of left heel, unstageable: Secondary | ICD-10-CM | POA: Diagnosis not present

## 2019-03-26 DIAGNOSIS — R2681 Unsteadiness on feet: Secondary | ICD-10-CM | POA: Diagnosis not present

## 2019-03-26 DIAGNOSIS — D649 Anemia, unspecified: Secondary | ICD-10-CM | POA: Diagnosis not present

## 2019-03-26 DIAGNOSIS — I5032 Chronic diastolic (congestive) heart failure: Secondary | ICD-10-CM | POA: Diagnosis not present

## 2019-03-27 ENCOUNTER — Other Ambulatory Visit: Payer: Self-pay

## 2019-03-27 NOTE — Patient Outreach (Signed)
[facility]) Care Management  03/27/2019  Chad Fuller 23-Aug-1926 [id number]   EMMI- Stroke RED ON EMMI ALERT Day # 3 Date: 03/24/2019 Red Alert Reason:  New problems walking/talking/speaking/seeing? Yes    Outreach attempt: no answer. HIPAA compliant voice message left.     Plan: RN CM will attempt again within 4 business days and send letter.  [name], RN, MSN [facility] Care Management Care Management Coordinator Direct Line [phone] Toll Free: [phone]  Fax: [phone]

## 2019-03-28 ENCOUNTER — Other Ambulatory Visit: Payer: Self-pay

## 2019-03-28 DIAGNOSIS — L89620 Pressure ulcer of left heel, unstageable: Secondary | ICD-10-CM | POA: Diagnosis not present

## 2019-03-28 NOTE — Patient Outreach (Signed)
[facility]) Care Management  03/28/2019  Chad Fuller [birth year] [id number]   EMMI- Stroke RED ON EMMI ALERT Day # 3 Date:03/24/2019 Red Alert Reason: New problems walking/talking/speaking/seeing? Yes    Outreach attempt: no answer. Unable to leave a message.   Plan: RN CM will attempt again within 4 business days.  [name], RN, MSN [place] Management Care Management Coordinator Direct Line [phone] Cell [phone] Toll Free: [phone]  Fax: [phone]

## 2019-03-30 ENCOUNTER — Other Ambulatory Visit: Payer: Self-pay | Admitting: *Deleted

## 2019-03-30 ENCOUNTER — Other Ambulatory Visit: Payer: Self-pay

## 2019-03-30 NOTE — Patient Outreach (Signed)
[facility]) Care Management  03/30/2019  ORTON CAPELL 11-18-1926 [id number]   Member screened for potential [facility] Care Management services while at [facility].   Collaboration with [place]. Mr. Dehoyos remains at [facility]. Disposition plans are not known at the time. Per [facility] UM has supportive children.  Noted outreaches have been made by [facility] due to [facility] alert. Notification sent to [facility] to make aware member is currently in SNF.  Will continue to follow and collaborate with [facility] UM. Will make [place] Management referral when/if appropriate.   Marthenia Rolling, MSN-Ed, RN,BSN [place] Acute Care Coordinator [phone]

## 2019-03-30 NOTE — Patient Outreach (Addendum)
[facility]) Care Management  03/30/2019  Chad Fuller April 12, 1926 [id number]   EMMI-Stroke RED ON EMMI ALERT Day #3 Date:03/24/2019 Red Alert Reason: New problems walking/talking/speaking/seeing? Yes    Outreach attempt:no answer. HIPAA compliant voice message left.   Plan: RN CM willwait return call. If no return call will close case.    Addendum: Received in basket from [place].  Patient is currently at [facility] [place] and will be referred to [facility] upon discharge.    Plan: RN CM will sign off case at this time.    [name], RN, MSN [place] Management Care Management Coordinator Direct Line [phone] Cell [phone] Toll Free: [phone]  Fax: [phone]

## 2019-04-04 DIAGNOSIS — L89620 Pressure ulcer of left heel, unstageable: Secondary | ICD-10-CM | POA: Diagnosis not present

## 2019-04-06 ENCOUNTER — Other Ambulatory Visit: Payer: Self-pay | Admitting: *Deleted

## 2019-04-06 NOTE — Patient Outreach (Signed)
[facility]) Care Management  04/06/2019  DARWYN PONZO December 10, 1926 [id number]    Latest update from [facility] UM collaboration. Mr. Scarlata remains at [facility]. Likely plan is to transition to ALF at facility.   Will plan to follow until member transitions to ALF or if disposition plans change.  Will continue to collaborate with [facility] UM and facility.   Marthenia Rolling, MSN-Ed, RN,BSN [place] Acute Care Coordinator [phone]

## 2019-04-07 ENCOUNTER — Telehealth: Payer: Self-pay | Admitting: Nurse Practitioner

## 2019-04-07 NOTE — Telephone Encounter (Signed)
Left message on voicemail regarding mr Chad Fuller appointment for 04/17/19 with the device clinic- Per Amber it is ok for patient to send picture of his wound through Mychart instead of coming into office

## 2019-04-09 DIAGNOSIS — L89620 Pressure ulcer of left heel, unstageable: Secondary | ICD-10-CM | POA: Diagnosis not present

## 2019-04-09 DIAGNOSIS — R5382 Chronic fatigue, unspecified: Secondary | ICD-10-CM | POA: Diagnosis not present

## 2019-04-09 DIAGNOSIS — E46 Unspecified protein-calorie malnutrition: Secondary | ICD-10-CM | POA: Diagnosis not present

## 2019-04-11 DIAGNOSIS — L89620 Pressure ulcer of left heel, unstageable: Secondary | ICD-10-CM | POA: Diagnosis not present

## 2019-04-12 ENCOUNTER — Other Ambulatory Visit: Payer: Self-pay | Admitting: *Deleted

## 2019-04-12 NOTE — Patient Outreach (Signed)
[facility]) Care Management  04/12/2019  Chad Fuller January 21, 1926 [id number]   Collaboration with [facility] UM. Writer made aware, per facility, that Mr. Larch will transition to ALF at [facility] on 04/12/19.  Therefore, there are no current identifiable [facility] Care Management needs.  Plan to sign off.   Marthenia Rolling, MSN-Ed, RN,BSN [place] Acute Care Coordinator [phone]

## 2019-04-12 NOTE — Telephone Encounter (Signed)
Left second message on voicemail for Chad Fuller regarding his appt for April 17, 2019, left office number and [name] number for call back numbers.

## 2019-04-13 DIAGNOSIS — I69354 Hemiplegia and hemiparesis following cerebral infarction affecting left non-dominant side: Secondary | ICD-10-CM | POA: Diagnosis not present

## 2019-04-13 DIAGNOSIS — N179 Acute kidney failure, unspecified: Secondary | ICD-10-CM | POA: Diagnosis not present

## 2019-04-13 DIAGNOSIS — M6281 Muscle weakness (generalized): Secondary | ICD-10-CM | POA: Diagnosis not present

## 2019-04-13 DIAGNOSIS — R278 Other lack of coordination: Secondary | ICD-10-CM | POA: Diagnosis not present

## 2019-04-13 DIAGNOSIS — R2681 Unsteadiness on feet: Secondary | ICD-10-CM | POA: Diagnosis not present

## 2019-04-13 DIAGNOSIS — E1151 Type 2 diabetes mellitus with diabetic peripheral angiopathy without gangrene: Secondary | ICD-10-CM | POA: Diagnosis not present

## 2019-04-13 DIAGNOSIS — L89620 Pressure ulcer of left heel, unstageable: Secondary | ICD-10-CM | POA: Diagnosis not present

## 2019-04-13 DIAGNOSIS — Z85118 Personal history of other malignant neoplasm of bronchus and lung: Secondary | ICD-10-CM | POA: Diagnosis not present

## 2019-04-13 DIAGNOSIS — J449 Chronic obstructive pulmonary disease, unspecified: Secondary | ICD-10-CM | POA: Diagnosis not present

## 2019-04-14 DIAGNOSIS — I69354 Hemiplegia and hemiparesis following cerebral infarction affecting left non-dominant side: Secondary | ICD-10-CM | POA: Diagnosis not present

## 2019-04-14 DIAGNOSIS — J449 Chronic obstructive pulmonary disease, unspecified: Secondary | ICD-10-CM | POA: Diagnosis not present

## 2019-04-14 DIAGNOSIS — R2681 Unsteadiness on feet: Secondary | ICD-10-CM | POA: Diagnosis not present

## 2019-04-14 DIAGNOSIS — N179 Acute kidney failure, unspecified: Secondary | ICD-10-CM | POA: Diagnosis not present

## 2019-04-14 DIAGNOSIS — M6281 Muscle weakness (generalized): Secondary | ICD-10-CM | POA: Diagnosis not present

## 2019-04-14 DIAGNOSIS — R278 Other lack of coordination: Secondary | ICD-10-CM | POA: Diagnosis not present

## 2019-04-17 ENCOUNTER — Ambulatory Visit: Payer: Medicare Other

## 2019-04-24 ENCOUNTER — Ambulatory Visit (INDEPENDENT_AMBULATORY_CARE_PROVIDER_SITE_OTHER): Payer: Medicare Other | Admitting: *Deleted

## 2019-04-24 ENCOUNTER — Other Ambulatory Visit: Payer: Self-pay

## 2019-04-24 DIAGNOSIS — I5032 Chronic diastolic (congestive) heart failure: Secondary | ICD-10-CM

## 2019-04-24 DIAGNOSIS — I6340 Cerebral infarction due to embolism of unspecified cerebral artery: Secondary | ICD-10-CM

## 2019-04-24 LAB — CUP PACEART REMOTE DEVICE CHECK
Date Time Interrogation Session: 20200425141018
Implantable Pulse Generator Implant Date: 20200323

## 2019-04-26 DIAGNOSIS — E1151 Type 2 diabetes mellitus with diabetic peripheral angiopathy without gangrene: Secondary | ICD-10-CM | POA: Diagnosis not present

## 2019-04-26 DIAGNOSIS — I2510 Atherosclerotic heart disease of native coronary artery without angina pectoris: Secondary | ICD-10-CM | POA: Diagnosis not present

## 2019-04-26 DIAGNOSIS — Z7982 Long term (current) use of aspirin: Secondary | ICD-10-CM | POA: Diagnosis not present

## 2019-04-26 DIAGNOSIS — E538 Deficiency of other specified B group vitamins: Secondary | ICD-10-CM | POA: Diagnosis not present

## 2019-04-26 DIAGNOSIS — I69354 Hemiplegia and hemiparesis following cerebral infarction affecting left non-dominant side: Secondary | ICD-10-CM | POA: Diagnosis not present

## 2019-04-26 DIAGNOSIS — D649 Anemia, unspecified: Secondary | ICD-10-CM | POA: Diagnosis not present

## 2019-04-26 DIAGNOSIS — Z9181 History of falling: Secondary | ICD-10-CM | POA: Diagnosis not present

## 2019-04-26 DIAGNOSIS — L89620 Pressure ulcer of left heel, unstageable: Secondary | ICD-10-CM | POA: Diagnosis not present

## 2019-04-26 DIAGNOSIS — H9193 Unspecified hearing loss, bilateral: Secondary | ICD-10-CM | POA: Diagnosis not present

## 2019-04-26 DIAGNOSIS — K5790 Diverticulosis of intestine, part unspecified, without perforation or abscess without bleeding: Secondary | ICD-10-CM | POA: Diagnosis not present

## 2019-04-26 DIAGNOSIS — Z86711 Personal history of pulmonary embolism: Secondary | ICD-10-CM | POA: Diagnosis not present

## 2019-04-26 DIAGNOSIS — J449 Chronic obstructive pulmonary disease, unspecified: Secondary | ICD-10-CM | POA: Diagnosis not present

## 2019-04-26 DIAGNOSIS — Z993 Dependence on wheelchair: Secondary | ICD-10-CM | POA: Diagnosis not present

## 2019-04-26 DIAGNOSIS — R32 Unspecified urinary incontinence: Secondary | ICD-10-CM | POA: Diagnosis not present

## 2019-04-26 DIAGNOSIS — R1312 Dysphagia, oropharyngeal phase: Secondary | ICD-10-CM | POA: Diagnosis not present

## 2019-04-26 DIAGNOSIS — I110 Hypertensive heart disease with heart failure: Secondary | ICD-10-CM | POA: Diagnosis not present

## 2019-04-26 DIAGNOSIS — Z794 Long term (current) use of insulin: Secondary | ICD-10-CM | POA: Diagnosis not present

## 2019-04-26 DIAGNOSIS — F329 Major depressive disorder, single episode, unspecified: Secondary | ICD-10-CM | POA: Diagnosis not present

## 2019-04-26 DIAGNOSIS — I5032 Chronic diastolic (congestive) heart failure: Secondary | ICD-10-CM | POA: Diagnosis not present

## 2019-04-26 DIAGNOSIS — I951 Orthostatic hypotension: Secondary | ICD-10-CM | POA: Diagnosis not present

## 2019-04-26 DIAGNOSIS — Z85118 Personal history of other malignant neoplasm of bronchus and lung: Secondary | ICD-10-CM | POA: Diagnosis not present

## 2019-04-26 DIAGNOSIS — N400 Enlarged prostate without lower urinary tract symptoms: Secondary | ICD-10-CM | POA: Diagnosis not present

## 2019-04-28 DIAGNOSIS — I69354 Hemiplegia and hemiparesis following cerebral infarction affecting left non-dominant side: Secondary | ICD-10-CM | POA: Diagnosis not present

## 2019-04-28 DIAGNOSIS — I110 Hypertensive heart disease with heart failure: Secondary | ICD-10-CM | POA: Diagnosis not present

## 2019-04-28 DIAGNOSIS — E1151 Type 2 diabetes mellitus with diabetic peripheral angiopathy without gangrene: Secondary | ICD-10-CM | POA: Diagnosis not present

## 2019-04-28 DIAGNOSIS — I951 Orthostatic hypotension: Secondary | ICD-10-CM | POA: Diagnosis not present

## 2019-04-28 DIAGNOSIS — I5032 Chronic diastolic (congestive) heart failure: Secondary | ICD-10-CM | POA: Diagnosis not present

## 2019-04-28 DIAGNOSIS — J449 Chronic obstructive pulmonary disease, unspecified: Secondary | ICD-10-CM | POA: Diagnosis not present

## 2019-05-01 DIAGNOSIS — I69354 Hemiplegia and hemiparesis following cerebral infarction affecting left non-dominant side: Secondary | ICD-10-CM | POA: Diagnosis not present

## 2019-05-01 DIAGNOSIS — I5032 Chronic diastolic (congestive) heart failure: Secondary | ICD-10-CM | POA: Diagnosis not present

## 2019-05-01 DIAGNOSIS — I951 Orthostatic hypotension: Secondary | ICD-10-CM | POA: Diagnosis not present

## 2019-05-01 DIAGNOSIS — E1151 Type 2 diabetes mellitus with diabetic peripheral angiopathy without gangrene: Secondary | ICD-10-CM | POA: Diagnosis not present

## 2019-05-01 DIAGNOSIS — I110 Hypertensive heart disease with heart failure: Secondary | ICD-10-CM | POA: Diagnosis not present

## 2019-05-01 DIAGNOSIS — J449 Chronic obstructive pulmonary disease, unspecified: Secondary | ICD-10-CM | POA: Diagnosis not present

## 2019-05-02 ENCOUNTER — Telehealth: Payer: Self-pay

## 2019-05-02 NOTE — Telephone Encounter (Signed)
I called pts daughter and she stated her father is in [facility] assist living at [phone]. I spoke with Shenita and explain pt has an appt 05/12/2019 for hospital follow up and it needs to be reschedule because office is closed. I stated to do COVID 19 we are only doing video visit. She stated the supervisor will call us back about.

## 2019-05-02 NOTE — Progress Notes (Signed)
Carelink Summary Report / Loop Recorder 

## 2019-05-03 DIAGNOSIS — E1151 Type 2 diabetes mellitus with diabetic peripheral angiopathy without gangrene: Secondary | ICD-10-CM | POA: Diagnosis not present

## 2019-05-03 DIAGNOSIS — I5032 Chronic diastolic (congestive) heart failure: Secondary | ICD-10-CM | POA: Diagnosis not present

## 2019-05-03 DIAGNOSIS — I951 Orthostatic hypotension: Secondary | ICD-10-CM | POA: Diagnosis not present

## 2019-05-03 DIAGNOSIS — J449 Chronic obstructive pulmonary disease, unspecified: Secondary | ICD-10-CM | POA: Diagnosis not present

## 2019-05-03 DIAGNOSIS — I110 Hypertensive heart disease with heart failure: Secondary | ICD-10-CM | POA: Diagnosis not present

## 2019-05-03 DIAGNOSIS — I69354 Hemiplegia and hemiparesis following cerebral infarction affecting left non-dominant side: Secondary | ICD-10-CM | POA: Diagnosis not present

## 2019-05-05 DIAGNOSIS — I110 Hypertensive heart disease with heart failure: Secondary | ICD-10-CM | POA: Diagnosis not present

## 2019-05-05 DIAGNOSIS — I69354 Hemiplegia and hemiparesis following cerebral infarction affecting left non-dominant side: Secondary | ICD-10-CM | POA: Diagnosis not present

## 2019-05-05 DIAGNOSIS — J449 Chronic obstructive pulmonary disease, unspecified: Secondary | ICD-10-CM | POA: Diagnosis not present

## 2019-05-05 DIAGNOSIS — I5032 Chronic diastolic (congestive) heart failure: Secondary | ICD-10-CM | POA: Diagnosis not present

## 2019-05-05 DIAGNOSIS — E1151 Type 2 diabetes mellitus with diabetic peripheral angiopathy without gangrene: Secondary | ICD-10-CM | POA: Diagnosis not present

## 2019-05-05 DIAGNOSIS — I951 Orthostatic hypotension: Secondary | ICD-10-CM | POA: Diagnosis not present

## 2019-05-09 DIAGNOSIS — I69354 Hemiplegia and hemiparesis following cerebral infarction affecting left non-dominant side: Secondary | ICD-10-CM | POA: Diagnosis not present

## 2019-05-09 DIAGNOSIS — I5032 Chronic diastolic (congestive) heart failure: Secondary | ICD-10-CM | POA: Diagnosis not present

## 2019-05-09 DIAGNOSIS — E1151 Type 2 diabetes mellitus with diabetic peripheral angiopathy without gangrene: Secondary | ICD-10-CM | POA: Diagnosis not present

## 2019-05-09 DIAGNOSIS — I110 Hypertensive heart disease with heart failure: Secondary | ICD-10-CM | POA: Diagnosis not present

## 2019-05-09 DIAGNOSIS — I951 Orthostatic hypotension: Secondary | ICD-10-CM | POA: Diagnosis not present

## 2019-05-09 DIAGNOSIS — J449 Chronic obstructive pulmonary disease, unspecified: Secondary | ICD-10-CM | POA: Diagnosis not present

## 2019-05-10 NOTE — Telephone Encounter (Signed)
I spoke with Shanita at [facility] assisted living. I stated visit on May 15 will be cancel due to office closing. She stated pt is moving to [place] home in [place]. She will the admissions dept know to call us when he is transfer to schedule a hospital follow up. I gave [facility] number.

## 2019-05-11 DIAGNOSIS — N400 Enlarged prostate without lower urinary tract symptoms: Secondary | ICD-10-CM | POA: Diagnosis not present

## 2019-05-11 DIAGNOSIS — K219 Gastro-esophageal reflux disease without esophagitis: Secondary | ICD-10-CM | POA: Diagnosis not present

## 2019-05-11 DIAGNOSIS — Z85118 Personal history of other malignant neoplasm of bronchus and lung: Secondary | ICD-10-CM | POA: Diagnosis not present

## 2019-05-11 DIAGNOSIS — R278 Other lack of coordination: Secondary | ICD-10-CM | POA: Diagnosis not present

## 2019-05-11 DIAGNOSIS — I69354 Hemiplegia and hemiparesis following cerebral infarction affecting left non-dominant side: Secondary | ICD-10-CM | POA: Diagnosis not present

## 2019-05-11 DIAGNOSIS — R262 Difficulty in walking, not elsewhere classified: Secondary | ICD-10-CM | POA: Diagnosis not present

## 2019-05-11 DIAGNOSIS — E785 Hyperlipidemia, unspecified: Secondary | ICD-10-CM | POA: Diagnosis not present

## 2019-05-11 DIAGNOSIS — J449 Chronic obstructive pulmonary disease, unspecified: Secondary | ICD-10-CM | POA: Diagnosis not present

## 2019-05-11 DIAGNOSIS — I2510 Atherosclerotic heart disease of native coronary artery without angina pectoris: Secondary | ICD-10-CM | POA: Diagnosis not present

## 2019-05-11 DIAGNOSIS — R1312 Dysphagia, oropharyngeal phase: Secondary | ICD-10-CM | POA: Diagnosis not present

## 2019-05-11 DIAGNOSIS — I6340 Cerebral infarction due to embolism of unspecified cerebral artery: Secondary | ICD-10-CM | POA: Diagnosis not present

## 2019-05-11 DIAGNOSIS — E119 Type 2 diabetes mellitus without complications: Secondary | ICD-10-CM | POA: Diagnosis not present

## 2019-05-11 DIAGNOSIS — I5032 Chronic diastolic (congestive) heart failure: Secondary | ICD-10-CM | POA: Diagnosis not present

## 2019-05-11 DIAGNOSIS — D649 Anemia, unspecified: Secondary | ICD-10-CM | POA: Diagnosis not present

## 2019-05-11 DIAGNOSIS — I119 Hypertensive heart disease without heart failure: Secondary | ICD-10-CM | POA: Diagnosis not present

## 2019-05-11 DIAGNOSIS — R2681 Unsteadiness on feet: Secondary | ICD-10-CM | POA: Diagnosis not present

## 2019-05-11 DIAGNOSIS — M6281 Muscle weakness (generalized): Secondary | ICD-10-CM | POA: Diagnosis not present

## 2019-05-11 DIAGNOSIS — I10 Essential (primary) hypertension: Secondary | ICD-10-CM | POA: Diagnosis not present

## 2019-05-11 DIAGNOSIS — E1151 Type 2 diabetes mellitus with diabetic peripheral angiopathy without gangrene: Secondary | ICD-10-CM | POA: Diagnosis not present

## 2019-05-11 DIAGNOSIS — L89620 Pressure ulcer of left heel, unstageable: Secondary | ICD-10-CM | POA: Diagnosis not present

## 2019-05-11 NOTE — Telephone Encounter (Signed)
[facility] nursing [facility] called and stated that the patient can do a VV with the unit Architectural technologist. They said an email can be sent to her email of [email or web address]. he is rescheduled for June 23.

## 2019-05-12 ENCOUNTER — Inpatient Hospital Stay: Payer: Medicare Other | Admitting: Adult Health

## 2019-05-15 NOTE — Telephone Encounter (Signed)
EMail will be sent to [email or web address] day before visit for video visit.

## 2019-05-16 DIAGNOSIS — E1151 Type 2 diabetes mellitus with diabetic peripheral angiopathy without gangrene: Secondary | ICD-10-CM | POA: Diagnosis not present

## 2019-05-16 DIAGNOSIS — I69354 Hemiplegia and hemiparesis following cerebral infarction affecting left non-dominant side: Secondary | ICD-10-CM | POA: Diagnosis not present

## 2019-05-16 DIAGNOSIS — R262 Difficulty in walking, not elsewhere classified: Secondary | ICD-10-CM | POA: Diagnosis not present

## 2019-05-16 DIAGNOSIS — I119 Hypertensive heart disease without heart failure: Secondary | ICD-10-CM | POA: Diagnosis not present

## 2019-05-18 ENCOUNTER — Other Ambulatory Visit: Payer: Self-pay | Admitting: *Deleted

## 2019-05-18 NOTE — Patient Outreach (Signed)
Member assessed for potential [facility] Care Management needs as a benefit of his [employer].  Member was discussed during telephonic IDT meeting with [facility] staff and [facility] UM team on yesterday.  Noted Mr. Gornick was previously at ALF. However, facility states disposition plan may be for long term care at facility.  If member returns to ALF or goes LTC, Probation officer will sign off.   Will continue to follow for more definitive disposition plans.    Marthenia Rolling, MSN-Ed, RN,BSN [place] Acute Care Coordinator [phone]

## 2019-05-24 ENCOUNTER — Other Ambulatory Visit: Payer: Self-pay | Admitting: *Deleted

## 2019-05-24 NOTE — Patient Outreach (Signed)
Attended telephonic IDT meeting with [facility] staff and [facility] UM RN. Mr. Rallis is currently at [facility] receiving rehab therapy.  Member has been assessed for potential [facility] Care Management needs as a benefit of his [employer].   Facility staff report that disposition plan will be for long term care at the facility.   Writer to sign off. There are no identifiable [facility] Care Management needs at this time.    Marthenia Rolling, MSN-Ed, RN,BSN [place] Acute Care Coordinator [phone]

## 2019-05-25 ENCOUNTER — Ambulatory Visit (INDEPENDENT_AMBULATORY_CARE_PROVIDER_SITE_OTHER): Payer: Medicare Other | Admitting: *Deleted

## 2019-05-25 DIAGNOSIS — I6340 Cerebral infarction due to embolism of unspecified cerebral artery: Secondary | ICD-10-CM

## 2019-05-25 LAB — CUP PACEART REMOTE DEVICE CHECK
Date Time Interrogation Session: 20200528124128
Implantable Pulse Generator Implant Date: 20200323

## 2019-05-30 DIAGNOSIS — L89620 Pressure ulcer of left heel, unstageable: Secondary | ICD-10-CM | POA: Diagnosis not present

## 2019-06-01 NOTE — Progress Notes (Signed)
Carelink Summary Report / Loop Recorder 

## 2019-06-06 ENCOUNTER — Inpatient Hospital Stay (HOSPITAL_COMMUNITY)
Admission: AD | Admit: 2019-06-06 | Discharge: 2019-06-10 | DRG: 378 | Disposition: A | Discharge status: Skilled Nursing Facility | Payer: Medicare Other | Source: Other Acute Inpatient Hospital | Attending: Internal Medicine | Admitting: Internal Medicine

## 2019-06-06 DIAGNOSIS — Z66 Do not resuscitate: Secondary | ICD-10-CM | POA: Diagnosis present

## 2019-06-06 DIAGNOSIS — J449 Chronic obstructive pulmonary disease, unspecified: Secondary | ICD-10-CM | POA: Diagnosis present

## 2019-06-06 DIAGNOSIS — E876 Hypokalemia: Secondary | ICD-10-CM | POA: Diagnosis not present

## 2019-06-06 DIAGNOSIS — R05 Cough: Secondary | ICD-10-CM | POA: Diagnosis not present

## 2019-06-06 DIAGNOSIS — I959 Hypotension, unspecified: Secondary | ICD-10-CM | POA: Diagnosis not present

## 2019-06-06 DIAGNOSIS — E119 Type 2 diabetes mellitus without complications: Secondary | ICD-10-CM

## 2019-06-06 DIAGNOSIS — R310 Gross hematuria: Secondary | ICD-10-CM | POA: Diagnosis present

## 2019-06-06 DIAGNOSIS — Z888 Allergy status to other drugs, medicaments and biological substances status: Secondary | ICD-10-CM

## 2019-06-06 DIAGNOSIS — M868X7 Other osteomyelitis, ankle and foot: Secondary | ICD-10-CM | POA: Diagnosis not present

## 2019-06-06 DIAGNOSIS — Z20828 Contact with and (suspected) exposure to other viral communicable diseases: Secondary | ICD-10-CM | POA: Diagnosis not present

## 2019-06-06 DIAGNOSIS — Z7982 Long term (current) use of aspirin: Secondary | ICD-10-CM

## 2019-06-06 DIAGNOSIS — N401 Enlarged prostate with lower urinary tract symptoms: Secondary | ICD-10-CM | POA: Diagnosis present

## 2019-06-06 DIAGNOSIS — N132 Hydronephrosis with renal and ureteral calculous obstruction: Secondary | ICD-10-CM | POA: Diagnosis not present

## 2019-06-06 DIAGNOSIS — Z95818 Presence of other cardiac implants and grafts: Secondary | ICD-10-CM

## 2019-06-06 DIAGNOSIS — Z794 Long term (current) use of insulin: Secondary | ICD-10-CM

## 2019-06-06 DIAGNOSIS — Z86711 Personal history of pulmonary embolism: Secondary | ICD-10-CM

## 2019-06-06 DIAGNOSIS — R531 Weakness: Secondary | ICD-10-CM | POA: Diagnosis not present

## 2019-06-06 DIAGNOSIS — R1909 Other intra-abdominal and pelvic swelling, mass and lump: Secondary | ICD-10-CM | POA: Diagnosis not present

## 2019-06-06 DIAGNOSIS — I050 Rheumatic mitral stenosis: Secondary | ICD-10-CM | POA: Diagnosis present

## 2019-06-06 DIAGNOSIS — D509 Iron deficiency anemia, unspecified: Secondary | ICD-10-CM | POA: Diagnosis not present

## 2019-06-06 DIAGNOSIS — N320 Bladder-neck obstruction: Secondary | ICD-10-CM | POA: Diagnosis present

## 2019-06-06 DIAGNOSIS — I2510 Atherosclerotic heart disease of native coronary artery without angina pectoris: Secondary | ICD-10-CM | POA: Diagnosis not present

## 2019-06-06 DIAGNOSIS — F0390 Unspecified dementia without behavioral disturbance: Secondary | ICD-10-CM | POA: Diagnosis present

## 2019-06-06 DIAGNOSIS — L97428 Non-pressure chronic ulcer of left heel and midfoot with other specified severity: Secondary | ICD-10-CM | POA: Diagnosis present

## 2019-06-06 DIAGNOSIS — Z85118 Personal history of other malignant neoplasm of bronchus and lung: Secondary | ICD-10-CM

## 2019-06-06 DIAGNOSIS — R7889 Finding of other specified substances, not normally found in blood: Secondary | ICD-10-CM | POA: Diagnosis not present

## 2019-06-06 DIAGNOSIS — Z8673 Personal history of transient ischemic attack (TIA), and cerebral infarction without residual deficits: Secondary | ICD-10-CM

## 2019-06-06 DIAGNOSIS — Z902 Acquired absence of lung [part of]: Secondary | ICD-10-CM | POA: Diagnosis not present

## 2019-06-06 DIAGNOSIS — N390 Urinary tract infection, site not specified: Secondary | ICD-10-CM | POA: Diagnosis not present

## 2019-06-06 DIAGNOSIS — E11621 Type 2 diabetes mellitus with foot ulcer: Secondary | ICD-10-CM | POA: Diagnosis not present

## 2019-06-06 DIAGNOSIS — Z8719 Personal history of other diseases of the digestive system: Secondary | ICD-10-CM

## 2019-06-06 DIAGNOSIS — Z955 Presence of coronary angioplasty implant and graft: Secondary | ICD-10-CM

## 2019-06-06 DIAGNOSIS — I10 Essential (primary) hypertension: Secondary | ICD-10-CM | POA: Diagnosis not present

## 2019-06-06 DIAGNOSIS — Z79899 Other long term (current) drug therapy: Secondary | ICD-10-CM

## 2019-06-06 DIAGNOSIS — R4182 Altered mental status, unspecified: Secondary | ICD-10-CM | POA: Diagnosis not present

## 2019-06-06 DIAGNOSIS — E785 Hyperlipidemia, unspecified: Secondary | ICD-10-CM | POA: Diagnosis present

## 2019-06-06 DIAGNOSIS — L97409 Non-pressure chronic ulcer of unspecified heel and midfoot with unspecified severity: Secondary | ICD-10-CM

## 2019-06-06 DIAGNOSIS — E1169 Type 2 diabetes mellitus with other specified complication: Secondary | ICD-10-CM | POA: Diagnosis present

## 2019-06-06 DIAGNOSIS — L89620 Pressure ulcer of left heel, unstageable: Secondary | ICD-10-CM | POA: Diagnosis not present

## 2019-06-06 DIAGNOSIS — R338 Other retention of urine: Secondary | ICD-10-CM | POA: Diagnosis not present

## 2019-06-06 DIAGNOSIS — F419 Anxiety disorder, unspecified: Secondary | ICD-10-CM | POA: Diagnosis present

## 2019-06-06 DIAGNOSIS — I110 Hypertensive heart disease with heart failure: Secondary | ICD-10-CM | POA: Diagnosis not present

## 2019-06-06 DIAGNOSIS — J90 Pleural effusion, not elsewhere classified: Secondary | ICD-10-CM

## 2019-06-06 DIAGNOSIS — K922 Gastrointestinal hemorrhage, unspecified: Principal | ICD-10-CM | POA: Diagnosis present

## 2019-06-06 DIAGNOSIS — Z87891 Personal history of nicotine dependence: Secondary | ICD-10-CM

## 2019-06-06 DIAGNOSIS — I5032 Chronic diastolic (congestive) heart failure: Secondary | ICD-10-CM | POA: Diagnosis present

## 2019-06-06 DIAGNOSIS — D649 Anemia, unspecified: Secondary | ICD-10-CM | POA: Diagnosis present

## 2019-06-06 DIAGNOSIS — R319 Hematuria, unspecified: Secondary | ICD-10-CM | POA: Diagnosis not present

## 2019-06-06 HISTORY — DX: Unspecified dementia, unspecified severity, without behavioral disturbance, psychotic disturbance, mood disturbance, and anxiety: F03.90

## 2019-06-06 NOTE — Telephone Encounter (Signed)
I called [facility] facility at [phone] to find out who call about video visit and link. There was no number and contact person in the previous message on who call I had to call two facility to find pt location. The link was email to [email or web address] and came back undelivered. I finally got in touch with Debbie the scheduler at [facility] and she stated email came back undelivered.Debbie verified the email was  [email or web address] and luanne will be doing video visit with pt.

## 2019-06-06 NOTE — Telephone Encounter (Signed)
[facility] nursing home called to inform us that the pt was moved to the other side and will be having the Virtual Visit there and the new link is [email or web address]

## 2019-06-06 NOTE — Telephone Encounter (Signed)
Link was email to [email or web address] the correct one.

## 2019-06-07 ENCOUNTER — Inpatient Hospital Stay (HOSPITAL_COMMUNITY): Payer: Medicare Other

## 2019-06-07 ENCOUNTER — Observation Stay (HOSPITAL_COMMUNITY): Payer: Medicare Other

## 2019-06-07 ENCOUNTER — Other Ambulatory Visit: Payer: Self-pay

## 2019-06-07 ENCOUNTER — Encounter (HOSPITAL_COMMUNITY): Payer: Self-pay | Admitting: Internal Medicine

## 2019-06-07 DIAGNOSIS — R1909 Other intra-abdominal and pelvic swelling, mass and lump: Secondary | ICD-10-CM | POA: Diagnosis present

## 2019-06-07 DIAGNOSIS — R58 Hemorrhage, not elsewhere classified: Secondary | ICD-10-CM | POA: Diagnosis not present

## 2019-06-07 DIAGNOSIS — I050 Rheumatic mitral stenosis: Secondary | ICD-10-CM | POA: Diagnosis present

## 2019-06-07 DIAGNOSIS — R195 Other fecal abnormalities: Secondary | ICD-10-CM | POA: Diagnosis not present

## 2019-06-07 DIAGNOSIS — R310 Gross hematuria: Secondary | ICD-10-CM | POA: Diagnosis present

## 2019-06-07 DIAGNOSIS — L97429 Non-pressure chronic ulcer of left heel and midfoot with unspecified severity: Secondary | ICD-10-CM | POA: Diagnosis not present

## 2019-06-07 DIAGNOSIS — L97428 Non-pressure chronic ulcer of left heel and midfoot with other specified severity: Secondary | ICD-10-CM | POA: Diagnosis present

## 2019-06-07 DIAGNOSIS — D500 Iron deficiency anemia secondary to blood loss (chronic): Secondary | ICD-10-CM | POA: Diagnosis not present

## 2019-06-07 DIAGNOSIS — N1330 Unspecified hydronephrosis: Secondary | ICD-10-CM | POA: Diagnosis not present

## 2019-06-07 DIAGNOSIS — N3289 Other specified disorders of bladder: Secondary | ICD-10-CM | POA: Diagnosis not present

## 2019-06-07 DIAGNOSIS — K922 Gastrointestinal hemorrhage, unspecified: Secondary | ICD-10-CM | POA: Diagnosis present

## 2019-06-07 DIAGNOSIS — D509 Iron deficiency anemia, unspecified: Secondary | ICD-10-CM | POA: Diagnosis present

## 2019-06-07 DIAGNOSIS — N401 Enlarged prostate with lower urinary tract symptoms: Secondary | ICD-10-CM | POA: Diagnosis present

## 2019-06-07 DIAGNOSIS — I959 Hypotension, unspecified: Secondary | ICD-10-CM | POA: Diagnosis not present

## 2019-06-07 DIAGNOSIS — E785 Hyperlipidemia, unspecified: Secondary | ICD-10-CM | POA: Diagnosis present

## 2019-06-07 DIAGNOSIS — Z20828 Contact with and (suspected) exposure to other viral communicable diseases: Secondary | ICD-10-CM | POA: Diagnosis present

## 2019-06-07 DIAGNOSIS — Z902 Acquired absence of lung [part of]: Secondary | ICD-10-CM | POA: Diagnosis not present

## 2019-06-07 DIAGNOSIS — E119 Type 2 diabetes mellitus without complications: Secondary | ICD-10-CM | POA: Diagnosis not present

## 2019-06-07 DIAGNOSIS — Z794 Long term (current) use of insulin: Secondary | ICD-10-CM | POA: Diagnosis not present

## 2019-06-07 DIAGNOSIS — E11621 Type 2 diabetes mellitus with foot ulcer: Secondary | ICD-10-CM | POA: Diagnosis present

## 2019-06-07 DIAGNOSIS — M869 Osteomyelitis, unspecified: Secondary | ICD-10-CM | POA: Diagnosis not present

## 2019-06-07 DIAGNOSIS — R402411 Glasgow coma scale score 13-15, in the field [EMT or ambulance]: Secondary | ICD-10-CM | POA: Diagnosis not present

## 2019-06-07 DIAGNOSIS — F0390 Unspecified dementia without behavioral disturbance: Secondary | ICD-10-CM | POA: Diagnosis present

## 2019-06-07 DIAGNOSIS — E876 Hypokalemia: Secondary | ICD-10-CM | POA: Diagnosis present

## 2019-06-07 DIAGNOSIS — I2510 Atherosclerotic heart disease of native coronary artery without angina pectoris: Secondary | ICD-10-CM | POA: Diagnosis not present

## 2019-06-07 DIAGNOSIS — N320 Bladder-neck obstruction: Secondary | ICD-10-CM | POA: Diagnosis present

## 2019-06-07 DIAGNOSIS — J449 Chronic obstructive pulmonary disease, unspecified: Secondary | ICD-10-CM | POA: Diagnosis present

## 2019-06-07 DIAGNOSIS — D649 Anemia, unspecified: Secondary | ICD-10-CM | POA: Diagnosis not present

## 2019-06-07 DIAGNOSIS — J440 Chronic obstructive pulmonary disease with acute lower respiratory infection: Secondary | ICD-10-CM | POA: Diagnosis not present

## 2019-06-07 DIAGNOSIS — M868X7 Other osteomyelitis, ankle and foot: Secondary | ICD-10-CM | POA: Diagnosis present

## 2019-06-07 DIAGNOSIS — N132 Hydronephrosis with renal and ureteral calculous obstruction: Secondary | ICD-10-CM | POA: Diagnosis not present

## 2019-06-07 DIAGNOSIS — I110 Hypertensive heart disease with heart failure: Secondary | ICD-10-CM | POA: Diagnosis present

## 2019-06-07 DIAGNOSIS — L89620 Pressure ulcer of left heel, unstageable: Secondary | ICD-10-CM | POA: Diagnosis not present

## 2019-06-07 DIAGNOSIS — R338 Other retention of urine: Secondary | ICD-10-CM | POA: Diagnosis not present

## 2019-06-07 DIAGNOSIS — Z7401 Bed confinement status: Secondary | ICD-10-CM | POA: Diagnosis not present

## 2019-06-07 DIAGNOSIS — F419 Anxiety disorder, unspecified: Secondary | ICD-10-CM | POA: Diagnosis present

## 2019-06-07 DIAGNOSIS — R0989 Other specified symptoms and signs involving the circulatory and respiratory systems: Secondary | ICD-10-CM | POA: Diagnosis not present

## 2019-06-07 DIAGNOSIS — I5032 Chronic diastolic (congestive) heart failure: Secondary | ICD-10-CM | POA: Diagnosis present

## 2019-06-07 DIAGNOSIS — E11622 Type 2 diabetes mellitus with other skin ulcer: Secondary | ICD-10-CM | POA: Diagnosis not present

## 2019-06-07 DIAGNOSIS — E1169 Type 2 diabetes mellitus with other specified complication: Secondary | ICD-10-CM | POA: Diagnosis present

## 2019-06-07 DIAGNOSIS — Z66 Do not resuscitate: Secondary | ICD-10-CM | POA: Diagnosis present

## 2019-06-07 DIAGNOSIS — L97419 Non-pressure chronic ulcer of right heel and midfoot with unspecified severity: Secondary | ICD-10-CM | POA: Diagnosis not present

## 2019-06-07 DIAGNOSIS — M2550 Pain in unspecified joint: Secondary | ICD-10-CM | POA: Diagnosis not present

## 2019-06-07 LAB — PROTIME-INR
INR: 1.3 — ABNORMAL HIGH (ref 0.8–1.2)
Prothrombin Time: 15.8 seconds — ABNORMAL HIGH (ref 11.4–15.2)

## 2019-06-07 LAB — CBC
HCT: 23 % — ABNORMAL LOW (ref 39.0–52.0)
Hemoglobin: 7.7 g/dL — ABNORMAL LOW (ref 13.0–17.0)
MCH: 33 pg (ref 26.0–34.0)
MCHC: 33.5 g/dL (ref 30.0–36.0)
MCV: 98.7 fL (ref 80.0–100.0)
Platelets: 232 10*3/uL (ref 150–400)
RBC: 2.33 MIL/uL — ABNORMAL LOW (ref 4.22–5.81)
RDW: 15.9 % — ABNORMAL HIGH (ref 11.5–15.5)
WBC: 4.2 10*3/uL (ref 4.0–10.5)
nRBC: 0 % (ref 0.0–0.2)

## 2019-06-07 LAB — COMPREHENSIVE METABOLIC PANEL
ALT: 12 U/L (ref 0–44)
AST: 23 U/L (ref 15–41)
Albumin: 2.6 g/dL — ABNORMAL LOW (ref 3.5–5.0)
Alkaline Phosphatase: 68 U/L (ref 38–126)
Anion gap: 11 (ref 5–15)
BUN: 25 mg/dL — ABNORMAL HIGH (ref 8–23)
CO2: 28 mmol/L (ref 22–32)
Calcium: 8.4 mg/dL — ABNORMAL LOW (ref 8.9–10.3)
Chloride: 95 mmol/L — ABNORMAL LOW (ref 98–111)
Creatinine, Ser: 1.08 mg/dL (ref 0.61–1.24)
GFR calc Af Amer: >60 mL/min (ref >60)
GFR calc non Af Amer: >60 mL/min (ref >60)
Glucose, Bld: 188 mg/dL — ABNORMAL HIGH (ref 70–99)
Potassium: 3.2 mmol/L — ABNORMAL LOW (ref 3.5–5.1)
Sodium: 134 mmol/L — ABNORMAL LOW (ref 135–145)
Total Bilirubin: 1.9 mg/dL — ABNORMAL HIGH (ref 0.3–1.2)
Total Protein: 6.1 g/dL — ABNORMAL LOW (ref 6.5–8.1)

## 2019-06-07 LAB — HEMOGLOBIN AND HEMATOCRIT, BLOOD
HCT: 25.1 % — ABNORMAL LOW (ref 39.0–52.0)
Hemoglobin: 8.4 g/dL — ABNORMAL LOW (ref 13.0–17.0)

## 2019-06-07 LAB — GLUCOSE, CAPILLARY
Glucose-Capillary: 161 mg/dL — ABNORMAL HIGH (ref 70–99)
Glucose-Capillary: 200 mg/dL — ABNORMAL HIGH (ref 70–99)

## 2019-06-07 LAB — ABO/RH: ABO/RH(D): AB POS

## 2019-06-07 LAB — PREPARE RBC (CROSSMATCH)

## 2019-06-07 LAB — SARS CORONAVIRUS 2 BY RT PCR (HOSPITAL ORDER, PERFORMED IN ~~LOC~~ HOSPITAL LAB): SARS Coronavirus 2: NEGATIVE

## 2019-06-07 MED ORDER — POTASSIUM CHLORIDE CRYS ER 20 MEQ PO TBCR
40.0000 meq | EXTENDED_RELEASE_TABLET | Freq: Once | ORAL | Status: AC
  Administered 2019-06-07: 40 meq via ORAL
  Filled 2019-06-07: qty 2

## 2019-06-07 MED ORDER — AMOXICILLIN-POT CLAVULANATE 875-125 MG PO TABS
1.0000 | ORAL_TABLET | Freq: Two times a day (BID) | ORAL | Status: DC
  Administered 2019-06-07 – 2019-06-10 (×7): 1 via ORAL
  Filled 2019-06-07 (×7): qty 1

## 2019-06-07 MED ORDER — SODIUM CHLORIDE 0.9 % IV SOLN
INTRAVENOUS | Status: DC
  Administered 2019-06-07 (×2): via INTRAVENOUS

## 2019-06-07 MED ORDER — SODIUM CHLORIDE 0.9% FLUSH
3.0000 mL | Freq: Two times a day (BID) | INTRAVENOUS | Status: DC
  Administered 2019-06-07 – 2019-06-10 (×6): 3 mL via INTRAVENOUS

## 2019-06-07 MED ORDER — INSULIN ASPART 100 UNIT/ML ~~LOC~~ SOLN
0.0000 [IU] | Freq: Every day | SUBCUTANEOUS | Status: DC
  Administered 2019-06-08: 2 [IU] via SUBCUTANEOUS
  Administered 2019-06-09: 3 [IU] via SUBCUTANEOUS

## 2019-06-07 MED ORDER — FUROSEMIDE 10 MG/ML IJ SOLN: 20.0000 mg | Freq: Once | INTRAMUSCULAR | Status: DC

## 2019-06-07 MED ORDER — ENSURE ENLIVE PO LIQD
237.0000 mL | Freq: Two times a day (BID) | ORAL | Status: DC
  Administered 2019-06-08 – 2019-06-10 (×5): 237 mL via ORAL

## 2019-06-07 MED ORDER — FUROSEMIDE 40 MG PO TABS: 40.0000 mg | ORAL_TABLET | Freq: Two times a day (BID) | ORAL | Status: DC

## 2019-06-07 MED ORDER — SENNOSIDES-DOCUSATE SODIUM 8.6-50 MG PO TABS
1.0000 | ORAL_TABLET | Freq: Two times a day (BID) | ORAL | Status: DC
  Administered 2019-06-07 – 2019-06-10 (×7): 1 via ORAL
  Filled 2019-06-07 (×7): qty 1

## 2019-06-07 MED ORDER — ESCITALOPRAM OXALATE 10 MG PO TABS
5.0000 mg | ORAL_TABLET | Freq: Every day | ORAL | Status: DC
  Administered 2019-06-07 – 2019-06-10 (×4): 5 mg via ORAL
  Filled 2019-06-07 (×4): qty 1

## 2019-06-07 MED ORDER — FINASTERIDE 5 MG PO TABS
5.0000 mg | ORAL_TABLET | Freq: Every day | ORAL | Status: DC
  Administered 2019-06-07 – 2019-06-10 (×4): 5 mg via ORAL
  Filled 2019-06-07 (×4): qty 1

## 2019-06-07 MED ORDER — ACETAMINOPHEN 325 MG PO TABS
650.0000 mg | ORAL_TABLET | Freq: Four times a day (QID) | ORAL | Status: DC | PRN
  Administered 2019-06-10: 05:00:00 650 mg via ORAL
  Filled 2019-06-07 (×2): qty 2

## 2019-06-07 MED ORDER — ACETAMINOPHEN 650 MG RE SUPP: 650.0000 mg | Freq: Four times a day (QID) | RECTAL | Status: DC | PRN

## 2019-06-07 MED ORDER — INSULIN ASPART 100 UNIT/ML ~~LOC~~ SOLN
0.0000 [IU] | Freq: Three times a day (TID) | SUBCUTANEOUS | Status: DC
  Administered 2019-06-07: 2 [IU] via SUBCUTANEOUS
  Administered 2019-06-08: 3 [IU] via SUBCUTANEOUS
  Administered 2019-06-08 (×2): 2 [IU] via SUBCUTANEOUS
  Administered 2019-06-09: 3 [IU] via SUBCUTANEOUS
  Administered 2019-06-09: 2 [IU] via SUBCUTANEOUS
  Administered 2019-06-09: 12:00:00 3 [IU] via SUBCUTANEOUS
  Administered 2019-06-10: 2 [IU] via SUBCUTANEOUS
  Administered 2019-06-10: 3 [IU] via SUBCUTANEOUS

## 2019-06-07 MED ORDER — LOSARTAN POTASSIUM 25 MG PO TABS
25.0000 mg | ORAL_TABLET | Freq: Every day | ORAL | Status: DC
  Filled 2019-06-07: qty 1

## 2019-06-07 MED ORDER — SODIUM CHLORIDE 0.9 % IV SOLN
INTRAVENOUS | Status: AC
  Administered 2019-06-07: 04:00:00 via INTRAVENOUS

## 2019-06-07 MED ORDER — SODIUM CHLORIDE 0.9% FLUSH: 3.0000 mL | INTRAVENOUS | Status: DC | PRN

## 2019-06-07 MED ORDER — SODIUM CHLORIDE 0.9 % IV SOLN: 250.0000 mL | INTRAVENOUS | Status: DC | PRN

## 2019-06-07 MED ORDER — DAKINS (1/4 STRENGTH) 0.125 % EX SOLN
Freq: Two times a day (BID) | CUTANEOUS | Status: DC
  Administered 2019-06-07 – 2019-06-10 (×6)
  Filled 2019-06-07: qty 473

## 2019-06-07 MED ORDER — IOHEXOL 300 MG/ML  SOLN: 30.0000 mL | Freq: Once | INTRAMUSCULAR | Status: DC | PRN

## 2019-06-07 MED ORDER — ALFUZOSIN HCL ER 10 MG PO TB24
10.0000 mg | ORAL_TABLET | Freq: Every day | ORAL | Status: DC
  Administered 2019-06-07 – 2019-06-10 (×4): 10 mg via ORAL
  Filled 2019-06-07 (×4): qty 1

## 2019-06-07 MED ORDER — SODIUM CHLORIDE 0.9% IV SOLUTION
Freq: Once | INTRAVENOUS | Status: AC
  Administered 2019-06-07: 13:00:00 via INTRAVENOUS

## 2019-06-07 MED ORDER — EZETIMIBE 10 MG PO TABS
10.0000 mg | ORAL_TABLET | Freq: Every day | ORAL | Status: DC
  Administered 2019-06-07 – 2019-06-10 (×4): 10 mg via ORAL
  Filled 2019-06-07 (×4): qty 1

## 2019-06-07 NOTE — Consult Note (Signed)
[facility] Nurse wound consult note Reason for Consult: left heel wound Wound type: unstageable pressure injury Pressure Injury POA: Yes Measurement: 6cm x 7cm x 0.1cm  Wound bed: 70% black/20% yellow/10% pink  Drainage (amount, consistency, odor) brown/yellow, one area of bright blue green, odor Periwound: macerated, tender, palpable pulses DP/PT Dressing procedure/placement/frequency: Add Dakin's solution for non selective debridement with the level of soft necrotic tissue this wound is in need of debridement.  Last xray negative of abnormalities, would suggest new work up for osteomyelitis  Prevalon boot in place on the left for offloading. Edema noted in the right foot, rotates lateral with some pooling of the dorsal foot. Will add Prevalon boot on the right to lessen lateral rotation.   Based on results of any further xrays or MRI, may need orthopedic consultation.    Discussed POC with patient and bedside nurse.  Re consult if needed, will not follow at this time. Thanks  Nakeisha Greenhouse [employer], RN,CWOCN, CNS, [place] [phone])

## 2019-06-07 NOTE — Progress Notes (Signed)
RN called pt's daughter, [name], and updated on patient status. Rn will have Physician call [name] to give an update.

## 2019-06-07 NOTE — Progress Notes (Signed)
PROGRESS NOTE  MATIX HENSHAW JSH:[id number] DOB: 1926/01/31 DOA: 06/06/2019 PCP: Lajean Manes, MD  HPI/Recap of past 24 hours: HPI from Dr Harriett Sine  is a 85 y.o. male, w hypertension, hyperlipidemia, dm2, CAD, Chronic diastolic CHF (per SNF FL2), CVA, Dementia , h/o PE, lung cancer, BPH, presents due to being sent from SNF with lab abnormality of anemia with hgb of 5.9 (05/12/2019  hgb 9.0). Pt sent to [facility] ER for evaluation. In ED, T97.8, P 88, R 20,  Bp 113/58  Pox 96% RA..Wbc 4.2 , Hgb 5.9, Plt 253. Iron 20, TIBC 187  Iron sat 10.6 % FOBT +. Pt received 1 unit prbc in ER. Pt admitted for Iron deficiency anemia, possible GI bleeding.    Today, patient denied any new complaints (hx of dementia). Noted to have a very distended bladder, Foley catheter placed with initially dark tea colored urine now with gross hematuria . Urology consulted.  GI on board.  Assessment/Plan: Principal Problem:   GI bleeding Active Problems:   Diabetes (HCC)   CAD (coronary artery disease)   COPD (chronic obstructive pulmonary disease) (HCC)   Anemia   Iron deficiency anemia  Severe anemia, iron deficiency/FOBT positive/?GI bleed Hemoglobin 5.9 on presentation, FOBT positive Status post 1U PRBC in the ER, will transfuse another unit GI on board: No further work-up as there are no overt signs of active GI bleeding.  Discussed with son and elected to transfuse blood as needed and follow patient clinically Frequent CBC checks Continue IV fluids Monitor closely  Acute urinary retention/bladder outlet obstruction CT abdomen showed marked distention of the urinary bladder measuring up to 24.8 cm, with dilated ureters with bilateral moderate hydronephrosis, nonobstructing 7 mm stone in the midportion of the left kidney Placed in Foley catheter-drained about 3200 mils of dark, tea colored urine now bright red in bag Urology consulted Continue IV fluids Monitor closely  Left heel ulcer X-ray  foot pending Wound care consulted Continue chronic Augmentin  Hypokalemia Replace PRN  Diabetes mellitus type 2 Sliding scale, hypoglycemic protocol Hold home regimen for now  BPH Continue Proscar, uroxatral  Hypertension Hold losartan due to borderline low BP  CAD/chronic diastolic HF Stop ASA, hold p.o. Lasix for now due to low BP in the setting of possible GI bleed and hematuria  Anxiety/dementia Continue Lexapro         Malnutrition Type:      Malnutrition Characteristics:      Nutrition Interventions:       Estimated body mass index is 27.42 kg/m as calculated from the following:   Height as of 03/18/19: 6\' 3"  (1.905 m).   Weight as of this encounter: 99.5 kg.     Code Status: DNR  Family Communication: None at bedside  Disposition Plan: Back to SNF once work-up completed and patient stable   Consultants:  GI  Urology  Procedures:  None  Antimicrobials:  None  DVT prophylaxis: None due to possible GI bleed/hematuria   Objective: Vitals:   06/07/19 0505 06/07/19 1035 06/07/19 1222 06/07/19 1255  BP: 108/71 122/76 103/65 (!) 99/58  Pulse: 80 79 87 82  Resp: 18  18 18   Temp: 98.8 F (37.1 C)  97.6 F (36.4 C) (!) 97.5 F (36.4 C)  TempSrc: Oral  Oral Oral  SpO2: 100%  98% 97%  Weight: 99.5 kg       Intake/Output Summary (Last 24 hours) at 06/07/2019 1434 Last data filed at 06/07/2019 1204 Gross per 24 hour  Intake 111.38 ml  Output 3200 ml  Net -3088.62 ml   Filed Weights   06/06/19 2130 06/07/19 0505  Weight: 99.4 kg 99.5 kg    Exam:  General: NAD   Cardiovascular: S1, S2 present  Respiratory: CTAB  Abdomen: Soft, nontender, distended bladder noted, bowel sounds present, scrotal swelling  Musculoskeletal: +bilateral pedal edema noted  Skin: Normal  Psychiatry: Normal mood    Data Reviewed: CBC: Recent Labs  Lab 06/07/19 0142  WBC 4.2  HGB 7.7*  HCT 23.0*  MCV 98.7  PLT 277   Basic  Metabolic Panel: Recent Labs  Lab 06/07/19 0142  NA 134*  K 3.2*  CL 95*  CO2 28  GLUCOSE 188*  BUN 25*  CREATININE 1.08  CALCIUM 8.4*   GFR: Estimated Creatinine Clearance: 54.3 mL/min (by C-G formula based on SCr of 1.08 mg/dL). Liver Function Tests: Recent Labs  Lab 06/07/19 0142  AST 23  ALT 12  ALKPHOS 68  BILITOT 1.9*  PROT 6.1*  ALBUMIN 2.6*   No results for input(s): LIPASE, AMYLASE in the last 168 hours. No results for input(s): AMMONIA in the last 168 hours. Coagulation Profile: Recent Labs  Lab 06/07/19 0753  INR 1.3*   Cardiac Enzymes: No results for input(s): CKTOTAL, CKMB, CKMBINDEX, TROPONINI in the last 168 hours. BNP (last 3 results) No results for input(s): PROBNP in the last 8760 hours. HbA1C: No results for input(s): HGBA1C in the last 72 hours. CBG: No results for input(s): GLUCAP in the last 168 hours. Lipid Profile: No results for input(s): CHOL, HDL, LDLCALC, TRIG, CHOLHDL, LDLDIRECT in the last 72 hours. Thyroid Function Tests: No results for input(s): TSH, T4TOTAL, FREET4, T3FREE, THYROIDAB in the last 72 hours. Anemia Panel: No results for input(s): VITAMINB12, FOLATE, FERRITIN, TIBC, IRON, RETICCTPCT in the last 72 hours. Urine analysis:    Component Value Date/Time   COLORURINE YELLOW 03/17/2019 2026   APPEARANCEUR CLEAR 03/17/2019 2026   LABSPEC 1.015 03/17/2019 2026   PHURINE 5.0 03/17/2019 2026   GLUCOSEU 150 (A) 03/17/2019 2026   HGBUR SMALL (A) 03/17/2019 2026   BILIRUBINUR NEGATIVE 03/17/2019 2026   KETONESUR NEGATIVE 03/17/2019 2026   PROTEINUR NEGATIVE 03/17/2019 2026   UROBILINOGEN 1.0 11/24/2014 1506   NITRITE NEGATIVE 03/17/2019 2026   LEUKOCYTESUR NEGATIVE 03/17/2019 2026   Sepsis Labs: @LABRCNTIP (procalcitonin:4,lacticidven:4)  ) Recent Results (from the past 240 hour(s))  SARS Coronavirus 2 (CEPHEID - Performed in [place] hospital lab), Hosp Order     Status: None   Collection Time: 06/07/19  1:36 AM    Result Value Ref Range Status   SARS Coronavirus 2 NEGATIVE NEGATIVE Final    Comment: (NOTE) If result is NEGATIVE SARS-CoV-2 target nucleic acids are NOT DETECTED. The SARS-CoV-2 RNA is generally detectable in upper and lower  respiratory specimens during the acute phase of infection. The lowest  concentration of SARS-CoV-2 viral copies this assay can detect is 250  copies / mL. A negative result does not preclude SARS-CoV-2 infection  and should not be used as the sole basis for treatment or other  patient management decisions.  A negative result may occur with  improper specimen collection / handling, submission of specimen other  than nasopharyngeal swab, presence of viral mutation(s) within the  areas targeted by this assay, and inadequate number of viral copies  (<250 copies / mL). A negative result must be combined with clinical  observations, patient history, and epidemiological information. If result is POSITIVE SARS-CoV-2 target nucleic acids are  DETECTED. The SARS-CoV-2 RNA is generally detectable in upper and lower  respiratory specimens dur ing the acute phase of infection.  Positive  results are indicative of active infection with SARS-CoV-2.  Clinical  correlation with patient history and other diagnostic information is  necessary to determine patient infection status.  Positive results do  not rule out bacterial infection or co-infection with other viruses. If result is PRESUMPTIVE POSTIVE SARS-CoV-2 nucleic acids MAY BE PRESENT.   A presumptive positive result was obtained on the submitted specimen  and confirmed on repeat testing.  While 2019 novel coronavirus  (SARS-CoV-2) nucleic acids may be present in the submitted sample  additional confirmatory testing may be necessary for epidemiological  and / or clinical management purposes  to differentiate between  SARS-CoV-2 and other Sarbecovirus currently known to infect humans.  If clinically indicated additional  testing with an alternate test  methodology [phone]) is advised. The SARS-CoV-2 RNA is generally  detectable in upper and lower respiratory sp ecimens during the acute  phase of infection. The expected result is Negative. Fact Sheet for Patients:  StrictlyIdeas.no Fact Sheet for Healthcare Providers: BankingDealers.co.za This test is not yet approved or cleared by the [place] FDA and has been authorized for detection and/or diagnosis of SARS-CoV-2 by FDA under an Emergency Use Authorization (EUA).  This EUA will remain in effect (meaning this test can be used) for the duration of the COVID-19 declaration under Section 564(b)(1) of the Act, 21 U.S.C. section 360bbb-3(b)(1), unless the authorization is terminated or revoked sooner. Performed at [facility], [place] [address], [place], [place] [address]       Studies: Ct Abdomen Pelvis Wo Contrast  Result Date: 06/07/2019 CLINICAL DATA:  Anemia.  Non pulsatile abdominal mass. EXAM: CT ABDOMEN AND PELVIS WITHOUT CONTRAST TECHNIQUE: Multidetector CT imaging of the abdomen and pelvis was performed following the standard protocol without IV contrast. COMPARISON:  Abdominal ultrasound 06/15/2011 FINDINGS: Lower chest: Moderate bilateral pleural effusions are present. Associated airspace disease likely reflects atelectasis. The heart size is normal. Extensive dense coronary artery calcifications are present. Aortic valve and mitral annular calcifications are present. Hepatobiliary: Liver is unremarkable. Small stones are present at the gallbladder neck. The gallbladder is mildly distended without inflammatory change. The common bile duct is within normal limits into the pancreatic head. Pancreas: Unremarkable. No pancreatic ductal dilatation or surrounding inflammatory changes. Spleen: Normal in size without focal abnormality. Adrenals/Urinary Tract: Adrenal glands are normal  bilaterally. Moderate bilateral hydronephrosis is present. A nonobstructing 7 mm stone is present in the midportion of the left kidney. No other stone or mass lesion is present. Ureters are dilated bilaterally into a markedly dilated urinary bladder. Urinary bladder measures 24.8 x 15 x 7 x 16.9 cm. This distends the lower abdomen and is likely the palpable mass. A TURP defect is present in the prostate. Stomach/Bowel: A moderate-sized hiatal hernia is present. The stomach and duodenum are otherwise within normal limits. Small bowel is unremarkable. Terminal ileum is within normal limits. Appendix is visualized and normal. The ascending and transverse colon are within normal limits. Diverticular changes are present within the descending and sigmoid colon. No inflammatory changes are evident. Vascular/Lymphatic: Atherosclerotic changes are noted in the aorta without aneurysm. IVC filter is in place. Calcifications extend into the arterial branches. No significant adenopathy is present. Reproductive: TURP defect is noted in the prostate. Prostate gland is grossly normal in size. No obstructing mass is evident. Other: No abdominal wall hernia or abnormality. No  abdominopelvic ascites. Bilateral soft tissue edema is present laterally in the lower abdomen and pelvis. There is some edema in the flanks bilaterally. Musculoskeletal: A remote inferior L1 compression fracture is noted. There is no retropulsed bone. Mild rightward curvature is present at L1-2 marked endplate changes are present at L3-4, L4-5 and L5-S1. There is osseous foraminal narrowing bilaterally at these levels. No focal lytic or blastic lesions are present. Degenerative changes are noted in the SI joints. Bony pelvis is otherwise normal. Hips are located and within normal limits. IMPRESSION: 1. Marked distention of the urinary bladder. This represents the palpable mass in the lower abdomen. The bladder measures up to 24.8 cm in long axis. 2. TURP defect  in the prostate gland. There is presumably a more distal urinary outlet obstruction. No discrete mass is visualized. 3. Dilated ureters with bilateral moderate hydronephrosis. 4. Nonobstructing 7 mm stone in the midportion of the left kidney. 5.  Aortic Atherosclerosis (ICD10-I70.0).  No aneurysm is present 6. Extensive coronary artery disease with dense calcifications. 7. Dense calcifications associated with the aortic and mitral valves. 8. Bilateral pleural effusions and associated atelectasis. 9. Multilevel degenerative changes in the lower lumbar spine. Electronically Signed   By: San Morelle M.D.   On: 06/07/2019 09:33    Scheduled Meds:  alfuzosin  10 mg Oral Q breakfast   amoxicillin-clavulanate  1 tablet Oral BID   escitalopram  5 mg Oral Daily   ezetimibe  10 mg Oral Daily   finasteride  5 mg Oral Daily   furosemide  20 mg Intravenous Once   senna-docusate  1 tablet Oral BID   sodium chloride flush  3 mL Intravenous Q12H   sodium hypochlorite   Irrigation BID    Continuous Infusions:  sodium chloride       LOS: 1 day     Alma Friendly, MD [facility]  If 7PM-7AM, please contact night-coverage [email or web address] 06/07/2019, 2:34 PM

## 2019-06-07 NOTE — Consult Note (Signed)
Urology Consult   Physician requesting consult: Dr. Horris Latino  Reason for consult: Urinary retention, bilateral hydronephrosis  History of Present Illness: Chad Fuller is a 85 y.o. admitted with anemia (Hgb 5.9) noted incidentally.  Currently no active bleeding is noted.  There has been no hematuria.  He underwent a CT scan of the abdomen and pelvis as part of his evaluation and was incidentally noted to have bilateral hydroureteronephrosis and a very distended bladder.   Cr is 1.08.  He has a history of prior TURP in 1990 by Dr. Tresa Endo.  He has been on chronic treatment for BPH with maximal medical therapy including alfuzosin 10 mg and finasteride 5 mg.  He denies any difficulty emptying his bladder prior to his hospitalization.   Past Medical History:  Diagnosis Date  . CAD (coronary artery disease)   . Cancer ([place])    lung  . Dementia ([place])   . Diabetes mellitus   . History of blood clots   . Prostate enlargement   . Pulmonary embolus [facility]) july 2005  . Stroke [facility])     Past Surgical History:  Procedure Laterality Date  . COLONOSCOPY N/A 07/24/2015   Procedure: COLONOSCOPY;  Surgeon: Laurence Spates, MD;  Location[facility] ENDOSCOPY;  Service: Endoscopy;  Laterality: N/A;  . CORONARY STENT PLACEMENT    . ESOPHAGOGASTRODUODENOSCOPY N/A 07/24/2015   Procedure: ESOPHAGOGASTRODUODENOSCOPY (EGD);  Surgeon: Laurence Spates, MD;  Location: [facility] ENDOSCOPY;  Service: Endoscopy;  Laterality: N/A;  bleeding source not in colon; EGD performed to locate source  . FILTERING PROCEDURE     reports filter placed after knee surgery for blood clots  . I&D EXTREMITY  08/16/2012   Procedure: MINOR IRRIGATION AND DEBRIDEMENT EXTREMITY;  Surgeon: Tennis Must, MD;  Location: [facility];  Service: Orthopedics;  Laterality: Right;  . KNEE SURGERY    . LOOP RECORDER INSERTION N/A 03/20/2019   Procedure: LOOP RECORDER INSERTION;  Surgeon: Evans Lance, MD;  Location: [place] CV LAB;   Service: Cardiovascular;  Laterality: N/A;  . LUNG REMOVAL, PARTIAL      Medications:  Home meds:  No current facility-administered medications on file prior to encounter.    Current Outpatient Medications on File Prior to Encounter  Medication Sig Dispense Refill  . alfuzosin (UROXATRAL) 10 MG 24 hr tablet Take 10 mg by mouth daily with breakfast.     . amoxicillin-clavulanate (AUGMENTIN) 875-125 MG tablet Take 1 tablet by mouth 2 (two) times daily.    Marland Kitchen aspirin 81 MG chewable tablet Chew 81 mg by mouth daily.    . cholecalciferol (VITAMIN D3) 25 MCG (1000 UT) tablet Take 4,000 Units by mouth daily.    . collagenase (SANTYL) ointment Apply 1 application topically daily.    . ergocalciferol (VITAMIN D2) 1.25 MG (50000 UT) capsule Take 50,000 Units by mouth once a week. On mondays    . escitalopram (LEXAPRO) 5 MG tablet Take 5 mg by mouth daily.    Marland Kitchen ezetimibe (ZETIA) 10 MG tablet Take 10 mg by mouth daily.    . finasteride (PROSCAR) 5 MG tablet Take 5 mg by mouth daily.     . furosemide (LASIX) 40 MG tablet Take 0.5 tablets (20 mg total) by mouth daily. (Patient taking differently: Take 40 mg by mouth 2 (two) times daily. ) 30 tablet 6  . insulin glargine (LANTUS) 100 UNIT/ML injection Inject 0.3 mLs (30 Units total) into the skin daily. 10 mL 11  . insulin lispro (HUMALOG  KWIKPEN) 100 UNIT/ML KwikPen Inject 3 Units into the skin 3 (three) times daily.    Marland Kitchen losartan (COZAAR) 25 MG tablet Take 25 mg by mouth daily.    . Multiple Vitamin (MULTIVITAMIN WITH MINERALS) TABS tablet Take 1 tablet by mouth daily.    . Nutritional Supplements (BOOST GLUCOSE CONTROL PO) Take 4 oz by mouth 2 (two) times a day.    . sennosides-docusate sodium (SENOKOT-S) 8.6-50 MG tablet Take 1 tablet by mouth 2 (two) times a day.    . vitamin B-12 (CYANOCOBALAMIN) 1000 MCG tablet Take 2,000 mcg by mouth daily.    Marland Kitchen ACCU-CHEK AVIVA PLUS test strip AS DIRECTED DX E11.65 THREE TIMES A DAY 90 DAYS  3  . B-D ULTRAFINE  III SHORT PEN 31G X 8 MM MISC USE ONCE A DAY WITH TRESIBA FLEXPEN  3     Scheduled Meds: . alfuzosin  10 mg Oral Q breakfast  . amoxicillin-clavulanate  1 tablet Oral BID  . escitalopram  5 mg Oral Daily  . ezetimibe  10 mg Oral Daily  . [START ON 06/08/2019] feeding supplement (ENSURE ENLIVE)  237 mL Oral BID BM  . finasteride  5 mg Oral Daily  . insulin aspart  0-5 Units Subcutaneous QHS  . insulin aspart  0-9 Units Subcutaneous TID WC  . senna-docusate  1 tablet Oral BID  . sodium chloride flush  3 mL Intravenous Q12H  . sodium hypochlorite   Irrigation BID   Continuous Infusions: . sodium chloride    . sodium chloride 75 mL/hr at 06/07/19 1700   PRN Meds:.sodium chloride, acetaminophen **OR** acetaminophen, iohexol, sodium chloride flush  Allergies:  Allergies  Allergen Reactions  . Pantoprazole Diarrhea  . Protonix [Pantoprazole Sodium] Diarrhea    Family History  Problem Relation Age of Onset  . Diabetes Mellitus II Sister     Social History:  reports that he quit smoking about 44 years ago. His smoking use included cigarettes. He has never used smokeless tobacco. He reports that he does not drink alcohol or use drugs.  ROS: A complete review of systems was performed.  All systems are negative except for pertinent findings as noted.  Physical Exam:  Vital signs in last 24 hours: Temp:  [97.5 F (36.4 C)-99.2 F (37.3 C)] 97.7 F (36.5 C) (06/10 1549) Pulse Rate:  [79-92] 92 (06/10 1549) Resp:  [16-18] 16 (06/10 1549) BP: (99-122)/(58-76) 109/65 (06/10 1549) SpO2:  [97 %-100 %] 97 % (06/10 1549) Weight:  [99.4 kg-99.5 kg] 99.5 kg (06/10 0505) Constitutional:  No acute distress Cardiovascular: Regular rate and rhythm, No JVD Respiratory: Normal respiratory effort GI: Abdomen is soft, nontender, nondistended, no abdominal masses Genitourinary: No CVAT. Normal male phallus, testes are descended bilaterally and non-tender and without masses, scrotum is normal in  appearance without lesions or masses, perineum is normal on inspection. Lymphatic: No lymphadenopathy Neurologic: Grossly intact, no focal deficits Psychiatric: Blunted mood and affect  Laboratory Data:  Recent Labs    06/07/19 0142 06/07/19 1833  WBC 4.2  --   HGB 7.7* 8.4*  HCT 23.0* 25.1*  PLT 232  --     Recent Labs    06/07/19 0142  NA 134*  K 3.2*  CL 95*  GLUCOSE 188*  BUN 25*  CALCIUM 8.4*  CREATININE 1.08     Results for orders placed or performed during the hospital encounter of 06/06/19 (from the past 24 hour(s))  SARS Coronavirus 2 (CEPHEID - Performed in [facility] hospital lab),  Hosp Order     Status: None   Collection Time: 06/07/19  1:36 AM  Result Value Ref Range   SARS Coronavirus 2 NEGATIVE NEGATIVE  Comprehensive metabolic panel     Status: Abnormal   Collection Time: 06/07/19  1:42 AM  Result Value Ref Range   Sodium 134 (L) 135 - 145 mmol/L   Potassium 3.2 (L) 3.5 - 5.1 mmol/L   Chloride 95 (L) 98 - 111 mmol/L   CO2 28 22 - 32 mmol/L   Glucose, Bld 188 (H) 70 - 99 mg/dL   BUN 25 (H) 8 - 23 mg/dL   Creatinine, Ser 1.08 0.61 - 1.24 mg/dL   Calcium 8.4 (L) 8.9 - 10.3 mg/dL   Total Protein 6.1 (L) 6.5 - 8.1 g/dL   Albumin 2.6 (L) 3.5 - 5.0 g/dL   AST 23 15 - 41 U/L   ALT 12 0 - 44 U/L   Alkaline Phosphatase 68 38 - 126 U/L   Total Bilirubin 1.9 (H) 0.3 - 1.2 mg/dL   GFR calc non Af Amer >60 >60 mL/min   GFR calc Af Amer >60 >60 mL/min   Anion gap 11 5 - 15  CBC     Status: Abnormal   Collection Time: 06/07/19  1:42 AM  Result Value Ref Range   WBC 4.2 4.0 - 10.5 K/uL   RBC 2.33 (L) 4.22 - 5.81 MIL/uL   Hemoglobin 7.7 (L) 13.0 - 17.0 g/dL   HCT 23.0 (L) 39.0 - 52.0 %   MCV 98.7 80.0 - 100.0 fL   MCH 33.0 26.0 - 34.0 pg   MCHC 33.5 30.0 - 36.0 g/dL   RDW 15.9 (H) 11.5 - 15.5 %   Platelets 232 150 - 400 K/uL   nRBC 0.0 0.0 - 0.2 %  Type and screen [place]     Status: None (Preliminary result)   Collection Time:  06/07/19  1:42 AM  Result Value Ref Range   ABO/RH(D) AB POS    Antibody Screen NEG    Sample Expiration 06/10/2019,2359    Unit Number [id number]    Blood Component Type RED CELLS,LR    Unit division 00    Status of Unit ALLOCATED    Transfusion Status OK TO TRANSFUSE    Crossmatch Result Compatible    Unit Number [id number]    Blood Component Type RED CELLS,LR    Unit division 00    Status of Unit ISSUED    Transfusion Status OK TO TRANSFUSE    Crossmatch Result      Compatible Performed at [facility], [place] [address], [place], [place] [address]   ABO/Rh     Status: None   Collection Time: 06/07/19  1:42 AM  Result Value Ref Range   ABO/RH(D)      AB POS Performed at [facility], [place] [address], [place], [place] [address]   Protime-INR     Status: Abnormal   Collection Time: 06/07/19  7:53 AM  Result Value Ref Range   Prothrombin Time 15.8 (H) 11.4 - 15.2 seconds   INR 1.3 (H) 0.8 - 1.2  Prepare RBC     Status: None   Collection Time: 06/07/19  7:53 AM  Result Value Ref Range   Order Confirmation      ORDER PROCESSED BY BLOOD BANK Performed at [place] [address], [place], [place] [address]   Glucose, capillary     Status: Abnormal  Collection Time: 06/07/19  4:15 PM  Result Value Ref Range   Glucose-Capillary 161 (H) 70 - 99 mg/dL  Hemoglobin and hematocrit, blood     Status: Abnormal   Collection Time: 06/07/19  6:33 PM  Result Value Ref Range   Hemoglobin 8.4 (L) 13.0 - 17.0 g/dL   HCT 25.1 (L) 39.0 - 52.0 %   Recent Results (from the past 240 hour(s))  SARS Coronavirus 2 (CEPHEID - Performed in [place] hospital lab), Hosp Order     Status: None   Collection Time: 06/07/19  1:36 AM  Result Value Ref Range Status   SARS Coronavirus 2 NEGATIVE NEGATIVE Final    Comment: (NOTE) If result is NEGATIVE SARS-CoV-2 target nucleic acids are NOT DETECTED. The SARS-CoV-2 RNA is generally  detectable in upper and lower  respiratory specimens during the acute phase of infection. The lowest  concentration of SARS-CoV-2 viral copies this assay can detect is 250  copies / mL. A negative result does not preclude SARS-CoV-2 infection  and should not be used as the sole basis for treatment or other  patient management decisions.  A negative result may occur with  improper specimen collection / handling, submission of specimen other  than nasopharyngeal swab, presence of viral mutation(s) within the  areas targeted by this assay, and inadequate number of viral copies  (<250 copies / mL). A negative result must be combined with clinical  observations, patient history, and epidemiological information. If result is POSITIVE SARS-CoV-2 target nucleic acids are DETECTED. The SARS-CoV-2 RNA is generally detectable in upper and lower  respiratory specimens dur ing the acute phase of infection.  Positive  results are indicative of active infection with SARS-CoV-2.  Clinical  correlation with patient history and other diagnostic information is  necessary to determine patient infection status.  Positive results do  not rule out bacterial infection or co-infection with other viruses. If result is PRESUMPTIVE POSTIVE SARS-CoV-2 nucleic acids MAY BE PRESENT.   A presumptive positive result was obtained on the submitted specimen  and confirmed on repeat testing.  While 2019 novel coronavirus  (SARS-CoV-2) nucleic acids may be present in the submitted sample  additional confirmatory testing may be necessary for epidemiological  and / or clinical management purposes  to differentiate between  SARS-CoV-2 and other Sarbecovirus currently known to infect humans.  If clinically indicated additional testing with an alternate test  methodology [phone]) is advised. The SARS-CoV-2 RNA is generally  detectable in upper and lower respiratory sp ecimens during the acute  phase of infection. The  expected result is Negative. Fact Sheet for Patients:  StrictlyIdeas.no Fact Sheet for Healthcare Providers: BankingDealers.co.za This test is not yet approved or cleared by the [place] FDA and has been authorized for detection and/or diagnosis of SARS-CoV-2 by FDA under an Emergency Use Authorization (EUA).  This EUA will remain in effect (meaning this test can be used) for the duration of the COVID-19 declaration under Section 564(b)(1) of the Act, 21 U.S.C. section 360bbb-3(b)(1), unless the authorization is terminated or revoked sooner. Performed at [facility], [place] [address] [place], [place] [address]     Renal Function: Recent Labs    06/07/19 0142  CREATININE 1.08   Estimated Creatinine Clearance: 57.3 mL/min (by C-G formula based on SCr of 1.08 mg/dL).  Radiologic Imaging: Ct Abdomen Pelvis Wo Contrast  Result Date: 06/07/2019 CLINICAL DATA:  Anemia.  Non pulsatile abdominal mass. EXAM: CT ABDOMEN AND PELVIS WITHOUT CONTRAST TECHNIQUE: Multidetector  CT imaging of the abdomen and pelvis was performed following the standard protocol without IV contrast. COMPARISON:  Abdominal ultrasound 06/15/2011 FINDINGS: Lower chest: Moderate bilateral pleural effusions are present. Associated airspace disease likely reflects atelectasis. The heart size is normal. Extensive dense coronary artery calcifications are present. Aortic valve and mitral annular calcifications are present. Hepatobiliary: Liver is unremarkable. Small stones are present at the gallbladder neck. The gallbladder is mildly distended without inflammatory change. The common bile duct is within normal limits into the pancreatic head. Pancreas: Unremarkable. No pancreatic ductal dilatation or surrounding inflammatory changes. Spleen: Normal in size without focal abnormality. Adrenals/Urinary Tract: Adrenal glands are normal bilaterally. Moderate bilateral  hydronephrosis is present. A nonobstructing 7 mm stone is present in the midportion of the left kidney. No other stone or mass lesion is present. Ureters are dilated bilaterally into a markedly dilated urinary bladder. Urinary bladder measures 24.8 x 15 x 7 x 16.9 cm. This distends the lower abdomen and is likely the palpable mass. A TURP defect is present in the prostate. Stomach/Bowel: A moderate-sized hiatal hernia is present. The stomach and duodenum are otherwise within normal limits. Small bowel is unremarkable. Terminal ileum is within normal limits. Appendix is visualized and normal. The ascending and transverse colon are within normal limits. Diverticular changes are present within the descending and sigmoid colon. No inflammatory changes are evident. Vascular/Lymphatic: Atherosclerotic changes are noted in the aorta without aneurysm. IVC filter is in place. Calcifications extend into the arterial branches. No significant adenopathy is present. Reproductive: TURP defect is noted in the prostate. Prostate gland is grossly normal in size. No obstructing mass is evident. Other: No abdominal wall hernia or abnormality. No abdominopelvic ascites. Bilateral soft tissue edema is present laterally in the lower abdomen and pelvis. There is some edema in the flanks bilaterally. Musculoskeletal: A remote inferior L1 compression fracture is noted. There is no retropulsed bone. Mild rightward curvature is present at L1-2 marked endplate changes are present at L3-4, L4-5 and L5-S1. There is osseous foraminal narrowing bilaterally at these levels. No focal lytic or blastic lesions are present. Degenerative changes are noted in the SI joints. Bony pelvis is otherwise normal. Hips are located and within normal limits. IMPRESSION: 1. Marked distention of the urinary bladder. This represents the palpable mass in the lower abdomen. The bladder measures up to 24.8 cm in long axis. 2. TURP defect in the prostate gland. There is  presumably a more distal urinary outlet obstruction. No discrete mass is visualized. 3. Dilated ureters with bilateral moderate hydronephrosis. 4. Nonobstructing 7 mm stone in the midportion of the left kidney. 5.  Aortic Atherosclerosis (ICD10-I70.0).  No aneurysm is present 6. Extensive coronary artery disease with dense calcifications. 7. Dense calcifications associated with the aortic and mitral valves. 8. Bilateral pleural effusions and associated atelectasis. 9. Multilevel degenerative changes in the lower lumbar spine. Electronically Signed   By: San Morelle M.D.   On: 06/07/2019 09:33   Dg Chest Port 1 View  Result Date: 06/07/2019 CLINICAL DATA:  Pleural effusion history of lung cancer EXAM: PORTABLE CHEST 1 VIEW COMPARISON:  03/17/2019, 01/05/2014 FINDINGS: Left lower chest electronic device. Suspected bilateral left greater than right pleural effusion. Bibasilar consolidations. Vascular congestion with diffuse interstitial opacity, likely edema. Both hila appears slightly enlarged. Stable nodularity adjacent to the right fissure. Aortic atherosclerosis. Borderline to mild cardiomegaly. No pneumothorax. IMPRESSION: 1. Suspected left greater than right pleural effusions with new bibasilar consolidations. 2. Borderline to mild cardiomegaly with vascular congestion  and diffuse interstitial opacity consistent with edema 3. Bilateral hilar enlargement, suspected to be secondary to engorged central vasculature. Radiographic follow-up is recommended. Stable right mid lung nodule. Electronically Signed   By: Donavan Foil M.D.   On: 06/07/2019 16:24   Dg Foot Complete Left  Result Date: 06/07/2019 CLINICAL DATA:  Bilateral heel ulcers. EXAM: LEFT FOOT - COMPLETE 3+ VIEW COMPARISON:  Radiographs of March 17, 2019. FINDINGS: Probable lytic area is seen involving the posterior calcaneus most likely due to osteomyelitis. No acute fracture or dislocation is noted. Probable overlying soft tissue  ulceration is noted. Degenerative changes are seen involving the metatarsophalangeal joints. IMPRESSION: Probable lytic areas seen involving posterior calcaneus most consistent with osteomyelitis. MRI is recommended for further evaluation. Electronically Signed   By: Marijo Conception M.D.   On: 06/07/2019 15:18   Dg Foot Complete Right  Result Date: 06/07/2019 CLINICAL DATA:  Bilateral heel ulcers. EXAM: RIGHT FOOT COMPLETE - 3+ VIEW COMPARISON:  Radiographs of September 26, 2015. FINDINGS: There is no evidence of fracture or dislocation. There is no evidence of arthropathy or other focal bone abnormality. There is no definite evidence of lytic destruction. Soft tissues are unremarkable. IMPRESSION: No definite lytic destruction is seen to suggest osteomyelitis. No definite bony abnormality is noted. Electronically Signed   By: Marijo Conception M.D.   On: 06/07/2019 15:20    I independently reviewed the above imaging studies.  Impression/Recommendation 1. Urinary retention: Place Foley catheter for decompression of bladder and leave indwelling.  Etiology of retention may be either bladder outlet obstruction or poor detrusor function and will need to be determined as an outpatient later.  His bilateral hydronephrosis is due to his urinary retention and should resolve with bladder decompression.  Continue medical therapy for BPH with alfuzosin and finasteride.  Follow up in 1-2 weeks for a voiding trial after period of bladder rest with indwelling catheter. Will arrange outpatient follow up.  Dutch Gray 06/07/2019, 6:41 PM    Pryor Curia MD  CC: Dr. Horris Latino

## 2019-06-07 NOTE — Consult Note (Signed)
Subjective:   HPI  The patient is a 85 year old male with multiple medical problems including hypertension, hyperlipidemia, diabetes, coronary artery disease, chronic diastolic congestive heart failure, history of CVA, dementia, history of pulmonary embolus, history of lung cancer, and prior GI bleed secondary to diverticular disease of the colon.  We were asked to see him after admission to the hospital because of heme positive stool and anemia.  The stool was checked in the ER and reported as brown in color but heme positive.  He had a CT scan which was reviewed.  It did show a very enlarged bladder.  Foley catheter has been placed.  Review of Systems No complaints of chest pain or shortness of breath  Past Medical History:  Diagnosis Date  . CAD (coronary artery disease)   . Cancer ([place])    lung  . Dementia ([place])   . Diabetes mellitus   . History of blood clots   . Prostate enlargement   . Pulmonary embolus [facility]) july 2005  . Stroke [facility])    Past Surgical History:  Procedure Laterality Date  . COLONOSCOPY N/A 07/24/2015   Procedure: COLONOSCOPY;  Surgeon: Laurence Spates, MD;  Location: [facility] ENDOSCOPY;  Service: Endoscopy;  Laterality: N/A;  . CORONARY STENT PLACEMENT    . ESOPHAGOGASTRODUODENOSCOPY N/A 07/24/2015   Procedure: ESOPHAGOGASTRODUODENOSCOPY (EGD);  Surgeon: Laurence Spates, MD;  Location: [facility] ENDOSCOPY;  Service: Endoscopy;  Laterality: N/A;  bleeding source not in colon; EGD performed to locate source  . FILTERING PROCEDURE     reports filter placed after knee surgery for blood clots  . I&D EXTREMITY  08/16/2012   Procedure: MINOR IRRIGATION AND DEBRIDEMENT EXTREMITY;  Surgeon: Tennis Must, MD;  Location: [place];  Service: Orthopedics;  Laterality: Right;  . KNEE SURGERY    . LOOP RECORDER INSERTION N/A 03/20/2019   Procedure: LOOP RECORDER INSERTION;  Surgeon: Evans Lance, MD;  Location: [place] CV LAB;  Service: Cardiovascular;  Laterality: N/A;   . LUNG REMOVAL, PARTIAL     Social History   Socioeconomic History  . Marital status: Widowed    Spouse name: Not on file  . Number of children: Not on file  . Years of education: Not on file  . Highest education level: Not on file  Occupational History  . Not on file  Social Needs  . Financial resource strain: Not on file  . Food insecurity:    Worry: Not on file    Inability: Not on file  . Transportation needs:    Medical: Not on file    Non-medical: Not on file  Tobacco Use  . Smoking status: Former Smoker    Types: Cigarettes    Last attempt to quit: 07/28/1974    Years since quitting: 44.8  . Smokeless tobacco: Never Used  Substance and Sexual Activity  . Alcohol use: No  . Drug use: No  . Sexual activity: Not on file  Lifestyle  . Physical activity:    Days per week: Not on file    Minutes per session: Not on file  . Stress: Not on file  Relationships  . Social connections:    Talks on phone: Not on file    Gets together: Not on file    Attends religious service: Not on file    Active member of club or organization: Not on file    Attends meetings of clubs or organizations: Not on file    Relationship status: Not on file  .  Intimate partner violence:    Fear of current or ex partner: Not on file    Emotionally abused: Not on file    Physically abused: Not on file    Forced sexual activity: Not on file  Other Topics Concern  . Not on file  Social History Narrative  . Not on file   family history includes Diabetes Mellitus II in his sister.  Current Facility-Administered Medications:  .  0.9 %  sodium chloride infusion (Manually program via Guardrails IV Fluids), , Intravenous, Once, Alma Friendly, MD .  0.9 %  sodium chloride infusion, 250 mL, Intravenous, PRN, Jani Gravel, MD .  0.9 %  sodium chloride infusion, , Intravenous, Continuous, Jani Gravel, MD, Last Rate: 50 mL/hr at 06/07/19 0333 .  acetaminophen (TYLENOL) tablet 650 mg, 650 mg, Oral,  Q6H PRN **OR** acetaminophen (TYLENOL) suppository 650 mg, 650 mg, Rectal, Q6H PRN, Jani Gravel, MD .  alfuzosin (UROXATRAL) 24 hr tablet 10 mg, 10 mg, Oral, Q breakfast, Jani Gravel, MD, 10 mg at 06/07/19 1031 .  amoxicillin-clavulanate (AUGMENTIN) 875-125 MG per tablet 1 tablet, 1 tablet, Oral, BID, Jani Gravel, MD, 1 tablet at 06/07/19 1030 .  escitalopram (LEXAPRO) tablet 5 mg, 5 mg, Oral, Daily, Jani Gravel, MD, 5 mg at 06/07/19 1030 .  ezetimibe (ZETIA) tablet 10 mg, 10 mg, Oral, Daily, Jani Gravel, MD, 10 mg at 06/07/19 1030 .  finasteride (PROSCAR) tablet 5 mg, 5 mg, Oral, Daily, Jani Gravel, MD, 5 mg at 06/07/19 1031 .  furosemide (LASIX) injection 20 mg, 20 mg, Intravenous, Once, Alma Friendly, MD .  iohexol (OMNIPAQUE) 300 MG/ML solution 30 mL, 30 mL, Oral, Once PRN, Alma Friendly, MD .  senna-docusate (Senokot-S) tablet 1 tablet, 1 tablet, Oral, BID, Jani Gravel, MD, 1 tablet at 06/07/19 1030 .  sodium chloride flush (NS) 0.9 % injection 3 mL, 3 mL, Intravenous, Q12H, Jani Gravel, MD, 3 mL at 06/07/19 0325 .  sodium chloride flush (NS) 0.9 % injection 3 mL, 3 mL, Intravenous, PRN, Jani Gravel, MD Allergies  Allergen Reactions  . Pantoprazole Diarrhea  . Protonix [Pantoprazole Sodium] Diarrhea     Objective:     BP 122/76 (BP Location: Left Arm)   Pulse 79   Temp 98.8 F (37.1 C) (Oral)   Resp 18   Wt 99.5 kg   SpO2 100%   BMI 27.42 kg/m   He does not appear in any acute distress  Heart regular rhythm no murmurs  Lungs clear  Abdomen: Bowel sounds present, soft, there is a fullness and mass feeling on the right side of his abdomen and the CT scan suggests that what we are feeling is an enlarged bladder.  Laboratory No components found for: D1    Assessment:     From a GI standpoint this is a 85 year old male-old male with multiple medical problems as mentioned above who has heme positive stool and anemia.  There is no overt sign of active GI bleeding.  The  etiology of his heme positive stool and anemia is unclear.  I spoke today after seeing the patient with his son Laverna Peace and we have elected to transfuse blood as needed and follow him clinically.  His son is not wanting to have any invasive testing given the patient's age and overall condition.  I think this is reasonable given the circumstances.      Plan:     Transfuse blood as needed.  Follow clinically. Lab Results  Component Value  Date   HGB 7.7 (L) 06/07/2019   HGB 11.5 (L) 03/20/2019   HGB 10.9 (L) 03/19/2019   HGB 15.1 02/21/2014   HGB 14.1 02/22/2013   HGB 14.7 01/21/2012   HCT 23.0 (L) 06/07/2019   HCT 33.9 (L) 03/20/2019   HCT 30.7 (L) 03/19/2019   HCT 44.1 02/21/2014   HCT 40.2 02/22/2013   HCT 43.0 01/21/2012   ALKPHOS 68 06/07/2019   ALKPHOS 84 03/19/2019   ALKPHOS 93 03/17/2019   ALKPHOS 93 02/21/2014   ALKPHOS 112 02/22/2013   AST 23 06/07/2019   AST 20 03/19/2019   AST 27 03/17/2019   AST 22 02/21/2014   AST 27 02/22/2013   ALT 12 06/07/2019   ALT 11 03/19/2019   ALT 14 03/17/2019   ALT 17 02/21/2014   ALT 22 02/22/2013

## 2019-06-07 NOTE — Progress Notes (Signed)
FC inserted at 1100 per order. Urology Consult ordered also. 3200 ml dark, tea colored urine returned. Pt denied any pain or discomfort. Urine now bright red in drainage bag. MD Horris Latino paged to be made aware. 1 unit of blood currently infusing.

## 2019-06-07 NOTE — H&P (Addendum)
TRH H&P    Patient Demographics:    Chad Fuller, is a 85 y.o. male  MRN: [id number]  DOB - 01-Apr-1926  Admit Date - 06/06/2019  Referring MD/NP/PA:  Dr. Floy Sabina   Outpatient Primary MD for the patient is Lajean Manes, MD  [facility] [place]  Patient coming from:  [facility]  Chief complaint- anemia   HPI:    Chad Fuller  is a 85 y.o. male, w hypertension, hyperlipidemia, dm2, CAD, Chronic diastolic CHF (per SNF FL2), CVA, Dementia , h/o PE, lung cancer, BPH, aparently present due to being sent from SNF with lab abnormality of anemia.  05/12/2019  hgb 9.0 Pt sent to [facility] ER for evaluation.   In ED,  T97.8, P 88, R 20,  Bp 113/58  Pox 96% RA Wbc 4.2 , Hgb 5.9, Plt 253 Na 132, K 3.0, Bun 25, Creatinine 1.00  Ast 34, Alt 12, Alk phos 67, T. Bili 0.8 Alb 2.7 Glucose 171  Iron 20, TIBC 187  Iron sat 10.6 % mcv 99 rdw 16  ER (Sikorski) states on rectal brown stool but FOBT +  ER said that they will call  GI ? For FOBT + and possible GI bleeding and request hospitalist to admit.   Pt received 1 unit prbc in ER  Pt will be admitted for Iron deficiency anemia, possible GI bleeding.      Review of systems:    In addition to the HPI above,  Unable to obtain clearly due to dementia  No Fever-chills, No Headache, No changes with Vision or hearing, No problems swallowing food or Liquids, No Chest pain, Cough or Shortness of Breath, No Abdominal pain, No Nausea or Vomiting, bowel movements are regular, No Blood in stool or Urine, No dysuria, No new skin rashes or bruises, No new joints pains-aches,  No new weakness, tingling, numbness in any extremity, No recent weight gain or loss, No polyuria, polydypsia or polyphagia, No significant Mental Stressors.  All other systems reviewed and are negative.    Past History of the following :    Past Medical  History:  Diagnosis Date  . CAD (coronary artery disease)   . Cancer ([place])    lung  . Dementia ([place])   . Diabetes mellitus   . History of blood clots   . Prostate enlargement   . Pulmonary embolus [facility]) july 2005  . Stroke [facility])       Past Surgical History:  Procedure Laterality Date  . COLONOSCOPY N/A 07/24/2015   Procedure: COLONOSCOPY;  Surgeon: Laurence Spates, MD;  Location: [facility] ENDOSCOPY;  Service: Endoscopy;  Laterality: N/A;  . CORONARY STENT PLACEMENT    . ESOPHAGOGASTRODUODENOSCOPY N/A 07/24/2015   Procedure: ESOPHAGOGASTRODUODENOSCOPY (EGD);  Surgeon: Laurence Spates, MD;  Location: [facility] ENDOSCOPY;  Service: Endoscopy;  Laterality: N/A;  bleeding source not in colon; EGD performed to locate source  . FILTERING PROCEDURE     reports filter placed after knee surgery for blood clots  . I&D EXTREMITY  08/16/2012   Procedure: MINOR IRRIGATION  AND DEBRIDEMENT EXTREMITY;  Surgeon: Tennis Must, MD;  Location: [place];  Service: Orthopedics;  Laterality: Right;  . KNEE SURGERY    . LOOP RECORDER INSERTION N/A 03/20/2019   Procedure: LOOP RECORDER INSERTION;  Surgeon: Evans Lance, MD;  Location: [place] CV LAB;  Service: Cardiovascular;  Laterality: N/A;  . LUNG REMOVAL, PARTIAL        Social History:      Social History   Tobacco Use  . Smoking status: Former Smoker    Types: Cigarettes    Last attempt to quit: 07/28/1974    Years since quitting: 44.8  . Smokeless tobacco: Never Used  Substance Use Topics  . Alcohol use: No       Family History :     Family History  Problem Relation Age of Onset  . Diabetes Mellitus II Sister        Home Medications:   Prior to Admission medications   Medication Sig Start Date End Date Taking? Authorizing Provider  alfuzosin (UROXATRAL) 10 MG 24 hr tablet Take 10 mg by mouth daily with breakfast.  10/07/18  Yes [provider]  amoxicillin-clavulanate (AUGMENTIN) 875-125 MG tablet Take 1 tablet  by mouth 2 (two) times daily.   Yes [provider]  aspirin 81 MG chewable tablet Chew 81 mg by mouth daily.   Yes [provider]  cholecalciferol (VITAMIN D3) 25 MCG (1000 UT) tablet Take 4,000 Units by mouth daily.   Yes [provider]  collagenase (SANTYL) ointment Apply 1 application topically daily.   Yes [provider]  ergocalciferol (VITAMIN D2) 1.25 MG (50000 UT) capsule Take 50,000 Units by mouth once a week. On mondays   Yes [provider]  escitalopram (LEXAPRO) 5 MG tablet Take 5 mg by mouth daily.   Yes [provider]  ezetimibe (ZETIA) 10 MG tablet Take 10 mg by mouth daily.   Yes [provider]  finasteride (PROSCAR) 5 MG tablet Take 5 mg by mouth daily.  10/05/18  Yes [provider]  furosemide (LASIX) 40 MG tablet Take 0.5 tablets (20 mg total) by mouth daily. Patient taking differently: Take 40 mg by mouth 2 (two) times daily.  03/21/19  Yes Bonnielee Haff, MD  insulin glargine (LANTUS) 100 UNIT/ML injection Inject 0.3 mLs (30 Units total) into the skin daily. 03/21/19  Yes Bonnielee Haff, MD  insulin lispro (HUMALOG KWIKPEN) 100 UNIT/ML KwikPen Inject 3 Units into the skin 3 (three) times daily.   Yes [provider]  losartan (COZAAR) 25 MG tablet Take 25 mg by mouth daily.   Yes [provider]  Multiple Vitamin (MULTIVITAMIN WITH MINERALS) TABS tablet Take 1 tablet by mouth daily.   Yes [provider]  Nutritional Supplements (BOOST GLUCOSE CONTROL PO) Take 4 oz by mouth 2 (two) times a day.   Yes [provider]  sennosides-docusate sodium (SENOKOT-S) 8.6-50 MG tablet Take 1 tablet by mouth 2 (two) times a day.   Yes [provider]  vitamin B-12 (CYANOCOBALAMIN) 1000 MCG tablet Take 2,000 mcg by mouth daily.   Yes [provider]  ACCU-CHEK AVIVA PLUS test strip AS DIRECTED DX E11.65 THREE TIMES A DAY 90 DAYS 08/15/18   [provider]   B-D ULTRAFINE III SHORT PEN 31G X 8 MM MISC USE ONCE A DAY WITH TRESIBA FLEXPEN 07/22/18   [provider]     Allergies:     Allergies  Allergen Reactions  .  Pantoprazole Diarrhea  . Protonix [Pantoprazole Sodium] Diarrhea     Physical Exam:   Vitals  Blood pressure 102/65, pulse 85, temperature 99.2 F (37.3 C), temperature source Oral, resp. rate 18, weight 99.4 kg.  1.  General: axox3  2. Psychiatric: euthymic  3. Neurologic: cn2-12 intact, reflexes 2+ symmetric, diffuse with no clonus  4. HEENMT:  Anicteric, pupils, 1.31m symmetric, direct, consensual, near intact Neck: no jvd  5. Respiratory : CTAB  6. Cardiovascular : rrr s1, s2, 2/6 sem apex  7. Gastrointestinal:  Abd: soft, nt, nd, +bs ? Mid abdominal mass, ventral hernia  8. Skin:  Ext: no c/c/e  No rash  9.Musculoskeletal:  Good ROM,  No adenopathy    Data Review:    CBC No results for input(s): WBC, HGB, HCT, PLT, MCV, MCH, MCHC, RDW, LYMPHSABS, MONOABS, EOSABS, BASOSABS, BANDABS in the last 168 hours.  Invalid input(s): NEUTRABS, BANDSABD ------------------------------------------------------------------------------------------------------------------  No results found for this or any previous visit (from the past 424hour(s)).  Chemistries  No results for input(s): NA, K, CL, CO2, GLUCOSE, BUN, CREATININE, CALCIUM, MG, AST, ALT, ALKPHOS, BILITOT in the last 168 hours.  Invalid input(s): GFRCGP ------------------------------------------------------------------------------------------------------------------  ------------------------------------------------------------------------------------------------------------------ GFR: CrCl cannot be calculated (Patient's most recent lab result is older than the maximum 21 days allowed.). Liver Function Tests: No results for input(s): AST, ALT, ALKPHOS, BILITOT, PROT, ALBUMIN in the last 168 hours. No results for input(s): LIPASE,  AMYLASE in the last 168 hours. No results for input(s): AMMONIA in the last 168 hours. Coagulation Profile: No results for input(s): INR, PROTIME in the last 168 hours. Cardiac Enzymes: No results for input(s): CKTOTAL, CKMB, CKMBINDEX, TROPONINI in the last 168 hours. BNP (last 3 results) No results for input(s): PROBNP in the last 8760 hours. HbA1C: No results for input(s): HGBA1C in the last 72 hours. CBG: No results for input(s): GLUCAP in the last 168 hours. Lipid Profile: No results for input(s): CHOL, HDL, LDLCALC, TRIG, CHOLHDL, LDLDIRECT in the last 72 hours. Thyroid Function Tests: No results for input(s): TSH, T4TOTAL, FREET4, T3FREE, THYROIDAB in the last 72 hours. Anemia Panel: No results for input(s): VITAMINB12, FOLATE, FERRITIN, TIBC, IRON, RETICCTPCT in the last 72 hours.  --------------------------------------------------------------------------------------------------------------- Urine analysis:    Component Value Date/Time   COLORURINE YELLOW 03/17/2019 2026   APPEARANCEUR CLEAR 03/17/2019 2026   LABSPEC 1.015 03/17/2019 2026   PHURINE 5.0 03/17/2019 2026   GLUCOSEU 150 (A) 03/17/2019 2026   HGBUR SMALL (A) 03/17/2019 2026   BILIRUBINUR NEGATIVE 03/17/2019 2026   KETONESUR NEGATIVE 03/17/2019 2026   PROTEINUR NEGATIVE 03/17/2019 2026   UROBILINOGEN 1.0 11/24/2014 1506   NITRITE NEGATIVE 03/17/2019 2026   LEUKOCYTESUR NEGATIVE 03/17/2019 2026      Imaging Results:    No results found. ekg nsr at 80, nl axis,  Early R progression, no st-t changes c/w ischemia   Assessment & Plan:    Principal Problem:   GI bleeding Active Problems:   Diabetes (HCC)   CAD (coronary artery disease)   COPD (chronic obstructive pulmonary disease) (HCC)   Anemia   Iron deficiency anemia  Iron deficiency anemia, FOBT + ? GI bleed s/p transfusion 1 unit prbc NPO Type and screen Check cbc, if hgb not improved will need additional transfusion Please consult GI in  am  ?abdominal mass/ ventral hernia Check CT scan abd/ pelvis  Dm2 Hold Lantus due to uncertainty about his po intake fsbs q4h, ISS  BPH Cont Urolxatral 147mpo qday Cont Proscar 75m28m  po qday  Hypertension Hold Losartan 11m po qday due to borderline low bp  Hyperlipidemia Cont zetia 170mpo qday  CAD / Chronic diastolic CHF Tele STOP aspirin due to anemia and concern for GI bleeding Cont Zetia 1014mo qday Cont Lasix 73m62m bid (please verify dose in AM)  Cardiac murmer Review of prior cardiac echo => mitral stenosis (mild)  Anxiety/ Dementia Cont lexapro 5mg 66mqday  Heel ulcer Wound care consult Cont Augmentin 875mg 54mid    Medication list not sent with [facility] packet ? Or missing?, unclear what patient is taking.  Please address in AM   DVT Prophylaxis-    SCDs  AM Labs Ordered, also please review Full Orders  Family Communication: Admission, patients condition and plan of care including tests being ordered have been discussed with the patient  who indicate understanding and agree with the plan and Code Status.  Code Status:  FULL CODE  Admission status: Observation: Based on patients clinical presentation and evaluation of above clinical data, I have made determination that patient meets observation criteria at this time.  Time spent in minutes : 55 minutes    [place] Paiz Jani Graveln 06/07/2019 at 1:17 AM

## 2019-06-08 ENCOUNTER — Inpatient Hospital Stay (HOSPITAL_COMMUNITY): Payer: Medicare Other

## 2019-06-08 DIAGNOSIS — I2510 Atherosclerotic heart disease of native coronary artery without angina pectoris: Secondary | ICD-10-CM

## 2019-06-08 DIAGNOSIS — K922 Gastrointestinal hemorrhage, unspecified: Principal | ICD-10-CM

## 2019-06-08 DIAGNOSIS — E876 Hypokalemia: Secondary | ICD-10-CM

## 2019-06-08 DIAGNOSIS — D509 Iron deficiency anemia, unspecified: Secondary | ICD-10-CM

## 2019-06-08 LAB — GLUCOSE, CAPILLARY
Glucose-Capillary: 170 mg/dL — ABNORMAL HIGH (ref 70–99)
Glucose-Capillary: 154 mg/dL — ABNORMAL HIGH (ref 70–99)
Glucose-Capillary: 188 mg/dL — ABNORMAL HIGH (ref 70–99)
Glucose-Capillary: 212 mg/dL — ABNORMAL HIGH (ref 70–99)
Glucose-Capillary: 232 mg/dL — ABNORMAL HIGH (ref 70–99)

## 2019-06-08 LAB — BASIC METABOLIC PANEL
Anion gap: 9 (ref 5–15)
BUN: 18 mg/dL (ref 8–23)
CO2: 28 mmol/L (ref 22–32)
Calcium: 7.9 mg/dL — ABNORMAL LOW (ref 8.9–10.3)
Chloride: 98 mmol/L (ref 98–111)
Creatinine, Ser: 0.81 mg/dL (ref 0.61–1.24)
GFR calc Af Amer: >60 mL/min (ref >60)
GFR calc non Af Amer: >60 mL/min (ref >60)
Glucose, Bld: 170 mg/dL — ABNORMAL HIGH (ref 70–99)
Potassium: 3 mmol/L — ABNORMAL LOW (ref 3.5–5.1)
Sodium: 135 mmol/L (ref 135–145)

## 2019-06-08 LAB — CBC WITH DIFFERENTIAL/PLATELET
Abs Immature Granulocytes: 0.03 10*3/uL (ref 0.00–0.07)
Basophils Absolute: 0 10*3/uL (ref 0.0–0.1)
Basophils Relative: 0 %
Eosinophils Absolute: 0 10*3/uL (ref 0.0–0.5)
Eosinophils Relative: 1 %
HCT: 24.9 % — ABNORMAL LOW (ref 39.0–52.0)
Hemoglobin: 8 g/dL — ABNORMAL LOW (ref 13.0–17.0)
Immature Granulocytes: 1 %
Lymphocytes Relative: 29 %
Lymphs Abs: 1 10*3/uL (ref 0.7–4.0)
MCH: 32.4 pg (ref 26.0–34.0)
MCHC: 32.1 g/dL (ref 30.0–36.0)
MCV: 100.8 fL — ABNORMAL HIGH (ref 80.0–100.0)
Monocytes Absolute: 0.2 10*3/uL (ref 0.1–1.0)
Monocytes Relative: 7 %
Neutro Abs: 2.1 10*3/uL (ref 1.7–7.7)
Neutrophils Relative %: 62 %
Platelets: 220 10*3/uL (ref 150–400)
RBC: 2.47 MIL/uL — ABNORMAL LOW (ref 4.22–5.81)
RDW: 16.2 % — ABNORMAL HIGH (ref 11.5–15.5)
WBC: 3.4 10*3/uL — ABNORMAL LOW (ref 4.0–10.5)
nRBC: 0 % (ref 0.0–0.2)

## 2019-06-08 LAB — BRAIN NATRIURETIC PEPTIDE: B Natriuretic Peptide: 699.9 pg/mL — ABNORMAL HIGH (ref 0.0–100.0)

## 2019-06-08 MED ORDER — POTASSIUM CHLORIDE 10 MEQ/100ML IV SOLN
10.0000 meq | INTRAVENOUS | Status: DC
  Administered 2019-06-08 (×2): 10 meq via INTRAVENOUS
  Filled 2019-06-08 (×2): qty 100

## 2019-06-08 MED ORDER — POTASSIUM CHLORIDE 10 MEQ/100ML IV SOLN
10.0000 meq | INTRAVENOUS | Status: AC
  Administered 2019-06-08 (×2): 10 meq via INTRAVENOUS
  Filled 2019-06-08 (×2): qty 100

## 2019-06-08 MED ORDER — RESOURCE THICKENUP CLEAR PO POWD
ORAL | Status: DC | PRN
  Filled 2019-06-08: qty 125

## 2019-06-08 NOTE — Plan of Care (Signed)
  Problem: Acute Rehab PT Goals(only PT should resolve) Goal: Pt Will Go Supine/Side To Sit Outcome: Progressing Flowsheets (Taken 06/08/2019 1733) Pt will go Supine/Side to Sit: with moderate assist Goal: Pt Will Go Sit To Supine/Side Outcome: Progressing Flowsheets (Taken 06/08/2019 1733) Pt will go Sit to Supine/Side: with moderate assist Goal: Patient Will Transfer Sit To/From Stand Outcome: Progressing Flowsheets (Taken 06/08/2019 1733) Patient will transfer sit to/from stand:  with moderate assist  with +2

## 2019-06-08 NOTE — Progress Notes (Signed)
Took over care of patient at 1500. I agree with the previous nurses assessment and documentation. Will continue to monitor patient. Timoteo Gaul, RN

## 2019-06-08 NOTE — Progress Notes (Signed)
Patient seen.  No clinical signs of active GI bleeding.  Patient transfused.  Hemoglobin up.  Per my note yesterday after discussion with his sons we have no plans for endoscopic evaluation.  We will sign off.  Call us if needed.

## 2019-06-08 NOTE — Evaluation (Signed)
Physical Therapy Evaluation Patient Details Name: Chad Fuller MRN: [id number] DOB: 1926/03/21 Today's Date: 06/08/2019   History of Present Illness  Chad Fuller  is a 85 y.o. male, w hypertension, hyperlipidemia, dm2, CAD, Chronic diastolic CHF (per SNF FL2), CVA, Dementia , h/o PE, lung cancer, BPH, aparently present due to being sent from SNF with lab abnormality of anemia. Pt admitted for anemia and possible GI bleed.    Clinical Impression  Chad Fuller is being evaluated by physical therapy due to ongoing weakness and medical instability since admission for above HPI. He was admitted from SNF and baseline of independence is unknown as patient in unreliable historian due to cognitive impairment/history of dementia. Mobility was limited to supine<>sit transfer today due to significant weakness. Patient required mod to max assist for supine to sit and max assist with 2 persons for sit to supine. He was unable to maintain seated balance at EOB without support posteriorly and standing was deferred for safety concerns due to poor trunk control. Acute PT will follow to progress mobility and balance impairments as able. Pt will benefit from continued therapy at below venue to improve mobility.    Follow Up Recommendations SNF    Equipment Recommendations  None recommended by PT    Recommendations for Other Services       Precautions / Restrictions Precautions Precautions: Fall Restrictions Weight Bearing Restrictions: No      Mobility  Bed Mobility Overal bed mobility: Needs Assistance Bed Mobility: Supine to Sit;Sit to Supine     Supine to sit: Mod assist;Max assist Sit to supine: Max assist;+2 for physical assistance   General bed mobility comments: pt requires assistance to raise bil LE's on/off the EOB for supine<>sit transfers and mod to max assist to riase trunk upright and maintain upright posture. 2 person assist required for bed mobility with scooting and repositioning in  bed.  Transfers Overall transfer level: Needs assistance        General transfer comment: deferred today due to weakness, foot/heel wound and safety concerns  Ambulation/Gait      General Gait Details: deferred today due to weakness, foot/heel wound and safety concerns      Balance Overall balance assessment: Needs assistance Sitting-balance support: Bilateral upper extremity supported Sitting balance-Leahy Scale: Poor Sitting balance - Comments: pt unable to maintain sitting balance at EOB and required mod-max assist to maitain upright trunk, he was unable to scoot forwrad safely to place feet on floor     Standing balance-Leahy Scale: Zero Standing balance comment: deferred today due to weakness, foot/heel wound and safety concerns          Pertinent Vitals/Pain Pain Assessment: No/denies pain    Home Living Family/patient expects to be discharged to:: Skilled nursing facility       Prior Function Level of Independence: Needs assistance         Comments: pt unable to give reliable history due to basline of cognitive deficits related to dementia.     Hand Dominance        Extremity/Trunk Assessment   Upper Extremity Assessment Upper Extremity Assessment: Generalized weakness;Defer to OT evaluation    Lower Extremity Assessment Lower Extremity Assessment: Generalized weakness    Cervical / Trunk Assessment Cervical / Trunk Assessment: Kyphotic  Communication   Communication: HOH  Cognition Arousal/Alertness: Awake/alert Behavior During Therapy: WFL for tasks assessed/performed Overall Cognitive Status: History of cognitive impairments - at baseline      General Comments: Pt reports he walked  into the hospital with his RW indicating poor historian. Unable to get accurate PLOF due to this.             Assessment/Plan    PT Assessment Patient needs continued PT services  PT Problem List Decreased strength;Decreased mobility;Decreased  balance;Decreased activity tolerance;Decreased safety awareness;Decreased skin integrity       PT Treatment Interventions DME instruction;Gait training;Therapeutic activities;Functional mobility training;Balance training;Patient/family education;Stair training;Therapeutic exercise;Manual techniques    PT Goals (Current goals can be found in the Care Plan section)  Acute Rehab PT Goals Patient Stated Goal: pt did not state any goals, he will benefit from pressure relief and mobilization to return to SNF PT Goal Formulation: Patient unable to participate in goal setting Time For Goal Achievement: 06/15/19 Potential to Achieve Goals: Fair    Frequency Min 3X/week    AM-PAC PT "6 Clicks" Mobility  Outcome Measure Help needed turning from your back to your side while in a flat bed without using bedrails?: A Lot Help needed moving from lying on your back to sitting on the side of a flat bed without using bedrails?: Total Help needed moving to and from a bed to a chair (including a wheelchair)?: Total Help needed standing up from a chair using your arms (e.g., wheelchair or bedside chair)?: Total Help needed to walk in hospital room?: Total Help needed climbing 3-5 steps with a railing? : Total 6 Click Score: 7    End of Session   Activity Tolerance: Patient tolerated treatment well Patient left: in bed;with call bell/phone within reach;with nursing/sitter in room;Other (comment)(with SLP in room for evaluation) Nurse Communication: Mobility status PT Visit Diagnosis: Other abnormalities of gait and mobility (R26.89);Muscle weakness (generalized) (M62.81);Difficulty in walking, not elsewhere classified (R26.2)    Time: 7673-4193 PT Time Calculation (min) (ACUTE ONLY): 14 min   Charges:   PT Evaluation $PT Eval Moderate Complexity: 1 Mod          Chad Fuller, PT, DPT, [facility] Physical Therapist with [facility]  06/08/2019 5:32 PM

## 2019-06-08 NOTE — Progress Notes (Signed)
RN spoke to family member Sonia Side about patient. She will disseminate information to other family members. Her phone number is [phone].

## 2019-06-08 NOTE — Evaluation (Signed)
Clinical/Bedside Swallow Evaluation Patient Details  Name: Chad Fuller MRN: [id number] Date of Birth: [birth year]  Today's Date: 06/08/2019 Time: SLP Start Time (ACUTE ONLY): 1435 SLP Stop Time (ACUTE ONLY): 1505 SLP Time Calculation (min) (ACUTE ONLY): 30 min  Past Medical History:  Past Medical History:  Diagnosis Date  . CAD (coronary artery disease)   . Cancer ([place])    lung  . Dementia ([place])   . Diabetes mellitus   . History of blood clots   . Prostate enlargement   . Pulmonary embolus [facility]) july 2005  . Stroke [facility])    Past Surgical History:  Past Surgical History:  Procedure Laterality Date  . COLONOSCOPY N/A 07/24/2015   Procedure: COLONOSCOPY;  Surgeon: Laurence Spates, MD;  Location: [facility] ENDOSCOPY;  Service: Endoscopy;  Laterality: N/A;  . CORONARY STENT PLACEMENT    . ESOPHAGOGASTRODUODENOSCOPY N/A 07/24/2015   Procedure: ESOPHAGOGASTRODUODENOSCOPY (EGD);  Surgeon: Laurence Spates, MD;  Location[facility] ENDOSCOPY;  Service: Endoscopy;  Laterality: N/A;  bleeding source not in colon; EGD performed to locate source  . FILTERING PROCEDURE     reports filter placed after knee surgery for blood clots  . I&D EXTREMITY  08/16/2012   Procedure: MINOR IRRIGATION AND DEBRIDEMENT EXTREMITY;  Surgeon: Tennis Must, MD;  Location: [place];  Service: Orthopedics;  Laterality: Right;  . KNEE SURGERY    . LOOP RECORDER INSERTION N/A 03/20/2019   Procedure: LOOP RECORDER INSERTION;  Surgeon: Evans Lance, MD;  Location: [place] CV LAB;  Service: Cardiovascular;  Laterality: N/A;  . LUNG REMOVAL, PARTIAL     HPI:  pt is a 85 yo male adm to [facility] with concern for sepsis due to possible urinary retention s/p TURP.  H/O CVA with dysphagia, CAD, dementia, DM, lung cancer and prior smoker.  Pt CXR showed left more than right pleural effusions with bibasilar ATX, stable right lung nodule.  Prior MBS 03/18/2019 showed silent aspiration, penetration and aspiration of larger sips,  delayed swallow to pyriform sinus.  Pt has a moderate HH per imaging study.   Assessment / Plan / Recommendation Clinical Impression  Patient has h/o known dysphagia from prior CVA - last swallowing test being 02/2019 showing silent aspiration of thin/nectar.  Patient presents with clinical indications concerning for aspiration of thin and nectar liquids.  Overt coughing with pt attempts at 3 ounce Yale water test.  Pt takes large boluses possibly due to poor control with holding cup.  Small single boluses of ice chips, tsps thin water, nectar, puree, cracker tolerated without s/s of aspiration.  Pt is slow to masticate solids - crackers- and needed liquids to moisten bolus to allow transit.  Suspect strong pharyngeal swallow as pt without indication of gross residuals and prior MBS showed strong pharyngeal swallow.  Recommend to modify diet to mitigate aspiration. Of note, pt admits he coughs sometimes with liquids prior to admission.  Will follow up, pt agreeable. SLP Visit Diagnosis: Dysphagia, oropharyngeal phase (R13.12)    Aspiration Risk  Mild aspiration risk;Moderate aspiration risk    Diet Recommendation Dysphagia 3 (Mech soft);Nectar-thick liquid;Other (Comment)(ice chips ok anytime, tsps thin water ok between meals)   Liquid Administration via: Cup;Straw;Spoon Medication Administration: Whole meds with puree Supervision: Full supervision/cueing for compensatory strategies;Staff to assist with self feeding Compensations: Slow rate;Small sips/bites;Other (Comment)(oral suction prior to and after po, remove dentures nightly and clean) Postural Changes: Seated upright at 90 degrees;Remain upright for at least 30 minutes after po intake  Other  Recommendations Other Recommendations: Have oral suction available   Follow up Recommendations        Frequency and Duration min 2x/week  1 week       Prognosis Prognosis for Safe Diet Advancement: Fair Barriers to Reach Goals: Time post  onset      Swallow Study   General Date of Onset: 06/08/19 HPI: pt is a 85 yo male adm to [facility] with concern for sepsis due to possible urinary retention s/p TURP.  H/O CVA with dysphagia, CAD, dementia, DM, lung cancer and prior smoker.  Pt CXR showed left more than right pleural effusions with bibasilar ATX, stable right lung nodule.  Prior MBS 03/18/2019 showed silent aspiration, penetration and aspiration of larger sips, delayed swallow to pyriform sinus.  Pt has a moderate HH per imaging study. Type of Study: Bedside Swallow Evaluation Diet Prior to this Study: Regular;Thin liquids Temperature Spikes Noted: No Respiratory Status: Room air History of Recent Intubation: No Behavior/Cognition: Alert;Cooperative;Pleasant mood Oral Cavity Assessment: Other (comment);Within Functional Limits(viscous secretions) Oral Care Completed by SLP: Yes(oral suction used) Oral Cavity - Dentition: Dentures, top;Dentures, bottom Vision: Impaired for self-feeding Self-Feeding Abilities: Total assist Patient Positioning: Upright in bed Baseline Vocal Quality: Normal Volitional Cough: Weak Volitional Swallow: Unable to elicit    Oral/Motor/Sensory Function Overall Oral Motor/Sensory Function: Generalized oral weakness(? mild lingual deviation to right upon protrusion) Facial ROM: Reduced right Facial Symmetry: Abnormal symmetry right Facial Strength: Reduced right Lingual ROM: Reduced right Lingual Symmetry: Within Functional Limits Lingual Strength: Reduced Velum: Within Functional Limits   Ice Chips Ice chips: Within functional limits Presentation: Spoon   Thin Liquid Thin Liquid: Impaired Presentation: Cup;Self Fed;Straw;Spoon Oral Phase Impairments: Reduced labial seal;Reduced lingual movement/coordination Pharyngeal  Phase Impairments: Multiple swallows;Cough - Immediate Other Comments: pt did not pass 3 ounce water test as he consumed approx 2 ounces followed by excessive coughing, no overt  indication of airway compromise with tsps thin    Nectar Thick Nectar Thick Liquid: Impaired Presentation: Cup;Self Fed;Straw;Spoon Oral Phase Impairments: Reduced labial seal;Reduced lingual movement/coordination Pharyngeal Phase Impairments: Cough - Immediate Other Comments: cough with large boluses, pt has difficulty manually controlling amount of liquid he receives thus is suspectible to aspiration across all liquids   Honey Thick Honey Thick Liquid: Not tested   Puree Puree: Impaired Presentation: Self Fed;Spoon Oral Phase Impairments: Reduced labial seal;Reduced lingual movement/coordination   Solid     Solid: Impaired Presentation: Self Fed Oral Phase Impairments: Reduced lingual movement/coordination;Impaired mastication Oral Phase Functional Implications: Other (comment);Right anterior spillage(cracker retention on right labial region, pt able to clear with verbal cue to location) Other Comments: pt needed liquids to aid clearance      Macario Golds 06/08/2019,4:03 PM  Luanna Salk, [place] Pager [phone] Office [phone]

## 2019-06-08 NOTE — Progress Notes (Signed)
  Speech Language Pathology Treatment: Dysphagia  Patient Details Name: Chad Fuller MRN: [id number] DOB: 11/03/1926 Today's Date: 06/08/2019 Time: 1115-5208 SLP Time Calculation (min) (ACUTE ONLY): 25 min  Assessment / Plan / Recommendation Clinical Impression  SLP educated RN, nurse technician and pt to recommendations for diet modifications and swallowing strategies, Pt in agreement to plan of modifed diet currently to compensate for his chronic dysphagia with likely acute worsening due to his weakness with this medical illness.  Anticipate he may be appropriate to advance to regular/thin diet after discharge but he will be a chronic aspiration risk due to his dysphagia. Pt has not has pna since his "childhood" per pt, and suspect he has tolerated episodic aspiration.    Educated pt to increased aspiration pna risk given his bedbound status, advanced age and level of dysphagia. Using teach back he was educated but he will need full assist for airway protection with po due to his weakness impacting self feeding.  Reviewed diet plan with pt in detail and he appeared to comprehend and was agreeable.    HPI HPI: pt is a 85 yo male adm to [facility] with concern for sepsis due to possible urinary retention s/p TURP.  H/O CVA with dysphagia, CAD, dementia, DM, lung cancer and prior smoker.  Pt CXR showed left more than right pleural effusions with bibasilar ATX, stable right lung nodule.  Prior MBS 03/18/2019 showed silent aspiration, penetration and aspiration of larger sips, delayed swallow to pyriform sinus.  Pt has a moderate HH per imaging study.      SLP Plan  Continue with current plan of care       Recommendations  Diet recommendations: Dysphagia 3 (mechanical soft);Nectar-thick liquid Liquids provided via: Cup;Teaspoon Medication Administration: Whole meds with puree Supervision: Staff to assist with self feeding Compensations: Slow rate;Small sips/bites;Other (Comment)(oral suction prior  to and after po, remove dentures nightly and clean) Postural Changes and/or Swallow Maneuvers: Seated upright 90 degrees;Upright 30-60 min after meal                SLP Visit Diagnosis: Dysphagia, oropharyngeal phase (R13.12) Plan: Continue with current plan of care       GO                Macario Golds 06/08/2019, 4:12 PM   Luanna Salk, [place] [facility] SLP [place] Pager [phone] Office [phone]

## 2019-06-08 NOTE — Progress Notes (Signed)
PROGRESS NOTE  Chad Fuller IRW:[id number] DOB: 19-Feb-1926 DOA: 06/06/2019 PCP: Lajean Manes, MD  HPI/Recap of past 24 hours: HPI from Dr Harriett Sine  is a 85 y.o. male, w hypertension, hyperlipidemia, dm2, CAD, Chronic diastolic CHF (per SNF FL2), CVA, Dementia , h/o PE, lung cancer, BPH, presents due to being sent from SNF with lab abnormality of anemia with hgb of 5.9 (05/12/2019  hgb 9.0). Pt sent to [facility] ER for evaluation. In ED, T97.8, P 88, R 20,  Bp 113/58  Pox 96% RA..Wbc 4.2 , Hgb 5.9, Plt 253. Iron 20, TIBC 187  Iron sat 10.6 % FOBT +. Pt received 1 unit prbc in ER. Pt admitted for Iron deficiency anemia, possible GI bleeding.     Today, patient reports no new complaints.  Assessment/Plan: Principal Problem:   GI bleeding Active Problems:   Diabetes (HCC)   CAD (coronary artery disease)   COPD (chronic obstructive pulmonary disease) (HCC)   Anemia   Iron deficiency anemia  Severe anemia, iron deficiency/FOBT positive/?GI bleed Hemoglobin 5.9 on presentation, FOBT positive Status post 2U PRBC  GI on board: No further work-up as there are no overt signs of active GI bleeding.  Discussed with son and elected to transfuse blood as needed and follow patient clinically Frequent CBC checks Monitor closely  Acute urinary retention/bladder outlet obstruction CT abdomen showed marked distention of the urinary bladder measuring up to 24.8 cm, with dilated ureters with bilateral moderate hydronephrosis, nonobstructing 7 mm stone in the midportion of the left kidney Placed in Foley catheter-drained about 3200 mils of dark, tea colored urine now bright red in bag Urology consulted-follow-up as an outpatient  Left heel ulcer X-ray foot showed possible osteomyelitis MRI pending Continue chronic Augmentin May not be a candidate for surgical intervention, but will involve Ortho once MRI is resulted  Hypokalemia Replace PRN  Diabetes mellitus type 2 Sliding scale,  hypoglycemic protocol Hold home regimen for now  BPH Continue Proscar, uroxatral  Hypertension Hold losartan due to borderline low BP  CAD/chronic diastolic HF Stop ASA, held p.o. Lasix due to low BP in the setting of possible GI bleed May re-evaluate and restart lasix soon  Anxiety/dementia Continue Lexapro         Malnutrition Type:      Malnutrition Characteristics:      Nutrition Interventions:       Estimated body mass index is 28.16 kg/m as calculated from the following:   Height as of this encounter: 6\' 2"  (1.88 m).   Weight as of this encounter: 99.5 kg.     Code Status: DNR  Family Communication: None at bedside  Disposition Plan: Back to SNF once work-up completed and patient stable   Consultants:  GI  Urology  Procedures:  None  Antimicrobials:  None  DVT prophylaxis: None due to possible GI bleed/hematuria   Objective: Vitals:   06/07/19 1549 06/07/19 1815 06/07/19 2232 06/08/19 0631  BP: 109/65  109/70 106/70  Pulse: 92     Resp: 16  20 20   Temp: 97.7 F (36.5 C)  98.1 F (36.7 C) 98.1 F (36.7 C)  TempSrc: Oral  Oral Oral  SpO2: 97%  99% 95%  Weight:      Height:  6\' 2"  (1.88 m)      Intake/Output Summary (Last 24 hours) at 06/08/2019 1612 Last data filed at 06/08/2019 1400 Gross per 24 hour  Intake 1993.46 ml  Output 2700 ml  Net -706.54 ml  Filed Weights   06/06/19 2130 06/07/19 0505  Weight: 99.4 kg 99.5 kg    Exam:  General: NAD   Cardiovascular: S1, S2 present  Respiratory: CTAB  Abdomen: Soft, nontender, nondistended, bowel sounds present  Musculoskeletal: ++ bilateral pedal edema noted  Skin: Normal  Psychiatry: Normal mood    Data Reviewed: CBC: Recent Labs  Lab 06/07/19 0142 06/07/19 1833 06/08/19 0842  WBC 4.2  --  3.4*  NEUTROABS  --   --  2.1  HGB 7.7* 8.4* 8.0*  HCT 23.0* 25.1* 24.9*  MCV 98.7  --  100.8*  PLT 232  --  099   Basic Metabolic Panel: Recent Labs   Lab 06/07/19 0142 06/08/19 0842  NA 134* 135  K 3.2* 3.0*  CL 95* 98  CO2 28 28  GLUCOSE 188* 170*  BUN 25* 18  CREATININE 1.08 0.81  CALCIUM 8.4* 7.9*   GFR: Estimated Creatinine Clearance: 76.4 mL/min (by C-G formula based on SCr of 0.81 mg/dL). Liver Function Tests: Recent Labs  Lab 06/07/19 0142  AST 23  ALT 12  ALKPHOS 68  BILITOT 1.9*  PROT 6.1*  ALBUMIN 2.6*   No results for input(s): LIPASE, AMYLASE in the last 168 hours. No results for input(s): AMMONIA in the last 168 hours. Coagulation Profile: Recent Labs  Lab 06/07/19 0753  INR 1.3*   Cardiac Enzymes: No results for input(s): CKTOTAL, CKMB, CKMBINDEX, TROPONINI in the last 168 hours. BNP (last 3 results) No results for input(s): PROBNP in the last 8760 hours. HbA1C: No results for input(s): HGBA1C in the last 72 hours. CBG: Recent Labs  Lab 06/07/19 1615 06/07/19 2259 06/08/19 0737 06/08/19 0954 06/08/19 1137  GLUCAP 161* 200* 170* 154* 188*   Lipid Profile: No results for input(s): CHOL, HDL, LDLCALC, TRIG, CHOLHDL, LDLDIRECT in the last 72 hours. Thyroid Function Tests: No results for input(s): TSH, T4TOTAL, FREET4, T3FREE, THYROIDAB in the last 72 hours. Anemia Panel: No results for input(s): VITAMINB12, FOLATE, FERRITIN, TIBC, IRON, RETICCTPCT in the last 72 hours. Urine analysis:    Component Value Date/Time   COLORURINE YELLOW 03/17/2019 2026   APPEARANCEUR CLEAR 03/17/2019 2026   LABSPEC 1.015 03/17/2019 2026   PHURINE 5.0 03/17/2019 2026   GLUCOSEU 150 (A) 03/17/2019 2026   HGBUR SMALL (A) 03/17/2019 2026   BILIRUBINUR NEGATIVE 03/17/2019 2026   KETONESUR NEGATIVE 03/17/2019 2026   PROTEINUR NEGATIVE 03/17/2019 2026   UROBILINOGEN 1.0 11/24/2014 1506   NITRITE NEGATIVE 03/17/2019 2026   LEUKOCYTESUR NEGATIVE 03/17/2019 2026   Sepsis Labs: @LABRCNTIP (procalcitonin:4,lacticidven:4)  ) Recent Results (from the past 240 hour(s))  SARS Coronavirus 2 (CEPHEID - Performed in  [place] hospital lab), Hosp Order     Status: None   Collection Time: 06/07/19  1:36 AM   Specimen: Nasopharyngeal Swab  Result Value Ref Range Status   SARS Coronavirus 2 NEGATIVE NEGATIVE Final    Comment: (NOTE) If result is NEGATIVE SARS-CoV-2 target nucleic acids are NOT DETECTED. The SARS-CoV-2 RNA is generally detectable in upper and lower  respiratory specimens during the acute phase of infection. The lowest  concentration of SARS-CoV-2 viral copies this assay can detect is 250  copies / mL. A negative result does not preclude SARS-CoV-2 infection  and should not be used as the sole basis for treatment or other  patient management decisions.  A negative result may occur with  improper specimen collection / handling, submission of specimen other  than nasopharyngeal swab, presence of viral mutation(s) within the  areas targeted by this assay, and inadequate number of viral copies  (<250 copies / mL). A negative result must be combined with clinical  observations, patient history, and epidemiological information. If result is POSITIVE SARS-CoV-2 target nucleic acids are DETECTED. The SARS-CoV-2 RNA is generally detectable in upper and lower  respiratory specimens dur ing the acute phase of infection.  Positive  results are indicative of active infection with SARS-CoV-2.  Clinical  correlation with patient history and other diagnostic information is  necessary to determine patient infection status.  Positive results do  not rule out bacterial infection or co-infection with other viruses. If result is PRESUMPTIVE POSTIVE SARS-CoV-2 nucleic acids MAY BE PRESENT.   A presumptive positive result was obtained on the submitted specimen  and confirmed on repeat testing.  While 2019 novel coronavirus  (SARS-CoV-2) nucleic acids may be present in the submitted sample  additional confirmatory testing may be necessary for epidemiological  and / or clinical management purposes  to  differentiate between  SARS-CoV-2 and other Sarbecovirus currently known to infect humans.  If clinically indicated additional testing with an alternate test  methodology [phone]) is advised. The SARS-CoV-2 RNA is generally  detectable in upper and lower respiratory sp ecimens during the acute  phase of infection. The expected result is Negative. Fact Sheet for Patients:  StrictlyIdeas.no Fact Sheet for Healthcare Providers: BankingDealers.co.za This test is not yet approved or cleared by the [place] FDA and has been authorized for detection and/or diagnosis of SARS-CoV-2 by FDA under an Emergency Use Authorization (EUA).  This EUA will remain in effect (meaning this test can be used) for the duration of the COVID-19 declaration under Section 564(b)(1) of the Act, 21 U.S.C. section 360bbb-3(b)(1), unless the authorization is terminated or revoked sooner. Performed at [facility], [place] [address] [place], [place] [address]       Studies: No results found.  Scheduled Meds: . alfuzosin  10 mg Oral Q breakfast  . amoxicillin-clavulanate  1 tablet Oral BID  . escitalopram  5 mg Oral Daily  . ezetimibe  10 mg Oral Daily  . feeding supplement (ENSURE ENLIVE)  237 mL Oral BID BM  . finasteride  5 mg Oral Daily  . insulin aspart  0-5 Units Subcutaneous QHS  . insulin aspart  0-9 Units Subcutaneous TID WC  . senna-docusate  1 tablet Oral BID  . sodium chloride flush  3 mL Intravenous Q12H  . sodium hypochlorite   Irrigation BID    Continuous Infusions: . sodium chloride    . potassium chloride 10 mEq (06/08/19 1547)     LOS: 2 days     Alma Friendly, MD [facility]  If 7PM-7AM, please contact night-coverage [email or web address] 06/08/2019, 4:12 PM

## 2019-06-08 NOTE — Consult Note (Signed)
[facility] Nurse has reviewed record and this patient has a positive xray or MRI for osteomyelitis, this is considered outside of the scope of practice for the [facility] nurse, will not continue to follow  Re-consult if only topical wound care needed after orthopedic or surgical evaluation Kristien Salatino [facility] MSN, RN, [place], [place], [place]

## 2019-06-09 LAB — CBC WITH DIFFERENTIAL/PLATELET
Abs Immature Granulocytes: 0.05 10*3/uL (ref 0.00–0.07)
Basophils Absolute: 0 10*3/uL (ref 0.0–0.1)
Basophils Relative: 0 %
Eosinophils Absolute: 0.1 10*3/uL (ref 0.0–0.5)
Eosinophils Relative: 2 %
HCT: 25.4 % — ABNORMAL LOW (ref 39.0–52.0)
Hemoglobin: 8.5 g/dL — ABNORMAL LOW (ref 13.0–17.0)
Immature Granulocytes: 1 %
Lymphocytes Relative: 27 %
Lymphs Abs: 1.1 10*3/uL (ref 0.7–4.0)
MCH: 33.3 pg (ref 26.0–34.0)
MCHC: 33.5 g/dL (ref 30.0–36.0)
MCV: 99.6 fL (ref 80.0–100.0)
Monocytes Absolute: 0.3 10*3/uL (ref 0.1–1.0)
Monocytes Relative: 6 %
Neutro Abs: 2.6 10*3/uL (ref 1.7–7.7)
Neutrophils Relative %: 64 %
Platelets: 212 10*3/uL (ref 150–400)
RBC: 2.55 MIL/uL — ABNORMAL LOW (ref 4.22–5.81)
RDW: 15.9 % — ABNORMAL HIGH (ref 11.5–15.5)
WBC: 4.1 10*3/uL (ref 4.0–10.5)
nRBC: 0 % (ref 0.0–0.2)

## 2019-06-09 LAB — BASIC METABOLIC PANEL
Anion gap: 9 (ref 5–15)
BUN: 15 mg/dL (ref 8–23)
CO2: 29 mmol/L (ref 22–32)
Calcium: 8.2 mg/dL — ABNORMAL LOW (ref 8.9–10.3)
Chloride: 99 mmol/L (ref 98–111)
Creatinine, Ser: 0.77 mg/dL (ref 0.61–1.24)
GFR calc Af Amer: >60 mL/min (ref >60)
GFR calc non Af Amer: >60 mL/min (ref >60)
Glucose, Bld: 212 mg/dL — ABNORMAL HIGH (ref 70–99)
Potassium: 3.2 mmol/L — ABNORMAL LOW (ref 3.5–5.1)
Sodium: 137 mmol/L (ref 135–145)

## 2019-06-09 LAB — GLUCOSE, CAPILLARY
Glucose-Capillary: 198 mg/dL — ABNORMAL HIGH (ref 70–99)
Glucose-Capillary: 226 mg/dL — ABNORMAL HIGH (ref 70–99)
Glucose-Capillary: 211 mg/dL — ABNORMAL HIGH (ref 70–99)
Glucose-Capillary: 263 mg/dL — ABNORMAL HIGH (ref 70–99)

## 2019-06-09 LAB — MAGNESIUM: Magnesium: 1.8 mg/dL (ref 1.7–2.4)

## 2019-06-09 MED ORDER — FUROSEMIDE 40 MG PO TABS
40.0000 mg | ORAL_TABLET | Freq: Two times a day (BID) | ORAL | Status: DC
  Administered 2019-06-09 – 2019-06-10 (×2): 40 mg via ORAL
  Filled 2019-06-09 (×2): qty 1

## 2019-06-09 MED ORDER — FUROSEMIDE 40 MG PO TABS: 40.0000 mg | ORAL_TABLET | Freq: Two times a day (BID) | ORAL | Status: DC

## 2019-06-09 MED ORDER — INSULIN GLARGINE 100 UNIT/ML ~~LOC~~ SOLN
15.0000 [IU] | Freq: Every day | SUBCUTANEOUS | Status: DC
  Administered 2019-06-09 – 2019-06-10 (×2): 15 [IU] via SUBCUTANEOUS
  Filled 2019-06-09 (×2): qty 0.15

## 2019-06-09 MED ORDER — FUROSEMIDE 10 MG/ML IJ SOLN
40.0000 mg | Freq: Once | INTRAMUSCULAR | Status: AC
  Administered 2019-06-09: 40 mg via INTRAVENOUS
  Filled 2019-06-09: qty 4

## 2019-06-09 MED ORDER — POTASSIUM CHLORIDE CRYS ER 20 MEQ PO TBCR
40.0000 meq | EXTENDED_RELEASE_TABLET | Freq: Once | ORAL | Status: AC
  Administered 2019-06-09: 40 meq via ORAL
  Filled 2019-06-09: qty 2

## 2019-06-09 NOTE — NC FL2 (Signed)
[place] LEVEL OF CARE SCREENING TOOL     IDENTIFICATION  Patient Name: Chad Fuller Birthdate: [birth year] Sex: male Admission Date (Current Location): 06/06/2019  [facility] and [place] Number:  Herbalist and Address:         Provider Number: [id number]  Attending Physician Name and Address:  Alma Friendly, MD  Relative Name and Phone Number:       Current Level of Care: Hospital Recommended Level of Care: [place] Prior Approval Number:    Date Approved/Denied:   PASRR Number: [id number]  Discharge Plan: SNF    Current Diagnoses: Patient Active Problem List   Diagnosis Date Noted  . GI bleeding 06/07/2019  . Anemia 06/07/2019  . Iron deficiency anemia 06/07/2019  . HTN (hypertension) 03/18/2019  . HLD (hyperlipidemia) 03/18/2019  . Pressure injury of skin 03/18/2019  . Acute renal failure ([place])   . Diabetes mellitus without complication ([place]) 98/33/8250  . GERD (gastroesophageal reflux disease) 03/17/2019  . BPH (benign prostatic hyperplasia) 03/17/2019  . Chronic diastolic (congestive) heart failure ([place]) 03/17/2019  . Fall 03/17/2019  . Hypokalemia 03/17/2019  . AKI (acute kidney injury) ([place]) 03/17/2019  . Elevated troponin 03/17/2019  . Rectal bleeding 07/23/2015  . GI bleed 07/23/2015  . Hypotension 07/23/2015  . Left pontine CVA ([place]) 11/20/2014  . Dysphagia, post-stroke 11/20/2014  . Left leg weakness 11/19/2014  . Type 2 diabetes mellitus ([place]) 11/19/2014  . Aortic heart murmur 11/19/2014  . Non-small cell carcinoma of lung ([place]) 11/19/2014  . Recurrent falls 11/19/2014  . Dizziness and giddiness 11/19/2014  . Stroke (cerebrum) ([place]) 11/19/2014  . Right hemiparesis ([place])   . Type II or unspecified type diabetes mellitus with neurological manifestations, not stated as uncontrolled 11/14/2013  . Pain 11/14/2013  . Closed fracture of metatarsal bone(s) 10/10/2013  . ASTHMA 02/23/2009  . CAD  (coronary artery disease) 02/05/2009  . NEOPLASM, MALIGNANT, LUNG 02/04/2009  . Diabetes ([place]) 02/04/2009  . PULMONARY EMBOLISM 02/04/2009  . COPD (chronic obstructive pulmonary disease) ([place]) 02/04/2009  . DYSPNEA 02/04/2009    Orientation RESPIRATION BLADDER Height & Weight     Self, Place  Normal Indwelling catheter Weight: 95.7 kg Height:  6\' 2"  (188 cm)  BEHAVIORAL SYMPTOMS/MOOD NEUROLOGICAL BOWEL NUTRITION STATUS      Incontinent Diet(Dysphagia 3 mech soft;nectar thik liq)  AMBULATORY STATUS COMMUNICATION OF NEEDS Skin   Limited Assist Verbally Skin abrasions, Other (Comment)(L heel ulcer-unstageable-wet to dry dsg BID;bilateral prevalon boots;scrotum blister-barrier cream)                       Personal Care Assistance Level of Assistance  Bathing, Feeding, Dressing Bathing Assistance: Limited assistance Feeding assistance: Limited assistance Dressing Assistance: Limited assistance     Functional Limitations Info             SPECIAL CARE FACTORS FREQUENCY  PT (By licensed PT), OT (By licensed OT), Speech therapy(Sliding scale insulin)     PT Frequency: 5x week OT Frequency: 5x week     Speech Therapy Frequency: 1x week      Contractures Contractures Info: Not present    Additional Factors Info  Code Status, Allergies, Insulin Sliding Scale Code Status Info: DNR Allergies Info: Pantoprazole,protonix(Pantoprazole, Protonix)   Insulin Sliding Scale Info: ssi per protocl       Current Medications (06/09/2019):  This is the current hospital active medication list Current Facility-Administered Medications  Medication Dose Route Frequency Provider Last Rate  Last Dose  . 0.9 %  sodium chloride infusion  250 mL Intravenous PRN Jani Gravel, MD      . acetaminophen (TYLENOL) tablet 650 mg  650 mg Oral Q6H PRN Jani Gravel, MD       Or  . acetaminophen (TYLENOL) suppository 650 mg  650 mg Rectal Q6H PRN Jani Gravel, MD      . alfuzosin (UROXATRAL) 24 hr tablet  10 mg  10 mg Oral Q breakfast Jani Gravel, MD   10 mg at 06/09/19 0800  . amoxicillin-clavulanate (AUGMENTIN) 875-125 MG per tablet 1 tablet  1 tablet Oral BID Jani Gravel, MD   1 tablet at 06/09/19 0936  . escitalopram (LEXAPRO) tablet 5 mg  5 mg Oral Daily Jani Gravel, MD   5 mg at 06/09/19 0937  . ezetimibe (ZETIA) tablet 10 mg  10 mg Oral Daily Jani Gravel, MD   10 mg at 06/09/19 0936  . feeding supplement (ENSURE ENLIVE) (ENSURE ENLIVE) liquid 237 mL  237 mL Oral BID BM Alma Friendly, MD   237 mL at 06/09/19 0938  . finasteride (PROSCAR) tablet 5 mg  5 mg Oral Daily Jani Gravel, MD   5 mg at 06/09/19 0936  . insulin aspart (novoLOG) injection 0-5 Units  0-5 Units Subcutaneous QHS Alma Friendly, MD   2 Units at 06/08/19 2250  . insulin aspart (novoLOG) injection 0-9 Units  0-9 Units Subcutaneous TID WC Alma Friendly, MD   2 Units at 06/09/19 0801  . iohexol (OMNIPAQUE) 300 MG/ML solution 30 mL  30 mL Oral Once PRN Alma Friendly, MD      . Resource ThickenUp Clear   Oral PRN Alma Friendly, MD      . senna-docusate (Senokot-S) tablet 1 tablet  1 tablet Oral BID Jani Gravel, MD   1 tablet at 06/09/19 0936  . sodium chloride flush (NS) 0.9 % injection 3 mL  3 mL Intravenous Q12H Jani Gravel, MD   3 mL at 06/09/19 0938  . sodium chloride flush (NS) 0.9 % injection 3 mL  3 mL Intravenous PRN Jani Gravel, MD      . sodium hypochlorite (DAKIN'S 1/4 STRENGTH) topical solution   Irrigation BID Alma Friendly, MD         Discharge Medications: Please see discharge summary for a list of discharge medications.  Relevant Imaging Results:  Relevant Lab Results:   Additional Information ss#[id number]  Dessa Phi, RN

## 2019-06-09 NOTE — TOC Initial Note (Signed)
Transition of Care [facility]) - Initial/Assessment Note    Patient Details  Name: Chad Fuller MRN: [id number] Date of Birth: 05-Jan-1926  Transition of [facility]) CM/SW Contact:    Dessa Phi, RN Phone Number: 06/09/2019, 11:10 AM  Clinical Narrative: From [facility]-rep [place] aware of admission, & fl2 faxed, will follow for return back to [facility]. TC dtr Aquilla Solian #[phone] of d/c plan to return back to [place] to d/c plan.                   Expected Discharge Plan: Skilled Nursing Facility Barriers to Discharge: Continued Medical Work up   Patient Goals and CMS Choice Patient states their goals for this hospitalization and ongoing recovery are:: get stronger CMS Medicare.gov Compare Post Acute Care list provided to:: Patient Represenative (must comment)(dtr Aquilla Solian)    Expected Discharge Plan and Services Expected Discharge Plan: [place]   Discharge Planning Services: CM Consult Post Acute Care Choice: [place] Living arrangements for the past 2 months: [place]                                      Prior Living Arrangements/Services Living arrangements for the past 2 months: [place] Lives with:: Facility Resident Patient language and need for interpreter reviewed:: Yes Do you feel safe going back to the place where you live?: Yes      Need for Family Participation in Patient Care: No (Comment) Care giver support system in place?: Yes (comment)   Criminal Activity/Legal Involvement Pertinent to Current Situation/Hospitalization: No - Comment as needed  Activities of Daily Living Home Assistive Devices/Equipment: Wheelchair ADL Screening (condition at time of admission) Patient's cognitive ability adequate to safely complete daily activities?: No Is the patient deaf or have difficulty hearing?: Yes Does the patient have difficulty seeing, even when  wearing glasses/contacts?: Yes Does the patient have difficulty concentrating, remembering, or making decisions?: Yes Patient able to express need for assistance with ADLs?: Yes Does the patient have difficulty dressing or bathing?: Yes Independently performs ADLs?: No Communication: Independent Dressing (OT): Dependent Is this a change from baseline?: Pre-admission baseline Grooming: Dependent Is this a change from baseline?: Pre-admission baseline Feeding: Needs assistance Is this a change from baseline?: Pre-admission baseline Bathing: Dependent Is this a change from baseline?: Pre-admission baseline Toileting: Dependent Is this a change from baseline?: Pre-admission baseline In/Out Bed: Dependent Is this a change from baseline?: Pre-admission baseline Walks in Home: Dependent Is this a change from baseline?: Pre-admission baseline Does the patient have difficulty walking or climbing stairs?: Yes Weakness of Legs: Both Weakness of Arms/Hands: Both  Permission Sought/Granted Permission sought to share information with : Facility Sport and exercise psychologist, Case Optician, dispensing granted to share information with : Yes, Verbal Permission Granted  Share Information with NAME: Aquilla Solian  Permission granted to share info w AGENCY: [facility]  Permission granted to share info w Relationship: dtr/SNF  Permission granted to share info w Contact Information: [phone]  Emotional Assessment Appearance:: Appears stated age Attitude/Demeanor/Rapport: Gracious Affect (typically observed): Accepting Orientation: : Oriented to Self, Oriented to Place Alcohol / Substance Use: Never Used Psych Involvement: No (comment)  Admission diagnosis:  Gastrointestinal bleed [K92.2] Patient Active Problem List   Diagnosis Date Noted  . GI bleeding 06/07/2019  . Anemia 06/07/2019  . Iron deficiency anemia 06/07/2019  .  HTN (hypertension) 03/18/2019  . HLD (hyperlipidemia) 03/18/2019   . Pressure injury of skin 03/18/2019  . Acute renal failure ([place])   . Diabetes mellitus without complication ([place]) 50/93/2671  . GERD (gastroesophageal reflux disease) 03/17/2019  . BPH (benign prostatic hyperplasia) 03/17/2019  . Chronic diastolic (congestive) heart failure ([place]) 03/17/2019  . Fall 03/17/2019  . Hypokalemia 03/17/2019  . AKI (acute kidney injury) ([place]) 03/17/2019  . Elevated troponin 03/17/2019  . Rectal bleeding 07/23/2015  . GI bleed 07/23/2015  . Hypotension 07/23/2015  . Left pontine CVA ([place]) 11/20/2014  . Dysphagia, post-stroke 11/20/2014  . Left leg weakness 11/19/2014  . Type 2 diabetes mellitus ([place]) 11/19/2014  . Aortic heart murmur 11/19/2014  . Non-small cell carcinoma of lung ([place]) 11/19/2014  . Recurrent falls 11/19/2014  . Dizziness and giddiness 11/19/2014  . Stroke (cerebrum) ([place]) 11/19/2014  . Right hemiparesis ([place])   . Type II or unspecified type diabetes mellitus with neurological manifestations, not stated as uncontrolled 11/14/2013  . Pain 11/14/2013  . Closed fracture of metatarsal bone(s) 10/10/2013  . ASTHMA 02/23/2009  . CAD (coronary artery disease) 02/05/2009  . NEOPLASM, MALIGNANT, LUNG 02/04/2009  . Diabetes ([place]) 02/04/2009  . PULMONARY EMBOLISM 02/04/2009  . COPD (chronic obstructive pulmonary disease) ([place]) 02/04/2009  . DYSPNEA 02/04/2009   PCP:  Lajean Manes, MD Pharmacy:   [id number] [place], [place] [place] [address] Phone: [phone] Fax: [phone]  [facility] [place], [address] [place] [address] Phone: [phone] Fax: [phone]     Social Determinants of Health (SDOH) Interventions    Readmission Risk Interventions Readmission Risk Prevention Plan 03/20/2019  Transportation Screening Complete  PCP or Specialist Appt within 5-7 Days Complete  Home Care Screening  Complete  Medication Review (RN CM) Complete  Some recent data might be hidden

## 2019-06-09 NOTE — Progress Notes (Signed)
PROGRESS NOTE  Chad Fuller HWE:[id number] DOB: 1926-02-03 DOA: 06/06/2019 PCP: Lajean Manes, MD  HPI/Recap of past 24 hours: HPI from Chad Fuller  is a 85 y.o. male, w hypertension, hyperlipidemia, dm2, CAD, Chronic diastolic CHF (per SNF FL2), CVA, Dementia , h/o PE, lung cancer, BPH, presents due to being sent from SNF with lab abnormality of anemia with hgb of 5.9 (05/12/2019  hgb 9.0). Pt sent to [facility] ER for evaluation. In ED, T97.8, P 88, R 20,  Bp 113/58  Pox 96% RA..Wbc 4.2 , Hgb 5.9, Plt 253. Iron 20, TIBC 187  Iron sat 10.6 % FOBT +. Pt received 1 unit prbc in ER. Pt admitted for Iron deficiency anemia, possible GI bleeding.     Today, patient reports no new complaints.  Assessment/Plan: Principal Problem:   GI bleeding Active Problems:   Diabetes (HCC)   CAD (coronary artery disease)   COPD (chronic obstructive pulmonary disease) (HCC)   Anemia   Iron deficiency anemia  Severe anemia, iron deficiency/FOBT positive/?GI bleed Hemoglobin 5.9 on presentation, FOBT positive Status post 2U PRBC  GI on board: No further work-up as there are no overt signs of active GI bleeding.  Discussed with son and elected to transfuse blood as needed and follow patient clinically Daily CBC Monitor closely  Acute urinary retention/bladder outlet obstruction CT abdomen showed marked distention of the urinary bladder measuring up to 24.8 cm, with dilated ureters with bilateral moderate hydronephrosis, nonobstructing 7 mm stone in the midportion of the left kidney Placed in Foley catheter-drained about 3200 mils of urine Urology consulted-follow-up as an outpatient  Left heel ulcer with osteomyelitis Afebrile, no leukocytosis X-ray foot showed possible osteomyelitis MRI here showed osteomyelitis Continue PO Augmentin (okay to continue as discussed with ID Chad Fuller on 06/09/2019) Discussed with Chad. [name] on 06/09/2019, recommend following up in outpatient clinic.  No  indication right now for any surgical intervention especially given advanced age  Hypokalemia Replace PRN  Chronic diastolic HF Appears decompensated Chest x-ray with mild vascular congestion Stop ASA, held p.o. Lasix due to low BP in the setting of possible GI bleed Re-start home PO lasix and monitor BP closely  Diabetes mellitus type 2 Sliding scale, lantus, hypoglycemic protocol Hold home regimen for now  BPH Continue Proscar, uroxatral  Hypertension Hold losartan due to borderline low BP  Anxiety/dementia Continue Lexapro         Malnutrition Type:      Malnutrition Characteristics:      Nutrition Interventions:       Estimated body mass index is 27.08 kg/m as calculated from the following:   Height as of this encounter: 6\' 2"  (8.93 m).   Weight as of this encounter: 95.7 kg.     Code Status: DNR  Family Communication: Discussed with daughter in law in detail about patient    Disposition Plan: Back to SNF once work-up completed and patient stable   Consultants:  GI  Urology  Procedures:  None  Antimicrobials:  None  DVT prophylaxis: None due to possible GI bleed/hematuria   Objective: Vitals:   06/08/19 1700 06/08/19 2148 06/09/19 0419 06/09/19 1328  BP: 110/74 119/68 119/73 125/71  Pulse: 90 92 88 75  Resp: 20 18 16 18   Temp: 98.7 F (37.1 C) 98 F (36.7 C) 98 F (36.7 C) 97.6 F (36.4 C)  TempSrc: Oral Oral Oral Oral  SpO2: 96% 97% 100% 98%  Weight:   95.7 kg   Height:  Intake/Output Summary (Last 24 hours) at 06/09/2019 1457 Last data filed at 06/09/2019 1330 Gross per 24 hour  Intake 972.21 ml  Output 2375 ml  Net -1402.79 ml   Filed Weights   06/06/19 2130 06/07/19 0505 06/09/19 0419  Weight: 99.4 kg 99.5 kg 95.7 kg    Exam:  General: NAD   Cardiovascular: S1, S2 present  Respiratory: CTAB  Abdomen: Soft, nontender, nondistended, bowel sounds present  Musculoskeletal: +bilateral pedal edema  noted, scrotal edema  Skin: Normal  Psychiatry: Normal mood    Data Reviewed: CBC: Recent Labs  Lab 06/07/19 0142 06/07/19 1833 06/08/19 0842 06/09/19 0616  WBC 4.2  --  3.4* 4.1  NEUTROABS  --   --  2.1 2.6  HGB 7.7* 8.4* 8.0* 8.5*  HCT 23.0* 25.1* 24.9* 25.4*  MCV 98.7  --  100.8* 99.6  PLT 232  --  220 174   Basic Metabolic Panel: Recent Labs  Lab 06/07/19 0142 06/08/19 0842 06/09/19 0616  NA 134* 135 137  K 3.2* 3.0* 3.2*  CL 95* 98 99  CO2 28 28 29   GLUCOSE 188* 170* 212*  BUN 25* 18 15  CREATININE 1.08 0.81 0.77  CALCIUM 8.4* 7.9* 8.2*  MG  --   --  1.8   GFR: Estimated Creatinine Clearance: 71.4 mL/min (by C-G formula based on SCr of 0.77 mg/dL). Liver Function Tests: Recent Labs  Lab 06/07/19 0142  AST 23  ALT 12  ALKPHOS 68  BILITOT 1.9*  PROT 6.1*  ALBUMIN 2.6*   No results for input(s): LIPASE, AMYLASE in the last 168 hours. No results for input(s): AMMONIA in the last 168 hours. Coagulation Profile: Recent Labs  Lab 06/07/19 0753  INR 1.3*   Cardiac Enzymes: No results for input(s): CKTOTAL, CKMB, CKMBINDEX, TROPONINI in the last 168 hours. BNP (last 3 results) No results for input(s): PROBNP in the last 8760 hours. HbA1C: No results for input(s): HGBA1C in the last 72 hours. CBG: Recent Labs  Lab 06/08/19 1137 06/08/19 1621 06/08/19 2150 06/09/19 0743 06/09/19 1151  GLUCAP 188* 212* 232* 198* 226*   Lipid Profile: No results for input(s): CHOL, HDL, LDLCALC, TRIG, CHOLHDL, LDLDIRECT in the last 72 hours. Thyroid Function Tests: No results for input(s): TSH, T4TOTAL, FREET4, T3FREE, THYROIDAB in the last 72 hours. Anemia Panel: No results for input(s): VITAMINB12, FOLATE, FERRITIN, TIBC, IRON, RETICCTPCT in the last 72 hours. Urine analysis:    Component Value Date/Time   COLORURINE YELLOW 03/17/2019 2026   APPEARANCEUR CLEAR 03/17/2019 2026   LABSPEC 1.015 03/17/2019 2026   PHURINE 5.0 03/17/2019 2026   GLUCOSEU 150  (A) 03/17/2019 2026   HGBUR SMALL (A) 03/17/2019 2026   BILIRUBINUR NEGATIVE 03/17/2019 2026   KETONESUR NEGATIVE 03/17/2019 2026   PROTEINUR NEGATIVE 03/17/2019 2026   UROBILINOGEN 1.0 11/24/2014 1506   NITRITE NEGATIVE 03/17/2019 2026   LEUKOCYTESUR NEGATIVE 03/17/2019 2026   Sepsis Labs: @LABRCNTIP (procalcitonin:4,lacticidven:4)  ) Recent Results (from the past 240 hour(s))  SARS Coronavirus 2 (CEPHEID - Performed in [place] hospital lab), Hosp Order     Status: None   Collection Time: 06/07/19  1:36 AM   Specimen: Nasopharyngeal Swab  Result Value Ref Range Status   SARS Coronavirus 2 NEGATIVE NEGATIVE Final    Comment: (NOTE) If result is NEGATIVE SARS-CoV-2 target nucleic acids are NOT DETECTED. The SARS-CoV-2 RNA is generally detectable in upper and lower  respiratory specimens during the acute phase of infection. The lowest  concentration of SARS-CoV-2 viral copies  this assay can detect is 250  copies / mL. A negative result does not preclude SARS-CoV-2 infection  and should not be used as the sole basis for treatment or other  patient management decisions.  A negative result may occur with  improper specimen collection / handling, submission of specimen other  than nasopharyngeal swab, presence of viral mutation(s) within the  areas targeted by this assay, and inadequate number of viral copies  (<250 copies / mL). A negative result must be combined with clinical  observations, patient history, and epidemiological information. If result is POSITIVE SARS-CoV-2 target nucleic acids are DETECTED. The SARS-CoV-2 RNA is generally detectable in upper and lower  respiratory specimens dur ing the acute phase of infection.  Positive  results are indicative of active infection with SARS-CoV-2.  Clinical  correlation with patient history and other diagnostic information is  necessary to determine patient infection status.  Positive results do  not rule out bacterial  infection or co-infection with other viruses. If result is PRESUMPTIVE POSTIVE SARS-CoV-2 nucleic acids MAY BE PRESENT.   A presumptive positive result was obtained on the submitted specimen  and confirmed on repeat testing.  While 2019 novel coronavirus  (SARS-CoV-2) nucleic acids may be present in the submitted sample  additional confirmatory testing may be necessary for epidemiological  and / or clinical management purposes  to differentiate between  SARS-CoV-2 and other Sarbecovirus currently known to infect humans.  If clinically indicated additional testing with an alternate test  methodology [phone]) is advised. The SARS-CoV-2 RNA is generally  detectable in upper and lower respiratory sp ecimens during the acute  phase of infection. The expected result is Negative. Fact Sheet for Patients:  StrictlyIdeas.no Fact Sheet for Healthcare Providers: BankingDealers.co.za This test is not yet approved or cleared by the [place] FDA and has been authorized for detection and/or diagnosis of SARS-CoV-2 by FDA under an Emergency Use Authorization (EUA).  This EUA will remain in effect (meaning this test can be used) for the duration of the COVID-19 declaration under Section 564(b)(1) of the Act, 21 U.S.C. section 360bbb-3(b)(1), unless the authorization is terminated or revoked sooner. Performed at [facility], [place] [address], [place], [place] [address]       Studies: Mr Heel Left Wo Contrast  Result Date: 06/08/2019 CLINICAL DATA:  Osteomyelitis. EXAM: MR OF THE LEFT HEEL WITHOUT CONTRAST TECHNIQUE: Multiplanar, multisequence MR imaging of the ankle was performed. No intravenous contrast was administered. COMPARISON:  Radiographs from 06/07/2019 FINDINGS: Despite efforts by the technologist and patient, motion artifact is present on today's exam and could not be eliminated. This reduces exam sensitivity and  specificity. TENDONS Peroneal: Common peroneus tendon sheath tenosynovitis extending peroneus longus tendon sheath. Posteromedial: Mild flexor hallucis longus tenosynovitis just past the knot of Henry. Anterior: Grossly unremarkable Achilles: Grossly unremarkable Plantar Fascia: Thickened medial band suggesting mild plantar fasciitis. LIGAMENTS Lateral: Unremarkable Medial: Grossly unremarkable CARTILAGE Ankle Joint: No osteochondral lesion identified. Moderate degenerative chondral thinning diffusely. Subtalar Joints/Sinus Tarsi: Grossly unremarkable Bones: Osteomyelitis of the posterior calcaneus with scalloped order of the posterior calcaneus, abnormal osseous edema, overlying ulcer, and possible gas in the soft tissues extending towards the bone. Other: Ulceration of the posterior heel. Subcutaneous edema overlying the medial and lateral malleoli. IMPRESSION: 1. Large ulceration along the posterior calcaneus with underlying posterior calcaneal osteomyelitis and erosion. 2. peroneus and flexor hallucis longus tenosynovitis. 3. Mild medial band plantar fasciitis. 4. Despite efforts by the technologist and patient, motion artifact is  present on today's exam and could not be eliminated. This reduces exam sensitivity and specificity. Electronically Signed   By: Van Clines M.D.   On: 06/08/2019 20:10    Scheduled Meds: . alfuzosin  10 mg Oral Q breakfast  . amoxicillin-clavulanate  1 tablet Oral BID  . escitalopram  5 mg Oral Daily  . ezetimibe  10 mg Oral Daily  . feeding supplement (ENSURE ENLIVE)  237 mL Oral BID BM  . finasteride  5 mg Oral Daily  . furosemide  40 mg Oral BID  . insulin aspart  0-5 Units Subcutaneous QHS  . insulin aspart  0-9 Units Subcutaneous TID WC  . insulin glargine  15 Units Subcutaneous Daily  . senna-docusate  1 tablet Oral BID  . sodium chloride flush  3 mL Intravenous Q12H  . sodium hypochlorite   Irrigation BID    Continuous Infusions: . sodium chloride        LOS: 3 days     Alma Friendly, MD [facility]  If 7PM-7AM, please contact night-coverage [email or web address] 06/09/2019, 2:57 PM

## 2019-06-09 NOTE — Progress Notes (Signed)
  Speech Language Pathology Treatment: Dysphagia  Patient Details Name: Chad Fuller MRN: [id number] DOB: June 10, 1926 Today's Date: 06/09/2019 Time: 9373-4287 SLP Time Calculation (min) (ACUTE ONLY): 14 min  Assessment / Plan / Recommendation Clinical Impression  Pt visit to determine readiness for dietary advancement indicated.  Thin water located in room with straw and SLP provided it to pt.  Immediate significant coughing after intake of thin water via straw - even with small single boluses, he continues to demonstrate aspiration.   After overt coughing episode, SLP apologized to pt to which he said "That's ok, that happens all the time".    This pt has chronic dysphagia and aspiration from my conversation with him yesterday and today.  Therefore while he is in the hospital ill, do NOT recommend he have thin liquids via cup/straw.   Recommend nectar thick liquids with meals or any other po intake.    Water OK via tsp between meals after oral care.  Recommend to advance diet to allow thin liquids upon discharging the hospital for pt pleasure, hydration,etc as this will be helpful with his hydration after acute illness resolves.  Pt himself states he doesn't mind choking if he gets water.  Water being Ph neutral will make it better tolerated when aspirated.    SLP phoned RN [name] and requested water container be removed and reviewed swallowing recommendation/strategies with her.  HPI HPI: pt is a 85 yo male adm to [facility] with concern for sepsis due to possible urinary retention s/p TURP.  H/O CVA with dysphagia, CAD, dementia, DM, lung cancer and prior smoker.  Pt CXR showed left more than right pleural effusions with bibasilar ATX, stable right lung nodule.  Prior MBS 03/18/2019 showed silent aspiration, penetration and aspiration of larger sips, delayed swallow to pyriform sinus.  Pt has a moderate HH per imaging study.      SLP Plan  Continue with current plan of care       Recommendations   Diet recommendations: Dysphagia 3 (mechanical soft);Nectar-thick liquid(thin water between meals via tsp) Liquids provided via: Cup;Teaspoon Medication Administration: Whole meds with puree Supervision: Staff to assist with self feeding Compensations: Slow rate;Small sips/bites;Other (Comment)(oral suction prior to and after po, remove dentures nightly and clean) Postural Changes and/or Swallow Maneuvers: Seated upright 90 degrees;Upright 30-60 min after meal                Oral Care Recommendations: Oral care BID SLP Visit Diagnosis: Dysphagia, oropharyngeal phase (R13.12) Plan: Continue with current plan of care       GO              Luanna Salk, [place] [facility] SLP [place] Pager [phone] Office [phone]  Macario Golds 06/09/2019, 1:52 PM

## 2019-06-09 NOTE — Progress Notes (Signed)
Initial Nutrition Assessment  INTERVENTION:   -Continue Ensure Enlive po BID, each supplement provides 350 kcal and 20 grams of protein -thickened -Provide Magic cup BID with meals, each supplement provides 290 kcal and 9 grams of protein  NUTRITION DIAGNOSIS:   Inadequate oral intake related to dysphagia, lethargy/confusion as evidenced by meal completion < 25%.  GOAL:   Patient will meet greater than or equal to 90% of their needs  MONITOR:   PO intake, Supplement acceptance, Labs, Weight trends, I & O's, Skin  REASON FOR ASSESSMENT:   Malnutrition Screening Tool    ASSESSMENT:   85 y.o. male, w hypertension, hyperlipidemia, dm2, CAD, Chronic diastolic CHF (per SNF FL2), CVA, Dementia , h/o PE, lung cancer, BPH, presents due to being sent from SNF with lab abnormality of anemia. Pt admitted for Iron deficiency anemia, possible GI bleeding.  **RD working remotely**  Patient an unreliable historian, alert/oriented x 2. Pt was evaluated by SLP, mild-moderate aspiration risk. Recommended dysphagia 3 diet with nectar thick liquids. This morning pt ate  25% of some grits, eggs, applesauce and cranberry juice. Pt consumed 10% of lunch which consisted of applesauce, bites of mac and cheese, tea and apple juice. Ensure supplement have been ordered. Will also order Magic Cups on meals.  Per weight records, pt weighed 197 lbs on 3/21, weights have increased since then. Pt with severe BLE edema. Per I/O's: -4.8L since admit. Pt is receiving Lasix.   Medications: Lasix tablet BID Labs reviewed: CBGs: 198-226 Low K  NUTRITION - FOCUSED PHYSICAL EXAM:  Unable to perform per department requirement to work remotely.  Diet Order:   Diet Order            DIET DYS 3 Room service appropriate? No; Fluid consistency: Nectar Thick  Diet effective now              EDUCATION NEEDS:   Not appropriate for education at this time  Skin:  Skin Assessment: Skin Integrity Issues: Skin  Integrity Issues:: Unstageable Unstageable: left heel -osteomyelitis  Last BM:  6/11  Height:   Ht Readings from Last 1 Encounters:  06/07/19 _0  (1.88 m)    Weight:   Wt Readings from Last 1 Encounters:  06/09/19 95.7 kg    Ideal Body Weight:  86.3 kg  BMI:  Body mass index is 27.08 kg/m.  Estimated Nutritional Needs:   Kcal:  2000-2200  Protein:  95-105g  Fluid:  2L/day   Clayton Bibles, MS, RD, LDN [place] Dietitian Pager: [phone] After Hours Pager: [phone]

## 2019-06-09 NOTE — Consult Note (Signed)
   [facility] CM Inpatient Consult   06/09/2019  ANDRW MCGUIRT 1926/11/27 [id number]   Patient chart has been reviewed for unplanned readmission risk score of 26%, high.   Per chart review, disposition plan is to return back to [facility].  No [facility] identifiable needs.  Netta Cedars, MSN, [place] [facility] Liaison Nurse Mobile Phone [phone]  Toll free office [phone]

## 2019-06-09 NOTE — TOC Progression Note (Signed)
Transition of Care [facility]) - Progression Note    Patient Details  Name: Chad Fuller MRN: [id number] Date of Birth: [birth year]  [facility] of [facility] CM/SW Contact  Avyukt Cimo, Juliann Pulse, RN Phone Number: 06/09/2019, 12:36 PM  Clinical Narrative: Damaris Schooner to [facility] rep [place]-if d/c tomorrow call report to [facility] tel#[phone]. Patient will go to rm#[id number] if d/c in am. DNR in shadow chart for MD signature.      Expected Discharge Plan: [place] Barriers to Discharge: Continued Medical Work up  Expected Discharge Plan and Services Expected Discharge Plan: [place]   Discharge Planning Services: CM Consult Post Acute Care Choice: [place] Living arrangements for the past 2 months: [place]                                       Social Determinants of Health (SDOH) Interventions    Readmission Risk Interventions Readmission Risk Prevention Plan 03/20/2019  Transportation Screening Complete  PCP or Specialist Appt within 5-7 Days Complete  Home Care Screening Complete  Medication Review (RN CM) Complete  Some recent data might be hidden

## 2019-06-09 NOTE — Progress Notes (Signed)
Inpatient Diabetes Program Recommendations  AACE/ADA: New Consensus Statement on Inpatient Glycemic Control (2015)  Target Ranges:  Prepandial:   less than 140 mg/dL      Peak postprandial:   less than 180 mg/dL (1-2 hours)      Critically ill patients:  140 - 180 mg/dL   Lab Results  Component Value Date   GLUCAP 226 (H) 06/09/2019   HGBA1C 9.7 (H) 03/18/2019    Review of Glycemic Control Results for Chad Fuller, Chad Fuller (MRN [id number]) as of 06/09/2019 13:41  Ref. Range 06/08/2019 11:37 06/08/2019 16:21 06/08/2019 21:50 06/09/2019 07:43 06/09/2019 11:51  Glucose-Capillary Latest Ref Range: 70 - 99 mg/dL 188 (H) 212 (H) 232 (H) 198 (H) 226 (H)   Diabetes history: DM2 Outpatient Diabetes medications: Lantus 30 units + Humalog 3 units tid meal coverage Current orders for Inpatient glycemic control: Novolog sensitive tid + hs 0-5 units  Inpatient Diabetes Program Recommendations:   Lantus 15 units daily (50% regular insulin dose)  Thank you, Bethena Roys E. Aspynn Clover, RN, MSN, CDE  Diabetes Coordinator Inpatient Glycemic Control Team Team Pager [phone] (8am-5pm) 06/09/2019 1:50 PM

## 2019-06-09 NOTE — Care Management Important Message (Signed)
Important Message  Patient Details IM Letter given to Dessa Phi RN to present to the Patient Name: Chad Fuller MRN: [id number] Date of Birth: Oct 30, 1926   Medicare Important Message Given:  Yes    Kerin Salen 06/09/2019, 11:01 AM

## 2019-06-09 NOTE — Progress Notes (Signed)
OT Cancellation Note  Patient Details Name: LOTHAR PREHN MRN: [id number] DOB: 1926/03/18   Cancelled Treatment:    Reason Eval/Treat Not Completed: Other (comment)  Noted pt is from a SNF returning to a SNF- will defer any OT needs to SNF  Kari Baars, [place] [phone] Office- [phone], Edwena Felty D 06/09/2019, 10:36 AM

## 2019-06-10 DIAGNOSIS — N130 Hydronephrosis with ureteropelvic junction obstruction: Secondary | ICD-10-CM | POA: Diagnosis not present

## 2019-06-10 DIAGNOSIS — I10 Essential (primary) hypertension: Secondary | ICD-10-CM | POA: Diagnosis not present

## 2019-06-10 DIAGNOSIS — L97421 Non-pressure chronic ulcer of left heel and midfoot limited to breakdown of skin: Secondary | ICD-10-CM | POA: Diagnosis not present

## 2019-06-10 DIAGNOSIS — E440 Moderate protein-calorie malnutrition: Secondary | ICD-10-CM | POA: Diagnosis not present

## 2019-06-10 DIAGNOSIS — E785 Hyperlipidemia, unspecified: Secondary | ICD-10-CM | POA: Diagnosis not present

## 2019-06-10 DIAGNOSIS — K922 Gastrointestinal hemorrhage, unspecified: Secondary | ICD-10-CM | POA: Diagnosis not present

## 2019-06-10 DIAGNOSIS — I6340 Cerebral infarction due to embolism of unspecified cerebral artery: Secondary | ICD-10-CM | POA: Diagnosis not present

## 2019-06-10 DIAGNOSIS — R58 Hemorrhage, not elsewhere classified: Secondary | ICD-10-CM | POA: Diagnosis not present

## 2019-06-10 DIAGNOSIS — I5032 Chronic diastolic (congestive) heart failure: Secondary | ICD-10-CM | POA: Diagnosis not present

## 2019-06-10 DIAGNOSIS — I959 Hypotension, unspecified: Secondary | ICD-10-CM | POA: Diagnosis not present

## 2019-06-10 DIAGNOSIS — N390 Urinary tract infection, site not specified: Secondary | ICD-10-CM | POA: Diagnosis not present

## 2019-06-10 DIAGNOSIS — E1142 Type 2 diabetes mellitus with diabetic polyneuropathy: Secondary | ICD-10-CM | POA: Diagnosis not present

## 2019-06-10 DIAGNOSIS — Z794 Long term (current) use of insulin: Secondary | ICD-10-CM

## 2019-06-10 DIAGNOSIS — E11622 Type 2 diabetes mellitus with other skin ulcer: Secondary | ICD-10-CM

## 2019-06-10 DIAGNOSIS — I2510 Atherosclerotic heart disease of native coronary artery without angina pectoris: Secondary | ICD-10-CM | POA: Diagnosis not present

## 2019-06-10 DIAGNOSIS — M869 Osteomyelitis, unspecified: Secondary | ICD-10-CM

## 2019-06-10 DIAGNOSIS — M86172 Other acute osteomyelitis, left ankle and foot: Secondary | ICD-10-CM | POA: Diagnosis not present

## 2019-06-10 DIAGNOSIS — J440 Chronic obstructive pulmonary disease with acute lower respiratory infection: Secondary | ICD-10-CM | POA: Diagnosis not present

## 2019-06-10 DIAGNOSIS — R402411 Glasgow coma scale score 13-15, in the field [EMT or ambulance]: Secondary | ICD-10-CM | POA: Diagnosis not present

## 2019-06-10 DIAGNOSIS — L89620 Pressure ulcer of left heel, unstageable: Secondary | ICD-10-CM | POA: Diagnosis not present

## 2019-06-10 DIAGNOSIS — L89302 Pressure ulcer of unspecified buttock, stage 2: Secondary | ICD-10-CM | POA: Diagnosis not present

## 2019-06-10 DIAGNOSIS — M2550 Pain in unspecified joint: Secondary | ICD-10-CM | POA: Diagnosis not present

## 2019-06-10 DIAGNOSIS — L89890 Pressure ulcer of other site, unstageable: Secondary | ICD-10-CM | POA: Diagnosis not present

## 2019-06-10 DIAGNOSIS — E876 Hypokalemia: Secondary | ICD-10-CM | POA: Diagnosis not present

## 2019-06-10 DIAGNOSIS — E119 Type 2 diabetes mellitus without complications: Secondary | ICD-10-CM | POA: Diagnosis not present

## 2019-06-10 DIAGNOSIS — R338 Other retention of urine: Secondary | ICD-10-CM

## 2019-06-10 DIAGNOSIS — I6350 Cerebral infarction due to unspecified occlusion or stenosis of unspecified cerebral artery: Secondary | ICD-10-CM | POA: Diagnosis not present

## 2019-06-10 DIAGNOSIS — D509 Iron deficiency anemia, unspecified: Secondary | ICD-10-CM | POA: Diagnosis not present

## 2019-06-10 DIAGNOSIS — R339 Retention of urine, unspecified: Secondary | ICD-10-CM | POA: Diagnosis not present

## 2019-06-10 DIAGNOSIS — D500 Iron deficiency anemia secondary to blood loss (chronic): Secondary | ICD-10-CM | POA: Diagnosis not present

## 2019-06-10 DIAGNOSIS — K625 Hemorrhage of anus and rectum: Secondary | ICD-10-CM | POA: Diagnosis not present

## 2019-06-10 DIAGNOSIS — D649 Anemia, unspecified: Secondary | ICD-10-CM | POA: Diagnosis not present

## 2019-06-10 DIAGNOSIS — Z7401 Bed confinement status: Secondary | ICD-10-CM | POA: Diagnosis not present

## 2019-06-10 DIAGNOSIS — D62 Acute posthemorrhagic anemia: Secondary | ICD-10-CM | POA: Diagnosis not present

## 2019-06-10 LAB — CBC WITH DIFFERENTIAL/PLATELET
Abs Immature Granulocytes: 0.02 10*3/uL (ref 0.00–0.07)
Basophils Absolute: 0 10*3/uL (ref 0.0–0.1)
Basophils Relative: 1 %
Eosinophils Absolute: 0.1 10*3/uL (ref 0.0–0.5)
Eosinophils Relative: 3 %
HCT: 25.4 % — ABNORMAL LOW (ref 39.0–52.0)
Hemoglobin: 8.4 g/dL — ABNORMAL LOW (ref 13.0–17.0)
Immature Granulocytes: 1 %
Lymphocytes Relative: 40 %
Lymphs Abs: 1.4 10*3/uL (ref 0.7–4.0)
MCH: 33.1 pg (ref 26.0–34.0)
MCHC: 33.1 g/dL (ref 30.0–36.0)
MCV: 100 fL (ref 80.0–100.0)
Monocytes Absolute: 0.2 10*3/uL (ref 0.1–1.0)
Monocytes Relative: 6 %
Neutro Abs: 1.7 10*3/uL (ref 1.7–7.7)
Neutrophils Relative %: 49 %
Platelets: 210 10*3/uL (ref 150–400)
RBC: 2.54 MIL/uL — ABNORMAL LOW (ref 4.22–5.81)
RDW: 15.2 % (ref 11.5–15.5)
WBC: 3.4 10*3/uL — ABNORMAL LOW (ref 4.0–10.5)
nRBC: 0 % (ref 0.0–0.2)

## 2019-06-10 LAB — BASIC METABOLIC PANEL
Anion gap: 8 (ref 5–15)
BUN: 12 mg/dL (ref 8–23)
CO2: 30 mmol/L (ref 22–32)
Calcium: 8.2 mg/dL — ABNORMAL LOW (ref 8.9–10.3)
Chloride: 100 mmol/L (ref 98–111)
Creatinine, Ser: 0.7 mg/dL (ref 0.61–1.24)
GFR calc Af Amer: >60 mL/min (ref >60)
GFR calc non Af Amer: >60 mL/min (ref >60)
Glucose, Bld: 197 mg/dL — ABNORMAL HIGH (ref 70–99)
Potassium: 3.4 mmol/L — ABNORMAL LOW (ref 3.5–5.1)
Sodium: 138 mmol/L (ref 135–145)

## 2019-06-10 LAB — GLUCOSE, CAPILLARY
Glucose-Capillary: 195 mg/dL — ABNORMAL HIGH (ref 70–99)
Glucose-Capillary: 241 mg/dL — ABNORMAL HIGH (ref 70–99)

## 2019-06-10 MED ORDER — FUROSEMIDE 40 MG PO TABS: 40.0000 mg | ORAL_TABLET | Freq: Two times a day (BID) | ORAL | Status: DC

## 2019-06-10 MED ORDER — DAKINS (1/4 STRENGTH) 0.125 % EX SOLN: Freq: Two times a day (BID) | CUTANEOUS | 0 refills | Status: DC

## 2019-06-10 NOTE — Progress Notes (Signed)
Report called and given to Wonda Olds, at [facility] skilled nursing facility.

## 2019-06-10 NOTE — TOC Transition Note (Addendum)
Transition of Care [facility]) - CM/SW Discharge Note   Patient Details  Name: Chad Fuller MRN: [id number] Date of Birth: May 07, 1926  [facility] CM/SW Contact:  Lia Hopping, [place] Phone Number: 06/10/2019, 1:23 PM   Clinical Narrative:    D/C sent to fax [phone], waiting for [facility] to gather paperwork.  Nurse call report to: [phone] "[facility] to transport the patient   Final next level of care: Skilled Nursing Facility Barriers to Discharge: No Barriers Identified   Patient Goals and CMS Choice Patient states their goals for this hospitalization and ongoing recovery are:: get better CMS Medicare.gov Compare Post Acute Care list provided to:: Patient Represenative (must comment)(dtr Aquilla Solian)    Discharge Placement   Existing PASRR number confirmed : 06/09/19          Patient chooses bed at: [facility] [place](returning) Patient to be transferred to facility by: [place] Name of family member notified: Aquilla Solian Patient and family notified of of transfer: 06/10/19  Discharge Plan and Services   Discharge Planning Services: CM Consult Post Acute Care Choice: [place]                               Social Determinants of Health (SDOH) Interventions     Readmission Risk Interventions Readmission Risk Prevention Plan 03/20/2019  Transportation Screening Complete  PCP or Specialist Appt within 5-7 Days Complete  Home Care Screening Complete  Medication Review (RN CM) Complete  Some recent data might be hidden

## 2019-06-10 NOTE — Discharge Summary (Signed)
Discharge Summary  Chad Fuller VFI:[id number] DOB: 20-May-1926  PCP: Lajean Manes, MD  Admit date: 06/06/2019 Discharge date: 06/10/2019  Time spent: 35 mins  Recommendations for Outpatient Follow-up:  1. PCP 2. Urology 3. Orthopedics  Discharge Diagnoses:  Active Hospital Problems   Diagnosis Date Noted   GI bleeding 06/07/2019   Anemia 06/07/2019   Iron deficiency anemia 06/07/2019   CAD (coronary artery disease) 02/05/2009   Diabetes ([place]) 02/04/2009   COPD (chronic obstructive pulmonary disease) ([place]) 02/04/2009    Resolved Hospital Problems  No resolved problems to display.    Discharge Condition: Stable  Diet recommendation: Dysphagia 3 diet, nectar thick  Vitals:   06/09/19 2131 06/10/19 0426  BP: 114/70 116/74  Pulse: 81 82  Resp: 18 20  Temp: 98.3 F (36.8 C) 98.1 F (36.7 C)  SpO2:  97%    History of present illness:  Chad Fuller a89 y.o.male,w hypertension, hyperlipidemia, dm2, CAD, Chronic diastolic CHF (per SNF FL2), CVA, Dementia , h/o PE, lung cancer, BPH, presents due to being sent from SNF with lab abnormality of anemia with hgb of 5.9 (05/12/2019 hgb 9.0). Pt sent to [facility] ER for evaluation. In ED, T97.8, P 88, R 20, Bp 113/58 Pox 96% RA..Wbc 4.2 , Hgb 5.9, Plt 253. Iron 20, TIBC 187 Iron sat 10.6 % FOBT +. Pt received 1 unit prbc in ER. Pt admitted for Iron deficiency anemia, possible GI bleeding.   Today, pt reported feeling ok. Denies any new complaints. Stable to transfer back to SNF with close follow up with Urology, orthopedics.  Hospital Course:  Principal Problem:   GI bleeding Active Problems:   Diabetes (HCC)   CAD (coronary artery disease)   COPD (chronic obstructive pulmonary disease) (HCC)   Anemia   Iron deficiency anemia   Severe anemia, iron deficiency/FOBT positive/?GI bleed Hemoglobin 5.9 on presentation, FOBT positive Status post 2U PRBC  GI on board: No further work-up as there are no overt  signs of active GI bleeding. Discussed with son and elected to transfuse blood as needed, and monitor closely Repeat CBC in 1 week Stop Aspirin  Acute urinary retention/bladder outlet obstruction CT abdomen showed marked distention of the urinary bladder measuring up to 24.8 cm, with dilated ureters with bilateral moderate hydronephrosis, nonobstructing 7 mm stone in the midportion of the left kidney Placed in Foley catheter-drained about 3200 mls of urine Urology consulted-follow-up as an outpatient, keep foley in till appointment for a voiding trial.  Left heel ulcer with osteomyelitis Afebrile, no leukocytosis X-ray and MRI foot confirmed osteomyelitis Continue PO Augmentin (okay to continue as discussed with ID Dr Megan Salon on 06/09/2019) Discussed with Dr. [name] on 06/09/2019, recommend following up in outpatient clinic.  No indication right now for any surgical intervention especially given advanced age UNA boots and wound care   Hypokalemia Replaced PRN  Chronic diastolic HF Stable Chest x-ray with mild vascular congestion Stop ASA Continue PO lasix and monitor BP closely  Diabetes mellitus type 2 Continue home regimen  BPH Continue Proscar, uroxatral  Hypertension Continue losartan, monitor BP closely  Anxiety/dementia Continue Lexapro         Malnutrition Type:  Nutrition Problem: Inadequate oral intake Etiology: dysphagia, lethargy/confusion   Malnutrition Characteristics:  Signs/Symptoms: meal completion < 25%   Nutrition Interventions:  Interventions: Ensure Enlive (each supplement provides 350kcal and 20 grams of protein), Magic cup   Estimated body mass index is 27.09 kg/m as calculated from the following:   Height as of this  encounter: 6\' 2"  (1.88 m).   Weight as of this encounter: 95.7 kg.    Procedures:  None  Consultations:  GI  Urology  Spoke to ID and Orthopedics  Discharge Exam: BP 116/74 (BP Location: Left Arm)     Pulse 82    Temp 98.1 F (36.7 C) (Oral)    Resp 20    Ht 6\' 2"  (1.88 m)    Wt 95.7 kg    SpO2 97%    BMI 27.09 kg/m   General: NAD  Cardiovascular: S1, S2 present Respiratory: CTAB  Discharge Instructions You were cared for by a hospitalist during your hospital stay. If you have any questions about your discharge medications or the care you received while you were in the hospital after you are discharged, you can call the unit and asked to speak with the hospitalist on call if the hospitalist that took care of you is not available. Once you are discharged, your primary care physician will handle any further medical issues. Please note that NO REFILLS for any discharge medications will be authorized once you are discharged, as it is imperative that you return to your primary care physician (or establish a relationship with a primary care physician if you do not have one) for your aftercare needs so that they can reassess your need for medications and monitor your lab values.   Allergies as of 06/10/2019      Reactions   Pantoprazole Diarrhea   Protonix [pantoprazole Sodium] Diarrhea      Medication List    STOP taking these medications   aspirin 81 MG chewable tablet     TAKE these medications   Accu-Chek Aviva Plus test strip Generic drug: glucose blood AS DIRECTED DX E11.65 THREE TIMES A DAY 90 DAYS   alfuzosin 10 MG 24 hr tablet Commonly known as: UROXATRAL Take 10 mg by mouth daily with breakfast.   amoxicillin-clavulanate 875-125 MG tablet Commonly known as: AUGMENTIN Take 1 tablet by mouth 2 (two) times daily.   B-D ULTRAFINE III SHORT PEN 31G X 8 MM Misc Generic drug: Insulin Pen Needle USE ONCE A DAY WITH TRESIBA FLEXPEN   BOOST GLUCOSE CONTROL PO Take 4 oz by mouth 2 (two) times a day.   cholecalciferol 25 MCG (1000 UT) tablet Commonly known as: VITAMIN D3 Take 4,000 Units by mouth daily.   collagenase ointment Commonly known as: SANTYL Apply 1 application  topically daily.   ergocalciferol 1.25 MG (50000 UT) capsule Commonly known as: VITAMIN D2 Take 50,000 Units by mouth once a week. On mondays   escitalopram 5 MG tablet Commonly known as: LEXAPRO Take 5 mg by mouth daily.   ezetimibe 10 MG tablet Commonly known as: ZETIA Take 10 mg by mouth daily.   finasteride 5 MG tablet Commonly known as: PROSCAR Take 5 mg by mouth daily.   furosemide 40 MG tablet Commonly known as: LASIX Take 1 tablet (40 mg total) by mouth 2 (two) times daily.   HumaLOG KwikPen 100 UNIT/ML KwikPen Generic drug: insulin lispro Inject 3 Units into the skin 3 (three) times daily.   insulin glargine 100 UNIT/ML injection Commonly known as: LANTUS Inject 0.3 mLs (30 Units total) into the skin daily.   losartan 25 MG tablet Commonly known as: COZAAR Take 25 mg by mouth daily.   multivitamin with minerals Tabs tablet Take 1 tablet by mouth daily.   sennosides-docusate sodium 8.6-50 MG tablet Commonly known as: SENOKOT-S Take 1 tablet by mouth 2 (two)  times a day.   sodium hypochlorite 0.125 % Soln Commonly known as: DAKIN'S 1/4 STRENGTH Irrigate with as directed 2 (two) times a day.   vitamin B-12 1000 MCG tablet Commonly known as: CYANOCOBALAMIN Take 2,000 mcg by mouth daily.      Allergies  Allergen Reactions   Pantoprazole Diarrhea   Protonix [Pantoprazole Sodium] Diarrhea   Follow-up Information    Stoneking, Hal, MD. Schedule an appointment as soon as possible for a visit in 1 week(s).   Specialty: Internal Medicine Contact information: [address] [place] [address] [phone]        Sueanne Margarita, MD .   Specialty: Cardiology Contact information: [phone] [address] [place] [address] [phone]        Newt Minion, MD. Schedule an appointment as soon as possible for a visit in 1 week(s).   Specialty: Orthopedic Surgery Why: To follow up on osteomyelitis of left heel Contact  information: [place] [address] [phone]        Raynelle Bring, MD. Schedule an appointment as soon as possible for a visit in 1 week(s).   Specialty: Urology Contact information: [place] [address] [phone]            The results of significant diagnostics from this hospitalization (including imaging, microbiology, ancillary and laboratory) are listed below for reference.    Significant Diagnostic Studies: Ct Abdomen Pelvis Wo Contrast  Result Date: 06/07/2019 CLINICAL DATA:  Anemia.  Non pulsatile abdominal mass. EXAM: CT ABDOMEN AND PELVIS WITHOUT CONTRAST TECHNIQUE: Multidetector CT imaging of the abdomen and pelvis was performed following the standard protocol without IV contrast. COMPARISON:  Abdominal ultrasound 06/15/2011 FINDINGS: Lower chest: Moderate bilateral pleural effusions are present. Associated airspace disease likely reflects atelectasis. The heart size is normal. Extensive dense coronary artery calcifications are present. Aortic valve and mitral annular calcifications are present. Hepatobiliary: Liver is unremarkable. Small stones are present at the gallbladder neck. The gallbladder is mildly distended without inflammatory change. The common bile duct is within normal limits into the pancreatic head. Pancreas: Unremarkable. No pancreatic ductal dilatation or surrounding inflammatory changes. Spleen: Normal in size without focal abnormality. Adrenals/Urinary Tract: Adrenal glands are normal bilaterally. Moderate bilateral hydronephrosis is present. A nonobstructing 7 mm stone is present in the midportion of the left kidney. No other stone or mass lesion is present. Ureters are dilated bilaterally into a markedly dilated urinary bladder. Urinary bladder measures 24.8 x 15 x 7 x 16.9 cm. This distends the lower abdomen and is likely the palpable mass. A TURP defect is present in the prostate. Stomach/Bowel: A  moderate-sized hiatal hernia is present. The stomach and duodenum are otherwise within normal limits. Small bowel is unremarkable. Terminal ileum is within normal limits. Appendix is visualized and normal. The ascending and transverse colon are within normal limits. Diverticular changes are present within the descending and sigmoid colon. No inflammatory changes are evident. Vascular/Lymphatic: Atherosclerotic changes are noted in the aorta without aneurysm. IVC filter is in place. Calcifications extend into the arterial branches. No significant adenopathy is present. Reproductive: TURP defect is noted in the prostate. Prostate gland is grossly normal in size. No obstructing mass is evident. Other: No abdominal wall hernia or abnormality. No abdominopelvic ascites. Bilateral soft tissue edema is present laterally in the lower abdomen and pelvis. There is some edema in the flanks bilaterally. Musculoskeletal: A remote inferior L1 compression fracture is noted. There  is no retropulsed bone. Mild rightward curvature is present at L1-2 marked endplate changes are present at L3-4, L4-5 and L5-S1. There is osseous foraminal narrowing bilaterally at these levels. No focal lytic or blastic lesions are present. Degenerative changes are noted in the SI joints. Bony pelvis is otherwise normal. Hips are located and within normal limits. IMPRESSION: 1. Marked distention of the urinary bladder. This represents the palpable mass in the lower abdomen. The bladder measures up to 24.8 cm in long axis. 2. TURP defect in the prostate gland. There is presumably a more distal urinary outlet obstruction. No discrete mass is visualized. 3. Dilated ureters with bilateral moderate hydronephrosis. 4. Nonobstructing 7 mm stone in the midportion of the left kidney. 5.  Aortic Atherosclerosis (ICD10-I70.0).  No aneurysm is present 6. Extensive coronary artery disease with dense calcifications. 7. Dense calcifications associated with the aortic  and mitral valves. 8. Bilateral pleural effusions and associated atelectasis. 9. Multilevel degenerative changes in the lower lumbar spine. Electronically Signed   By: San Morelle M.D.   On: 06/07/2019 09:33   Mr Heel Left Wo Contrast  Result Date: 06/08/2019 CLINICAL DATA:  Osteomyelitis. EXAM: MR OF THE LEFT HEEL WITHOUT CONTRAST TECHNIQUE: Multiplanar, multisequence MR imaging of the ankle was performed. No intravenous contrast was administered. COMPARISON:  Radiographs from 06/07/2019 FINDINGS: Despite efforts by the technologist and patient, motion artifact is present on today's exam and could not be eliminated. This reduces exam sensitivity and specificity. TENDONS Peroneal: Common peroneus tendon sheath tenosynovitis extending peroneus longus tendon sheath. Posteromedial: Mild flexor hallucis longus tenosynovitis just past the knot of [place]. Anterior: Grossly unremarkable Achilles: Grossly unremarkable Plantar Fascia: Thickened medial band suggesting mild plantar fasciitis. LIGAMENTS Lateral: Unremarkable Medial: Grossly unremarkable CARTILAGE Ankle Joint: No osteochondral lesion identified. Moderate degenerative chondral thinning diffusely. Subtalar Joints/Sinus Tarsi: Grossly unremarkable Bones: Osteomyelitis of the posterior calcaneus with scalloped order of the posterior calcaneus, abnormal osseous edema, overlying ulcer, and possible gas in the soft tissues extending towards the bone. Other: Ulceration of the posterior heel. Subcutaneous edema overlying the medial and lateral malleoli. IMPRESSION: 1. Large ulceration along the posterior calcaneus with underlying posterior calcaneal osteomyelitis and erosion. 2. peroneus and flexor hallucis longus tenosynovitis. 3. Mild medial band plantar fasciitis. 4. Despite efforts by the technologist and patient, motion artifact is present on today's exam and could not be eliminated. This reduces exam sensitivity and specificity. Electronically Signed    By: Van Clines M.D.   On: 06/08/2019 20:10   Dg Chest Port 1 View  Result Date: 06/07/2019 CLINICAL DATA:  Pleural effusion history of lung cancer EXAM: PORTABLE CHEST 1 VIEW COMPARISON:  03/17/2019, 01/05/2014 FINDINGS: Left lower chest electronic device. Suspected bilateral left greater than right pleural effusion. Bibasilar consolidations. Vascular congestion with diffuse interstitial opacity, likely edema. Both hila appears slightly enlarged. Stable nodularity adjacent to the right fissure. Aortic atherosclerosis. Borderline to mild cardiomegaly. No pneumothorax. IMPRESSION: 1. Suspected left greater than right pleural effusions with new bibasilar consolidations. 2. Borderline to mild cardiomegaly with vascular congestion and diffuse interstitial opacity consistent with edema 3. Bilateral hilar enlargement, suspected to be secondary to engorged central vasculature. Radiographic follow-up is recommended. Stable right mid lung nodule. Electronically Signed   By: Donavan Foil M.D.   On: 06/07/2019 16:24   Dg Foot Complete Left  Result Date: 06/07/2019 CLINICAL DATA:  Bilateral heel ulcers. EXAM: LEFT FOOT - COMPLETE 3+ VIEW COMPARISON:  Radiographs of March 17, 2019. FINDINGS: Probable lytic area is seen involving  the posterior calcaneus most likely due to osteomyelitis. No acute fracture or dislocation is noted. Probable overlying soft tissue ulceration is noted. Degenerative changes are seen involving the metatarsophalangeal joints. IMPRESSION: Probable lytic areas seen involving posterior calcaneus most consistent with osteomyelitis. MRI is recommended for further evaluation. Electronically Signed   By: Marijo Conception M.D.   On: 06/07/2019 15:18   Dg Foot Complete Right  Result Date: 06/07/2019 CLINICAL DATA:  Bilateral heel ulcers. EXAM: RIGHT FOOT COMPLETE - 3+ VIEW COMPARISON:  Radiographs of September 26, 2015. FINDINGS: There is no evidence of fracture or dislocation. There is no  evidence of arthropathy or other focal bone abnormality. There is no definite evidence of lytic destruction. Soft tissues are unremarkable. IMPRESSION: No definite lytic destruction is seen to suggest osteomyelitis. No definite bony abnormality is noted. Electronically Signed   By: Marijo Conception M.D.   On: 06/07/2019 15:20    Microbiology: Recent Results (from the past 240 hour(s))  SARS Coronavirus 2 (CEPHEID - Performed in [place] hospital lab), Hosp Order     Status: None   Collection Time: 06/07/19  1:36 AM   Specimen: Nasopharyngeal Swab  Result Value Ref Range Status   SARS Coronavirus 2 NEGATIVE NEGATIVE Final    Comment: (NOTE) If result is NEGATIVE SARS-CoV-2 target nucleic acids are NOT DETECTED. The SARS-CoV-2 RNA is generally detectable in upper and lower  respiratory specimens during the acute phase of infection. The lowest  concentration of SARS-CoV-2 viral copies this assay can detect is 250  copies / mL. A negative result does not preclude SARS-CoV-2 infection  and should not be used as the sole basis for treatment or other  patient management decisions.  A negative result may occur with  improper specimen collection / handling, submission of specimen other  than nasopharyngeal swab, presence of viral mutation(s) within the  areas targeted by this assay, and inadequate number of viral copies  (<250 copies / mL). A negative result must be combined with clinical  observations, patient history, and epidemiological information. If result is POSITIVE SARS-CoV-2 target nucleic acids are DETECTED. The SARS-CoV-2 RNA is generally detectable in upper and lower  respiratory specimens dur ing the acute phase of infection.  Positive  results are indicative of active infection with SARS-CoV-2.  Clinical  correlation with patient history and other diagnostic information is  necessary to determine patient infection status.  Positive results do  not rule out bacterial infection  or co-infection with other viruses. If result is PRESUMPTIVE POSTIVE SARS-CoV-2 nucleic acids MAY BE PRESENT.   A presumptive positive result was obtained on the submitted specimen  and confirmed on repeat testing.  While 2019 novel coronavirus  (SARS-CoV-2) nucleic acids may be present in the submitted sample  additional confirmatory testing may be necessary for epidemiological  and / or clinical management purposes  to differentiate between  SARS-CoV-2 and other Sarbecovirus currently known to infect humans.  If clinically indicated additional testing with an alternate test  methodology [phone]) is advised. The SARS-CoV-2 RNA is generally  detectable in upper and lower respiratory sp ecimens during the acute  phase of infection. The expected result is Negative. Fact Sheet for Patients:  StrictlyIdeas.no Fact Sheet for Healthcare Providers: BankingDealers.co.za This test is not yet approved or cleared by the [place] FDA and has been authorized for detection and/or diagnosis of SARS-CoV-2 by FDA under an Emergency Use Authorization (EUA).  This EUA will remain in effect (meaning this test can be  used) for the duration of the COVID-19 declaration under Section 564(b)(1) of the Act, 21 U.S.C. section 360bbb-3(b)(1), unless the authorization is terminated or revoked sooner. Performed at [facility], [place] [address], [place], [place] [address]      Labs: Basic Metabolic Panel: Recent Labs  Lab 06/07/19 0142 06/08/19 0842 06/09/19 0616 06/10/19 0448  NA 134* 135 137 138  K 3.2* 3.0* 3.2* 3.4*  CL 95* 98 99 100  CO2 28 28 29 30   GLUCOSE 188* 170* 212* 197*  BUN 25* 18 15 12   CREATININE 1.08 0.81 0.77 0.70  CALCIUM 8.4* 7.9* 8.2* 8.2*  MG  --   --  1.8  --    Liver Function Tests: Recent Labs  Lab 06/07/19 0142  AST 23  ALT 12  ALKPHOS 68  BILITOT 1.9*  PROT 6.1*  ALBUMIN 2.6*   No results for  input(s): LIPASE, AMYLASE in the last 168 hours. No results for input(s): AMMONIA in the last 168 hours. CBC: Recent Labs  Lab 06/07/19 0142 06/07/19 1833 06/08/19 0842 06/09/19 0616 06/10/19 0448  WBC 4.2  --  3.4* 4.1 3.4*  NEUTROABS  --   --  2.1 2.6 1.7  HGB 7.7* 8.4* 8.0* 8.5* 8.4*  HCT 23.0* 25.1* 24.9* 25.4* 25.4*  MCV 98.7  --  100.8* 99.6 100.0  PLT 232  --  220 212 210   Cardiac Enzymes: No results for input(s): CKTOTAL, CKMB, CKMBINDEX, TROPONINI in the last 168 hours. BNP: BNP (last 3 results) Recent Labs    03/18/19 0307 06/08/19 0842  BNP 46.8 699.9*    ProBNP (last 3 results) No results for input(s): PROBNP in the last 8760 hours.  CBG: Recent Labs  Lab 06/09/19 0743 06/09/19 1151 06/09/19 1638 06/09/19 2129 06/10/19 0748  GLUCAP 198* 226* 211* 263* 195*       Signed:  Alma Friendly, MD [facility] 06/10/2019, 10:59 AM

## 2019-06-11 LAB — TYPE AND SCREEN
ABO/RH(D): AB POS
Antibody Screen: NEGATIVE
Unit division: 0
Unit division: 0

## 2019-06-11 LAB — BPAM RBC
Blood Product Expiration Date: 202006242359
Blood Product Expiration Date: 202006242359
ISSUE DATE / TIME: 202006101227
Unit Type and Rh: 6200
Unit Type and Rh: 6200

## 2019-06-12 NOTE — Telephone Encounter (Signed)
Updated Email- [email or web address]

## 2019-06-13 DIAGNOSIS — L89890 Pressure ulcer of other site, unstageable: Secondary | ICD-10-CM | POA: Diagnosis not present

## 2019-06-13 DIAGNOSIS — L89620 Pressure ulcer of left heel, unstageable: Secondary | ICD-10-CM | POA: Diagnosis not present

## 2019-06-13 DIAGNOSIS — L89302 Pressure ulcer of unspecified buttock, stage 2: Secondary | ICD-10-CM | POA: Diagnosis not present

## 2019-06-13 NOTE — Telephone Encounter (Signed)
Link email again to [email or web address] for video visit on June 23 with Janett Billow NP.

## 2019-06-14 ENCOUNTER — Other Ambulatory Visit: Payer: Self-pay | Admitting: *Deleted

## 2019-06-14 DIAGNOSIS — D62 Acute posthemorrhagic anemia: Secondary | ICD-10-CM | POA: Diagnosis not present

## 2019-06-14 DIAGNOSIS — R339 Retention of urine, unspecified: Secondary | ICD-10-CM | POA: Diagnosis not present

## 2019-06-14 DIAGNOSIS — K922 Gastrointestinal hemorrhage, unspecified: Secondary | ICD-10-CM | POA: Diagnosis not present

## 2019-06-14 DIAGNOSIS — M869 Osteomyelitis, unspecified: Secondary | ICD-10-CM | POA: Diagnosis not present

## 2019-06-14 NOTE — Patient Outreach (Signed)
Member discussed in telephonic IDT meeting. Chad Fuller is receiving rehab therapy at [facility] in [place].  Facility reports member will transition back to long term care post skilled stay at [place].   Member assessed for potential [facility] Care Management needs as a benefit of his [employer].  Writer to sign off. No identifiable [facility] Care Management needs at this time.    Marthenia Rolling, MSN-Ed, RN,BSN [place] Acute Care Coordinator [phone]

## 2019-06-16 DIAGNOSIS — N130 Hydronephrosis with ureteropelvic junction obstruction: Secondary | ICD-10-CM | POA: Diagnosis not present

## 2019-06-16 DIAGNOSIS — R338 Other retention of urine: Secondary | ICD-10-CM | POA: Diagnosis not present

## 2019-06-19 ENCOUNTER — Encounter: Payer: Self-pay | Admitting: Orthopedic Surgery

## 2019-06-19 ENCOUNTER — Ambulatory Visit (INDEPENDENT_AMBULATORY_CARE_PROVIDER_SITE_OTHER): Payer: Medicare Other | Admitting: Orthopedic Surgery

## 2019-06-19 ENCOUNTER — Other Ambulatory Visit: Payer: Self-pay

## 2019-06-19 VITALS — Ht 74.0 in | Wt 211.0 lb

## 2019-06-19 DIAGNOSIS — E1142 Type 2 diabetes mellitus with diabetic polyneuropathy: Secondary | ICD-10-CM

## 2019-06-19 DIAGNOSIS — M86172 Other acute osteomyelitis, left ankle and foot: Secondary | ICD-10-CM | POA: Diagnosis not present

## 2019-06-19 DIAGNOSIS — L97421 Non-pressure chronic ulcer of left heel and midfoot limited to breakdown of skin: Secondary | ICD-10-CM

## 2019-06-19 DIAGNOSIS — E440 Moderate protein-calorie malnutrition: Secondary | ICD-10-CM

## 2019-06-19 DIAGNOSIS — I6340 Cerebral infarction due to embolism of unspecified cerebral artery: Secondary | ICD-10-CM | POA: Diagnosis not present

## 2019-06-19 NOTE — Progress Notes (Signed)
Office Visit Note   Patient: Chad Fuller           Date of Birth: 05-10-1926           MRN: [id number] Visit Date: 06/19/2019              Requested by: Lajean Manes, MD [address] [place],  [place] [address] PCP: Lajean Manes, MD  Chief Complaint  Patient presents with  . Left Foot - Pain    NP: left heel osteomyelitis      HPI: Patient is a 85 year old gentleman resident of [facility] with a chronic left heel ulcer odor drainage and necrotic skin.  Patient has had an MRI scan and radiographs.   Assessment & Plan: Visit Diagnoses:  1. Acute osteomyelitis of left calcaneus (HCC)   2. Non-pressure chronic ulcer of left heel and midfoot limited to breakdown of skin ([place])     Plan: Patient's daughter did not show for his appointment.  Patient's only surgical option would be a transtibial amputation.  I doubt patient would be able to heal a transtibial amputation with his peripheral vascular disease, stroke, diabetes, and protein caloric malnutrition, as well as his dementia..  Patient is not septic from the heel infection there is no ascending cellulitis.  There is no purulent drainage.  Patient should continue cleansing with soap and water dry dressing changes and continue with the foam protective boot.  Follow-Up Instructions: Return if symptoms worsen or fail to improve.   Ortho Exam  Patient is alert, oriented, no adenopathy, well-dressed, normal affect, normal respiratory effort. Examination patient is not alert or oriented.  He states that he walks 3 miles a day but in fact ambulates in a wheelchair.  Patient does have history of type 2 diabetes a stroke involving the right lower extremity left leg weakness as well as multiple other medical problems.  Patient does not have a palpable dorsalis pedis or posterior tibial pulse in the left he has a faintly palpable popliteal pulse.  He has a necrotic ulcer over the left heel which is 5 cm in  diameter.  The MRI scan is reviewed which shows extensive destruction of the calcaneus with chronic osteomyelitis.  Patient has uncontrolled type 2 diabetes with moderate protein caloric malnutrition.  Imaging: No results found. No images are attached to the encounter.  Labs: Lab Results  Component Value Date   HGBA1C 9.7 (H) 03/18/2019   HGBA1C 7.9 (H) 11/19/2014   REPTSTATUS 03/19/2019 FINAL 03/17/2019   GRAMSTAIN  08/16/2012    FEW WBC PRESENT,BOTH PMN AND MONONUCLEAR FEW SQUAMOUS EPITHELIAL CELLS PRESENT NO ORGANISMS SEEN   GRAMSTAIN  08/16/2012    FEW WBC PRESENT, PREDOMINANTLY PMN FEW SQUAMOUS EPITHELIAL CELLS PRESENT NO ORGANISMS SEEN   CULT  03/17/2019    NO GROWTH Performed at [facility], [place] [address] [place],  [address]      Lab Results  Component Value Date   ALBUMIN 2.6 (L) 06/07/2019   ALBUMIN 2.8 (L) 03/19/2019   ALBUMIN 3.3 (L) 03/17/2019    Body mass index is 27.09 kg/m.  Orders:  No orders of the defined types were placed in this encounter.  No orders of the defined types were placed in this encounter.    Procedures: No procedures performed  Clinical Data: No additional findings.  ROS:  All other systems negative, except as noted in the HPI. Review of Systems  Objective: Vital Signs: Ht 6\' 2"  (1.88  m)   Wt 210 lb 15.7 oz (95.7 kg)   BMI 27.09 kg/m   Specialty Comments:  No specialty comments available.  PMFS History: Patient Active Problem List   Diagnosis Date Noted  . GI bleeding 06/07/2019  . Anemia 06/07/2019  . Iron deficiency anemia 06/07/2019  . HTN (hypertension) 03/18/2019  . HLD (hyperlipidemia) 03/18/2019  . Pressure injury of skin 03/18/2019  . Acute renal failure ([place])   . Diabetes mellitus without complication ([place]) 01/05/3234  . GERD (gastroesophageal reflux disease) 03/17/2019  . BPH (benign prostatic hyperplasia) 03/17/2019  . Chronic diastolic (congestive) heart failure ([place]) 03/17/2019   . Fall 03/17/2019  . Hypokalemia 03/17/2019  . AKI (acute kidney injury) ([place]) 03/17/2019  . Elevated troponin 03/17/2019  . Rectal bleeding 07/23/2015  . GI bleed 07/23/2015  . Hypotension 07/23/2015  . Left pontine CVA ([place]) 11/20/2014  . Dysphagia, post-stroke 11/20/2014  . Left leg weakness 11/19/2014  . Type 2 diabetes mellitus ([place]) 11/19/2014  . Aortic heart murmur 11/19/2014  . Non-small cell carcinoma of lung ([place]) 11/19/2014  . Recurrent falls 11/19/2014  . Dizziness and giddiness 11/19/2014  . Stroke (cerebrum) ([place]) 11/19/2014  . Right hemiparesis ([place])   . Type II or unspecified type diabetes mellitus with neurological manifestations, not stated as uncontrolled 11/14/2013  . Pain 11/14/2013  . Closed fracture of metatarsal bone(s) 10/10/2013  . ASTHMA 02/23/2009  . CAD (coronary artery disease) 02/05/2009  . NEOPLASM, MALIGNANT, LUNG 02/04/2009  . Diabetes ([place]) 02/04/2009  . PULMONARY EMBOLISM 02/04/2009  . COPD (chronic obstructive pulmonary disease) ([place]) 02/04/2009  . DYSPNEA 02/04/2009   Past Medical History:  Diagnosis Date  . CAD (coronary artery disease)   . Cancer ([place])    lung  . Dementia ([place])   . Diabetes mellitus   . History of blood clots   . Prostate enlargement   . Pulmonary embolus [facility]) july 2005  . Stroke [facility])     Family History  Problem Relation Age of Onset  . Diabetes Mellitus II Sister     Past Surgical History:  Procedure Laterality Date  . COLONOSCOPY N/A 07/24/2015   Procedure: COLONOSCOPY;  Surgeon: Laurence Spates, MD;  Location: [facility] ENDOSCOPY;  Service: Endoscopy;  Laterality: N/A;  . CORONARY STENT PLACEMENT    . ESOPHAGOGASTRODUODENOSCOPY N/A 07/24/2015   Procedure: ESOPHAGOGASTRODUODENOSCOPY (EGD);  Surgeon: Laurence Spates, MD;  Location: [facility] ENDOSCOPY;  Service: Endoscopy;  Laterality: N/A;  bleeding source not in colon; EGD performed to locate source  . FILTERING PROCEDURE     reports filter placed after knee surgery for  blood clots  . I&D EXTREMITY  08/16/2012   Procedure: MINOR IRRIGATION AND DEBRIDEMENT EXTREMITY;  Surgeon: Tennis Must, MD;  Location: [facility];  Service: Orthopedics;  Laterality: Right;  . KNEE SURGERY    . LOOP RECORDER INSERTION N/A 03/20/2019   Procedure: LOOP RECORDER INSERTION;  Surgeon: Evans Lance, MD;  Location: [place] [facility];  Service: Cardiovascular;  Laterality: N/A;  . LUNG REMOVAL, PARTIAL     Social History   Occupational History  . Not on file  Tobacco Use  . Smoking status: Former Smoker    Types: Cigarettes    Quit date: 07/28/1974    Years since quitting: 44.9  . Smokeless tobacco: Never Used  Substance and Sexual Activity  . Alcohol use: No  . Drug use: No  . Sexual activity: Not on file

## 2019-06-20 ENCOUNTER — Ambulatory Visit (INDEPENDENT_AMBULATORY_CARE_PROVIDER_SITE_OTHER): Payer: Medicare Other | Admitting: Adult Health

## 2019-06-20 DIAGNOSIS — E785 Hyperlipidemia, unspecified: Secondary | ICD-10-CM | POA: Diagnosis not present

## 2019-06-20 DIAGNOSIS — E119 Type 2 diabetes mellitus without complications: Secondary | ICD-10-CM

## 2019-06-20 DIAGNOSIS — K625 Hemorrhage of anus and rectum: Secondary | ICD-10-CM | POA: Diagnosis not present

## 2019-06-20 DIAGNOSIS — I639 Cerebral infarction, unspecified: Secondary | ICD-10-CM

## 2019-06-20 DIAGNOSIS — I6350 Cerebral infarction due to unspecified occlusion or stenosis of unspecified cerebral artery: Secondary | ICD-10-CM

## 2019-06-20 DIAGNOSIS — I10 Essential (primary) hypertension: Secondary | ICD-10-CM

## 2019-06-20 DIAGNOSIS — L89890 Pressure ulcer of other site, unstageable: Secondary | ICD-10-CM | POA: Diagnosis not present

## 2019-06-20 DIAGNOSIS — L89302 Pressure ulcer of unspecified buttock, stage 2: Secondary | ICD-10-CM | POA: Diagnosis not present

## 2019-06-20 DIAGNOSIS — L89620 Pressure ulcer of left heel, unstageable: Secondary | ICD-10-CM | POA: Diagnosis not present

## 2019-06-20 NOTE — Progress Notes (Signed)
[facility] [address] [place]. [place] [address] [phone]       VIRTUAL VISIT FOLLOW UP NOTE  Chad. Chad Fuller Date of Birth:  11-21-1926 Medical Record Number:  [id number]   Reason for Referral:  hospital stroke follow up    Virtual Visit via Video Note  I connected with Chad Fuller on 06/21/19 at  9:15 AM EDT by a video enabled telemedicine application located at [facility] and verified that I am speaking with the correct person using two identifiers who was located at [facility] assisted by facility nurse.   Visit scheduled by Katharine Look, RN. She discussed the limitations of evaluation and management by telemedicine and the availability of in person appointments. The patient expressed understanding and agreed to proceed.Please see telephone note for additional scheduling information and consent.    CHIEF COMPLAINT:  Chief Complaint  Patient presents with   Follow-up    Hospital stroke follow-up    HPI: Chad Fuller was initially scheduled today for in office hospital follow-up regarding bilateral embolic patchy multifocal ischemic infarcts secondary to undetermined source on 03/17/2019 but due to COVID-19 safety precautions, visit transition to telemedicine via [email or web address] with patient and facilities consent. History obtained from facility nurse and chart review. Reviewed all radiology images and labs personally.   Chad. Chad Fuller is a 85 y.o. male with history of diabetes, HTN, HLD, prior history of stroke, coronary artery disease, lung cancer, and PE  who presented after a transient episode of left-sided weakness, chronic right facial droop - possibly worse, recent fall and difficulty speaking.Marland Kitchen He did not receive IV t-PA due to late presentation.  Stroke work-up revealed bilateral patchy multifocal ischemic infarcts embolic pattern secondary to unknown source with residual generalized weakness.  Loop recorder placed to  assess for potential atrial fibrillation as stroke etiology.  LDL 61 and A1c 9.7.  Previously on aspirin 81 mg and recommended increasing dosage to aspirin 325 mg daily.  Prior history of stroke in 2015.  HTN stable.  Continuation of Zetia 10 mg daily for HLD management.  Recommended close PCP follow-up for uncontrolled DM.  Other stroke risk factors include advanced age, former tobacco use and CAD.  He was discharged to [facility] in stable condition.  He has been stable from a stroke standpoint with residual generalized weakness but did gain initial improvement after hospital discharge and was transferred to [facility] assisted living facility on 04/12/2019.  He unfortunately started to deteriorate with having difficulty ambulating and requiring more assistance.  Lab work on 05/12/2019 showed Hgb 5.9 and was sent to [facility] ER for further evaluation.  He was found to have a GI bleed which required transfusions and discontinuation of aspirin.  He has not had follow-up with GI at this time.  Recent lab work showed Hgb 9.5.  He was also found to have acute urinary retention/bladder outlet obstruction requiring Foley catheter which is still currently in place.  Facility plans on removing catheter on 06/27/2019 for voiding trial and will follow with urology as scheduled.  Treated for left heel ulcer with osteomyelitis and continues to follow with orthopedics outpatient.  He was discharged back to [facility] rehab in [place] as he was unable to return to [facility] and [facility] due to [place].  He continues to receive therapies but is nonambulatory and needs assistance of sit to stand for transfers.  He is able to feed himself but otherwise is dependent in all  care.  Facility is unsure at this time if he will be able to return to assisted living versus return to long-term care.  Mental status wax/wane but overall stable.  Loop recorder has not shown atrial fibrillation thus far.  No further  concerns.     Stroke: Bilateral patchy multifocal ischemic infarcts - embolic - source unknown.  Resultant generalized weakness  CT head - No CT evidence for acute intracranial abnormality.   MRI head - Patchy multifocal ischemic infarcts involving the bilateral cerebral hemispheres, most prominent of which measures 18 mm involving the right basal ganglia. Multiple remote infarcts.  MRA head and neck- focal moderate stenosis within the left M1 and mid left V2 segment  2D Echo EF > 65%  Loop recorder placed  LDL - 61  HgbA1c - 9.7  VTE prophylaxis - SCDs  aspirin 81 mg daily prior to admission, now on aspirin 325 mg daily  Patient will be counseled to be compliant with his antithrombotic medications  Ongoing aggressive stroke risk factor management  Therapy recommendations: SNF  Disposition:   SNF - [facility]   ROS:   14 system review of systems performed and negative with exception of weakness, ambulation difficulty, memory loss, confusion  PMH:  Past Medical History:  Diagnosis Date   CAD (coronary artery disease)    Cancer ([place])    lung   Dementia ([place])    Diabetes mellitus    History of blood clots    Prostate enlargement    Pulmonary embolus ([place]) july 2005   Stroke [facility])     PSH:  Past Surgical History:  Procedure Laterality Date   COLONOSCOPY N/A 07/24/2015   Procedure: COLONOSCOPY;  Surgeon: Laurence Spates, MD;  Location: [facility] ENDOSCOPY;  Service: Endoscopy;  Laterality: N/A;   CORONARY STENT PLACEMENT     ESOPHAGOGASTRODUODENOSCOPY N/A 07/24/2015   Procedure: ESOPHAGOGASTRODUODENOSCOPY (EGD);  Surgeon: Laurence Spates, MD;  Location: [facility] ENDOSCOPY;  Service: Endoscopy;  Laterality: N/A;  bleeding source not in colon; EGD performed to locate source   FILTERING PROCEDURE     reports filter placed after knee surgery for blood clots   I&D EXTREMITY  08/16/2012   Procedure: MINOR IRRIGATION AND DEBRIDEMENT EXTREMITY;  Surgeon: Tennis Must, MD;   Location: [facility];  Service: Orthopedics;  Laterality: Right;   KNEE SURGERY     LOOP RECORDER INSERTION N/A 03/20/2019   Procedure: LOOP RECORDER INSERTION;  Surgeon: Evans Lance, MD;  Location: [place] [facility];  Service: Cardiovascular;  Laterality: N/A;   LUNG REMOVAL, PARTIAL      Social History:  Social History   Socioeconomic History   Marital status: Widowed    Spouse name: Not on file   Number of children: Not on file   Years of education: Not on file   Highest education level: Not on file  Occupational History   Not on file  Social Needs   Financial resource strain: Not on file   Food insecurity    Worry: Not on file    Inability: Not on file   Transportation needs    Medical: Not on file    Non-medical: Not on file  Tobacco Use   Smoking status: Former Smoker    Types: Cigarettes    Quit date: 07/28/1974    Years since quitting: 44.9   Smokeless tobacco: Never Used  Substance and Sexual Activity   Alcohol use: No   Drug use: No   Sexual activity: Not on file  Lifestyle   Physical activity    Days per week: Not on file    Minutes per session: Not on file   Stress: Not on file  Relationships   Social connections    Talks on phone: Not on file    Gets together: Not on file    Attends religious service: Not on file    Active member of club or organization: Not on file    Attends meetings of clubs or organizations: Not on file    Relationship status: Not on file   Intimate partner violence    Fear of current or ex partner: Not on file    Emotionally abused: Not on file    Physically abused: Not on file    Forced sexual activity: Not on file  Other Topics Concern   Not on file  Social History Narrative   Not on file    Family History:  Family History  Problem Relation Age of Onset   Diabetes Mellitus II Sister     Medications:   Current Outpatient Medications on File Prior to Visit  Medication Sig  Dispense Refill   ACCU-CHEK AVIVA PLUS test strip AS DIRECTED DX E11.65 THREE TIMES A DAY 90 DAYS  3   alfuzosin (UROXATRAL) 10 MG 24 hr tablet Take 10 mg by mouth daily with breakfast.      amoxicillin-clavulanate (AUGMENTIN) 875-125 MG tablet Take 1 tablet by mouth 2 (two) times daily.     B-D ULTRAFINE III SHORT PEN 31G X 8 MM MISC USE ONCE A DAY WITH TRESIBA FLEXPEN  3   cholecalciferol (VITAMIN D3) 25 MCG (1000 UT) tablet Take 4,000 Units by mouth daily.     collagenase (SANTYL) ointment Apply 1 application topically daily.     ergocalciferol (VITAMIN D2) 1.25 MG (50000 UT) capsule Take 50,000 Units by mouth once a week. On mondays     escitalopram (LEXAPRO) 5 MG tablet Take 5 mg by mouth daily.     ezetimibe (ZETIA) 10 MG tablet Take 10 mg by mouth daily.     finasteride (PROSCAR) 5 MG tablet Take 5 mg by mouth daily.      furosemide (LASIX) 40 MG tablet Take 1 tablet (40 mg total) by mouth 2 (two) times daily.     insulin glargine (LANTUS) 100 UNIT/ML injection Inject 0.3 mLs (30 Units total) into the skin daily. 10 mL 11   insulin lispro (HUMALOG KWIKPEN) 100 UNIT/ML KwikPen Inject 3 Units into the skin 3 (three) times daily.     losartan (COZAAR) 25 MG tablet Take 25 mg by mouth daily.     Multiple Vitamin (MULTIVITAMIN WITH MINERALS) TABS tablet Take 1 tablet by mouth daily.     Nutritional Supplements (BOOST GLUCOSE CONTROL PO) Take 4 oz by mouth 2 (two) times a day.     sennosides-docusate sodium (SENOKOT-S) 8.6-50 MG tablet Take 1 tablet by mouth 2 (two) times a day.     sodium hypochlorite (DAKIN'S 1/4 STRENGTH) 0.125 % SOLN Irrigate with as directed 2 (two) times a day.  0   vitamin B-12 (CYANOCOBALAMIN) 1000 MCG tablet Take 2,000 mcg by mouth daily.     No current facility-administered medications on file prior to visit.     Allergies:   Allergies  Allergen Reactions   Pantoprazole Diarrhea   Protonix [Pantoprazole Sodium] Diarrhea     Physical  Exam  General: well developed, well nourished, pleasant elderly Caucasian male, seated, in no evident distress Head: head normocephalic and  atraumatic.    Neurologic Exam Mental Status: Awake and fully alert. Disoriented to time but oriented to place and self. Recent and remote memory diminished. Attention span, concentration and fund of knowledge diminished. Mood and affect appropriate and cooperative with exam.  Cranial Nerves: Extraocular movements full without nystagmus. Hearing diminished bilaterally. Facial sensation intact.  Right lower facial paralysis (chronic).  Shoulder shrug symmetric. Motor: Generalized weakness per drift assessment Sensory.: intact to light touch Coordination: Rapid alternating movements slow in upper extremities. Finger-to-nose performed accurately and heel-to-shin difficulty performing. Gait and Station: Gait assessment deferred as he is nonambulatory Reflexes: UTA   NIHSS  6 Modified Rankin  4-5   Diagnostic Data (Labs, Imaging, Testing)  Ct Head Wo Contrast 03/17/2019 IMPRESSION:  1. No CT evidence for acute intracranial abnormality.  2. Atrophy and mild small vessel ischemic changes of the white matter   Chad Fuller Contrast Chad Fuller Neck W Wo Contrast Chad Brain Wo Contrast 03/18/2019 IMPRESSION:  MRI HEAD  IMPRESSION:  1. Patchy multifocal ischemic infarcts involving the bilateral cerebral hemispheres as above, most prominent of which measures 18 mm involving the right basal ganglia. No associated hemorrhage or mass effect. A central thromboembolic etiology felt to be likely given the various vascular distributions involved.  2. Underlying atrophy with mild chronic microvascular ischemic disease, with a few scatter remote infarcts involving the bilateral basal ganglia, pons, and bilateral cerebellar hemispheres.   MRA HEAD  IMPRESSION:  1. Negative intracranial MRA for large vessel occlusion.  2. Single short-segment moderate to severe  distal left M1 stenosis. No other hemodynamically significant or correctable stenosis identified. 3. 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   MRA NECK  IMPRESSION:  1. Wide patency of the common and internal carotid arteries bilaterally.  2. Single focal moderate stenosis within the mid left V2 segment. Left vertebral arteries otherwise widely patent within the neck. Right vertebral artery dominant.    Transthoracic Echocardiogram  1. The left ventricle has hyperdynamic systolic function, with an ejection fraction of >65%. The cavity size was normal. Left ventricular diastolic Doppler parameters are consistent with impaired relaxation. 2. The right ventricle has normal systolc function. The cavity was normal. There is no increase in right ventricular wall thickness. 3. The mitral valve is degenerative. Mild thickening of the mitral valve leaflet. There is moderate mitral annular calcification present. Mild mitral valve stenosis. 4. Mean transmitral gradient is 51mmHg. 5. Aortic valve regurgitation was not assessed by color flow Doppler. 6. Pulmonic valve regurgitation was not assessed by color flow Doppler. 7. No intracardiac thrombi or masses were visualized.   EKG - SR rate 98 BPM. (See cardiology reading for complete details)   ASSESSMENT: Chad Fuller is a 85 y.o. year old male here with bilateral embolic patchy multifocal ischemic infarcts on 03/17/2019 secondary to undetermined source.  Loop recorder placed to rule out atrial fibrillation.  Vascular risk factors include HTN, HLD, DM, CAD, prior stroke and prior tobacco use.  Residual deficit of generalized weakness initially improved but was complicated by GI bleed leading to anemia    PLAN:  1. Cryptogenic stroke: Continue Zetia 10 mg for secondary stroke prevention.  Continue to monitor loop recorder for atrial fibrillation.  Will need follow-up with GI for clearance of restarting aspirin for  secondary stroke prevention.  Continue to monitor loop recorder for atrial fibrillation.  Maintain strict control of hypertension with blood pressure goal below 130/90, diabetes with hemoglobin A1c goal below 6.5% and cholesterol with  LDL cholesterol (bad cholesterol) goal below 70 mg/dL.  I also advised the patient to eat a healthy diet with plenty of whole grains, cereals, fruits and vegetables, exercise regularly with at least 30 minutes of continuous activity daily and maintain ideal body weight.  Continue participation in physical therapy for ongoing strengthening of lower extremities 2. HTN: Advised to continue current treatment regimen. Advised to continue to monitor at home along with continued follow-up with PCP for management 3. HLD: Advised to continue current treatment regimen along with continued follow-up with PCP for future prescribing and monitoring of lipid panel 4. DMII: Advised to continue to monitor glucose levels at home along with continued follow-up with PCP for management and monitoring 5. Recent GI bleed: Referral to GI for recent GI bleed and possibly restarting aspirin for secondary stroke prevention 6. Cognitive impairment: Has been stable without worsening   Follow up in 3 months or call earlier if needed   Greater than 50% of time during this 30 minute non-face-to-face visit was spent on counseling, explanation of diagnosis of cryptogenic stroke, reviewing risk factor management of HTN, HLD, DM, CAD, planning of further management along with potential future management, and discussion with patient and family answering all questions.    Venancio Poisson, AGNP-BC  [facility] [address] [place], [place] [address]  Phone [phone] Fax [phone] Note: This document was prepared with digital dictation and possible smart phrase technology. Any transcriptional errors that result from this process are unintentional.

## 2019-06-21 ENCOUNTER — Encounter: Payer: Self-pay | Admitting: Adult Health

## 2019-06-22 NOTE — Progress Notes (Signed)
I agree with the above plan 

## 2019-06-27 ENCOUNTER — Encounter: Payer: Medicare Other | Admitting: *Deleted

## 2019-06-27 DIAGNOSIS — L89890 Pressure ulcer of other site, unstageable: Secondary | ICD-10-CM | POA: Diagnosis not present

## 2019-06-27 DIAGNOSIS — L89302 Pressure ulcer of unspecified buttock, stage 2: Secondary | ICD-10-CM | POA: Diagnosis not present

## 2019-06-27 DIAGNOSIS — L89620 Pressure ulcer of left heel, unstageable: Secondary | ICD-10-CM | POA: Diagnosis not present

## 2019-06-30 DIAGNOSIS — Z20828 Contact with and (suspected) exposure to other viral communicable diseases: Secondary | ICD-10-CM | POA: Diagnosis not present

## 2019-06-30 DIAGNOSIS — R0602 Shortness of breath: Secondary | ICD-10-CM | POA: Diagnosis not present

## 2019-06-30 DIAGNOSIS — I5030 Unspecified diastolic (congestive) heart failure: Secondary | ICD-10-CM | POA: Diagnosis not present

## 2019-06-30 DIAGNOSIS — D649 Anemia, unspecified: Secondary | ICD-10-CM | POA: Diagnosis not present

## 2019-07-04 DIAGNOSIS — L89620 Pressure ulcer of left heel, unstageable: Secondary | ICD-10-CM | POA: Diagnosis not present

## 2019-07-04 DIAGNOSIS — L89890 Pressure ulcer of other site, unstageable: Secondary | ICD-10-CM | POA: Diagnosis not present

## 2019-07-04 DIAGNOSIS — L89303 Pressure ulcer of unspecified buttock, stage 3: Secondary | ICD-10-CM | POA: Diagnosis not present

## 2019-07-05 ENCOUNTER — Ambulatory Visit (INDEPENDENT_AMBULATORY_CARE_PROVIDER_SITE_OTHER): Payer: Medicare Other | Admitting: Family

## 2019-07-05 ENCOUNTER — Telehealth: Payer: Self-pay | Admitting: Radiology

## 2019-07-05 ENCOUNTER — Encounter: Payer: Self-pay | Admitting: Family

## 2019-07-05 ENCOUNTER — Other Ambulatory Visit: Payer: Self-pay

## 2019-07-05 VITALS — Ht 74.0 in | Wt 210.0 lb

## 2019-07-05 DIAGNOSIS — I110 Hypertensive heart disease with heart failure: Secondary | ICD-10-CM | POA: Diagnosis present

## 2019-07-05 DIAGNOSIS — E785 Hyperlipidemia, unspecified: Secondary | ICD-10-CM | POA: Diagnosis not present

## 2019-07-05 DIAGNOSIS — L89303 Pressure ulcer of unspecified buttock, stage 3: Secondary | ICD-10-CM | POA: Diagnosis not present

## 2019-07-05 DIAGNOSIS — F329 Major depressive disorder, single episode, unspecified: Secondary | ICD-10-CM | POA: Diagnosis not present

## 2019-07-05 DIAGNOSIS — N400 Enlarged prostate without lower urinary tract symptoms: Secondary | ICD-10-CM | POA: Diagnosis not present

## 2019-07-05 DIAGNOSIS — L89159 Pressure ulcer of sacral region, unspecified stage: Secondary | ICD-10-CM | POA: Diagnosis present

## 2019-07-05 DIAGNOSIS — N138 Other obstructive and reflux uropathy: Secondary | ICD-10-CM | POA: Diagnosis present

## 2019-07-05 DIAGNOSIS — L89629 Pressure ulcer of left heel, unspecified stage: Secondary | ICD-10-CM | POA: Diagnosis not present

## 2019-07-05 DIAGNOSIS — R791 Abnormal coagulation profile: Secondary | ICD-10-CM | POA: Diagnosis not present

## 2019-07-05 DIAGNOSIS — J690 Pneumonitis due to inhalation of food and vomit: Secondary | ICD-10-CM | POA: Diagnosis not present

## 2019-07-05 DIAGNOSIS — E1169 Type 2 diabetes mellitus with other specified complication: Secondary | ICD-10-CM | POA: Diagnosis present

## 2019-07-05 DIAGNOSIS — D539 Nutritional anemia, unspecified: Secondary | ICD-10-CM | POA: Diagnosis present

## 2019-07-05 DIAGNOSIS — E1140 Type 2 diabetes mellitus with diabetic neuropathy, unspecified: Secondary | ICD-10-CM | POA: Diagnosis not present

## 2019-07-05 DIAGNOSIS — R6521 Severe sepsis with septic shock: Secondary | ICD-10-CM | POA: Diagnosis not present

## 2019-07-05 DIAGNOSIS — M86272 Subacute osteomyelitis, left ankle and foot: Secondary | ICD-10-CM | POA: Diagnosis not present

## 2019-07-05 DIAGNOSIS — L89623 Pressure ulcer of left heel, stage 3: Secondary | ICD-10-CM

## 2019-07-05 DIAGNOSIS — Z66 Do not resuscitate: Secondary | ICD-10-CM | POA: Diagnosis present

## 2019-07-05 DIAGNOSIS — Z79899 Other long term (current) drug therapy: Secondary | ICD-10-CM | POA: Diagnosis not present

## 2019-07-05 DIAGNOSIS — D509 Iron deficiency anemia, unspecified: Secondary | ICD-10-CM | POA: Diagnosis not present

## 2019-07-05 DIAGNOSIS — G510 Bell's palsy: Secondary | ICD-10-CM | POA: Diagnosis present

## 2019-07-05 DIAGNOSIS — Z515 Encounter for palliative care: Secondary | ICD-10-CM | POA: Diagnosis not present

## 2019-07-05 DIAGNOSIS — I2510 Atherosclerotic heart disease of native coronary artery without angina pectoris: Secondary | ICD-10-CM | POA: Diagnosis present

## 2019-07-05 DIAGNOSIS — K922 Gastrointestinal hemorrhage, unspecified: Secondary | ICD-10-CM | POA: Diagnosis not present

## 2019-07-05 DIAGNOSIS — N390 Urinary tract infection, site not specified: Secondary | ICD-10-CM | POA: Diagnosis not present

## 2019-07-05 DIAGNOSIS — M869 Osteomyelitis, unspecified: Secondary | ICD-10-CM | POA: Diagnosis present

## 2019-07-05 DIAGNOSIS — L89620 Pressure ulcer of left heel, unstageable: Secondary | ICD-10-CM | POA: Diagnosis present

## 2019-07-05 DIAGNOSIS — I6350 Cerebral infarction due to unspecified occlusion or stenosis of unspecified cerebral artery: Secondary | ICD-10-CM | POA: Diagnosis not present

## 2019-07-05 DIAGNOSIS — J440 Chronic obstructive pulmonary disease with acute lower respiratory infection: Secondary | ICD-10-CM | POA: Diagnosis present

## 2019-07-05 DIAGNOSIS — E46 Unspecified protein-calorie malnutrition: Secondary | ICD-10-CM | POA: Diagnosis not present

## 2019-07-05 DIAGNOSIS — L89890 Pressure ulcer of other site, unstageable: Secondary | ICD-10-CM | POA: Diagnosis not present

## 2019-07-05 DIAGNOSIS — R0689 Other abnormalities of breathing: Secondary | ICD-10-CM | POA: Diagnosis not present

## 2019-07-05 DIAGNOSIS — E7800 Pure hypercholesterolemia, unspecified: Secondary | ICD-10-CM | POA: Diagnosis not present

## 2019-07-05 DIAGNOSIS — F419 Anxiety disorder, unspecified: Secondary | ICD-10-CM | POA: Diagnosis not present

## 2019-07-05 DIAGNOSIS — R0602 Shortness of breath: Secondary | ICD-10-CM | POA: Diagnosis not present

## 2019-07-05 DIAGNOSIS — M5136 Other intervertebral disc degeneration, lumbar region: Secondary | ICD-10-CM | POA: Diagnosis present

## 2019-07-05 DIAGNOSIS — R627 Adult failure to thrive: Secondary | ICD-10-CM | POA: Diagnosis present

## 2019-07-05 DIAGNOSIS — J180 Bronchopneumonia, unspecified organism: Secondary | ICD-10-CM | POA: Diagnosis not present

## 2019-07-05 DIAGNOSIS — R404 Transient alteration of awareness: Secondary | ICD-10-CM | POA: Diagnosis not present

## 2019-07-05 DIAGNOSIS — Z9981 Dependence on supplemental oxygen: Secondary | ICD-10-CM | POA: Diagnosis not present

## 2019-07-05 DIAGNOSIS — K219 Gastro-esophageal reflux disease without esophagitis: Secondary | ICD-10-CM | POA: Diagnosis present

## 2019-07-05 DIAGNOSIS — D649 Anemia, unspecified: Secondary | ICD-10-CM | POA: Diagnosis not present

## 2019-07-05 DIAGNOSIS — J9601 Acute respiratory failure with hypoxia: Secondary | ICD-10-CM | POA: Diagnosis present

## 2019-07-05 DIAGNOSIS — A419 Sepsis, unspecified organism: Secondary | ICD-10-CM | POA: Diagnosis present

## 2019-07-05 DIAGNOSIS — E876 Hypokalemia: Secondary | ICD-10-CM | POA: Diagnosis present

## 2019-07-05 DIAGNOSIS — Z20828 Contact with and (suspected) exposure to other viral communicable diseases: Secondary | ICD-10-CM | POA: Diagnosis not present

## 2019-07-05 DIAGNOSIS — Z794 Long term (current) use of insulin: Secondary | ICD-10-CM | POA: Diagnosis not present

## 2019-07-05 DIAGNOSIS — R52 Pain, unspecified: Secondary | ICD-10-CM | POA: Diagnosis not present

## 2019-07-05 DIAGNOSIS — E119 Type 2 diabetes mellitus without complications: Secondary | ICD-10-CM | POA: Diagnosis present

## 2019-07-05 DIAGNOSIS — J189 Pneumonia, unspecified organism: Secondary | ICD-10-CM | POA: Diagnosis present

## 2019-07-05 DIAGNOSIS — Z1159 Encounter for screening for other viral diseases: Secondary | ICD-10-CM | POA: Diagnosis not present

## 2019-07-05 DIAGNOSIS — I5032 Chronic diastolic (congestive) heart failure: Secondary | ICD-10-CM | POA: Diagnosis present

## 2019-07-05 DIAGNOSIS — J9621 Acute and chronic respiratory failure with hypoxia: Secondary | ICD-10-CM | POA: Diagnosis not present

## 2019-07-05 DIAGNOSIS — R0902 Hypoxemia: Secondary | ICD-10-CM | POA: Diagnosis not present

## 2019-07-05 DIAGNOSIS — R000 Tachycardia, unspecified: Secondary | ICD-10-CM | POA: Diagnosis not present

## 2019-07-05 DIAGNOSIS — J188 Other pneumonia, unspecified organism: Secondary | ICD-10-CM | POA: Diagnosis not present

## 2019-07-05 DIAGNOSIS — R001 Bradycardia, unspecified: Secondary | ICD-10-CM | POA: Diagnosis not present

## 2019-07-05 DIAGNOSIS — I10 Essential (primary) hypertension: Secondary | ICD-10-CM | POA: Diagnosis not present

## 2019-07-05 DIAGNOSIS — F0390 Unspecified dementia without behavioral disturbance: Secondary | ICD-10-CM | POA: Diagnosis present

## 2019-07-05 NOTE — Telephone Encounter (Signed)
Chad Fuller called and sw Chad Fuller. She had advised to follow up in 4 weeks. Advised to call if things change or get worse.

## 2019-07-05 NOTE — Telephone Encounter (Signed)
Jessica with [facility] left voicemail in regards to patient's visit today. She states that the Santyl has already been DC'd to patient's heel and is instead being used for a pressure ulcer on his buttocks. She also wanted to advise patient is already on Flagyl 500mg  tid and IV Rocephin 2g daily due to it being stated that we would call in some antibiotics. Janett Billow would like a return call at [phone] x [phone]

## 2019-07-06 ENCOUNTER — Encounter: Payer: Self-pay | Admitting: Cardiology

## 2019-07-06 ENCOUNTER — Telehealth: Payer: Self-pay | Admitting: Orthopedic Surgery

## 2019-07-06 NOTE — Telephone Encounter (Signed)
I called and lm on vm to advise to call back with specific questions and I can relay this to Dr. [name] and then call back with answers.

## 2019-07-06 NOTE — Telephone Encounter (Signed)
Pt daughter [name] called in wanting to have a call back form dr.duda in regards to some questions and concerns she's having about the last appt the pt had.   [phone]

## 2019-07-07 ENCOUNTER — Other Ambulatory Visit: Payer: Self-pay

## 2019-07-07 ENCOUNTER — Encounter: Payer: Medicare Other | Admitting: Cardiology

## 2019-07-07 NOTE — Progress Notes (Signed)
This encounter was created in error - please disregard.

## 2019-07-10 NOTE — Telephone Encounter (Signed)
Pt daughter called in with specific questions..  Has there been test done recently to determine that the pt does need an amputation of his left foot? When pt left hospital dr.duda told him the infection was in the bone and they are wondering why wasn't it handled then when pt was in the hospital? Pt was seen on 06-19-19 and why wasn't he put on a more aggressive antibiotic then and who determines that?   Aquilla Solian (Pt daughter) [phone]

## 2019-07-10 NOTE — Telephone Encounter (Signed)
Shawn, Can you please call the daughter concerning these questions when you can please, thank you.

## 2019-07-10 NOTE — Telephone Encounter (Signed)
Spoke with the daughter , Aquilla Solian as requested . Per Dr. Jess Barters office note 06/19/2019, plan was for local care of the heel wound with known osteomyelitis given the patient's multiple complex medical issues and recent CVA, and very advanced age, etc. The daughter is in agreement with less aggressive management and avoiding amputation for now. The daughter reports she thinks the patient is now on IV antibiotics, she thinks for the foot and we discussed that this might help decrease drainage and pain, but would not be likely to eradicate the infection of the bone. We discussed that we would be happy to see the patient back earlier than his scheduled appointment in August if the daughter has further questions. The daughter expressed appreciation for the phone call.

## 2019-07-11 ENCOUNTER — Encounter: Payer: Self-pay | Admitting: Family

## 2019-07-11 DIAGNOSIS — L89303 Pressure ulcer of unspecified buttock, stage 3: Secondary | ICD-10-CM | POA: Diagnosis not present

## 2019-07-11 DIAGNOSIS — L89890 Pressure ulcer of other site, unstageable: Secondary | ICD-10-CM | POA: Diagnosis not present

## 2019-07-11 DIAGNOSIS — L89620 Pressure ulcer of left heel, unstageable: Secondary | ICD-10-CM | POA: Diagnosis not present

## 2019-07-11 NOTE — Progress Notes (Signed)
Office Visit Note   Patient: Chad Fuller           Date of Birth: [birth year]           MRN: [id number] Visit Date: 07/05/2019              Requested by: Lajean Manes, MD [address] [place],  [place] [address] PCP: Lajean Manes, MD  Chief Complaint  Patient presents with  . Left Foot - Pain      HPI: The patient is a 85 year old gentleman who is seen today for evaluation of a left heel decubitus ulcer.  His daughter accompanies the visit.  The patient had a history of a stroke earlier this year.  Unfortunately during a prolonged hospitalization developed a decubitus ulcer of his left heel.  At skilled nursing where he is residing he is currently having daily wound care.  According to documentation from the facility he may be having Santyl dressing changes.  He does have a PRAFO that he is wearing when he is in bed.  Is not wearing at this point in the day.  Assessment & Plan: Visit Diagnoses:  1. Pressure injury of left heel, stage 3 ([place])   2. Type 2 diabetes mellitus with diabetic neuropathy, unspecified whether long term insulin use (HCC)   3. Subacute osteomyelitis of left foot ([place])     Plan: Discussed at length with daughter and patient who would like to continue with conservative measures at this time.  Declined surgical intervention.  The fact that definitive treatment would be a below the knee amputation.  Patient to be followed in the office once more in 4 weeks.  In the interim they will continue with dry dressing changes elevation offloading the heel with a PRAFO boot.  Did later discussed this with wound nurse from the facility over the phone.  Follow-Up Instructions: No follow-ups on file.   Ortho Exam  Patient is alert, oriented, no adenopathy, well-dressed, normal affect, normal respiratory effort. On examination of the left lower extremity there is trace edema to the lower extremity as well as a 5 centimeter in diameter decubitus ulcer to  the left heel this is stage III there is fibrinous tissue in the majority of the wound bed there is surrounding maceration.  There is serosanguineous drainage to his dressing.  There is a odor from the moisture.  There is no purulence no eschar.  This does not probe to bone.  There is no ascending cellulitis or surrounding cellulitis  Imaging: No results found. No images are attached to the encounter.  Labs: Lab Results  Component Value Date   HGBA1C 9.7 (H) 03/18/2019   HGBA1C 7.9 (H) 11/19/2014   REPTSTATUS 03/19/2019 FINAL 03/17/2019   GRAMSTAIN  08/16/2012    FEW WBC PRESENT,BOTH PMN AND MONONUCLEAR FEW SQUAMOUS EPITHELIAL CELLS PRESENT NO ORGANISMS SEEN   GRAMSTAIN  08/16/2012    FEW WBC PRESENT, PREDOMINANTLY PMN FEW SQUAMOUS EPITHELIAL CELLS PRESENT NO ORGANISMS SEEN   CULT  03/17/2019    NO GROWTH Performed at [facility], [place] [address] [place], [place] [address]      Lab Results  Component Value Date   ALBUMIN 2.6 (L) 06/07/2019   ALBUMIN 2.8 (L) 03/19/2019   ALBUMIN 3.3 (L) 03/17/2019    Lab Results  Component Value Date   MG 1.8 06/09/2019   MG 1.8 03/18/2019   No results found for: VD25OH  No results found for: PREALBUMIN CBC  EXTENDED Latest Ref Rng & Units 06/10/2019 06/09/2019 06/08/2019  WBC 4.0 - 10.5 K/uL 3.4(L) 4.1 3.4(L)  RBC 4.22 - 5.81 MIL/uL 2.54(L) 2.55(L) 2.47(L)  HGB 13.0 - 17.0 g/dL 8.4(L) 8.5(L) 8.0(L)  HCT 39.0 - 52.0 % 25.4(L) 25.4(L) 24.9(L)  PLT 150 - 400 K/uL 210 212 220  NEUTROABS 1.7 - 7.7 K/uL 1.7 2.6 2.1  LYMPHSABS 0.7 - 4.0 K/uL 1.4 1.1 1.0     Body mass index is 26.96 kg/m.  Orders:  No orders of the defined types were placed in this encounter.  No orders of the defined types were placed in this encounter.    Procedures: No procedures performed  Clinical Data: No additional findings.  ROS:  All other systems negative, except as noted in the HPI. Review of Systems  Constitutional: Negative for chills  and fever.  Skin: Positive for wound. Negative for color change and rash.    Objective: Vital Signs: Ht 6\' 2"  (1.88 m)   Wt 210 lb (95.3 kg)   BMI 26.96 kg/m   Specialty Comments:  No specialty comments available.  PMFS History: Patient Active Problem List   Diagnosis Date Noted  . Osteomyelitis ([place]) 07/05/2019  . GI bleeding 06/07/2019  . Anemia 06/07/2019  . Iron deficiency anemia 06/07/2019  . HTN (hypertension) 03/18/2019  . HLD (hyperlipidemia) 03/18/2019  . Pressure injury of skin 03/18/2019  . Acute renal failure ([place])   . Diabetes mellitus without complication ([place]) 15/17/6160  . GERD (gastroesophageal reflux disease) 03/17/2019  . BPH (benign prostatic hyperplasia) 03/17/2019  . Chronic diastolic (congestive) heart failure ([place]) 03/17/2019  . Fall 03/17/2019  . Hypokalemia 03/17/2019  . AKI (acute kidney injury) ([place]) 03/17/2019  . Elevated troponin 03/17/2019  . Rectal bleeding 07/23/2015  . GI bleed 07/23/2015  . Hypotension 07/23/2015  . Left pontine CVA ([place]) 11/20/2014  . Dysphagia, post-stroke 11/20/2014  . Left leg weakness 11/19/2014  . Type 2 diabetes mellitus ([place]) 11/19/2014  . Aortic heart murmur 11/19/2014  . Non-small cell carcinoma of lung ([place]) 11/19/2014  . Recurrent falls 11/19/2014  . Dizziness and giddiness 11/19/2014  . Stroke (cerebrum) ([place]) 11/19/2014  . Right hemiparesis ([place])   . Type II or unspecified type diabetes mellitus with neurological manifestations, not stated as uncontrolled 11/14/2013  . Pain 11/14/2013  . Closed fracture of metatarsal bone(s) 10/10/2013  . ASTHMA 02/23/2009  . CAD (coronary artery disease) 02/05/2009  . NEOPLASM, MALIGNANT, LUNG 02/04/2009  . Diabetes ([place]) 02/04/2009  . PULMONARY EMBOLISM 02/04/2009  . COPD (chronic obstructive pulmonary disease) ([place]) 02/04/2009  . DYSPNEA 02/04/2009   Past Medical History:  Diagnosis Date  . Bell's palsy 05/2013   LEFT FACE  . CAD (coronary artery disease)    . Cancer ([place])    lung  . DDD (degenerative disc disease), lumbar    SPINE  . Dementia ([place])   . Diabetes mellitus   . Diverticulosis   . GERD (gastroesophageal reflux disease)   . History of blood clots   . Hypercholesterolemia   . Hypertension   . Hyponatremia    FROM HYDROCHLOROTHIAZIDE  . Lumbar foraminal stenosis    RIGHT L3  . Moderate aortic stenosis 05/2018  . Moderate COPD (chronic obstructive pulmonary disease) ([place])    PULMONARY FUNCTION TESTS MAY 2009  . Prostate enlargement   . Pulmonary embolus [facility]) july 2005  . Squamous cell lung cancer ([place]) 08/2004   CT CHEST 07/2002 [facility]  . Stroke ([place])   . Stroke [facility]) 10/2014  LEFT PARACENTRAL PONS RIGHT SIDED WEAKNESS, ATAXIA AND DYSPHAGIA NO RESIDUAL    Family History  Problem Relation Age of Onset  . Diabetes Mellitus II Sister   . Breast cancer Sister   . Heart attack Mother   . CAD Mother   . Throat cancer Father   . Alcoholism Brother   . Cirrhosis Brother   . Diabetes Brother   . CAD Sister   . Breast cancer Sister     Past Surgical History:  Procedure Laterality Date  . COLONOSCOPY N/A 07/24/2015   Procedure: COLONOSCOPY;  Surgeon: Laurence Spates, MD;  Location[facility] ENDOSCOPY;  Service: Endoscopy;  Laterality: N/A;  . CORONARY STENT PLACEMENT    . ESOPHAGOGASTRODUODENOSCOPY N/A 07/24/2015   Procedure: ESOPHAGOGASTRODUODENOSCOPY (EGD);  Surgeon: Laurence Spates, MD;  Location: [facility] ENDOSCOPY;  Service: Endoscopy;  Laterality: N/A;  bleeding source not in colon; EGD performed to locate source  . FILTERING PROCEDURE     reports filter placed after knee surgery for blood clots  . HERNIA REPAIR Bilateral   . I&D EXTREMITY  08/16/2012   Procedure: MINOR IRRIGATION AND DEBRIDEMENT EXTREMITY;  Surgeon: Tennis Must, MD;  Location: [place];  Service: Orthopedics;  Laterality: Right;  . KNEE SURGERY    . LOOP RECORDER INSERTION N/A 03/20/2019   Procedure: LOOP RECORDER INSERTION;  Surgeon: Evans Lance, MD;  Location: [facility] CV LAB;  Service: Cardiovascular;  Laterality: N/A;  . [place]  . LUNG REMOVAL, PARTIAL    . TURP [place]   DR. [place]  . VENA CAVA FILTER PLACEMENT  06/2004   FOR PULMONARY EMBOLUS  . WEDGE RESECTION  09/2004   non-small cell lung cancer Dr. Arlyce Dice   Social History   Occupational History  . Not on file  Tobacco Use  . Smoking status: Former Smoker    Types: Cigarettes    Quit date: 07/28/1974    Years since quitting: 44.9  . Smokeless tobacco: Never Used  Substance and Sexual Activity  . Alcohol use: No  . Drug use: No  . Sexual activity: Not on file

## 2019-07-14 DIAGNOSIS — D649 Anemia, unspecified: Secondary | ICD-10-CM | POA: Diagnosis not present

## 2019-07-14 DIAGNOSIS — J9621 Acute and chronic respiratory failure with hypoxia: Secondary | ICD-10-CM | POA: Diagnosis not present

## 2019-07-14 DIAGNOSIS — M869 Osteomyelitis, unspecified: Secondary | ICD-10-CM | POA: Diagnosis not present

## 2019-07-14 DIAGNOSIS — J189 Pneumonia, unspecified organism: Secondary | ICD-10-CM | POA: Diagnosis not present

## 2019-07-17 ENCOUNTER — Inpatient Hospital Stay (HOSPITAL_COMMUNITY)
Admission: AD | Admit: 2019-07-17 | Discharge: 2019-07-22 | DRG: 871 | Disposition: A | Discharge status: Home / Self Care | Payer: Medicare Other | Source: Other Acute Inpatient Hospital | Attending: Internal Medicine | Admitting: Internal Medicine

## 2019-07-17 DIAGNOSIS — M5136 Other intervertebral disc degeneration, lumbar region: Secondary | ICD-10-CM | POA: Diagnosis present

## 2019-07-17 DIAGNOSIS — D649 Anemia, unspecified: Secondary | ICD-10-CM | POA: Diagnosis present

## 2019-07-17 DIAGNOSIS — A419 Sepsis, unspecified organism: Secondary | ICD-10-CM | POA: Diagnosis present

## 2019-07-17 DIAGNOSIS — F0390 Unspecified dementia without behavioral disturbance: Secondary | ICD-10-CM | POA: Diagnosis present

## 2019-07-17 DIAGNOSIS — Z1159 Encounter for screening for other viral diseases: Secondary | ICD-10-CM | POA: Diagnosis not present

## 2019-07-17 DIAGNOSIS — Z452 Encounter for adjustment and management of vascular access device: Secondary | ICD-10-CM

## 2019-07-17 DIAGNOSIS — Z8673 Personal history of transient ischemic attack (TIA), and cerebral infarction without residual deficits: Secondary | ICD-10-CM

## 2019-07-17 DIAGNOSIS — N390 Urinary tract infection, site not specified: Secondary | ICD-10-CM | POA: Diagnosis not present

## 2019-07-17 DIAGNOSIS — E1169 Type 2 diabetes mellitus with other specified complication: Secondary | ICD-10-CM | POA: Diagnosis present

## 2019-07-17 DIAGNOSIS — I5032 Chronic diastolic (congestive) heart failure: Secondary | ICD-10-CM | POA: Diagnosis not present

## 2019-07-17 DIAGNOSIS — L89629 Pressure ulcer of left heel, unspecified stage: Secondary | ICD-10-CM | POA: Diagnosis not present

## 2019-07-17 DIAGNOSIS — R6521 Severe sepsis with septic shock: Secondary | ICD-10-CM | POA: Diagnosis not present

## 2019-07-17 DIAGNOSIS — R52 Pain, unspecified: Secondary | ICD-10-CM | POA: Diagnosis not present

## 2019-07-17 DIAGNOSIS — Z515 Encounter for palliative care: Secondary | ICD-10-CM | POA: Diagnosis not present

## 2019-07-17 DIAGNOSIS — E876 Hypokalemia: Secondary | ICD-10-CM | POA: Diagnosis present

## 2019-07-17 DIAGNOSIS — M869 Osteomyelitis, unspecified: Secondary | ICD-10-CM | POA: Diagnosis present

## 2019-07-17 DIAGNOSIS — I10 Essential (primary) hypertension: Secondary | ICD-10-CM | POA: Diagnosis present

## 2019-07-17 DIAGNOSIS — Z9981 Dependence on supplemental oxygen: Secondary | ICD-10-CM | POA: Diagnosis not present

## 2019-07-17 DIAGNOSIS — G510 Bell's palsy: Secondary | ICD-10-CM | POA: Diagnosis present

## 2019-07-17 DIAGNOSIS — J188 Other pneumonia, unspecified organism: Secondary | ICD-10-CM | POA: Diagnosis not present

## 2019-07-17 DIAGNOSIS — Z8249 Family history of ischemic heart disease and other diseases of the circulatory system: Secondary | ICD-10-CM

## 2019-07-17 DIAGNOSIS — K219 Gastro-esophageal reflux disease without esophagitis: Secondary | ICD-10-CM | POA: Diagnosis present

## 2019-07-17 DIAGNOSIS — I509 Heart failure, unspecified: Secondary | ICD-10-CM | POA: Diagnosis not present

## 2019-07-17 DIAGNOSIS — Z79899 Other long term (current) drug therapy: Secondary | ICD-10-CM | POA: Diagnosis not present

## 2019-07-17 DIAGNOSIS — D539 Nutritional anemia, unspecified: Secondary | ICD-10-CM | POA: Diagnosis present

## 2019-07-17 DIAGNOSIS — Z86711 Personal history of pulmonary embolism: Secondary | ICD-10-CM

## 2019-07-17 DIAGNOSIS — Z66 Do not resuscitate: Secondary | ICD-10-CM | POA: Diagnosis present

## 2019-07-17 DIAGNOSIS — Z741 Need for assistance with personal care: Secondary | ICD-10-CM | POA: Diagnosis not present

## 2019-07-17 DIAGNOSIS — J690 Pneumonitis due to inhalation of food and vomit: Secondary | ICD-10-CM | POA: Diagnosis not present

## 2019-07-17 DIAGNOSIS — Z6823 Body mass index (BMI) 23.0-23.9, adult: Secondary | ICD-10-CM | POA: Diagnosis not present

## 2019-07-17 DIAGNOSIS — R627 Adult failure to thrive: Secondary | ICD-10-CM | POA: Diagnosis present

## 2019-07-17 DIAGNOSIS — L89159 Pressure ulcer of sacral region, unspecified stage: Secondary | ICD-10-CM | POA: Diagnosis present

## 2019-07-17 DIAGNOSIS — R404 Transient alteration of awareness: Secondary | ICD-10-CM | POA: Diagnosis not present

## 2019-07-17 DIAGNOSIS — L89620 Pressure ulcer of left heel, unstageable: Secondary | ICD-10-CM | POA: Diagnosis present

## 2019-07-17 DIAGNOSIS — J9601 Acute respiratory failure with hypoxia: Secondary | ICD-10-CM | POA: Diagnosis present

## 2019-07-17 DIAGNOSIS — I110 Hypertensive heart disease with heart failure: Secondary | ICD-10-CM | POA: Diagnosis present

## 2019-07-17 DIAGNOSIS — M2550 Pain in unspecified joint: Secondary | ICD-10-CM | POA: Diagnosis not present

## 2019-07-17 DIAGNOSIS — J440 Chronic obstructive pulmonary disease with acute lower respiratory infection: Secondary | ICD-10-CM | POA: Diagnosis present

## 2019-07-17 DIAGNOSIS — Z79891 Long term (current) use of opiate analgesic: Secondary | ICD-10-CM

## 2019-07-17 DIAGNOSIS — E11621 Type 2 diabetes mellitus with foot ulcer: Secondary | ICD-10-CM | POA: Diagnosis not present

## 2019-07-17 DIAGNOSIS — Z794 Long term (current) use of insulin: Secondary | ICD-10-CM

## 2019-07-17 DIAGNOSIS — E7800 Pure hypercholesterolemia, unspecified: Secondary | ICD-10-CM | POA: Diagnosis present

## 2019-07-17 DIAGNOSIS — N401 Enlarged prostate with lower urinary tract symptoms: Secondary | ICD-10-CM | POA: Diagnosis present

## 2019-07-17 DIAGNOSIS — E119 Type 2 diabetes mellitus without complications: Secondary | ICD-10-CM

## 2019-07-17 DIAGNOSIS — N138 Other obstructive and reflux uropathy: Secondary | ICD-10-CM | POA: Diagnosis present

## 2019-07-17 DIAGNOSIS — Z808 Family history of malignant neoplasm of other organs or systems: Secondary | ICD-10-CM

## 2019-07-17 DIAGNOSIS — R000 Tachycardia, unspecified: Secondary | ICD-10-CM | POA: Diagnosis not present

## 2019-07-17 DIAGNOSIS — R791 Abnormal coagulation profile: Secondary | ICD-10-CM | POA: Diagnosis not present

## 2019-07-17 DIAGNOSIS — J189 Pneumonia, unspecified organism: Secondary | ICD-10-CM | POA: Diagnosis present

## 2019-07-17 DIAGNOSIS — M48061 Spinal stenosis, lumbar region without neurogenic claudication: Secondary | ICD-10-CM | POA: Diagnosis present

## 2019-07-17 DIAGNOSIS — I2510 Atherosclerotic heart disease of native coronary artery without angina pectoris: Secondary | ICD-10-CM | POA: Diagnosis present

## 2019-07-17 DIAGNOSIS — R0602 Shortness of breath: Secondary | ICD-10-CM | POA: Diagnosis not present

## 2019-07-17 DIAGNOSIS — M86172 Other acute osteomyelitis, left ankle and foot: Secondary | ICD-10-CM | POA: Diagnosis not present

## 2019-07-17 DIAGNOSIS — D509 Iron deficiency anemia, unspecified: Secondary | ICD-10-CM | POA: Diagnosis not present

## 2019-07-17 DIAGNOSIS — Z85118 Personal history of other malignant neoplasm of bronchus and lung: Secondary | ICD-10-CM

## 2019-07-17 DIAGNOSIS — R0902 Hypoxemia: Secondary | ICD-10-CM | POA: Diagnosis not present

## 2019-07-17 DIAGNOSIS — J180 Bronchopneumonia, unspecified organism: Secondary | ICD-10-CM | POA: Diagnosis not present

## 2019-07-17 DIAGNOSIS — I69391 Dysphagia following cerebral infarction: Secondary | ICD-10-CM | POA: Diagnosis not present

## 2019-07-17 DIAGNOSIS — J449 Chronic obstructive pulmonary disease, unspecified: Secondary | ICD-10-CM | POA: Diagnosis present

## 2019-07-17 DIAGNOSIS — L97529 Non-pressure chronic ulcer of other part of left foot with unspecified severity: Secondary | ICD-10-CM | POA: Diagnosis not present

## 2019-07-17 DIAGNOSIS — R402431 Glasgow coma scale score 3-8, in the field [EMT or ambulance]: Secondary | ICD-10-CM | POA: Diagnosis not present

## 2019-07-17 DIAGNOSIS — I69318 Other symptoms and signs involving cognitive functions following cerebral infarction: Secondary | ICD-10-CM | POA: Diagnosis not present

## 2019-07-17 DIAGNOSIS — Z7401 Bed confinement status: Secondary | ICD-10-CM | POA: Diagnosis not present

## 2019-07-17 DIAGNOSIS — R001 Bradycardia, unspecified: Secondary | ICD-10-CM | POA: Diagnosis not present

## 2019-07-17 DIAGNOSIS — F0150 Vascular dementia without behavioral disturbance: Secondary | ICD-10-CM | POA: Diagnosis not present

## 2019-07-17 DIAGNOSIS — I959 Hypotension, unspecified: Secondary | ICD-10-CM | POA: Diagnosis present

## 2019-07-17 DIAGNOSIS — R0689 Other abnormalities of breathing: Secondary | ICD-10-CM | POA: Diagnosis not present

## 2019-07-18 ENCOUNTER — Inpatient Hospital Stay (HOSPITAL_COMMUNITY): Payer: Medicare Other

## 2019-07-18 ENCOUNTER — Other Ambulatory Visit: Payer: Self-pay

## 2019-07-18 ENCOUNTER — Encounter (HOSPITAL_COMMUNITY): Payer: Self-pay | Admitting: Internal Medicine

## 2019-07-18 DIAGNOSIS — Z515 Encounter for palliative care: Secondary | ICD-10-CM

## 2019-07-18 DIAGNOSIS — A419 Sepsis, unspecified organism: Principal | ICD-10-CM

## 2019-07-18 DIAGNOSIS — Z66 Do not resuscitate: Secondary | ICD-10-CM

## 2019-07-18 DIAGNOSIS — J189 Pneumonia, unspecified organism: Secondary | ICD-10-CM | POA: Diagnosis present

## 2019-07-18 DIAGNOSIS — R627 Adult failure to thrive: Secondary | ICD-10-CM

## 2019-07-18 DIAGNOSIS — E119 Type 2 diabetes mellitus without complications: Secondary | ICD-10-CM

## 2019-07-18 LAB — COMPREHENSIVE METABOLIC PANEL
ALT: 19 U/L (ref 0–44)
AST: 36 U/L (ref 15–41)
Albumin: 2 g/dL — ABNORMAL LOW (ref 3.5–5.0)
Alkaline Phosphatase: 56 U/L (ref 38–126)
Anion gap: 13 (ref 5–15)
BUN: 31 mg/dL — ABNORMAL HIGH (ref 8–23)
CO2: 29 mmol/L (ref 22–32)
Calcium: 9.2 mg/dL (ref 8.9–10.3)
Chloride: 97 mmol/L — ABNORMAL LOW (ref 98–111)
Creatinine, Ser: 0.81 mg/dL (ref 0.61–1.24)
GFR calc Af Amer: >60 mL/min (ref >60)
GFR calc non Af Amer: >60 mL/min (ref >60)
Glucose, Bld: 203 mg/dL — ABNORMAL HIGH (ref 70–99)
Potassium: 3.3 mmol/L — ABNORMAL LOW (ref 3.5–5.1)
Sodium: 139 mmol/L (ref 135–145)
Total Bilirubin: 0.6 mg/dL (ref 0.3–1.2)
Total Protein: 6 g/dL — ABNORMAL LOW (ref 6.5–8.1)

## 2019-07-18 LAB — CBC WITH DIFFERENTIAL/PLATELET
Abs Immature Granulocytes: 0.05 10*3/uL (ref 0.00–0.07)
Basophils Absolute: 0 10*3/uL (ref 0.0–0.1)
Basophils Relative: 0 %
Eosinophils Absolute: 0 10*3/uL (ref 0.0–0.5)
Eosinophils Relative: 0 %
HCT: 23.2 % — ABNORMAL LOW (ref 39.0–52.0)
Hemoglobin: 7.6 g/dL — ABNORMAL LOW (ref 13.0–17.0)
Immature Granulocytes: 1 %
Lymphocytes Relative: 22 %
Lymphs Abs: 1.1 10*3/uL (ref 0.7–4.0)
MCH: 34.5 pg — ABNORMAL HIGH (ref 26.0–34.0)
MCHC: 32.8 g/dL (ref 30.0–36.0)
MCV: 105.5 fL — ABNORMAL HIGH (ref 80.0–100.0)
Monocytes Absolute: 0.2 10*3/uL (ref 0.1–1.0)
Monocytes Relative: 4 %
Neutro Abs: 3.7 10*3/uL (ref 1.7–7.7)
Neutrophils Relative %: 73 %
Platelets: 189 10*3/uL (ref 150–400)
RBC: 2.2 MIL/uL — ABNORMAL LOW (ref 4.22–5.81)
RDW: 15.9 % — ABNORMAL HIGH (ref 11.5–15.5)
WBC: 5 10*3/uL (ref 4.0–10.5)
nRBC: 0 % (ref 0.0–0.2)

## 2019-07-18 LAB — CBC
HCT: 28.8 % — ABNORMAL LOW (ref 39.0–52.0)
Hemoglobin: 9.1 g/dL — ABNORMAL LOW (ref 13.0–17.0)
MCH: 33.3 pg (ref 26.0–34.0)
MCHC: 31.6 g/dL (ref 30.0–36.0)
MCV: 105.5 fL — ABNORMAL HIGH (ref 80.0–100.0)
Platelets: 174 10*3/uL (ref 150–400)
RBC: 2.73 MIL/uL — ABNORMAL LOW (ref 4.22–5.81)
RDW: 15.9 % — ABNORMAL HIGH (ref 11.5–15.5)
WBC: 4.6 10*3/uL (ref 4.0–10.5)
nRBC: 0 % (ref 0.0–0.2)

## 2019-07-18 LAB — BASIC METABOLIC PANEL
Anion gap: 12 (ref 5–15)
BUN: 31 mg/dL — ABNORMAL HIGH (ref 8–23)
CO2: 27 mmol/L (ref 22–32)
Calcium: 9 mg/dL (ref 8.9–10.3)
Chloride: 99 mmol/L (ref 98–111)
Creatinine, Ser: 0.78 mg/dL (ref 0.61–1.24)
GFR calc Af Amer: >60 mL/min (ref >60)
GFR calc non Af Amer: >60 mL/min (ref >60)
Glucose, Bld: 194 mg/dL — ABNORMAL HIGH (ref 70–99)
Potassium: 3.2 mmol/L — ABNORMAL LOW (ref 3.5–5.1)
Sodium: 138 mmol/L (ref 135–145)

## 2019-07-18 LAB — PROCALCITONIN: Procalcitonin: 2.26 ng/mL

## 2019-07-18 LAB — LACTIC ACID, PLASMA
Lactic Acid, Venous: 2.2 mmol/L (ref 0.5–1.9)
Lactic Acid, Venous: 2.1 mmol/L (ref 0.5–1.9)
Lactic Acid, Venous: 1.6 mmol/L (ref 0.5–1.9)

## 2019-07-18 LAB — GLUCOSE, CAPILLARY
Glucose-Capillary: 168 mg/dL — ABNORMAL HIGH (ref 70–99)
Glucose-Capillary: 180 mg/dL — ABNORMAL HIGH (ref 70–99)
Glucose-Capillary: 133 mg/dL — ABNORMAL HIGH (ref 70–99)

## 2019-07-18 LAB — SARS CORONAVIRUS 2 BY RT PCR (HOSPITAL ORDER, PERFORMED IN ~~LOC~~ HOSPITAL LAB): SARS Coronavirus 2: NEGATIVE

## 2019-07-18 LAB — HIV ANTIBODY (ROUTINE TESTING W REFLEX): HIV Screen 4th Generation wRfx: NONREACTIVE

## 2019-07-18 LAB — STREP PNEUMONIAE URINARY ANTIGEN: Strep Pneumo Urinary Antigen: NEGATIVE

## 2019-07-18 LAB — MRSA PCR SCREENING: MRSA by PCR: NEGATIVE

## 2019-07-18 MED ORDER — EZETIMIBE 10 MG PO TABS
10.0000 mg | ORAL_TABLET | Freq: Every day | ORAL | Status: DC
  Administered 2019-07-19 – 2019-07-20 (×2): 10 mg via ORAL
  Filled 2019-07-18 (×3): qty 1

## 2019-07-18 MED ORDER — COLLAGENASE 250 UNIT/GM EX OINT
TOPICAL_OINTMENT | Freq: Every day | CUTANEOUS | Status: DC
  Administered 2019-07-18 – 2019-07-21 (×4): via TOPICAL
  Filled 2019-07-18: qty 30

## 2019-07-18 MED ORDER — VANCOMYCIN HCL 10 G IV SOLR
1750.0000 mg | Freq: Once | INTRAVENOUS | Status: AC
  Administered 2019-07-18: 1750 mg via INTRAVENOUS
  Filled 2019-07-18: qty 1750

## 2019-07-18 MED ORDER — INSULIN ASPART 100 UNIT/ML ~~LOC~~ SOLN
0.0000 [IU] | Freq: Three times a day (TID) | SUBCUTANEOUS | Status: DC
  Administered 2019-07-18: 2 [IU] via SUBCUTANEOUS
  Administered 2019-07-18: 1 [IU] via SUBCUTANEOUS
  Administered 2019-07-19 – 2019-07-20 (×4): 2 [IU] via SUBCUTANEOUS

## 2019-07-18 MED ORDER — RESOURCE THICKENUP CLEAR PO POWD
ORAL | Status: DC | PRN
  Filled 2019-07-18: qty 125

## 2019-07-18 MED ORDER — ADULT MULTIVITAMIN W/MINERALS CH
1.0000 | ORAL_TABLET | Freq: Every day | ORAL | Status: DC
  Administered 2019-07-19 – 2019-07-20 (×2): 1 via ORAL
  Filled 2019-07-18 (×3): qty 1

## 2019-07-18 MED ORDER — ESCITALOPRAM OXALATE 5 MG PO TABS
5.0000 mg | ORAL_TABLET | Freq: Every day | ORAL | Status: DC
  Administered 2019-07-19 – 2019-07-20 (×2): 5 mg via ORAL
  Filled 2019-07-18 (×3): qty 1

## 2019-07-18 MED ORDER — SODIUM CHLORIDE 0.9 % IV BOLUS
250.0000 mL | Freq: Once | INTRAVENOUS | Status: AC
  Administered 2019-07-18: 250 mL via INTRAVENOUS

## 2019-07-18 MED ORDER — ACETAMINOPHEN 650 MG RE SUPP: 650.0000 mg | Freq: Four times a day (QID) | RECTAL | Status: DC | PRN

## 2019-07-18 MED ORDER — SODIUM CHLORIDE 0.9 % IV BOLUS
1000.0000 mL | Freq: Once | INTRAVENOUS | Status: AC
  Administered 2019-07-18: 1000 mL via INTRAVENOUS

## 2019-07-18 MED ORDER — ACETAMINOPHEN 325 MG PO TABS
650.0000 mg | ORAL_TABLET | Freq: Four times a day (QID) | ORAL | Status: DC | PRN
  Administered 2019-07-20: 650 mg via ORAL
  Filled 2019-07-18: qty 2

## 2019-07-18 MED ORDER — POTASSIUM CHLORIDE 10 MEQ/100ML IV SOLN
10.0000 meq | INTRAVENOUS | Status: AC
  Administered 2019-07-18 (×2): 10 meq via INTRAVENOUS
  Filled 2019-07-18 (×2): qty 100

## 2019-07-18 MED ORDER — VITAMIN B-12 1000 MCG PO TABS
2000.0000 ug | ORAL_TABLET | Freq: Every day | ORAL | Status: DC
  Administered 2019-07-19 – 2019-07-20 (×2): 2000 ug via ORAL
  Filled 2019-07-18 (×2): qty 2

## 2019-07-18 MED ORDER — SODIUM CHLORIDE 0.9 % IV SOLN
2.0000 g | Freq: Once | INTRAVENOUS | Status: AC
  Administered 2019-07-18: 2 g via INTRAVENOUS
  Filled 2019-07-18: qty 2

## 2019-07-18 MED ORDER — ORAL CARE MOUTH RINSE
15.0000 mL | Freq: Two times a day (BID) | OROMUCOSAL | Status: DC
  Administered 2019-07-18 – 2019-07-21 (×7): 15 mL via OROMUCOSAL

## 2019-07-18 MED ORDER — SODIUM CHLORIDE 0.9 % IV SOLN
2.0000 g | Freq: Three times a day (TID) | INTRAVENOUS | Status: DC
  Administered 2019-07-18 – 2019-07-20 (×7): 2 g via INTRAVENOUS
  Filled 2019-07-18 (×8): qty 2

## 2019-07-18 MED ORDER — SODIUM CHLORIDE 0.9 % IV SOLN
INTRAVENOUS | Status: DC
  Administered 2019-07-18 – 2019-07-20 (×3): via INTRAVENOUS

## 2019-07-18 MED ORDER — VANCOMYCIN HCL IN DEXTROSE 750-5 MG/150ML-% IV SOLN
750.0000 mg | Freq: Two times a day (BID) | INTRAVENOUS | Status: DC
  Administered 2019-07-18 – 2019-07-20 (×4): 750 mg via INTRAVENOUS
  Filled 2019-07-18 (×6): qty 150

## 2019-07-18 MED ORDER — HEPARIN SODIUM (PORCINE) 5000 UNIT/ML IJ SOLN
5000.0000 [IU] | Freq: Three times a day (TID) | INTRAMUSCULAR | Status: DC
  Administered 2019-07-18 – 2019-07-20 (×8): 5000 [IU] via SUBCUTANEOUS
  Filled 2019-07-18 (×8): qty 1

## 2019-07-18 MED ORDER — ONDANSETRON HCL 4 MG/2ML IJ SOLN
4.0000 mg | Freq: Four times a day (QID) | INTRAMUSCULAR | Status: DC | PRN
  Administered 2019-07-19: 4 mg via INTRAVENOUS
  Filled 2019-07-18: qty 2

## 2019-07-18 MED ORDER — CHLORHEXIDINE GLUCONATE CLOTH 2 % EX PADS
6.0000 | MEDICATED_PAD | Freq: Every day | CUTANEOUS | Status: DC
  Administered 2019-07-18 – 2019-07-20 (×3): 6 via TOPICAL

## 2019-07-18 MED ORDER — ONDANSETRON HCL 4 MG PO TABS: 4.0000 mg | ORAL_TABLET | Freq: Four times a day (QID) | ORAL | Status: DC | PRN

## 2019-07-18 MED ORDER — TRAMADOL HCL 50 MG PO TABS
50.0000 mg | ORAL_TABLET | Freq: Three times a day (TID) | ORAL | Status: DC
  Administered 2019-07-18 – 2019-07-20 (×6): 50 mg via ORAL
  Filled 2019-07-18 (×7): qty 1

## 2019-07-18 MED ORDER — PRO-STAT SUGAR FREE PO LIQD
30.0000 mL | Freq: Three times a day (TID) | ORAL | Status: DC
  Administered 2019-07-19 – 2019-07-20 (×5): 30 mL via ORAL
  Filled 2019-07-18 (×5): qty 30

## 2019-07-18 MED ORDER — INSULIN GLARGINE 100 UNIT/ML ~~LOC~~ SOLN
30.0000 [IU] | Freq: Every day | SUBCUTANEOUS | Status: DC
  Administered 2019-07-18 – 2019-07-20 (×2): 30 [IU] via SUBCUTANEOUS
  Filled 2019-07-18 (×3): qty 0.3

## 2019-07-18 NOTE — Progress Notes (Signed)
Initial Nutrition Assessment  RD working remotely.   DOCUMENTATION CODES:   (unable to assess for malnutrition at this time.)  INTERVENTION:  - continue 30 ml prostat TID, each supplement provides 100 kcal and 15 grams protein. - will order Magic Cup TID with meals, each supplement provides 290 kcal and 9 grams of protein. - will order daily multivitamin with minerals.  - recommend SLP evaluation as patient was on Dysphagia 3, nectar-thick liquids in June.   NUTRITION DIAGNOSIS:   Inadequate oral intake related to acute illness, lethargy/confusion as evidenced by meal completion < 25%  GOAL:   Patient will meet greater than or equal to 90% of their needs  MONITOR:   PO intake, Supplement acceptance, Labs, Weight trends, Skin  REASON FOR ASSESSMENT:   Malnutrition Screening Tool  ASSESSMENT:   85 y.o. male with history of dementia, pulmonary embolism, HTN, CHF, and chronic anemia. He was recently admitted last month for possible GI bleed. At that time, patient also was found to have osteomyelitis of the left foot. For this admission, patient presented to the [facility] ED and when he arrived he was found to have a fever of 103 F and was hypotensive. CT angiogram of the chest showed multiple opacities concerning for bilateral PNA.  COVID-19 test was negative. He was started on empiric antibiotics for PNA and family requested transfer to [facility]. Patient is DNR, which was confirmed. He has multiple wounds one in the left heel and one in the sacral area and some around the penile area.  Patient came with indwelling Foley catheter.  No intakes documented since admission. Per RN flow sheet, patient is disoriented x4. Prostat was ordered TID yesterday and per review of order, RN did not feel comfortable giving supplement or PO medications to patient yesterday d/t his level of lethargy.   Per chart review, current weight is 177 lb and weight on 6/13 was 210 lb. Weight appears to have  been stable from 07/28/15-06/10/19. This indicates 33 lb weight loss (15.7% body weight) in 1.5 months; significant for time frame. Will need to monitor weight trends closely.  Per notes: - sepsis - anemia d/t recent GI bleed - osteomyelitis of L heel and sacrum with bone visible   Labs reviewed; CBG: 168 mg/dl today, K: 3.2 mmol/l, BUN: 31 mg/dl. Medications reviewed; sliding scale novolog, 30 units lantus/day, 2000 mcg oral cyanocobalamin/day.      NUTRITION - FOCUSED PHYSICAL EXAM:  unable to complete at this time.  Diet Order:   Diet Order            Diet heart healthy/carb modified Room service appropriate? Yes; Fluid consistency: Thin  Diet effective now              EDUCATION NEEDS:   No education needs have been identified at this time  Skin:  Skin Assessment: Skin Integrity Issues: Skin Integrity Issues:: Other (Comment) Other: pressure injuries to coccyx and wound to penis  Last BM:  7/20  Height:   Ht Readings from Last 1 Encounters:  07/17/19 6\' 2"  (1.88 m)    Weight:   Wt Readings from Last 1 Encounters:  07/18/19 80.2 kg    Ideal Body Weight:  86.4 kg  BMI:  Body mass index is 22.7 kg/m.  Estimated Nutritional Needs:   Kcal:  1610-9604 kcal  Protein:  105-120 grams  Fluid:  >/= 2.2 L/day      Jarome Matin, MS, RD, LDN, [facility] Inpatient Clinical Dietitian Pager # [phone]  After hours/weekend pager # [phone]

## 2019-07-18 NOTE — Consult Note (Signed)
Consultation Note Date: 07/18/2019   Patient Name: Chad Fuller  DOB: 1926/04/07  MRN: [id number]  Age / Sex: 85 y.o., male  PCP: Lajean Manes, MD Referring Physician: Hosie Poisson, MD  Reason for Consultation: Establishing goals of care and Psychosocial/spiritual support  HPI/Patient Profile: 85 y.o. male  admitted on 07/17/2019 with past medical  history of dementia, pulmonary embolism, hypertension, chronic diastolic CHF, chronic anemia who was recently admitted last month for possible GI bleed at that time patient also was found to have osteomyelitis of the left foot.  In skilled nursing facility where he has been living he was found to be hypoxic and transferred to ER at [facility].   In the ER patient was found to have a fever 103 F.    Chest x-ray significant for multiple opacities concerning for bilateral pneumonia.  Started on empiric antibiotics and at family request transfer to [facility].  Currently patient in stepdown ICU: Minimally responsive, hypotensive, high risk for decompensation.  Family face treatment option decisions, advanced directive decisions and anticipatory care needs.    Clinical Assessment and Goals of Care:   This NP Wadie Lessen reviewed medical records, received report from team, assessed the patient and then meet at the patient's bedside and spoke to daughter/ Chad Fuller  to discuss diagnosis, prognosis, GOC, EOL wishes disposition and options.  The daughter that I spoke with understands the seriousness of the current medical, the patient's fragility and high risk for decompensation.  We discussed that anything could happen at any time.  I cleared with nursing staff that to family members can visit at the bedside tomorrow to assist with decision-making.  They will arrive tomorrow at 1 PM.  I will meet family at bedside   I have also scheduled a  family telephone conference call for Thursday afternoon at 2 PM to solidify decisions regarding goals of care.  Concept of Hospice and Palliative Care were discussed  A detailed discussion was had today regarding advanced directives.  Concepts specific to code status, artifical feeding and hydration, continued IV antibiotics and rehospitalization was had.  The difference between a aggressive medical intervention path  and a palliative comfort care path for this patient at this time was had.  Values and goals of care important to patient and family were attempted to be elicited.  MOST form discussed.  Natural trajectory and expectations at EOL were discussed.  Questions and concerns addressed.   Family encouraged to call with questions or concerns.    PMT will continue to support holistically.     Six sibling work as a Energy manager for this pateitnt      SUMMARY OF [place]:  DNR   Continue current medical interventions/treatment plan.  Family is hopeful for improvement.  As mentioned above family will visit tomorrow and continue discussion will be had regarding goals of care and end-of-life wishes.  Palliative Prophylaxis:   Aspiration, Delirium Protocol, Frequent Pain Assessment and Oral Care  Additional Recommendations (Limitations, Scope, Preferences):  Full Scope Treatment  Psycho-social/Spiritual:   Desire for further Chaplaincy support:yes  Additional Recommendations: Grief/Bereavement Support  Prognosis:   Unable to determine   high risk for decompensation and likely limited prognosis.    Discharge Planning: To Be Determined      Primary Diagnoses: Present on Admission: . Sepsis ([place]) . HTN (hypertension) . COPD (chronic obstructive pulmonary disease) ([place]) . Chronic diastolic (congestive) heart failure ([place]) . Anemia . Pneumonia   I have reviewed the medical record, interviewed the patient and  family, and examined the patient. The following aspects are pertinent.  Past Medical History:  Diagnosis Date  . Bell's palsy 05/2013   LEFT FACE  . CAD (coronary artery disease)   . Cancer ([place])    lung  . DDD (degenerative disc disease), lumbar    SPINE  . Dementia ([place])   . Diabetes mellitus   . Diverticulosis   . GERD (gastroesophageal reflux disease)   . History of blood clots   . Hypercholesterolemia   . Hypertension   . Hyponatremia    FROM HYDROCHLOROTHIAZIDE  . Lumbar foraminal stenosis    RIGHT L3  . Moderate aortic stenosis 05/2018  . Moderate COPD (chronic obstructive pulmonary disease) ([place])    PULMONARY FUNCTION TESTS MAY 2009  . Prostate enlargement   . Pulmonary embolus [facility]) july 2005  . Squamous cell lung cancer ([place]) 08/2004   CT CHEST 07/2002 [facility]  . Stroke ([place])   . Stroke ([place]) 10/2014   LEFT PARACENTRAL PONS RIGHT SIDED WEAKNESS, ATAXIA AND DYSPHAGIA NO RESIDUAL   Social History   Socioeconomic History  . Marital status: Widowed    Spouse name: Not on file  . Number of children: Not on file  . Years of education: Not on file  . Highest education level: Not on file  Occupational History  . Not on file  Social Needs  . Financial resource strain: Not on file  . Food insecurity    Worry: Not on file    Inability: Not on file  . Transportation needs    Medical: Not on file    Non-medical: Not on file  Tobacco Use  . Smoking status: Former Smoker    Types: Cigarettes    Quit date: 07/28/1974    Years since quitting: 45.0  . Smokeless tobacco: Never Used  Substance and Sexual Activity  . Alcohol use: No  . Drug use: No  . Sexual activity: Not on file  Lifestyle  . Physical activity    Days per week: Not on file    Minutes per session: Not on file  . Stress: Not on file  Relationships  . Social Herbalist on phone: Not on file    Gets together: Not on file    Attends religious service: Not on file    Active member of  club or organization: Not on file    Attends meetings of clubs or organizations: Not on file    Relationship status: Not on file  Other Topics Concern  . Not on file  Social History Narrative  . Not on file   Family History  Problem Relation Age of Onset  . Diabetes Mellitus II Sister   . Breast cancer Sister   . Heart attack Mother   . CAD Mother   . Throat cancer Father   . Alcoholism Brother   . Cirrhosis Brother   . Diabetes Brother   . CAD Sister   .  Breast cancer Sister    Scheduled Meds: . Chlorhexidine Gluconate Cloth  6 each Topical Daily  . collagenase   Topical Daily  . escitalopram  5 mg Oral Daily  . ezetimibe  10 mg Oral Daily  . feeding supplement (PRO-STAT SUGAR FREE 64)  30 mL Oral TID WC  . heparin  5,000 Units Subcutaneous Q8H  . insulin aspart  0-9 Units Subcutaneous TID WC  . insulin glargine  30 Units Subcutaneous Daily  . mouth rinse  15 mL Mouth Rinse BID  . multivitamin with minerals  1 tablet Oral Daily  . traMADol  50 mg Oral Q8H  . vitamin B-12  2,000 mcg Oral Daily   Continuous Infusions: . sodium chloride 75 mL/hr at 07/18/19 1043  . ceFEPime (MAXIPIME) IV Stopped (07/18/19 0857)  . vancomycin     PRN Meds:.acetaminophen **OR** acetaminophen, ondansetron **OR** ondansetron (ZOFRAN) IV, Resource ThickenUp Clear Medications Prior to Admission:  Prior to Admission medications   Medication Sig Start Date End Date Taking? Authorizing Provider  acetaminophen (TYLENOL) 325 MG tablet Take 975 mg by mouth every 8 (eight) hours as needed for mild pain.   Yes [provider]  Amino Acids-Protein Hydrolys (FEEDING SUPPLEMENT, PRO-STAT SUGAR FREE 64,) LIQD Take 30 mLs by mouth 3 (three) times daily with meals.   Yes [provider]  cefTRIAXone (ROCEPHIN) IVPB Inject 2 g into the vein daily.   Yes [provider]  cholecalciferol (VITAMIN D3) 25 MCG (1000 UT) tablet Take 4,000 Units by mouth daily.   Yes [provider]  ergocalciferol (VITAMIN D2) 1.25 MG (50000 UT) capsule Take 50,000 Units by mouth once a week. On mondays   Yes [provider]  escitalopram (LEXAPRO) 5 MG tablet Take 5 mg by mouth daily.   Yes [provider]  ezetimibe (ZETIA) 10 MG tablet Take 10 mg by mouth daily.   Yes [provider]  finasteride (PROSCAR) 5 MG tablet Take 5 mg by mouth daily.  10/05/18  Yes [provider]  furosemide (LASIX) 40 MG tablet Take 1 tablet (40 mg total) by mouth 2 (two) times daily. 06/10/19  Yes Alma Friendly, MD  insulin glargine (LANTUS) 100 UNIT/ML injection Inject 0.3 mLs (30 Units total) into the skin daily. 03/21/19  Yes Bonnielee Haff, MD  insulin lispro (HUMALOG KWIKPEN) 100 UNIT/ML KwikPen Inject 3 Units into the skin 3 (three) times daily.   Yes [provider]  ipratropium-albuterol (DUONEB) 0.5-2.5 (3) MG/3ML SOLN Take 3 mLs by nebulization 4 (four) times daily. For 10 days (started 07/11/19)   Yes [provider]  losartan (COZAAR) 25 MG tablet Take 25 mg by mouth daily. Hold if SBP < 100   Yes [provider]  Multiple Vitamin (MULTIVITAMIN WITH MINERALS) TABS tablet Take 1 tablet by mouth daily.   Yes [provider]  Nutritional Supplements (BOOST GLUCOSE CONTROL PO) Take 4 oz by mouth 3 (three) times daily.    Yes [provider]  omeprazole (PRILOSEC) 40 MG capsule Take 40 mg by mouth daily.   Yes [provider]  sennosides-docusate sodium (SENOKOT-S) 8.6-50 MG tablet Take 1 tablet by mouth 2 (two) times a day.   Yes [provider]  traMADol (ULTRAM) 50 MG tablet Take 50 mg by mouth every 8 (eight) hours.   Yes [provider]  vitamin B-12 (CYANOCOBALAMIN) 1000 MCG tablet Take 2,000 mcg by mouth daily.   Yes [provider]  [place]  test strip AS DIRECTED DX E11.65 THREE TIMES A DAY 90 DAYS 08/15/18   [provider]  B-D ULTRAFINE III SHORT PEN  31G X 8 MM MISC USE ONCE A DAY WITH TRESIBA FLEXPEN 07/22/18   [provider]  sodium hypochlorite (DAKIN'S 1/4 STRENGTH) 0.125 % SOLN Irrigate with as directed 2 (two) times a day. Patient not taking: Reported on 07/17/2019 06/10/19   Alma Friendly, MD   Allergies  Allergen Reactions  . Pantoprazole Diarrhea  . Protonix [Pantoprazole Sodium] Diarrhea   Review of Systems  Unable to perform ROS: Acuity of condition    Physical Exam Constitutional:      Appearance: He is normal weight. He is ill-appearing.     Interventions: Nasal cannula in place.  Cardiovascular:     Rate and Rhythm: Tachycardia present.  Pulmonary:     Breath sounds: Decreased breath sounds present.  Skin:    General: Skin is warm and dry.     Vital Signs: BP 99/60   Pulse 100   Temp 97.8 F (36.6 C) (Axillary)   Resp (!) 30   Ht 6\' 2"  (1.88 m)   Wt 80.2 kg   SpO2 96%   BMI 22.70 kg/m  Pain Scale: 0-10   Pain Score: Asleep   SpO2: SpO2: 96 % O2 Device:SpO2: 96 % O2 Flow Rate: .O2 Flow Rate (L/min): 10 L/min  IO: Intake/output summary:   Intake/Output Summary (Last 24 hours) at 07/18/2019 1309 Last data filed at 07/18/2019 1030 Gross per 24 hour  Intake 1597.94 ml  Output 251 ml  Net 1346.94 ml    LBM: Last BM Date: 07/18/19 Baseline Weight: Weight: 79.5 kg Most recent weight: Weight: 80.2 kg     Palliative Assessment/Data:     Discussed with Dr Karleen Hampshire  Time In: 1250 Time Out: 1400 Time Total: 70 minutes Greater than 50%  of this time was spent counseling and coordinating care related to the above assessment and plan.  Signed by: Wadie Lessen, NP   Please contact Palliative Medicine Team phone at [phone] for questions and concerns.  For individual provider: See Shea Evans

## 2019-07-18 NOTE — H&P (Signed)
History and Physical    Chad Fuller:[id number] DOB: 05-25-1926 DOA: 07/17/2019  PCP: Lajean Manes, MD  Patient coming from: Skilled nursing facility.  Patient was transferred from [facility].  Chief Complaint: Hypoxia.  HPI: Chad Fuller is a 85 y.o. male with history of dementia, pulmonary embolism, hypertension, chronic diastolic CHF, chronic anemia who was recently admitted last month for possible GI bleed at that time patient also was found to have osteomyelitis of the left foot was found to be hypoxic more than usual at the living facility and was transferred to the ER at [facility].  In the ER patient was found to have a fever 103 F.  Was hypotensive initially started on Levophed eventually tapered off.  CT angiogram of the chest was showing multiple opacity concerning for bilateral pneumonia.  COVID-19 test was negative.  Patient was started on empiric antibiotics for pneumonia and family requested transfer to [facility] hospital.  Patient is a DNR which was confirmed.  Patient blood test showed WBC of 2.8 hemoglobin 10.1 platelets 241 creatinine 1 potassium 3.6 sodium 134 glucose 181.  Albumin was 2.6 influenza markers like CRP and d-dimer were elevated.  ABG showed pH of 7.53 PCO2 43 PO2 58.  At the time of my exam patient is not in distress but is lethargic.  Has multiple wounds one in the left heel and one in the sacral area and some around the penile area.  Patient came with indwelling Foley catheter.  ED Course: Patient is a direct admit.  Review of Systems: As per HPI, rest all negative.   Past Medical History:  Diagnosis Date  . Bell's palsy 05/2013   LEFT FACE  . CAD (coronary artery disease)   . Cancer ([place])    lung  . DDD (degenerative disc disease), lumbar    SPINE  . Dementia ([place])   . Diabetes mellitus   . Diverticulosis   . GERD (gastroesophageal reflux disease)   . History of blood clots   . Hypercholesterolemia   . Hypertension    . Hyponatremia    FROM HYDROCHLOROTHIAZIDE  . Lumbar foraminal stenosis    RIGHT L3  . Moderate aortic stenosis 05/2018  . Moderate COPD (chronic obstructive pulmonary disease) ([place])    PULMONARY FUNCTION TESTS MAY 2009  . Prostate enlargement   . Pulmonary embolus [facility]) july 2005  . Squamous cell lung cancer ([place]) 08/2004   CT CHEST 07/2002 SCARRING  . Stroke ([place])   . Stroke ([place]) 10/2014   LEFT PARACENTRAL PONS RIGHT SIDED WEAKNESS, ATAXIA AND DYSPHAGIA NO RESIDUAL    Past Surgical History:  Procedure Laterality Date  . COLONOSCOPY N/A 07/24/2015   Procedure: COLONOSCOPY;  Surgeon: Laurence Spates, MD;  Location: [facility] ENDOSCOPY;  Service: Endoscopy;  Laterality: N/A;  . CORONARY STENT PLACEMENT    . ESOPHAGOGASTRODUODENOSCOPY N/A 07/24/2015   Procedure: ESOPHAGOGASTRODUODENOSCOPY (EGD);  Surgeon: Laurence Spates, MD;  Location: [facility] ENDOSCOPY;  Service: Endoscopy;  Laterality: N/A;  bleeding source not in colon; EGD performed to locate source  . FILTERING PROCEDURE     reports filter placed after knee surgery for blood clots  . HERNIA REPAIR Bilateral   . I&D EXTREMITY  08/16/2012   Procedure: MINOR IRRIGATION AND DEBRIDEMENT EXTREMITY;  Surgeon: Tennis Must, MD;  Location: [place];  Service: Orthopedics;  Laterality: Right;  . KNEE SURGERY    . LOOP RECORDER INSERTION N/A 03/20/2019   Procedure: LOOP RECORDER INSERTION;  Surgeon: Cristopher Peru  W, MD;  Location: [place] CV LAB;  Service: Cardiovascular;  Laterality: N/A;  . [place]  . LUNG REMOVAL, PARTIAL    . TURP [place]   DR. [place]  . VENA CAVA FILTER PLACEMENT  06/2004   FOR PULMONARY EMBOLUS  . WEDGE RESECTION  09/2004   non-small cell lung cancer Dr. Arlyce Dice     reports that he quit smoking about 45 years ago. His smoking use included cigarettes. He has never used smokeless tobacco. He reports that he does not drink alcohol or use drugs.  Allergies  Allergen Reactions   . Pantoprazole Diarrhea  . Protonix [Pantoprazole Sodium] Diarrhea    Family History  Problem Relation Age of Onset  . Diabetes Mellitus II Sister   . Breast cancer Sister   . Heart attack Mother   . CAD Mother   . Throat cancer Father   . Alcoholism Brother   . Cirrhosis Brother   . Diabetes Brother   . CAD Sister   . Breast cancer Sister     Prior to Admission medications   Medication Sig Start Date End Date Taking? Authorizing Provider  acetaminophen (TYLENOL) 325 MG tablet Take 975 mg by mouth every 8 (eight) hours as needed for mild pain.   Yes [provider]  Amino Acids-Protein Hydrolys (FEEDING SUPPLEMENT, PRO-STAT SUGAR FREE 64,) LIQD Take 30 mLs by mouth 3 (three) times daily with meals.   Yes [provider]  cefTRIAXone (ROCEPHIN) IVPB Inject 2 g into the vein daily.   Yes [provider]  cholecalciferol (VITAMIN D3) 25 MCG (1000 UT) tablet Take 4,000 Units by mouth daily.   Yes [provider]  ergocalciferol (VITAMIN D2) 1.25 MG (50000 UT) capsule Take 50,000 Units by mouth once a week. On mondays   Yes [provider]  escitalopram (LEXAPRO) 5 MG tablet Take 5 mg by mouth daily.   Yes [provider]  ezetimibe (ZETIA) 10 MG tablet Take 10 mg by mouth daily.   Yes [provider]  finasteride (PROSCAR) 5 MG tablet Take 5 mg by mouth daily.  10/05/18  Yes [provider]  furosemide (LASIX) 40 MG tablet Take 1 tablet (40 mg total) by mouth 2 (two) times daily. 06/10/19  Yes Alma Friendly, MD  insulin glargine (LANTUS) 100 UNIT/ML injection Inject 0.3 mLs (30 Units total) into the skin daily. 03/21/19  Yes Bonnielee Haff, MD  insulin lispro (HUMALOG KWIKPEN) 100 UNIT/ML KwikPen Inject 3 Units into the skin 3 (three) times daily.   Yes [provider]  ipratropium-albuterol (DUONEB) 0.5-2.5 (3) MG/3ML SOLN Take 3 mLs by nebulization 4 (four) times daily. For 10 days (started 07/11/19)    Yes [provider]  losartan (COZAAR) 25 MG tablet Take 25 mg by mouth daily. Hold if SBP < 100   Yes [provider]  Multiple Vitamin (MULTIVITAMIN WITH MINERALS) TABS tablet Take 1 tablet by mouth daily.   Yes [provider]  Nutritional Supplements (BOOST GLUCOSE CONTROL PO) Take 4 oz by mouth 3 (three) times daily.    Yes [provider]  omeprazole (PRILOSEC) 40 MG capsule Take 40 mg by mouth daily.   Yes [provider]  sennosides-docusate sodium (SENOKOT-S) 8.6-50 MG tablet Take 1 tablet by mouth 2 (two) times a day.   Yes [provider]  traMADol (ULTRAM) 50 MG tablet Take 50 mg by mouth every 8 (eight) hours.   Yes [provider]  vitamin B-12 (CYANOCOBALAMIN) 1000 MCG tablet Take 2,000 mcg by mouth daily.   Yes [provider]  ACCU-CHEK AVIVA PLUS test strip AS DIRECTED DX E11.65 THREE TIMES A DAY 90 DAYS 08/15/18   [provider]  B-D ULTRAFINE III SHORT PEN 31G X 8 MM MISC USE ONCE A DAY WITH TRESIBA FLEXPEN 07/22/18   [provider]  sodium hypochlorite (DAKIN'S 1/4 STRENGTH) 0.125 % SOLN Irrigate with as directed 2 (two) times a day. Patient not taking: Reported on 07/17/2019 06/10/19   Alma Friendly, MD    Physical Exam: Constitutional: Moderately built and nourished. Vitals:   07/18/19 0100 07/18/19 0200 07/18/19 0300 07/18/19 0319  BP: 127/63 (!) 103/59 136/72   Pulse:  98 (!) 103   Resp: (!) 24 (!) 26 (!) 24   Temp:    (!) 97.2 F (36.2 C)  TempSrc:    Axillary  SpO2:  98% 100%   Weight:      Height:       Eyes: Anicteric no pallor. ENMT: No discharge from the ears eyes nose or mouth. Neck: No mass felt.  No neck rigidity. Respiratory: No rhonchi or crepitations. Cardiovascular: S1-S2 heard. Abdomen: Soft nontender bowel sounds present. Musculoskeletal: Chronic changes. Skin: Left heel sacral and penile ulcers. Neurologic: Lethargic arousable does not follow  commands. Psychiatric: Lethargic.   Labs on Admission: I have personally reviewed following labs and imaging studies  CBC: Recent Labs  Lab 07/18/19 0056  WBC 5.0  NEUTROABS 3.7  HGB 7.6*  HCT 23.2*  MCV 105.5*  PLT 850   Basic Metabolic Panel: Recent Labs  Lab 07/18/19 0056  NA 139  K 3.3*  CL 97*  CO2 29  GLUCOSE 203*  BUN 31*  CREATININE 0.81  CALCIUM 9.2   GFR: Estimated Creatinine Clearance: 68.2 mL/min (by C-G formula based on SCr of 0.81 mg/dL). Liver Function Tests: Recent Labs  Lab 07/18/19 0056  AST 36  ALT 19  ALKPHOS 56  BILITOT 0.6  PROT 6.0*  ALBUMIN 2.0*   No results for input(s): LIPASE, AMYLASE in the last 168 hours. No results for input(s): AMMONIA in the last 168 hours. Coagulation Profile: No results for input(s): INR, PROTIME in the last 168 hours. Cardiac Enzymes: No results for input(s): CKTOTAL, CKMB, CKMBINDEX, TROPONINI in the last 168 hours. BNP (last 3 results) No results for input(s): PROBNP in the last 8760 hours. HbA1C: No results for input(s): HGBA1C in the last 72 hours. CBG: No results for input(s): GLUCAP in the last 168 hours. Lipid Profile: No results for input(s): CHOL, HDL, LDLCALC, TRIG, CHOLHDL, LDLDIRECT in the last 72 hours. Thyroid Function Tests: No results for input(s): TSH, T4TOTAL, FREET4, T3FREE, THYROIDAB in the last 72 hours. Anemia Panel: No results for input(s): VITAMINB12, FOLATE, FERRITIN, TIBC, IRON, RETICCTPCT in the last 72 hours. Urine analysis:    Component Value Date/Time   COLORURINE YELLOW 03/17/2019 2026   APPEARANCEUR CLEAR 03/17/2019 2026   LABSPEC 1.015 03/17/2019 2026   PHURINE 5.0 03/17/2019 2026   GLUCOSEU 150 (A) 03/17/2019 2026   HGBUR SMALL (A) 03/17/2019 2026   BILIRUBINUR NEGATIVE 03/17/2019 2026   KETONESUR NEGATIVE 03/17/2019 2026   PROTEINUR NEGATIVE 03/17/2019 2026   UROBILINOGEN 1.0 11/24/2014 1506   NITRITE NEGATIVE 03/17/2019 2026   LEUKOCYTESUR NEGATIVE  03/17/2019 2026   Sepsis Labs: @LABRCNTIP (procalcitonin:4,lacticidven:4) ) Recent Results (from the past 240 hour(s))  SARS Coronavirus 2 (CEPHEID- Performed in [facility] hospital lab), [facility]  Status: None   Collection Time: 07/18/19 12:56 AM   Specimen: Nasopharyngeal Swab  Result Value Ref Range Status   SARS Coronavirus 2 NEGATIVE NEGATIVE Final    Comment: (NOTE) If result is NEGATIVE SARS-CoV-2 target nucleic acids are NOT DETECTED. The SARS-CoV-2 RNA is generally detectable in upper and lower  respiratory specimens during the acute phase of infection. The lowest  concentration of SARS-CoV-2 viral copies this assay can detect is 250  copies / mL. A negative result does not preclude SARS-CoV-2 infection  and should not be used as the sole basis for treatment or other  patient management decisions.  A negative result may occur with  improper specimen collection / handling, submission of specimen other  than nasopharyngeal swab, presence of viral mutation(s) within the  areas targeted by this assay, and inadequate number of viral copies  (<250 copies / mL). A negative result must be combined with clinical  observations, patient history, and epidemiological information. If result is POSITIVE SARS-CoV-2 target nucleic acids are DETECTED. The SARS-CoV-2 RNA is generally detectable in upper and lower  respiratory specimens dur ing the acute phase of infection.  Positive  results are indicative of active infection with SARS-CoV-2.  Clinical  correlation with patient history and other diagnostic information is  necessary to determine patient infection status.  Positive results do  not rule out bacterial infection or co-infection with other viruses. If result is PRESUMPTIVE POSTIVE SARS-CoV-2 nucleic acids MAY BE PRESENT.   A presumptive positive result was obtained on the submitted specimen  and confirmed on repeat testing.  While 2019 novel coronavirus  (SARS-CoV-2)  nucleic acids may be present in the submitted sample  additional confirmatory testing may be necessary for epidemiological  and / or clinical management purposes  to differentiate between  SARS-CoV-2 and other Sarbecovirus currently known to infect humans.  If clinically indicated additional testing with an alternate test  methodology [phone]) is advised. The SARS-CoV-2 RNA is generally  detectable in upper and lower respiratory sp ecimens during the acute  phase of infection. The expected result is Negative. Fact Sheet for Patients:  StrictlyIdeas.no Fact Sheet for Healthcare Providers: BankingDealers.co.za This test is not yet approved or cleared by the [place] FDA and has been authorized for detection and/or diagnosis of SARS-CoV-2 by FDA under an Emergency Use Authorization (EUA).  This EUA will remain in effect (meaning this test can be used) for the duration of the COVID-19 declaration under Section 564(b)(1) of the Act, 21 U.S.C. section 360bbb-3(b)(1), unless the authorization is terminated or revoked sooner. Performed at [facility], [place] [address], [place], [place] [address]      Radiological Exams on Admission: No results found.    Assessment/Plan Principal Problem:   Sepsis ([place]) Active Problems:   COPD (chronic obstructive pulmonary disease) (HCC)   Diabetes mellitus without complication (HCC)   Chronic diastolic (congestive) heart failure (HCC)   HTN (hypertension)   Anemia   Pneumonia    1. Sepsis with acute respiratory failure secondary to pneumonia for which patient is placed on empiric antibiotics.  Will repeat COVID-19 test.  Follow cultures. 2. Hypertension holding antihypertensives due to low normal blood pressure on presentation was briefly on Levophed at [facility] which was discontinued. 3. Chronic diastolic CHF holding off any diuretics due to low normal blood pressure  at presentation.  Closely observe respiratory status. 4. Diabetes mellitus type 2 on Lantus insulin in the morning with sliding scale coverage.  Discussed with nurse  about holding Lantus if patient does not wake up and eat. 5. Anemia with history of recent GI bleed follow CBC. 6. Osteomyelitis of the left heel and sacrum with bone visible.  Will consult wound team.  On empiric antibiotics for now.  Also has mild pain ulcers around the Foley catheter. 7. Chronic indwelling Foley catheter for obstructive uropathy. 8. Dementia.  Given the acute septic presentation with respiratory failure with multiple comorbidities and likelihood for deterioration patient will need inpatient status.   DVT prophylaxis: Heparin. Code Status: DNR. Family Communication: Try to contact patient's son and daughter unable to reach. Disposition Plan: Back to skilled nursing facility when stable. Consults called: Wound team. Admission status: Inpatient.   Rise Patience MD [facility] Pager [phone].  If 7PM-7AM, please contact night-coverage [email or web address] Password [facility]  07/18/2019, 3:34 AM

## 2019-07-18 NOTE — Progress Notes (Signed)
Called Doctor orders to take out of isolation

## 2019-07-18 NOTE — Progress Notes (Signed)
Pharmacy Antibiotic Note  Chad Fuller is a 85 y.o. male admitted on 07/17/2019 with sepsis.  Pharmacy has been consulted for cefepime and vancomycin dosing.  Plan: Cefepime 2 Gm IV q8h Vancomycin 1750 mg x1 then 750 mg IV q12h for est AUC = 416 Goal AUC = 400-550 F/u scr/cultures/levels  Height: 6\' 2"  (188 cm) Weight: 175 lb 4.3 oz (79.5 kg) IBW/kg (Calculated) : 82.2  Temp (24hrs), Avg:96.8 F (36 C), Min:96.8 F (36 C), Max:96.8 F (36 C)  Recent Labs  Lab 07/18/19 0056  WBC PENDING    CrCl cannot be calculated (Patient's most recent lab result is older than the maximum 21 days allowed.).    Allergies  Allergen Reactions  . Pantoprazole Diarrhea  . Protonix [Pantoprazole Sodium] Diarrhea    Antimicrobials this admission: 7/7-7/19 rocephin >> 10 days 7/21 cefepime >>  7/21 vancomycin >>  Dose adjustments this admission:   Microbiology results:  BCx:   UCx:    Sputum:    MRSA PCR:   Thank you for allowing pharmacy to be a part of this patient's care.  Dorrene German 07/18/2019 1:59 AM

## 2019-07-18 NOTE — Consult Note (Signed)
[place] Nurse wound consult note Reason for Consult: Skin irritation to glans penis, left heel with osteomyelitis (Dr. [name] has referred to this as subacute osteomyelitis), and coccygeal pressure injury (2) Wound type:pressure Pressure Injury POA: Yes Measurement:  Coccygeal Pressure injuries (2) Unstageable Pressure Injury:central is Unstageable aand measures 2cm x 1cm with black wound bed and no exudate. Immediate left of this area is a secondary ulceration measuring 2cm x 1cm. This is a Stage 3 PI and is red with scant exudate. Left Heel  Unstageable Pressure Injury: 6cm x 5cm black eschar with no exudate. Ties on left foot (LGT and 4th digit with discolorations. No drainage. Wound bed: As noted above Drainage (amount, consistency, odor) As noted above Periwound:Dry, intact Dressing procedure/placement/frequency: Patient has been followed by Orthopedics (Dr. Eather Colas) for his left heel Pressure Injury and was seen in the office by his NP (E. Zamera) two weeks ago (on 7/8). Clarification for daughter was provided by Dr. Jess Barters PA S. Rayburn on 7/9. Notes from these encounters indicate that curative care for wound would be amputation and that at this time, the family are interested in pursuing conservative care.  Painting the ulcer with a betadine swabstick and allowing it to dry will provide astringent and antimicrobial properties. Dry dressings have been ordered following the betadine swabstick applications.  A pressure redistribution heel boot is provided (Prevalon) bilaterally. Guidance for local topical care for toes on the left foot is also provided.  If you agree, please let Dr. [name] know if the patient's admission. If he or one of his midlevel providers is available to see this patient, they can provide additional input and guidance for the care of this patient's left heel injury.    In the meantime, I will provide conservative care guidance to address the wound on the coccyx.  The moisture  associated skin damage/medical device related skin injury to the glans penis is being addressed with catheter securement, catheter care and periodic movement of the indwelling catheter and is already improved according to the bedside RN assisting me with this assessment, Lauren. Additionally, we are assisted by Wound Treatment Associate [facility]. Their assistance and insight is appreciated.  We will continue care to the coccyx wounds initiated by the facility using collagenase (Santyl) topped with saline moistened gauze topped with dry gauze and covered with a silicone foam for atraumatic dressing changes. Side to side turning and repositioning is in place; patient is on a mattress replacement with low air loss features because he is currently in the ICU. Upon transfer to another unit, orders have been placed to continue this therapy.  I have communicated with Dr. Karleen Hampshire and she will be placing photos in the record to support wound documentation. Also noted is a pending Palliative Care consultation.  [place] nursing team will not follow, but will remain available to this patient, the nursing and medical teams.  Please re-consult if needed. Thanks, Maudie Flakes, MSN, RN, [place], Arther Abbott  Pager# [phone]

## 2019-07-18 NOTE — Evaluation (Addendum)
Clinical/Bedside Swallow Evaluation Patient Details  Name: Chad Fuller MRN: [id number] Date of Birth: [birth year]  Today's Date: 07/18/2019 Time: SLP Start Time (ACUTE ONLY): 1115 SLP Stop Time (ACUTE ONLY): 1145 SLP Time Calculation (min) (ACUTE ONLY): 30 min  Past Medical History:  Past Medical History:  Diagnosis Date  . Bell's palsy 05/2013   LEFT FACE  . CAD (coronary artery disease)   . Cancer ([place])    lung  . DDD (degenerative disc disease), lumbar    SPINE  . Dementia ([place])   . Diabetes mellitus   . Diverticulosis   . GERD (gastroesophageal reflux disease)   . History of blood clots   . Hypercholesterolemia   . Hypertension   . Hyponatremia    FROM HYDROCHLOROTHIAZIDE  . Lumbar foraminal stenosis    RIGHT L3  . Moderate aortic stenosis 05/2018  . Moderate COPD (chronic obstructive pulmonary disease) ([place])    PULMONARY FUNCTION TESTS MAY 2009  . Prostate enlargement   . Pulmonary embolus [facility] july 2005  . Squamous cell lung cancer ([place]) 08/2004   CT CHEST 07/2002 SCARRING  . Stroke ([place])   . Stroke ([place]) 10/2014   LEFT PARACENTRAL PONS RIGHT SIDED WEAKNESS, ATAXIA AND DYSPHAGIA NO RESIDUAL   Past Surgical History:  Past Surgical History:  Procedure Laterality Date  . COLONOSCOPY N/A 07/24/2015   Procedure: COLONOSCOPY;  Surgeon: Laurence Spates, MD;  Location: [facility] ENDOSCOPY;  Service: Endoscopy;  Laterality: N/A;  . CORONARY STENT PLACEMENT    . ESOPHAGOGASTRODUODENOSCOPY N/A 07/24/2015   Procedure: ESOPHAGOGASTRODUODENOSCOPY (EGD);  Surgeon: Laurence Spates, MD;  Location: [facility] ENDOSCOPY;  Service: Endoscopy;  Laterality: N/A;  bleeding source not in colon; EGD performed to locate source  . FILTERING PROCEDURE     reports filter placed after knee surgery for blood clots  . HERNIA REPAIR Bilateral   . I&D EXTREMITY  08/16/2012   Procedure: MINOR IRRIGATION AND DEBRIDEMENT EXTREMITY;  Surgeon: Tennis Must, MD;  Location: [place];  Service:  Orthopedics;  Laterality: Right;  . KNEE SURGERY    . LOOP RECORDER INSERTION N/A 03/20/2019   Procedure: LOOP RECORDER INSERTION;  Surgeon: Evans Lance, MD;  Location: [place] CV LAB;  Service: Cardiovascular;  Laterality: N/A;  . [place]  . LUNG REMOVAL, PARTIAL    . TURP [place]   DR. Ponderay  . VENA CAVA FILTER PLACEMENT  06/2004   FOR PULMONARY EMBOLUS  . WEDGE RESECTION  09/2004   non-small cell lung cancer Dr. Arlyce Dice   HPI:  85 yo male adm to [facility] from [facility] *he was at [facility] prior to hospitalization* with sepsis from pna.  PMH complex with dementia, 02/2019 CVAs, bilateral basal ganglia but mostly right, bilateral cerebellar and pons hemispheres CVA, dysphagia, pna, osteomyelitis, lung cancer, multiple falls, dependent for all care except feeding self prior to admit, Bell's Palsy, essentially immobile with multiple pressure ulcers.  CXR showed large left lower lobe opacity concerning for pna during this hospital admit.  Swallow evaluation ordered.  Pt also for a palliative consult.   Assessment / Plan / Recommendation Clinical Impression  Pt currently presents with worsening dysphagia indications compared to prior clinical observation in June 2020.  Upon entrance to room, he presented with weak coughing.  Intake of nectar via tsp provided with decreased labial seal and indication of delayed pharyngeal swallow.  Bolus of applesauce swallowed with delay - and delayed cough - concern for gross weakness resulting in  pharyngeal residuals present.  Delayed cough noted after completion of clinical evaluation - but baseline cough present.  Highly suspect pt is having ongoing aspiration due to cva's, continued progressive decline and dysphagia.  His h/o lung cancer, multiple strokes and lack of mobility - significantly increase his aspiration pna risk.  Per RN, pt for palliative meeting - hopeful GOC will help clarify wishes.  Pt has h/o SILENT aspiration  on prior MBS 02/2019 with nectar/thin via cup and was on nectar liquids during prior hospital stay.  At this time, recommend only FEW TSPS of NECTAR liquids with full supervision - for comfort- stop intake if pt coughing.  Advised RN to provide necessary medications with nectar or pureed crushed.  As pt is sleepy - do not anticipate he will consume much po but diet is restricted to ameliorate risk.  SLP will follow up.  Thanks for this consult.  Secure chatted MD re: options and Dr Karleen [place] approved for nectar liquids vs npo.  Per notes from SNF< pt was on dys3/thin diet prior to admit.   Will follow up.   SLP Visit Diagnosis: Dysphagia, oropharyngeal phase (R13.12)    Aspiration Risk  Risk for inadequate nutrition/hydration;Severe aspiration risk    Diet Recommendation Nectar-thick liquid(tsps of nectar thick liquids only)   Liquid Administration via: Spoon Medication Administration: Crushed with puree(or crushed with nectar via tsp) Supervision: Staff to assist with self feeding Compensations: Slow rate;Small sips/bites(stop intake if pt is coughing)    Other  Recommendations Oral Care Recommendations: Oral care QID   Follow up Recommendations        Frequency and Duration min 1 x/week  1 week       Prognosis Prognosis for Safe Diet Advancement: Guarded Barriers to Reach Goals: Severity of deficits;Time post onset;Cognitive deficits;Other (Comment)(premorbid status, deconditioning)      Swallow Study   General Date of Onset: 07/18/19 HPI: 85 yo male adm to [facility] from [facility] *he was at [facility] prior to hospitalization* with sepsis from pna.  PMH complex with dementia, 02/2019 CVAs, bilateral basal ganglia but mostly right, bilateral cerebellar and pons hemispheres CVA, dysphagia, pna, osteomyelitis, lung cancer, multiple falls, dependent for all care except feeding self prior to admit, essentially immobile with multiple pressure ulcers.  CXR showed large left lower lobe opacity  concerning for pna during this hospital admit.  Swallow evaluation ordered.  Pt also for a palliative consult. Diet Prior to this Study: Regular;Thin liquids Temperature Spikes Noted: No Respiratory Status: Nasal cannula(10 liters) History of Recent Intubation: No Behavior/Cognition: Lethargic/Drowsy;Requires cueing Oral Cavity Assessment: Dry Oral Care Completed by SLP: Yes Oral Cavity - Dentition: Edentulous Vision: Impaired for self-feeding Self-Feeding Abilities: Total assist Patient Positioning: Upright in bed Baseline Vocal Quality: Low vocal intensity Volitional Cough: Weak(no volitional cough, reflexive cough noted which was marginally strong but not productive) Volitional Swallow: Unable to elicit    Oral/Motor/Sensory Function Overall Oral Motor/Sensory Function: Moderate impairment Facial ROM: Reduced right;Suspected CN VII (facial) dysfunction Facial Symmetry: Abnormal symmetry right;Suspected CN VII (facial) dysfunction Facial Strength: Suspected CN VII (facial) dysfunction(mild) Facial Sensation: (dnt, pt did not participate) Lingual ROM: Suspected CN XII (hypoglossal) dysfunction Lingual Symmetry: Suspected CN XII (hypoglossal) dysfunction Lingual Strength: Reduced Velum: (pt did not open mouth adequately for examination) Mandible: (dnt)   Ice Chips Ice chips: Not tested   Thin Liquid Thin Liquid: Not tested Other Comments: pt with h/o silent nature of aspiration on MBS 02/2019 and was on nectar liquids during prior admit  Nectar Thick Nectar Thick Liquid: Impaired Presentation: Spoon Oral Phase Impairments: Reduced labial seal;Reduced lingual movement/coordination Pharyngeal Phase Impairments: Suspected delayed Swallow;Cough - Delayed   Honey Thick Honey Thick Liquid: Not tested   Puree Puree: Impaired Presentation: Spoon Oral Phase Impairments: Reduced labial seal;Reduced lingual movement/coordination Oral Phase Functional Implications: Prolonged oral  transit Pharyngeal Phase Impairments: Suspected delayed Swallow;Cough - Delayed   Solid     Solid: Not tested      Macario Golds 07/18/2019,12:07 PM   Luanna Salk, MS [place] Pager [phone] Office [phone]

## 2019-07-18 NOTE — Plan of Care (Signed)

## 2019-07-18 NOTE — Progress Notes (Signed)
Chad Fuller is a 85 y.o. male with history of dementia, pulmonary embolism, hypertension, chronic diastolic CHF, chronic anemia who was recently admitted last month for possible GI bleed at that time patient also was found to have osteomyelitis of the left foot was found to be hypoxic more than usual at the living facility and was transferred to the ER at [facility].  In the ER patient was found to have a fever 103 F. CT angiogram of the chest was showing multiple opacity concerning for bilateral pneumonia.  COVID-19 test was negative.  Patient was started on empiric antibiotics for pneumonia and family requested transfer to [facility] long hospital.    Pt seen and examined.  Pt is on 10 lit of [place] HF oxygen, encephalopathic, hypotensive.    A/p: Sepsis from bilateral pneumonia and osteomyelitis of the left heel on broad spectrum IV antibiotics. Continue the same antibiotics.  Discussed the poor prognosis with the patient's daughter and ordered palliative care consult.  IV fluid resuscitation ordered to keep MAP>65.  Replace potassium for hypokalemia.    Hosie Poisson , MD [phone]

## 2019-07-19 DIAGNOSIS — D509 Iron deficiency anemia, unspecified: Secondary | ICD-10-CM

## 2019-07-19 DIAGNOSIS — I5032 Chronic diastolic (congestive) heart failure: Secondary | ICD-10-CM

## 2019-07-19 LAB — CBC
HCT: 25.1 % — ABNORMAL LOW (ref 39.0–52.0)
Hemoglobin: 7.9 g/dL — ABNORMAL LOW (ref 13.0–17.0)
MCH: 33.3 pg (ref 26.0–34.0)
MCHC: 31.5 g/dL (ref 30.0–36.0)
MCV: 105.9 fL — ABNORMAL HIGH (ref 80.0–100.0)
Platelets: 159 10*3/uL (ref 150–400)
RBC: 2.37 MIL/uL — ABNORMAL LOW (ref 4.22–5.81)
RDW: 16.1 % — ABNORMAL HIGH (ref 11.5–15.5)
WBC: 3.8 10*3/uL — ABNORMAL LOW (ref 4.0–10.5)
nRBC: 0 % (ref 0.0–0.2)

## 2019-07-19 LAB — BASIC METABOLIC PANEL
Anion gap: 10 (ref 5–15)
BUN: 25 mg/dL — ABNORMAL HIGH (ref 8–23)
CO2: 26 mmol/L (ref 22–32)
Calcium: 9.3 mg/dL (ref 8.9–10.3)
Chloride: 107 mmol/L (ref 98–111)
Creatinine, Ser: 0.6 mg/dL — ABNORMAL LOW (ref 0.61–1.24)
GFR calc Af Amer: >60 mL/min (ref >60)
GFR calc non Af Amer: >60 mL/min (ref >60)
Glucose, Bld: 96 mg/dL (ref 70–99)
Potassium: 2.8 mmol/L — ABNORMAL LOW (ref 3.5–5.1)
Sodium: 143 mmol/L (ref 135–145)

## 2019-07-19 LAB — MAGNESIUM: Magnesium: 2.1 mg/dL (ref 1.7–2.4)

## 2019-07-19 LAB — GLUCOSE, CAPILLARY
Glucose-Capillary: 101 mg/dL — ABNORMAL HIGH (ref 70–99)
Glucose-Capillary: 85 mg/dL (ref 70–99)
Glucose-Capillary: 152 mg/dL — ABNORMAL HIGH (ref 70–99)
Glucose-Capillary: 185 mg/dL — ABNORMAL HIGH (ref 70–99)

## 2019-07-19 LAB — PHOSPHORUS: Phosphorus: 1.6 mg/dL — ABNORMAL LOW (ref 2.5–4.6)

## 2019-07-19 MED ORDER — POTASSIUM PHOSPHATE MONOBASIC 500 MG PO TABS
500.0000 mg | ORAL_TABLET | Freq: Three times a day (TID) | ORAL | Status: DC
  Administered 2019-07-19 – 2019-07-20 (×4): 500 mg via ORAL
  Filled 2019-07-19 (×5): qty 1

## 2019-07-19 NOTE — Progress Notes (Addendum)
SLP Cancellation Note  Patient Details Name: HADI DUBIN MRN: [id number] DOB: [birth year]   Cancelled treatment:       Reason Eval/Treat Not Completed: (note pt for palliative meeting today with family and noted to have severe dysphagia yesterday, will follow up)   Macario Golds 07/19/2019, 10:52 AM  Luanna Salk, [place] SLP [place] Pager [phone] Office [phone]

## 2019-07-19 NOTE — Progress Notes (Signed)
Patient ID: Chad Fuller, male   DOB: 04-27-1926, 85 y.o.   MRN: [id number]  This NP visited patient at the bedside as a follow up for scheduled conference call with family (six children) for continued conversation regarding current medical situation.  We discussed diagnosis, prognosis, goals of care and end-of-life wishes, disposition and options.  Created space and opportunity for family to explore their thoughts and feelings regarding current medical situation.  All family members have an understanding of the seriousness of the current medical situation and the limited prognosis.  We discussed the concept of human mortality and the limitations of medical interventions to prolong quality of life when the body begins to fail to thrive.  Comfort and dignity are a priority for this patient at this time and into the future,  according to family.   Family understand prognosis is likely days to weeks and anything can happen at any time.  Plan of care: - DNR/DNI      NO ESCALATION OF CARE - no artifical feeding or hydration now or in the future     - diet as tolelrated with known risk of aspiation - no escalation of care--allow a natural death - continue antibiotics until discharge, then no further antibiotic use -palliative wound care -family request residential hospice for EOL care ( [place]) -symptom management  -prognosis is less than two weeks  Discussed natural trajectory and expectations at EOL  Kam Rahimi (son) is the main contact  [phone]   Questions and concerns addressed   Total time spent on the unit was 60 minutes      Discussed with Dr Marylyn Ishihara  Greater than 50% of the time was spent in counseling and coordination of care  Wadie Lessen NP  Palliative Medicine Team Team Phone # [phone] Pager [phone]

## 2019-07-19 NOTE — Progress Notes (Signed)
Chad Fuller [facility]  PROGRESS NOTE    Chad Fuller  XAJ:[id number] DOB: 06-15-1926 DOA: 07/17/2019 PCP: Lajean Manes, MD   Brief Narrative:   Chad Fuller a 85 y.o.malewithhistory of dementia, pulmonary embolism, hypertension, chronic diastolic CHF, chronic anemia who was recently admitted last month for possible GI bleed at that time patient also was found to have osteomyelitis of the left foot was found to be hypoxic more than usual at the living facility and was transferred to the ER at [facility]. In the ER patient was found to have a fever 103 F. CT angiogram of the chest was showing multiple opacity concerning for bilateral pneumonia. COVID-19 test was negative. Patient was started on empiric antibiotics for pneumonia and family requested transfer to [facility] long hospital.    Assessment & Plan:   Principal Problem:   Sepsis ([place]) Active Problems:   COPD (chronic obstructive pulmonary disease) ([place])   Diabetes mellitus without complication (HCC)   Chronic diastolic (congestive) heart failure (HCC)   HTN (hypertension)   Anemia   Pneumonia   Palliative care by specialist   DNR (do not resuscitate)   Adult failure to thrive   Sepsis with acute respiratory failure secondary to pneumonia     - empiric antibiotics w/ vanc/cefepime.       - COVID-19 test is negative     - MRSA is negative     - Bld Cx pending     - afebrile ON and BPs are improved; will make de-escalation decision as we get more data; will be difficult to de-escalate w/o Cx data from wounds     - PC consulted for goals of care  Hypertension      - holding antihypertensives due to low normal blood pressure on presentation      - was briefly on Levophed at [facility] which was discontinued  Chronic diastolic CHF      - holding off any diuretics due to low normal blood pressure at presentation.       - Closely observe respiratory status.  Diabetes mellitus type 2      - on Lantus insulin in the  morning with sliding scale coverage.  Macrocytic Anemia      - with history of recent GI bleed     - no frank bleed noted  Osteomyelitis of the left heel and sacrum with bone visible.      - WOCN consulted     - vanc/cefepime     - Also has mild pain ulcers around the Foley catheter.  Hypokalemia     - 3.3 at admission     - 2.8 today; replete     - Mg2+ ok  Hypophosphatemia     - K-phos. Monitor.  Chronic indwelling Foley catheter for obstructive uropathy.  Dementia   DVT prophylaxis: SCDs Code Status: DNR   Disposition Plan: TBD   Consultants:   Palliative Care   Antimicrobials:  Derrill Memo    Subjective: "I reckon."   Objective: Vitals:   07/19/19 0400 07/19/19 0500 07/19/19 0600 07/19/19 0700  BP: 130/71  139/65 119/61  Pulse: 91  91 87  Resp: (!) 26  (!) 25 (!) 26  Temp:      TempSrc:      SpO2: 95%  98% 92%  Weight:  81.5 kg    Height:        Intake/Output Summary (Last 24 hours) at 07/19/2019 0803 Last data filed at 07/19/2019 0532 Gross per 24  hour  Intake 2090.73 ml  Output 1400 ml  Net 690.73 ml   Filed Weights   07/17/19 2341 07/18/19 0500 07/19/19 0500  Weight: 79.5 kg 80.2 kg 81.5 kg    Examination:  General exam: 85 y.o. male Appears calm but tired and somewhat confused. Respiratory system: Decreased at bases. left rhonchi noted Cardiovascular system: S1 & S2 heard, RRR. No JVD, murmurs, rubs, gallops or clicks. No pedal edema. Gastrointestinal system: Abdomen is nondistended, soft and nontender. No organomegaly or masses felt. Normal bowel sounds heard. Central nervous system: Alert and oriented. No focal neurological deficits. Extremities: LLE heal bandage CDI, b/l boots in place     Data Reviewed: I have personally reviewed following labs and imaging studies.  CBC: Recent Labs  Lab 07/18/19 0056 07/18/19 0434 07/19/19 0709  WBC 5.0 4.6 3.8*  NEUTROABS 3.7  --   --   HGB 7.6* 9.1* 7.9*  HCT 23.2* 28.8* 25.1*   MCV 105.5* 105.5* 105.9*  PLT 189 174 381   Basic Metabolic Panel: Recent Labs  Lab 07/18/19 0056 07/18/19 0434 07/19/19 0709  NA 139 138 143  K 3.3* 3.2* 2.8*  CL 97* 99 107  CO2 29 27 26   GLUCOSE 203* 194* 96  BUN 31* 31* 25*  CREATININE 0.81 0.78 0.60*  CALCIUM 9.2 9.0 9.3  MG  --   --  2.1  PHOS  --   --  1.6*   GFR: Estimated Creatinine Clearance: 70.7 mL/min (A) (by C-G formula based on SCr of 0.6 mg/dL (L)). Liver Function Tests: Recent Labs  Lab 07/18/19 0056  AST 36  ALT 19  ALKPHOS 56  BILITOT 0.6  PROT 6.0*  ALBUMIN 2.0*   No results for input(s): LIPASE, AMYLASE in the last 168 hours. No results for input(s): AMMONIA in the last 168 hours. Coagulation Profile: No results for input(s): INR, PROTIME in the last 168 hours. Cardiac Enzymes: No results for input(s): CKTOTAL, CKMB, CKMBINDEX, TROPONINI in the last 168 hours. BNP (last 3 results) No results for input(s): PROBNP in the last 8760 hours. HbA1C: No results for input(s): HGBA1C in the last 72 hours. CBG: Recent Labs  Lab 07/18/19 0745 07/18/19 1218 07/18/19 1621 07/19/19 0740  GLUCAP 168* 180* 133* 85   Lipid Profile: No results for input(s): CHOL, HDL, LDLCALC, TRIG, CHOLHDL, LDLDIRECT in the last 72 hours. Thyroid Function Tests: No results for input(s): TSH, T4TOTAL, FREET4, T3FREE, THYROIDAB in the last 72 hours. Anemia Panel: No results for input(s): VITAMINB12, FOLATE, FERRITIN, TIBC, IRON, RETICCTPCT in the last 72 hours. Sepsis Labs: Recent Labs  Lab 07/18/19 0056 07/18/19 0434 07/18/19 1202  PROCALCITON 2.26  --   --   LATICACIDVEN 2.2* 2.1* 1.6    Recent Results (from the past 240 hour(s))  SARS Coronavirus 2 (CEPHEID- Performed in [place] hospital lab), Hosp Order     Status: None   Collection Time: 07/18/19 12:56 AM   Specimen: Nasopharyngeal Swab  Result Value Ref Range Status   SARS Coronavirus 2 NEGATIVE NEGATIVE Final    Comment: (NOTE) If result is  NEGATIVE SARS-CoV-2 target nucleic acids are NOT DETECTED. The SARS-CoV-2 RNA is generally detectable in upper and lower  respiratory specimens during the acute phase of infection. The lowest  concentration of SARS-CoV-2 viral copies this assay can detect is 250  copies / mL. A negative result does not preclude SARS-CoV-2 infection  and should not be used as the sole basis for treatment or other  patient management  decisions.  A negative result may occur with  improper specimen collection / handling, submission of specimen other  than nasopharyngeal swab, presence of viral mutation(s) within the  areas targeted by this assay, and inadequate number of viral copies  (<250 copies / mL). A negative result must be combined with clinical  observations, patient history, and epidemiological information. If result is POSITIVE SARS-CoV-2 target nucleic acids are DETECTED. The SARS-CoV-2 RNA is generally detectable in upper and lower  respiratory specimens dur ing the acute phase of infection.  Positive  results are indicative of active infection with SARS-CoV-2.  Clinical  correlation with patient history and other diagnostic information is  necessary to determine patient infection status.  Positive results do  not rule out bacterial infection or co-infection with other viruses. If result is PRESUMPTIVE POSTIVE SARS-CoV-2 nucleic acids MAY BE PRESENT.   A presumptive positive result was obtained on the submitted specimen  and confirmed on repeat testing.  While 2019 novel coronavirus  (SARS-CoV-2) nucleic acids may be present in the submitted sample  additional confirmatory testing may be necessary for epidemiological  and / or clinical management purposes  to differentiate between  SARS-CoV-2 and other Sarbecovirus currently known to infect humans.  If clinically indicated additional testing with an alternate test  methodology [phone]) is advised. The SARS-CoV-2 RNA is generally  detectable  in upper and lower respiratory sp ecimens during the acute  phase of infection. The expected result is Negative. Fact Sheet for Patients:  StrictlyIdeas.no Fact Sheet for Healthcare Providers: BankingDealers.co.za This test is not yet approved or cleared by the [place] FDA and has been authorized for detection and/or diagnosis of SARS-CoV-2 by FDA under an Emergency Use Authorization (EUA).  This EUA will remain in effect (meaning this test can be used) for the duration of the COVID-19 declaration under Section 564(b)(1) of the Act, 21 U.S.C. section 360bbb-3(b)(1), unless the authorization is terminated or revoked sooner. Performed at [facility], [place] [address] [place], [place] [address]   Culture, blood (routine x 2)     Status: None (Preliminary result)   Collection Time: 07/18/19 12:57 AM   Specimen: BLOOD LEFT HAND  Result Value Ref Range Status   Specimen Description   Final    BLOOD LEFT HAND Performed at [facility], [place] [address], [place], [place] [address]    Special Requests   Final    BOTTLES DRAWN AEROBIC ONLY Blood Culture adequate volume Performed at [place] [address] [place], [place] [address]    Culture PENDING  Incomplete   Report Status PENDING  Incomplete  MRSA PCR Screening     Status: None   Collection Time: 07/18/19 12:57 AM   Specimen: Urine, Catheterized; Nasopharyngeal  Result Value Ref Range Status   MRSA by PCR NEGATIVE NEGATIVE Final    Comment:        The GeneXpert MRSA Assay (FDA approved for NASAL specimens only), is one component of a comprehensive MRSA colonization surveillance program. It is not intended to diagnose MRSA infection nor to guide or monitor treatment for MRSA infections. Performed at [facility], [place] [address] [place],  [address]   Culture, blood (routine x 2)     Status: None  (Preliminary result)   Collection Time: 07/18/19  1:02 AM   Specimen: BLOOD RIGHT HAND  Result Value Ref Range Status   Specimen Description   Final    BLOOD RIGHT HAND Performed  at [facility], [place] [address] [place], [place] [address]    Special Requests   Final    BOTTLES DRAWN AEROBIC ONLY Blood Culture adequate volume Performed at [place] [address] [place], [place] [address]    Culture PENDING  Incomplete   Report Status PENDING  Incomplete         Radiology Studies: Dg Chest 1 View  Result Date: 07/18/2019 CLINICAL DATA:  PICC placement. EXAM: CHEST  1 VIEW COMPARISON:  Radiograph of June 07, 2019 FINDINGS: Stable cardiomediastinal silhouette. Interval placement of left-sided PICC line with distal tip in expected position of the SVC. No pneumothorax is noted. Large left lower lobe opacity is noted concerning for edema. Minimal right upper lobe subsegmental atelectasis is noted. No acute bony abnormality is noted. IMPRESSION: Interval placement of left-sided PICC line with distal tip in expected position of the SVC. Large left lower lobe opacity is now noted concerning for pneumonia. Electronically Signed   By: Marijo Conception M.D.   On: 07/18/2019 10:20        Scheduled Meds: . Chlorhexidine Gluconate Cloth  6 each Topical Daily  . collagenase   Topical Daily  . escitalopram  5 mg Oral Daily  . ezetimibe  10 mg Oral Daily  . feeding supplement (PRO-STAT SUGAR FREE 64)  30 mL Oral TID WC  . heparin  5,000 Units Subcutaneous Q8H  . insulin aspart  0-9 Units Subcutaneous TID WC  . insulin glargine  30 Units Subcutaneous Daily  . mouth rinse  15 mL Mouth Rinse BID  . multivitamin with minerals  1 tablet Oral Daily  . traMADol  50 mg Oral Q8H  . vitamin B-12  2,000 mcg Oral Daily   Continuous Infusions: . sodium chloride 75 mL/hr at 07/19/19 0207  . ceFEPime (MAXIPIME) IV Stopped (07/19/19 0602)  . vancomycin Stopped (07/19/19  0513)     LOS: 2 days    Time spent: 35 minutes spent in the coordination of care today.    Jonnie Finner, DO [facility] Pager [phone]  If 7PM-7AM, please contact night-coverage [email or web address] Password [facility] 07/19/2019, 8:03 AM

## 2019-07-19 NOTE — TOC Initial Note (Signed)
Transition of Care [facility]) - Initial/Assessment Note    Patient Details  Name: Chad Fuller MRN: [id number] Date of Birth: 06-27-1926  Transition of Care [facility]) CM/SW Contact:    Nila Nephew, LCSW Phone Number: [phone] 07/19/2019, 9:33 AM  Clinical Narrative:   Pt is long term care resident at [facility], admitted with pneumonia. Pt has dementia and chronic anemia. Another recent hospital admission for GI bleed and osteomyelitis. PMT involved to discuss goals of care with family. CSW spoke with Olivia Mackie at [place] who is in contact with pt's daughter who informed her family is considering if hospice care is next step. TOC team will follow to assist with disposition.                 Expected Discharge Plan: Skilled Nursing Facility Barriers to Discharge: Continued Medical Work up   Patient Goals and CMS Choice Patient states their goals for this hospitalization and ongoing recovery are:: did not participate pt minimally responsive   Choice offered to / list presented to : NA  Expected Discharge Plan and Services Expected Discharge Plan: [place] In-house Referral: Clinical Social Work Discharge Planning Services: CM Consult   Living arrangements for the past 2 months: [place] Expected Discharge Date: (unknown)                                    Prior Living Arrangements/Services Living arrangements for the past 2 months: [place] Lives with:: Facility Resident Patient language and need for interpreter reviewed:: No        Need for Family Participation in Patient Care: Yes (Comment)(family decision makers) Care giver support system in place?: Yes (comment)(SNF resident)   Criminal Activity/Legal Involvement Pertinent to Current Situation/Hospitalization: No - Comment as needed  Activities of Daily Living Home Assistive Devices/Equipment: Grab bars around toilet, Grab bars in shower, Hand-held shower hose,  Oxygen, Nebulizer, Scales, Blood pressure cuff, CBG Meter, Wheelchair, Eyeglasses, Other (Comment)(Boot for foot, foley catheter-[facility] has necessary equipment for thier residents) ADL Screening (condition at time of admission) Patient's cognitive ability adequate to safely complete daily activities?: No Is the patient deaf or have difficulty hearing?: Yes Does the patient have difficulty seeing, even when wearing glasses/contacts?: No Does the patient have difficulty concentrating, remembering, or making decisions?: Yes Patient able to express need for assistance with ADLs?: No Does the patient have difficulty dressing or bathing?: Yes Independently performs ADLs?: No Communication: Independent Dressing (OT): Dependent Is this a change from baseline?: Change from baseline, expected to last >3 days Grooming: Dependent Is this a change from baseline?: Change from baseline, expected to last >3 days Feeding: Dependent Is this a change from baseline?: Change from baseline, expected to last >3 days Bathing: Dependent Is this a change from baseline?: Change from baseline, expected to last >3 days Toileting: Dependent Is this a change from baseline?: Change from baseline, expected to last >3days In/Out Bed: Dependent Is this a change from baseline?: Change from baseline, expected to last >3 days Walks in Home: Dependent Is this a change from baseline?: Change from baseline, expected to last >3 days Does the patient have difficulty walking or climbing stairs?: Yes(secondary to weakness) Weakness of Legs: Both Weakness of Arms/Hands: Both  Permission Sought/Granted                  Emotional Assessment  Admission diagnosis:  Sepsis  Patient Active Problem List   Diagnosis Date Noted  . Sepsis ([place]) 07/18/2019  . Pneumonia 07/18/2019  . Palliative care by specialist   . DNR (do not resuscitate)   . Adult failure to thrive   . Osteomyelitis ([place]) 07/05/2019  . GI  bleeding 06/07/2019  . Anemia 06/07/2019  . Iron deficiency anemia 06/07/2019  . HTN (hypertension) 03/18/2019  . HLD (hyperlipidemia) 03/18/2019  . Pressure injury of skin 03/18/2019  . Acute renal failure ([place])   . Diabetes mellitus without complication ([place]) 83/66/2947  . GERD (gastroesophageal reflux disease) 03/17/2019  . BPH (benign prostatic hyperplasia) 03/17/2019  . Chronic diastolic (congestive) heart failure ([place]) 03/17/2019  . Fall 03/17/2019  . Hypokalemia 03/17/2019  . AKI (acute kidney injury) ([place]) 03/17/2019  . Elevated troponin 03/17/2019  . Rectal bleeding 07/23/2015  . GI bleed 07/23/2015  . Hypotension 07/23/2015  . Left pontine CVA ([place]) 11/20/2014  . Dysphagia, post-stroke 11/20/2014  . Left leg weakness 11/19/2014  . Type 2 diabetes mellitus ([place]) 11/19/2014  . Aortic heart murmur 11/19/2014  . Non-small cell carcinoma of lung ([place]) 11/19/2014  . Recurrent falls 11/19/2014  . Dizziness and giddiness 11/19/2014  . Stroke (cerebrum) ([place]) 11/19/2014  . Right hemiparesis ([place])   . Type II or unspecified type diabetes mellitus with neurological manifestations, not stated as uncontrolled 11/14/2013  . Pain 11/14/2013  . Closed fracture of metatarsal bone(s) 10/10/2013  . ASTHMA 02/23/2009  . CAD (coronary artery disease) 02/05/2009  . NEOPLASM, MALIGNANT, LUNG 02/04/2009  . Diabetes ([place]) 02/04/2009  . PULMONARY EMBOLISM 02/04/2009  . COPD (chronic obstructive pulmonary disease) ([place]) 02/04/2009  . DYSPNEA 02/04/2009   PCP:  Lajean Manes, MD Pharmacy:   [id number] [place], [place] [address] Phone: [phone] Fax: [phone]  [facility] [place] [place] [address] Phone: [phone] Fax: [phone]     Social Determinants of Health (SDOH) Interventions    Readmission Risk  Interventions Readmission Risk Prevention Plan 07/19/2019 03/20/2019  Transportation Screening Complete Complete  PCP or Specialist Appt within 5-7 Days - Complete  PCP or Specialist Appt within 3-5 Days Not Complete -  Not Complete comments N/A pt is resident of SNF -  Home Care Screening - Complete  Medication Review (RN CM) - Complete  HRI or Home Care Consult Not Complete -  [place] or Home Care Consult comments pt is resident of SNF -  Social Work Consult for [place] Planning/Counseling Complete -  Palliative Care Screening Complete -  Medication Review Press photographer) Complete -  Some recent data might be hidden

## 2019-07-20 DIAGNOSIS — R52 Pain, unspecified: Secondary | ICD-10-CM

## 2019-07-20 LAB — CBC
HCT: 24.1 % — ABNORMAL LOW (ref 39.0–52.0)
Hemoglobin: 7.7 g/dL — ABNORMAL LOW (ref 13.0–17.0)
MCH: 33.8 pg (ref 26.0–34.0)
MCHC: 32 g/dL (ref 30.0–36.0)
MCV: 105.7 fL — ABNORMAL HIGH (ref 80.0–100.0)
Platelets: 138 10*3/uL — ABNORMAL LOW (ref 150–400)
RBC: 2.28 MIL/uL — ABNORMAL LOW (ref 4.22–5.81)
RDW: 16 % — ABNORMAL HIGH (ref 11.5–15.5)
WBC: 2.5 10*3/uL — ABNORMAL LOW (ref 4.0–10.5)
nRBC: 0 % (ref 0.0–0.2)

## 2019-07-20 LAB — GLUCOSE, CAPILLARY
Glucose-Capillary: 188 mg/dL — ABNORMAL HIGH (ref 70–99)
Glucose-Capillary: 187 mg/dL — ABNORMAL HIGH (ref 70–99)
Glucose-Capillary: 191 mg/dL — ABNORMAL HIGH (ref 70–99)

## 2019-07-20 LAB — RENAL FUNCTION PANEL
Albumin: 1.7 g/dL — ABNORMAL LOW (ref 3.5–5.0)
Anion gap: 6 (ref 5–15)
BUN: 28 mg/dL — ABNORMAL HIGH (ref 8–23)
CO2: 26 mmol/L (ref 22–32)
Calcium: 9.5 mg/dL (ref 8.9–10.3)
Chloride: 112 mmol/L — ABNORMAL HIGH (ref 98–111)
Creatinine, Ser: 0.62 mg/dL (ref 0.61–1.24)
GFR calc Af Amer: >60 mL/min (ref >60)
GFR calc non Af Amer: >60 mL/min (ref >60)
Glucose, Bld: 203 mg/dL — ABNORMAL HIGH (ref 70–99)
Phosphorus: 2 mg/dL — ABNORMAL LOW (ref 2.5–4.6)
Potassium: 2.7 mmol/L — CL (ref 3.5–5.1)
Sodium: 144 mmol/L (ref 135–145)

## 2019-07-20 LAB — LEGIONELLA PNEUMOPHILA SEROGP 1 UR AG: L. pneumophila Serogp 1 Ur Ag: NEGATIVE

## 2019-07-20 MED ORDER — AMOXICILLIN-POT CLAVULANATE 875-125 MG PO TABS
1.0000 | ORAL_TABLET | Freq: Two times a day (BID) | ORAL | Status: DC
  Administered 2019-07-20 – 2019-07-22 (×5): 1 via ORAL
  Filled 2019-07-20 (×5): qty 1

## 2019-07-20 MED ORDER — MORPHINE SULFATE (CONCENTRATE) 10 MG/0.5ML PO SOLN
5.0000 mg | ORAL | Status: DC | PRN
  Administered 2019-07-21 – 2019-07-22 (×7): 5 mg via SUBLINGUAL
  Filled 2019-07-20 (×7): qty 0.5

## 2019-07-20 MED ORDER — POTASSIUM CHLORIDE 10 MEQ/100ML IV SOLN
10.0000 meq | INTRAVENOUS | Status: DC
  Administered 2019-07-20 (×3): 10 meq via INTRAVENOUS
  Filled 2019-07-20 (×3): qty 100

## 2019-07-20 MED ORDER — LORAZEPAM 1 MG PO TABS: 1.0000 mg | ORAL_TABLET | Freq: Four times a day (QID) | ORAL | Status: DC | PRN

## 2019-07-20 MED ORDER — DOXYCYCLINE HYCLATE 100 MG PO TABS
100.0000 mg | ORAL_TABLET | Freq: Two times a day (BID) | ORAL | Status: DC
  Administered 2019-07-20 – 2019-07-22 (×5): 100 mg via ORAL
  Filled 2019-07-20 (×6): qty 1

## 2019-07-20 MED ORDER — POTASSIUM CHLORIDE CRYS ER 20 MEQ PO TBCR: 40.0000 meq | EXTENDED_RELEASE_TABLET | Freq: Two times a day (BID) | ORAL | Status: DC

## 2019-07-20 NOTE — TOC Progression Note (Signed)
Transition of Care [facility]) - Progression Note    Patient Details  Name: ARIK HUSMANN MRN: [id number] Date of Birth: 10/06/1926  Transition of Care [facility]) CM/SW [place], [place] Phone Number: [phone] 07/20/2019, 4:05 PM  Clinical Narrative:   Family deciding to pursue hospice care - residential- after family meeting with PMT today. Son Laverna Peace requested referral to [employer], pt has been referred, and mentioned that if [facility] is unavailable, he and pt's daughter are discussing if they would at that point like referral to [place] or [place]. Will let TOC team know of decisions.  Will follow.    Expected Discharge Plan: Skilled Nursing Facility Barriers to Discharge: Continued Medical Work up  Expected Discharge Plan and Services Expected Discharge Plan: [place] In-house Referral: Clinical Social Work Discharge Planning Services: CM Consult   Living arrangements for the past 2 months: [place] Expected Discharge Date: (unknown)                                     Social Determinants of Health (SDOH) Interventions    Readmission Risk Interventions Readmission Risk Prevention Plan 07/19/2019 03/20/2019  Transportation Screening Complete Complete  PCP or Specialist Appt within 5-7 Days - Complete  PCP or Specialist Appt within 3-5 Days Not Complete -  Not Complete comments N/A pt is resident of SNF -  Home Care Screening - Complete  Medication Review (RN CM) - Complete  HRI or Home Care Consult Not Complete -  [place] or Home Care Consult comments pt is resident of SNF -  Social Work Consult for [place] Planning/Counseling Complete -  Palliative Care Screening Complete -  Medication Review Press photographer) Complete -  Some recent data might be hidden

## 2019-07-20 NOTE — Progress Notes (Addendum)
[facility] 1239 - Manufacturing engineer ([place]) RN Note  Received request from [place] for family interest in [place]. Unfortunately [facility] is not able to offer a room today. Left message with son Bhavik Cabiness to acknowledge referral. CSW Sharren Bridge is aware Manufacturing engineer liaison will follow up with CSW and family tomorrow or sooner if room becomes available.   Please do not hesitate to call with questions.  Thank you,   Gar Ponto, RN [facility] Liaison  [place] are on AMION   Update: Spoke to son Tana Conch via telephone to confirm interest, explain services and answer questions. Made aware [facility] has no bed availability at this time and would update in the morning.

## 2019-07-20 NOTE — Progress Notes (Addendum)
[facility]  PROGRESS NOTE    Chad Fuller  ZOX:[id number] DOB: Feb 25, 1926 DOA: 07/17/2019 PCP: Lajean Manes, MD   Brief Narrative:   Chad Fuller a 85 y.o.malewithhistory of dementia, pulmonary embolism, hypertension, chronic diastolic CHF, chronic anemia who was recently admitted last month for possible GI bleed at that time patient also was found to have osteomyelitis of the left foot was found to be hypoxic more than usual at the living facility and was transferred to the ER at [facility]. In the ER patient was found to have a fever 103 F.CT angiogram of the chest was showing multiple opacity concerning for bilateral pneumonia. COVID-19 test was negative. Patient was started on empiric antibiotics for pneumonia and family requested transfer to [facility] long hospital.   Assessment & Plan:   Principal Problem:   Sepsis ([place]) Active Problems:   COPD (chronic obstructive pulmonary disease) ([place])   Diabetes mellitus without complication (HCC)   Chronic diastolic (congestive) heart failure (HCC)   HTN (hypertension)   Anemia   Pneumonia   Palliative care by specialist   DNR (do not resuscitate)   Adult failure to thrive   Sepsis with acute respiratory failure secondary to pneumonia     - empiric antibiotics w/ vanc/cefepime.       - COVID-19 test is negative     - MRSA is negative     - Bld Cx pending     - afebrile ON and BPs are improved; will make de-escalation decision as we get more data; will be difficult to de-escalate w/o Cx data from wounds     - PC consulted for goals of care     - PNA likely asp pna as he has shown delayed cough/swallowing w/ SLP and previous Hx of silent asp     - SLP currently recommending: " only FEW TSPS of NECTAR liquids with full supervision - for comfort- stop intake if pt coughing.  Advised RN to provide necessary medications with nectar or pureed crushed."      - It's reasonable to deescalate to augmentin to cover for asp pna;  resume his chronic doxy for osteo  Hypertension      - holding antihypertensives due to low normal blood pressure on presentation      - was briefly on [facility] at [facility] which was discontinued  Chronic diastolic CHF      - holding off any diuretics due to low normal blood pressure at presentation.       - Closely observe respiratory status.  Diabetes mellitus type 2      - on Lantus insulin in the morning with sliding scale coverage.  Macrocytic Anemia      - with history of recent GI bleed     - no frank bleed noted  Osteomyelitis of the left heel and sacrum with bone visible.      - WOCN consulted     - vanc/cefepime     - Also has mild pain ulcers around the Foley catheter.     - was on chronic doxy; will see outcome of family conference today, consider ortho call after  Hypokalemia     - 3.3 at admission     - Mg2+ ok     - l abs pending, monitor  Hypophosphatemia     - K-phos. Monitor.  Chronic indwelling Foley catheter for obstructive uropathy.  Dementia  Should be good to TTF (med-surg). K+ is low again this morning. IV and  PO K+ ordered. Phos improved. Continue K-phos. Family conference today.  DVT prophylaxis: SCDs Code Status: DNR   Disposition Plan: TBD   Consultants:   Palliative Cae  Antimicrobials:  . Augmentin, doxy    Subjective: "Ok."  Objective: Vitals:   07/20/19 0600 07/20/19 0745 07/20/19 0800 07/20/19 0900  BP: 140/69  (!) 148/73 139/77  Pulse: 92  95 94  Resp: 19  (!) 22 (!) 24  Temp:  (!) 96 F (35.6 C)    TempSrc:  Axillary    SpO2: 99%  100% 100%  Weight:      Height:        Intake/Output Summary (Last 24 hours) at 07/20/2019 1122 Last data filed at 07/20/2019 0709 Gross per 24 hour  Intake 1805.81 ml  Output 1200 ml  Net 605.81 ml   Filed Weights   07/18/19 0500 07/19/19 0500 07/20/19 0444  Weight: 80.2 kg 81.5 kg 82.5 kg    Examination:  General exam: 84 y.o. male Appears calm but tired and  somewhat confused. Respiratory system: Decreased at bases. left rhonchi noted Cardiovascular system: S1 & S2 heard, RRR. 2/6 holosystolic murmur, No JVD, rubs, gallops or clicks. No pedal edema. Gastrointestinal system: Abdomen is nondistended, soft and nontender. No organomegaly or masses felt. Normal bowel sounds heard. Central nervous system: Alert and oriented. No focal neurological deficits. Extremities: LLE heal bandage CDI, b/l boots in place    Data Reviewed: I have personally reviewed following labs and imaging studies.  CBC: Recent Labs  Lab 07/18/19 0056 07/18/19 0434 07/19/19 0709  WBC 5.0 4.6 3.8*  NEUTROABS 3.7  --   --   HGB 7.6* 9.1* 7.9*  HCT 23.2* 28.8* 25.1*  MCV 105.5* 105.5* 105.9*  PLT 189 174 099   Basic Metabolic Panel: Recent Labs  Lab 07/18/19 0056 07/18/19 0434 07/19/19 0709  NA 139 138 143  K 3.3* 3.2* 2.8*  CL 97* 99 107  CO2 29 27 26   GLUCOSE 203* 194* 96  BUN 31* 31* 25*  CREATININE 0.81 0.78 0.60*  CALCIUM 9.2 9.0 9.3  MG  --   --  2.1  PHOS  --   --  1.6*   GFR: Estimated Creatinine Clearance: 71.4 mL/min (A) (by C-G formula based on SCr of 0.6 mg/dL (L)). Liver Function Tests: Recent Labs  Lab 07/18/19 0056  AST 36  ALT 19  ALKPHOS 56  BILITOT 0.6  PROT 6.0*  ALBUMIN 2.0*   No results for input(s): LIPASE, AMYLASE in the last 168 hours. No results for input(s): AMMONIA in the last 168 hours. Coagulation Profile: No results for input(s): INR, PROTIME in the last 168 hours. Cardiac Enzymes: No results for input(s): CKTOTAL, CKMB, CKMBINDEX, TROPONINI in the last 168 hours. BNP (last 3 results) No results for input(s): PROBNP in the last 8760 hours. HbA1C: No results for input(s): HGBA1C in the last 72 hours. CBG: Recent Labs  Lab 07/19/19 0740 07/19/19 1130 07/19/19 1620 07/19/19 2231 07/20/19 0756  GLUCAP 85 152* 185* 188* 187*   Lipid Profile: No results for input(s): CHOL, HDL, LDLCALC, TRIG, CHOLHDL,  LDLDIRECT in the last 72 hours. Thyroid Function Tests: No results for input(s): TSH, T4TOTAL, FREET4, T3FREE, THYROIDAB in the last 72 hours. Anemia Panel: No results for input(s): VITAMINB12, FOLATE, FERRITIN, TIBC, IRON, RETICCTPCT in the last 72 hours. Sepsis Labs: Recent Labs  Lab 07/18/19 0056 07/18/19 0434 07/18/19 1202  PROCALCITON 2.26  --   --   LATICACIDVEN 2.2* 2.1* 1.6  Recent Results (from the past 240 hour(s))  SARS Coronavirus 2 (CEPHEID- Performed in [place] hospital lab), Hosp Order     Status: None   Collection Time: 07/18/19 12:56 AM   Specimen: Nasopharyngeal Swab  Result Value Ref Range Status   SARS Coronavirus 2 NEGATIVE NEGATIVE Final    Comment: (NOTE) If result is NEGATIVE SARS-CoV-2 target nucleic acids are NOT DETECTED. The SARS-CoV-2 RNA is generally detectable in upper and lower  respiratory specimens during the acute phase of infection. The lowest  concentration of SARS-CoV-2 viral copies this assay can detect is 250  copies / mL. A negative result does not preclude SARS-CoV-2 infection  and should not be used as the sole basis for treatment or other  patient management decisions.  A negative result may occur with  improper specimen collection / handling, submission of specimen other  than nasopharyngeal swab, presence of viral mutation(s) within the  areas targeted by this assay, and inadequate number of viral copies  (<250 copies / mL). A negative result must be combined with clinical  observations, patient history, and epidemiological information. If result is POSITIVE SARS-CoV-2 target nucleic acids are DETECTED. The SARS-CoV-2 RNA is generally detectable in upper and lower  respiratory specimens dur ing the acute phase of infection.  Positive  results are indicative of active infection with SARS-CoV-2.  Clinical  correlation with patient history and other diagnostic information is  necessary to determine patient infection status.   Positive results do  not rule out bacterial infection or co-infection with other viruses. If result is PRESUMPTIVE POSTIVE SARS-CoV-2 nucleic acids MAY BE PRESENT.   A presumptive positive result was obtained on the submitted specimen  and confirmed on repeat testing.  While 2019 novel coronavirus  (SARS-CoV-2) nucleic acids may be present in the submitted sample  additional confirmatory testing may be necessary for epidemiological  and / or clinical management purposes  to differentiate between  SARS-CoV-2 and other Sarbecovirus currently known to infect humans.  If clinically indicated additional testing with an alternate test  methodology [phone]) is advised. The SARS-CoV-2 RNA is generally  detectable in upper and lower respiratory sp ecimens during the acute  phase of infection. The expected result is Negative. Fact Sheet for Patients:  StrictlyIdeas.no Fact Sheet for Healthcare Providers: BankingDealers.co.za This test is not yet approved or cleared by the [place] FDA and has been authorized for detection and/or diagnosis of SARS-CoV-2 by FDA under an Emergency Use Authorization (EUA).  This EUA will remain in effect (meaning this test can be used) for the duration of the COVID-19 declaration under Section 564(b)(1) of the Act, 21 U.S.C. section 360bbb-3(b)(1), unless the authorization is terminated or revoked sooner. Performed at [facility], [place] [address], [place], [place] [address]   Culture, blood (routine x 2)     Status: None (Preliminary result)   Collection Time: 07/18/19 12:57 AM   Specimen: BLOOD LEFT HAND  Result Value Ref Range Status   Specimen Description   Final    BLOOD LEFT HAND Performed at [facility], [place] [address] [place], [place] [address]    Special Requests   Final    BOTTLES DRAWN AEROBIC ONLY Blood Culture adequate volume Performed at [place] [address] [place], [place] [address]    Culture   Final    NO GROWTH 2 DAYS Performed at [place] [address] [place], [place] [address]    Report  Status PENDING  Incomplete  MRSA PCR Screening     Status: None   Collection Time: 07/18/19 12:57 AM   Specimen: Urine, Catheterized; Nasopharyngeal  Result Value Ref Range Status   MRSA by PCR NEGATIVE NEGATIVE Final    Comment:        The GeneXpert MRSA Assay (FDA approved for NASAL specimens only), is one component of a comprehensive MRSA colonization surveillance program. It is not intended to diagnose MRSA infection nor to guide or monitor treatment for MRSA infections. Performed at [facility], [place] [address], [place], [place] [address]   Culture, blood (routine x 2)     Status: None (Preliminary result)   Collection Time: 07/18/19  1:02 AM   Specimen: BLOOD RIGHT HAND  Result Value Ref Range Status   Specimen Description   Final    BLOOD RIGHT HAND Performed at [facility], [place] [address], [place], [place] [address]    Special Requests   Final    BOTTLES DRAWN AEROBIC ONLY Blood Culture adequate volume Performed at [place] [address], [place], [place] [address]    Culture   Final    NO GROWTH 2 DAYS Performed at [place] [address], [place], [place] [address]    Report Status PENDING  Incomplete     Radiology Studies: No results found.   Scheduled Meds: . Chlorhexidine Gluconate Cloth  6 each Topical Daily  . collagenase   Topical Daily  . escitalopram  5 mg Oral Daily  . ezetimibe  10 mg Oral Daily  . feeding supplement (PRO-STAT SUGAR FREE 64)  30 mL Oral TID WC  . heparin  5,000 Units Subcutaneous Q8H  . insulin aspart  0-9 Units Subcutaneous TID WC  . insulin glargine  30 Units Subcutaneous Daily  . mouth rinse  15 mL Mouth Rinse BID  . multivitamin with minerals  1 tablet Oral Daily  . potassium  phosphate (monobasic)  500 mg Oral TID WC  . traMADol  50 mg Oral Q8H  . vitamin B-12  2,000 mcg Oral Daily   Continuous Infusions: . sodium chloride 75 mL/hr at 07/20/19 0343  . ceFEPime (MAXIPIME) IV Stopped (07/20/19 0630)  . vancomycin Stopped (07/20/19 0450)     LOS: 3 days    Time spent: 35 minutes spent in the coordination of care today.    Jonnie Finner, DO [facility] Pager [phone]  If 7PM-7AM, please contact night-coverage [email or web address] Password [email or web address] 07/20/2019, 11:22 AM

## 2019-07-20 NOTE — Progress Notes (Signed)
SLP Cancellation Note  Patient Details Name: Chad Fuller MRN: [id number] DOB: Aug 04, 1926   Cancelled treatment:       Reason Eval/Treat Not Completed: Patient's level of consciousness. Patient asleep and unable to be aroused for safe PO intake. Per RN, he has been sleeping most of the day. She also stated that when he swallows, he exhibits throat clearing and coughing. Will continue to follow for readiness of SLP swallow eval.   Dannial Monarch 07/20/2019, 2:40 PM   Sonia Baller, MA, CCC-SLP Speech Therapy [facility] Acute Rehab Pager: [phone]

## 2019-07-20 NOTE — Progress Notes (Signed)
CRITICAL VALUE ALERT  Critical Value: K+ 2.7  Date & Time Notied: 07/20/2019 1215  Provider Notified: Yes, Dr. Jiles Prows  Orders Received/Actions taken: Awaiting orders

## 2019-07-20 NOTE — Progress Notes (Signed)
   07/20/19 1200  Clinical Encounter Type  Visited With Family;Other (Comment) (Daughter )  Visit Type Initial;Psychological support;Spiritual support  Referral From Palliative care team  Consult/Referral To Chaplain  Spiritual Encounters  Spiritual Needs Emotional;Other (Comment) (Spiritual Care Conversation/Support)  Stress Factors  Family Stress Factors Health changes   I spoke with the patient's daughter per spiritual care consult. She stated that the family understands what is going on with him and that they will meet with hospice this afternoon. She said that they have good support and know that Mr. Meadowcroft has lived a long and good life.   Please, contact Spiritual Care for further assistance.   Chaplain Shanon Ace M.Div., [facility]

## 2019-07-21 DIAGNOSIS — J690 Pneumonitis due to inhalation of food and vomit: Secondary | ICD-10-CM

## 2019-07-21 MED ORDER — AMOXICILLIN-POT CLAVULANATE 875-125 MG PO TABS: 1.0000 | ORAL_TABLET | Freq: Two times a day (BID) | ORAL | 0 refills | Status: AC

## 2019-07-21 MED ORDER — AMOXICILLIN-POT CLAVULANATE 875-125 MG PO TABS: 1.0000 | ORAL_TABLET | Freq: Two times a day (BID) | ORAL | 0 refills | Status: DC

## 2019-07-21 MED ORDER — MORPHINE SULFATE (CONCENTRATE) 10 MG/0.5ML PO SOLN: 5.0000 mg | ORAL | 0 refills | Status: AC | PRN

## 2019-07-21 MED ORDER — LORAZEPAM 1 MG PO TABS: 1.0000 mg | ORAL_TABLET | Freq: Four times a day (QID) | ORAL | 0 refills | Status: AC | PRN

## 2019-07-21 NOTE — Progress Notes (Signed)
Chad Kitchen  PROGRESS NOTE    NOCHUM Fuller  NTI:[id number] DOB: 1926-11-02 DOA: 07/17/2019 PCP: Lajean Manes, MD   Brief Narrative:   Chad Fuller a 85 y.o.malewithhistory of dementia, pulmonary embolism, hypertension, chronic diastolic CHF, chronic anemia who was recently admitted last month for possible GI bleed at that time patient also was found to have osteomyelitis of the left foot was found to be hypoxic more than usual at the living facility and was transferred to the ER at [facility]. In the ER patient was found to have a fever 103 F.CT angiogram of the chest was showing multiple opacity concerning for bilateral pneumonia. COVID-19 test was negative. Patient was started on empiric antibiotics for pneumonia and family requested transfer to [facility] hospital.   Assessment & Plan:   Principal Problem:   Sepsis ([place]) Active Problems:   COPD (chronic obstructive pulmonary disease) (HCC)   Pain, generalized   Diabetes mellitus without complication (HCC)   Chronic diastolic (congestive) heart failure (HCC)   HTN (hypertension)   Anemia   Pneumonia   Palliative care by specialist   DNR (do not resuscitate)   Adult failure to thrive   Sepsis with acute respiratory failure secondary to pneumonia - empiric antibiotics w/ vanc/cefepime.  - COVID-19 test is negative - MRSA is negative - Bld Cx pending - afebrile ON and BPs are improved; will make de-escalation decision as we get more data; will be difficult to de-escalate w/o Cx data from wounds - PC consulted for goals of care - PNA likely asp pna as he has shown delayed cough/swallowing w/ SLP and previous Hx of silent asp - SLP recommended: "only FEW TSPS of NECTAR liquids with full supervision - for comfort- stop intake if pt coughing. Advised RN to provide necessary medications with nectar or pureed crushed." This was prior to determination to change him to comfort care  measures. At this point he is pleasure feeds w/ as tolerated diet w/ the understanding that he has aspiration risks and the consequences associated with it - It's reasonable to deescalate to augmentin to cover for asp pna; resume his chronic doxy for osteo - will finish out augmentin tx (through 7/252020) per family wishes; they have otherwise elected comfort care only measures; no further lab draws or aggressive intervention  Hypertension  - holding antihypertensives due to low normal blood pressure on presentation  - was briefly on Levophed at [employer] which was discontinued - family has elected comfort care measures  Chronic diastolic CHF  - holding off any diuretics due to low normal blood pressure at presentation.  - Closely observe respiratory status. - family has elected comfort care measures  Diabetes mellitus type 2  - on Lantus insulin in the morning with sliding scale coverage. - family has elected comfort care measures  Macrocytic Anemia  - with history of recent GI bleed - no frank bleed noted - family has elected comfort care measures  Osteomyelitis of the left heel and sacrum with bone visible.  - WOCN consulted - vanc/cefepime - Also has mild pain ulcers around the Foley catheter. - was on chronic doxy; will see outcome of family conference today, consider ortho call after - family has elected comfort care measures; no further aggressive intervention, can continue doxy along to conclusion of augmentin tx  Hypokalemia - 3.3 at admission - Mg2+ ok -2.7 yesterday; K+ ordered - family has elected comfort care measures; no further lab draws/aggressive intervention  Hypophosphatemia - K-phos. Monitor. -  family has elected comfort care measures  Chronic indwelling Foley catheter for obstructive uropathy.  Dementia  Family conference yesterday. They  have elected comfort care measures. They are requesting abxtreatment continue to conclusion of PNAtx. Will continue augmentinfor asp pnathrough 07/22/2019. At this point he is pleasure feeds w/ an as tolerated diet w/ understanding by family that there is a known risk of aspiration and the consequences that come with that risk. Patient will be transferred tohome with hospice. D/c PICC; foley to remain in place.    DVT prophylaxis: None, hospice Code Status: DNR/Comfort only   Disposition Plan: Discharge to home in AM w/ hospice   Consultants:   Palliative Cae  Antimicrobials:   Augmentin, doxy   Subjective: No acute events ON per nursing.  Objective: Vitals:   07/21/19 0551 07/21/19 1000 07/21/19 1005 07/21/19 1427  BP: 121/81   (!) 106/59  Pulse: 92   94  Resp: 18   (!) 22  Temp: (!) 97.4 F (36.3 C)   98 F (36.7 C)  TempSrc: Oral   Oral  SpO2: 100% 100% 95% 91%  Weight:      Height:        Intake/Output Summary (Last 24 hours) at 07/21/2019 1508 Last data filed at 07/21/2019 0924 Gross per 24 hour  Intake 662.56 ml  Output 1900 ml  Net -1237.44 ml   Filed Weights   07/18/19 0500 07/19/19 0500 07/20/19 0444  Weight: 80.2 kg 81.5 kg 82.5 kg    Examination:  General exam:85 y.o.maleAppears calm but tired and somewhat confused. Respiratory system:Decreased at bases. left rhonchi noted Cardiovascular system:S1 &S2 heard, PNT.6/1 holosystolic murmur,No JVD, rubs, gallops or clicks. No pedal edema. Gastrointestinal system:Abdomen is nondistended, soft and nontender. No organomegaly or masses felt. Normal bowel sounds heard. Central nervous system:somnolent    Data Reviewed: I have personally reviewed following labs and imaging studies.  CBC: Recent Labs  Lab 07/18/19 0056 07/18/19 0434 07/19/19 0709 07/20/19 1133  WBC 5.0 4.6 3.8* 2.5*  NEUTROABS 3.7  --   --   --   HGB 7.6* 9.1* 7.9* 7.7*  HCT 23.2* 28.8* 25.1* 24.1*  MCV 105.5* 105.5*  105.9* 105.7*  PLT 189 174 159 443*   Basic Metabolic Panel: Recent Labs  Lab 07/18/19 0056 07/18/19 0434 07/19/19 0709 07/20/19 1133  NA 139 138 143 144  K 3.3* 3.2* 2.8* 2.7*  CL 97* 99 107 112*  CO2 29 27 26 26   GLUCOSE 203* 194* 96 203*  BUN 31* 31* 25* 28*  CREATININE 0.81 0.78 0.60* 0.62  CALCIUM 9.2 9.0 9.3 9.5  MG  --   --  2.1  --   PHOS  --   --  1.6* 2.0*   GFR: Estimated Creatinine Clearance: 71.4 mL/min (by C-G formula based on SCr of 0.62 mg/dL). Liver Function Tests: Recent Labs  Lab 07/18/19 0056 07/20/19 1133  AST 36  --   ALT 19  --   ALKPHOS 56  --   BILITOT 0.6  --   PROT 6.0*  --   ALBUMIN 2.0* 1.7*   No results for input(s): LIPASE, AMYLASE in the last 168 hours. No results for input(s): AMMONIA in the last 168 hours. Coagulation Profile: No results for input(s): INR, PROTIME in the last 168 hours. Cardiac Enzymes: No results for input(s): CKTOTAL, CKMB, CKMBINDEX, TROPONINI in the last 168 hours. BNP (last 3 results) No results for input(s): PROBNP in the last 8760 hours. HbA1C: No results for input(s):  HGBA1C in the last 72 hours. CBG: Recent Labs  Lab 07/19/19 1130 07/19/19 1620 07/19/19 2231 07/20/19 0756 07/20/19 1205  GLUCAP 152* 185* 188* 187* 191*   Lipid Profile: No results for input(s): CHOL, HDL, LDLCALC, TRIG, CHOLHDL, LDLDIRECT in the last 72 hours. Thyroid Function Tests: No results for input(s): TSH, T4TOTAL, FREET4, T3FREE, THYROIDAB in the last 72 hours. Anemia Panel: No results for input(s): VITAMINB12, FOLATE, FERRITIN, TIBC, IRON, RETICCTPCT in the last 72 hours. Sepsis Labs: Recent Labs  Lab 07/18/19 0056 07/18/19 0434 07/18/19 1202  PROCALCITON 2.26  --   --   LATICACIDVEN 2.2* 2.1* 1.6    Recent Results (from the past 240 hour(s))  SARS Coronavirus 2 (CEPHEID- Performed in [place] hospital lab), Hosp Order     Status: None   Collection Time: 07/18/19 12:56 AM   Specimen: Nasopharyngeal Swab   Result Value Ref Range Status   SARS Coronavirus 2 NEGATIVE NEGATIVE Final    Comment: (NOTE) If result is NEGATIVE SARS-CoV-2 target nucleic acids are NOT DETECTED. The SARS-CoV-2 RNA is generally detectable in upper and lower  respiratory specimens during the acute phase of infection. The lowest  concentration of SARS-CoV-2 viral copies this assay can detect is 250  copies / mL. A negative result does not preclude SARS-CoV-2 infection  and should not be used as the sole basis for treatment or other  patient management decisions.  A negative result may occur with  improper specimen collection / handling, submission of specimen other  than nasopharyngeal swab, presence of viral mutation(s) within the  areas targeted by this assay, and inadequate number of viral copies  (<250 copies / mL). A negative result must be combined with clinical  observations, patient history, and epidemiological information. If result is POSITIVE SARS-CoV-2 target nucleic acids are DETECTED. The SARS-CoV-2 RNA is generally detectable in upper and lower  respiratory specimens dur ing the acute phase of infection.  Positive  results are indicative of active infection with SARS-CoV-2.  Clinical  correlation with patient history and other diagnostic information is  necessary to determine patient infection status.  Positive results do  not rule out bacterial infection or co-infection with other viruses. If result is PRESUMPTIVE POSTIVE SARS-CoV-2 nucleic acids MAY BE PRESENT.   A presumptive positive result was obtained on the submitted specimen  and confirmed on repeat testing.  While 2019 novel coronavirus  (SARS-CoV-2) nucleic acids may be present in the submitted sample  additional confirmatory testing may be necessary for epidemiological  and / or clinical management purposes  to differentiate between  SARS-CoV-2 and other Sarbecovirus currently known to infect humans.  If clinically indicated additional  testing with an alternate test  methodology [phone]) is advised. The SARS-CoV-2 RNA is generally  detectable in upper and lower respiratory sp ecimens during the acute  phase of infection. The expected result is Negative. Fact Sheet for Patients:  StrictlyIdeas.no Fact Sheet for Healthcare Providers: BankingDealers.co.za This test is not yet approved or cleared by the [place] FDA and has been authorized for detection and/or diagnosis of SARS-CoV-2 by FDA under an Emergency Use Authorization (EUA).  This EUA will remain in effect (meaning this test can be used) for the duration of the COVID-19 declaration under Section 564(b)(1) of the Act, 21 U.S.C. section 360bbb-3(b)(1), unless the authorization is terminated or revoked sooner. Performed at [facility] [place] [address], [place], [place] [address]   Culture, blood (routine x 2)     Status: None (  Preliminary result)   Collection Time: 07/18/19 12:57 AM   Specimen: BLOOD LEFT HAND  Result Value Ref Range Status   Specimen Description   Final    BLOOD LEFT HAND Performed at [facility], [address], [place], [place] [address]    Special Requests   Final    BOTTLES DRAWN AEROBIC ONLY Blood Culture adequate volume Performed at [facility] [address] [place], [place] [address]    Culture   Final    NO GROWTH 3 DAYS Performed at [facility], [place] [address] [place], [place] [address]    Report Status PENDING  Incomplete  MRSA PCR Screening     Status: None   Collection Time: 07/18/19 12:57 AM   Specimen: Urine, Catheterized; Nasopharyngeal  Result Value Ref Range Status   MRSA by PCR NEGATIVE NEGATIVE Final    Comment:        The GeneXpert MRSA Assay (FDA approved for NASAL specimens only), is one component of a comprehensive MRSA colonization surveillance program. It is not intended to diagnose MRSA infection  nor to guide or monitor treatment for MRSA infections. Performed at [facility], [place] [address], [place], [place] [address]   Culture, blood (routine x 2)     Status: None (Preliminary result)   Collection Time: 07/18/19  1:02 AM   Specimen: BLOOD RIGHT HAND  Result Value Ref Range Status   Specimen Description   Final    BLOOD RIGHT HAND Performed at [facility], [place] [address], [place], [place] [address]    Special Requests   Final    BOTTLES DRAWN AEROBIC ONLY Blood Culture adequate volume Performed at [facility] [address], [place], [place] [address]    Culture   Final    NO GROWTH 3 DAYS Performed at [facility], [place] [address], [place], [place] [address]    Report Status PENDING  Incomplete         Radiology Studies: No results found.      Scheduled Meds: . amoxicillin-clavulanate  1 tablet Oral Q12H  . Chlorhexidine Gluconate Cloth  6 each Topical Daily  . collagenase   Topical Daily  . doxycycline  100 mg Oral Q12H  . mouth rinse  15 mL Mouth Rinse BID   Continuous Infusions: . sodium chloride 10 mL/hr at 07/20/19 1602     LOS: 4 days    Time spent: 35 minutes spent in the coordination of care today.    Jonnie Finner, DO [facility] Pager [phone]  If 7PM-7AM, please contact night-coverage [email or web address] Password [facility] 07/21/2019, 3:08 PM

## 2019-07-21 NOTE — Discharge Summary (Addendum)
. Physician Discharge Summary  Chad Fuller EGB:[id number] DOB: [birth year] DOA: 07/17/2019  PCP: Lajean Manes, MD  Admit date: 07/17/2019 Discharge date: 07/22/2019  Admitted From: SNF Disposition:  Discharged to home with hospice.  Recommendations for Outpatient Follow-up:  1. Follow up with PCP as needed.  Discharge Condition: Stable  CODE STATUS: DNR   Brief/Interim Summary: Chad Fuller a 85 y.o.malewithhistory of dementia, pulmonary embolism, hypertension, chronic diastolic CHF, chronic anemia who was recently admitted last month for possible GI bleed at that time patient also was found to have osteomyelitis of the left foot was found to be hypoxic more than usual at the living facility and was transferred to the ER at [facility]. In the ER patient was found to have a fever 103 F.CT angiogram of the chest was showing multiple opacity concerning for bilateral pneumonia. COVID-19 test was negative. Patient was started on empiric antibiotics for pneumonia and family requested transfer to [facility] long hospital.  Discharge Diagnoses:  Principal Problem:   Sepsis ([place]) Active Problems:   COPD (chronic obstructive pulmonary disease) ([place])   Pain, generalized   Diabetes mellitus without complication (HCC)   Chronic diastolic (congestive) heart failure (HCC)   HTN (hypertension)   Anemia   Pneumonia   Palliative care by specialist   DNR (do not resuscitate)   Adult failure to thrive  Sepsis with acute respiratory failure secondary to pneumonia - empiric antibiotics w/ vanc/cefepime.  - COVID-19 test is negative - MRSA is negative - Bld Cx pending - afebrile ON and BPs are improved; will make de-escalation decision as we get more data; will be difficult to de-escalate w/o Cx data from wounds - PC consulted for goals of care - PNA likely asp pna as he has shown delayed cough/swallowing w/ SLP and previous Hx of silent asp -  SLP recommended: "only FEW TSPS of NECTAR liquids with full supervision - for comfort- stop intake if pt coughing. Advised RN to provide necessary medications with nectar or pureed crushed." This was prior to determination to change him to comfort care measures. At this point he is pleasure feeds w/ as tolerated diet w/ the understanding that he has aspiration risks and the consequences associated with it - It's reasonable to deescalate to augmentin to cover for asp pna; resume his chronic doxy for osteo     - will finish out augmentin tx (through 7/252020) per family wishes; they have otherwise elected comfort care only measures; no further lab draws or aggressive intervention  Hypertension  - holding antihypertensives due to low normal blood pressure on presentation  - was briefly on Levophed at [facility] which was discontinued     - family has elected comfort care measures  Chronic diastolic CHF  - holding off any diuretics due to low normal blood pressure at presentation.  - Closely observe respiratory status.     - family has elected comfort care measures  Diabetes mellitus type 2  - on Lantus insulin in the morning with sliding scale coverage.     - family has elected comfort care measures  Macrocytic Anemia  - with history of recent GI bleed - no frank bleed noted     - family has elected comfort care measures  Osteomyelitis of the left heel and sacrum with bone visible.  - WOCN consulted - vanc/cefepime - Also has mild pain ulcers around the Foley catheter. - was on chronic doxy; will see outcome of family conference today, consider ortho call after     -  family has elected comfort care measures; no further aggressive intervention, can continue doxy along to conclusion of augmentin tx  Hypokalemia - 3.3 at admission - Mg2+ ok - 2.7 yesterday; K+ ordered     - family has elected comfort care  measures; no further lab draws/aggressive intervention  Hypophosphatemia - K-phos. Monitor.     - family has elected comfort care measures  Chronic indwelling Foley catheter for obstructive uropathy.  Dementia  Family conference 07/20/2019. They have elected comfort care measures. They are requesting abxtreatment continue to conclusion of PNAtx. Will continue augmentinfor asp pnathrough 07/22/2019. At this point he is pleasure feeds w/ an as tolerated diet w/ understanding by family that there is a known risk of aspiration and the consequences that come with that risk. Patient will be transferred tohome with hospice. D/c PICC; foley to remain in place.   Discharge Instructions   Allergies as of 07/21/2019      Reactions   Pantoprazole Diarrhea   Protonix [pantoprazole Sodium] Diarrhea      Medication List    STOP taking these medications   Accu-Chek Aviva Plus test strip Generic drug: glucose blood   acetaminophen 325 MG tablet Commonly known as: TYLENOL   B-D ULTRAFINE III SHORT PEN 31G X 8 MM Misc Generic drug: Insulin Pen Needle   BOOST GLUCOSE CONTROL PO   cefTRIAXone  IVPB Commonly known as: ROCEPHIN   cholecalciferol 25 MCG (1000 UT) tablet Commonly known as: VITAMIN D3   ergocalciferol 1.25 MG (50000 UT) capsule Commonly known as: VITAMIN D2   escitalopram 5 MG tablet Commonly known as: LEXAPRO   ezetimibe 10 MG tablet Commonly known as: ZETIA   feeding supplement (PRO-STAT SUGAR FREE 64) Liqd   finasteride 5 MG tablet Commonly known as: PROSCAR   furosemide 40 MG tablet Commonly known as: LASIX   HumaLOG KwikPen 100 UNIT/ML KwikPen Generic drug: insulin lispro   insulin glargine 100 UNIT/ML injection Commonly known as: LANTUS   ipratropium-albuterol 0.5-2.5 (3) MG/3ML Soln Commonly known as: DUONEB   losartan 25 MG tablet Commonly known as: COZAAR   multivitamin with minerals Tabs tablet   omeprazole 40 MG capsule Commonly  known as: PRILOSEC   sennosides-docusate sodium 8.6-50 MG tablet Commonly known as: SENOKOT-S   sodium hypochlorite 0.125 % Soln Commonly known as: DAKIN'S 1/4 STRENGTH   traMADol 50 MG tablet Commonly known as: ULTRAM   vitamin B-12 1000 MCG tablet Commonly known as: CYANOCOBALAMIN     TAKE these medications   amoxicillin-clavulanate 875-125 MG tablet Commonly known as: AUGMENTIN Take 1 tablet by mouth every 12 (twelve) hours for 2 days.   LORazepam 1 MG tablet Commonly known as: ATIVAN Take 1 tablet (1 mg total) by mouth every 6 (six) hours as needed for up to 5 days for anxiety.   morphine CONCENTRATE 10 MG/0.5ML Soln concentrated solution Place 0.25 mLs (5 mg total) under the tongue every 2 (two) hours as needed for up to 5 days for moderate pain, severe pain or shortness of breath.       Allergies  Allergen Reactions  . Pantoprazole Diarrhea  . Protonix [Pantoprazole Sodium] Diarrhea    Consultations:  Palliative Care   Procedures/Studies: Dg Chest 1 View  Result Date: 07/18/2019 CLINICAL DATA:  PICC placement. EXAM: CHEST  1 VIEW COMPARISON:  Radiograph of June 07, 2019 FINDINGS: Stable cardiomediastinal silhouette. Interval placement of left-sided PICC line with distal tip in expected position of the SVC. No pneumothorax is noted. Large left lower  lobe opacity is noted concerning for edema. Minimal right upper lobe subsegmental atelectasis is noted. No acute bony abnormality is noted. IMPRESSION: Interval placement of left-sided PICC line with distal tip in expected position of the SVC. Large left lower lobe opacity is now noted concerning for pneumonia. Electronically Signed   By: Marijo Conception M.D.   On: 07/18/2019 10:20      Subjective: No acute events ON per staff.  Discharge Exam: Vitals:   07/21/19 1000 07/21/19 1005  BP:    Pulse:    Resp:    Temp:    SpO2: 100% 95%   Vitals:   07/20/19 2012 07/21/19 0551 07/21/19 1000 07/21/19 1005  BP:  131/73 121/81    Pulse: (!) 121 92    Resp: (!) 22 18    Temp: 97.6 F (36.4 C) (!) 97.4 F (36.3 C)    TempSrc: Oral Oral    SpO2: 91% 100% 100% 95%  Weight:      Height:        General exam:85 y.o.maleAppears calm but tired and somewhat confused. Respiratory system:Decreased at bases. left rhonchi noted Cardiovascular system:S1 &S2 heard, NAT.5/5 holosystolic murmur,No JVD, rubs, gallops or clicks. No pedal edema. Gastrointestinal system:Abdomen is nondistended, soft and nontender. No organomegaly or masses felt. Normal bowel sounds heard. Central nervous system:somnolent    The results of significant diagnostics from this hospitalization (including imaging, microbiology, ancillary and laboratory) are listed below for reference.     Microbiology: Recent Results (from the past 240 hour(s))  SARS Coronavirus 2 (CEPHEID- Performed in [place] hospital lab), Hosp Order     Status: None   Collection Time: 07/18/19 12:56 AM   Specimen: Nasopharyngeal Swab  Result Value Ref Range Status   SARS Coronavirus 2 NEGATIVE NEGATIVE Final    Comment: (NOTE) If result is NEGATIVE SARS-CoV-2 target nucleic acids are NOT DETECTED. The SARS-CoV-2 RNA is generally detectable in upper and lower  respiratory specimens during the acute phase of infection. The lowest  concentration of SARS-CoV-2 viral copies this assay can detect is 250  copies / mL. A negative result does not preclude SARS-CoV-2 infection  and should not be used as the sole basis for treatment or other  patient management decisions.  A negative result may occur with  improper specimen collection / handling, submission of specimen other  than nasopharyngeal swab, presence of viral mutation(s) within the  areas targeted by this assay, and inadequate number of viral copies  (<250 copies / mL). A negative result must be combined with clinical  observations, patient history, and epidemiological information. If result  is POSITIVE SARS-CoV-2 target nucleic acids are DETECTED. The SARS-CoV-2 RNA is generally detectable in upper and lower  respiratory specimens dur ing the acute phase of infection.  Positive  results are indicative of active infection with SARS-CoV-2.  Clinical  correlation with patient history and other diagnostic information is  necessary to determine patient infection status.  Positive results do  not rule out bacterial infection or co-infection with other viruses. If result is PRESUMPTIVE POSTIVE SARS-CoV-2 nucleic acids MAY BE PRESENT.   A presumptive positive result was obtained on the submitted specimen  and confirmed on repeat testing.  While 2019 novel coronavirus  (SARS-CoV-2) nucleic acids may be present in the submitted sample  additional confirmatory testing may be necessary for epidemiological  and / or clinical management purposes  to differentiate between  SARS-CoV-2 and other Sarbecovirus currently known to infect humans.  If clinically  indicated additional testing with an alternate test  methodology [phone]) is advised. The SARS-CoV-2 RNA is generally  detectable in upper and lower respiratory sp ecimens during the acute  phase of infection. The expected result is Negative. Fact Sheet for Patients:  StrictlyIdeas.no Fact Sheet for Healthcare Providers: BankingDealers.co.za This test is not yet approved or cleared by the [place] FDA and has been authorized for detection and/or diagnosis of SARS-CoV-2 by FDA under an Emergency Use Authorization (EUA).  This EUA will remain in effect (meaning this test can be used) for the duration of the COVID-19 declaration under Section 564(b)(1) of the Act, 21 U.S.C. section 360bbb-3(b)(1), unless the authorization is terminated or revoked sooner. Performed at [facility], [place] [address] [place], [place] [address]   Culture, blood (routine x 2)     Status:  None (Preliminary result)   Collection Time: 07/18/19 12:57 AM   Specimen: BLOOD LEFT HAND  Result Value Ref Range Status   Specimen Description   Final    BLOOD LEFT HAND Performed at [facility], [place] [address] [place], [place] [address]    Special Requests   Final    BOTTLES DRAWN AEROBIC ONLY Blood Culture adequate volume Performed at [place] [address], [place], [place] [address]    Culture   Final    NO GROWTH 3 DAYS Performed at [facility], [place] [address], [place], [place] [address]    Report Status PENDING  Incomplete  MRSA PCR Screening     Status: None   Collection Time: 07/18/19 12:57 AM   Specimen: Urine, Catheterized; Nasopharyngeal  Result Value Ref Range Status   MRSA by PCR NEGATIVE NEGATIVE Final    Comment:        The GeneXpert MRSA Assay (FDA approved for NASAL specimens only), is one component of a comprehensive MRSA colonization surveillance program. It is not intended to diagnose MRSA infection nor to guide or monitor treatment for MRSA infections. Performed at [facility], [place] [address], [place], [place] [address]   Culture, blood (routine x 2)     Status: None (Preliminary result)   Collection Time: 07/18/19  1:02 AM   Specimen: BLOOD RIGHT HAND  Result Value Ref Range Status   Specimen Description   Final    BLOOD RIGHT HAND Performed at [facility], [place] [address] [place], [place] [address]    Special Requests   Final    BOTTLES DRAWN AEROBIC ONLY Blood Culture adequate volume Performed at [place] [address], [place], [place] [address]    Culture   Final    NO GROWTH 3 DAYS Performed at [facility], [place] [address], [place],  [address]    Report Status PENDING  Incomplete     Labs: BNP (last 3 results) Recent Labs    03/18/19 0307 06/08/19 0842  BNP 46.8 962.2*   Basic Metabolic Panel: Recent Labs  Lab  07/18/19 0056 07/18/19 0434 07/19/19 0709 07/20/19 1133  NA 139 138 143 144  K 3.3* 3.2* 2.8* 2.7*  CL 97* 99 107 112*  CO2 29 27 26 26   GLUCOSE 203* 194* 96 203*  BUN 31* 31* 25* 28*  CREATININE 0.81 0.78 0.60* 0.62  CALCIUM 9.2 9.0 9.3 9.5  MG  --   --  2.1  --   PHOS  --   --  1.6* 2.0*   Liver Function  Tests: Recent Labs  Lab 07/18/19 0056 07/20/19 1133  AST 36  --   ALT 19  --   ALKPHOS 56  --   BILITOT 0.6  --   PROT 6.0*  --   ALBUMIN 2.0* 1.7*   No results for input(s): LIPASE, AMYLASE in the last 168 hours. No results for input(s): AMMONIA in the last 168 hours. CBC: Recent Labs  Lab 07/18/19 0056 07/18/19 0434 07/19/19 0709 07/20/19 1133  WBC 5.0 4.6 3.8* 2.5*  NEUTROABS 3.7  --   --   --   HGB 7.6* 9.1* 7.9* 7.7*  HCT 23.2* 28.8* 25.1* 24.1*  MCV 105.5* 105.5* 105.9* 105.7*  PLT 189 174 159 138*   Cardiac Enzymes: No results for input(s): CKTOTAL, CKMB, CKMBINDEX, TROPONINI in the last 168 hours. BNP: Invalid input(s): POCBNP CBG: Recent Labs  Lab 07/19/19 1130 07/19/19 1620 07/19/19 2231 07/20/19 0756 07/20/19 1205  GLUCAP 152* 185* 188* 187* 191*   D-Dimer No results for input(s): DDIMER in the last 72 hours. Hgb A1c No results for input(s): HGBA1C in the last 72 hours. Lipid Profile No results for input(s): CHOL, HDL, LDLCALC, TRIG, CHOLHDL, LDLDIRECT in the last 72 hours. Thyroid function studies No results for input(s): TSH, T4TOTAL, T3FREE, THYROIDAB in the last 72 hours.  Invalid input(s): FREET3 Anemia work up No results for input(s): VITAMINB12, FOLATE, FERRITIN, TIBC, IRON, RETICCTPCT in the last 72 hours. Urinalysis    Component Value Date/Time   COLORURINE YELLOW 03/17/2019 2026   APPEARANCEUR CLEAR 03/17/2019 2026   LABSPEC 1.015 03/17/2019 2026   PHURINE 5.0 03/17/2019 2026   GLUCOSEU 150 (A) 03/17/2019 2026   HGBUR SMALL (A) 03/17/2019 2026   BILIRUBINUR NEGATIVE 03/17/2019 2026   KETONESUR NEGATIVE 03/17/2019  2026   PROTEINUR NEGATIVE 03/17/2019 2026   UROBILINOGEN 1.0 11/24/2014 1506   NITRITE NEGATIVE 03/17/2019 2026   LEUKOCYTESUR NEGATIVE 03/17/2019 2026   Sepsis Labs Invalid input(s): PROCALCITONIN,  WBC,  LACTICIDVEN Microbiology Recent Results (from the past 240 hour(s))  SARS Coronavirus 2 (CEPHEID- Performed in [place] hospital lab), Hosp Order     Status: None   Collection Time: 07/18/19 12:56 AM   Specimen: Nasopharyngeal Swab  Result Value Ref Range Status   SARS Coronavirus 2 NEGATIVE NEGATIVE Final    Comment: (NOTE) If result is NEGATIVE SARS-CoV-2 target nucleic acids are NOT DETECTED. The SARS-CoV-2 RNA is generally detectable in upper and lower  respiratory specimens during the acute phase of infection. The lowest  concentration of SARS-CoV-2 viral copies this assay can detect is 250  copies / mL. A negative result does not preclude SARS-CoV-2 infection  and should not be used as the sole basis for treatment or other  patient management decisions.  A negative result may occur with  improper specimen collection / handling, submission of specimen other  than nasopharyngeal swab, presence of viral mutation(s) within the  areas targeted by this assay, and inadequate number of viral copies  (<250 copies / mL). A negative result must be combined with clinical  observations, patient history, and epidemiological information. If result is POSITIVE SARS-CoV-2 target nucleic acids are DETECTED. The SARS-CoV-2 RNA is generally detectable in upper and lower  respiratory specimens dur ing the acute phase of infection.  Positive  results are indicative of active infection with SARS-CoV-2.  Clinical  correlation with patient history and other diagnostic information is  necessary to determine patient infection status.  Positive results do  not rule out bacterial infection or co-infection  with other viruses. If result is PRESUMPTIVE POSTIVE SARS-CoV-2 nucleic acids MAY BE  PRESENT.   A presumptive positive result was obtained on the submitted specimen  and confirmed on repeat testing.  While 2019 novel coronavirus  (SARS-CoV-2) nucleic acids may be present in the submitted sample  additional confirmatory testing may be necessary for epidemiological  and / or clinical management purposes  to differentiate between  SARS-CoV-2 and other Sarbecovirus currently known to infect humans.  If clinically indicated additional testing with an alternate test  methodology [phone]) is advised. The SARS-CoV-2 RNA is generally  detectable in upper and lower respiratory sp ecimens during the acute  phase of infection. The expected result is Negative. Fact Sheet for Patients:  StrictlyIdeas.no Fact Sheet for Healthcare Providers: BankingDealers.co.za This test is not yet approved or cleared by the [place] FDA and has been authorized for detection and/or diagnosis of SARS-CoV-2 by FDA under an Emergency Use Authorization (EUA).  This EUA will remain in effect (meaning this test can be used) for the duration of the COVID-19 declaration under Section 564(b)(1) of the Act, 21 U.S.C. section 360bbb-3(b)(1), unless the authorization is terminated or revoked sooner. Performed at [facility], [place] [address] [place], [place] [address]   Culture, blood (routine x 2)     Status: None (Preliminary result)   Collection Time: 07/18/19 12:57 AM   Specimen: BLOOD LEFT HAND  Result Value Ref Range Status   Specimen Description   Final    BLOOD LEFT HAND Performed at [facility], [place] [address], [place], [place] [address]    Special Requests   Final    BOTTLES DRAWN AEROBIC ONLY Blood Culture adequate volume Performed at [place] [address], [place], [place] [address]    Culture   Final    NO GROWTH 3 DAYS Performed at [facility], [place] [address], [place], [place]  [address]    Report Status PENDING  Incomplete  MRSA PCR Screening     Status: None   Collection Time: 07/18/19 12:57 AM   Specimen: Urine, Catheterized; Nasopharyngeal  Result Value Ref Range Status   MRSA by PCR NEGATIVE NEGATIVE Final    Comment:        The GeneXpert MRSA Assay (FDA approved for NASAL specimens only), is one component of a comprehensive MRSA colonization surveillance program. It is not intended to diagnose MRSA infection nor to guide or monitor treatment for MRSA infections. Performed at [facility], [place] [address] [place], [place] [address]   Culture, blood (routine x 2)     Status: None (Preliminary result)   Collection Time: 07/18/19  1:02 AM   Specimen: BLOOD RIGHT HAND  Result Value Ref Range Status   Specimen Description   Final    BLOOD RIGHT HAND Performed at [facility], [place] [address], [place], [place] [address]    Special Requests   Final    BOTTLES DRAWN AEROBIC ONLY Blood Culture adequate volume Performed at [place] [address], [place], [place] [address]    Culture   Final    NO GROWTH 3 DAYS Performed at [facility], [place] [address] [place],  [address]    Report Status PENDING  Incomplete     Time coordinating discharge: 35 minutes  SIGNED:   Jonnie Finner, DO  [facility] 07/21/2019, 11:09 AM Pager   If 7PM-7AM, please contact night-coverage [email or web address]  Password [email or web address]

## 2019-07-21 NOTE — Progress Notes (Signed)
Pt took his meds mixed in water/apple juice mixture thickened to a consistency of pudding.  Swallowed without coughing.

## 2019-07-21 NOTE — Progress Notes (Signed)
Manufacturing engineer [facility])  Family has decided they want to take Mr. Mccants home to pass there.    Spoke with family and confirmed.    Only DME request is O2.  Will order from [place], should be delivered tonight so pt can be transferred home in the am.  Please leave foley in, you may remove PICC line.  Discharging MD: please send scripts for comfort medications if needed   Updated TOC team.  Thank you, Venia Carbon RN, BSN, [facility] Liaison (in [place]) [phone]

## 2019-07-21 NOTE — TOC Transition Note (Signed)
Transition of Care [facility]) - CM/SW Discharge Note   Patient Details  Name: Chad Fuller MRN: [id number] Date of Birth: [birth year]  Transition of [facility]) CM/SW Contact:  Lia Hopping, [place] Phone Number: 07/21/2019, 12:15 PM   Clinical Narrative:    Patient will [facility] to [facility].  PTAR to transport.  DNR/ Med Scripts signed. D/C summary faxed to: [phone]   Final next level of care: [place] Barriers to Discharge: Continued Medical Work up   Patient Goals and CMS Choice Patient states their goals for this hospitalization and ongoing recovery are:: did not participate pt minimally responsive   Choice offered to / list presented to : NA  Discharge Placement              Patient chooses bed at: [facility]) Patient to be transferred to facility by: PTAR   Patient and family notified of of transfer: 07/21/19  Discharge Plan and Services In-house Referral: Clinical Social Work Discharge Planning Services: CM Consult                                 Social Determinants of Health ([place]) Interventions     Readmission Risk Interventions Readmission Risk Prevention Plan 07/19/2019 03/20/2019  Transportation Screening Complete Complete  PCP or Specialist Appt within 5-7 Days - Complete  PCP or Specialist Appt within 3-5 Days Not Complete -  Not Complete comments N/A pt is resident of SNF -  Home Care Screening - Complete  Medication Review (RN CM) - Complete  HRI or Home Care Consult Not Complete -  [place] or Home Care Consult comments pt is resident of SNF -  Social Work Consult for [place] Planning/Counseling Complete -  Palliative Care Screening Complete -  Medication Review Press photographer) Complete -  Some recent data might be hidden

## 2019-07-21 NOTE — Care Management Important Message (Signed)
Important Message  Patient Details IM Letter given to Kathrin Greathouse RN to present to the Patient Name: Chad Fuller MRN: [id number] Date of Birth: 07-Sep-1926   Medicare Important Message Given:  Yes     Kerin Salen 07/21/2019, 10:29 AM

## 2019-07-21 NOTE — Progress Notes (Signed)
Manufacturing engineer [facility])  [facility] will have a bed for Mr. Lubitz today.  Updated daughter and son.  Consents need to be completed prior to transfer, and [name] (daughter) was in transit to see her son in another hospital.  Will update LCSW once forms are complete so transport can be arranged.  RN Staff:  Please leave PICC line in as well as indwelling foley.  You may call report at any time to [phone], room assignment will be determined at that time.  Once complete, please fa d/c summary to [phone]  Thank you, Venia Carbon RN, BSN, [facility] (in [place]) [phone]

## 2019-07-22 NOTE — Progress Notes (Signed)
Per chart pt's family desires pt to return to home to:  [place] [address]  Pt's Hospice RN will be able to be at the home at 3pm and that pt will be going Home with Hospice instead of residential hospice and that the pt can come to the home ASAP. Pt has dementia.  Per family pt's bed is being set up now, per family.  RN updated.  CSW will continue to follow for D/C needs.  Alphonse Guild. Merion Grimaldo, LCSW, LCAS, CSI Transitions of Care Clinical Social Worker Care Coordination Department Ph: [phone]

## 2019-07-22 NOTE — TOC Transition Note (Signed)
Transition of Care [facility]) - CM/SW Discharge Note   Patient Details  Name: Chad Fuller MRN: [id number] Date of Birth: 21-Aug-1926  Transition of Care [facility]) CM/SW Contact:  Claudine Mouton, LCSW Phone Number: 07/22/2019, 11:44 AM   Clinical Narrative:  Per chart pt's family desires pt to return to home to:  [place] [address]  Pt's Hospice RN will be able to be at the home at 3pm and that pt will be going Home with Hospice instead of residential hospice and that the pt can come to the home ASAP. Pt has dementia.  Per family pt's bed is being set up now, per family.  RN updated.   Final next level of care: Home w Hospice Care Barriers to Discharge: No Barriers Identified   Patient Goals and CMS Choice Patient states their goals for this hospitalization and ongoing recovery are:: Per family, home for comfort care.   Choice offered to / list presented to : NA  Discharge Placement              Patient chooses bed at: [facility]) Patient to be transferred to facility by: [place] Name of family member notified: Aquilla Solian) Patient and family notified of of transfer: (Pt has dementia)  Discharge Plan and Services In-house Referral: Clinical Social Work Discharge Planning Services: CM Consult                                 Social Determinants of Health ([place]) Interventions     Readmission Risk Interventions Readmission Risk Prevention Plan 07/19/2019 03/20/2019  Transportation Screening Complete Complete  PCP or Specialist Appt within 5-7 Days - Complete  PCP or Specialist Appt within 3-5 Days Not Complete -  Not Complete comments N/A pt is resident of SNF -  Home Care Screening - Complete  Medication Review (RN CM) - Complete  HRI or Home Care Consult Not Complete -  [place] or Home Care Consult comments pt is resident of SNF -  Social Work Consult for [place] Planning/Counseling Complete -  Palliative Care Screening  Complete -  Medication Review Press photographer) Complete -  Some recent data might be hidden

## 2019-07-22 NOTE — Progress Notes (Signed)
Manufacturing engineer [facility]) Hospital Liaison note.   DME delivery has been confirmed and assessment visit with [facility] admission nurse is scheduled for 3PM today.   Thank you,  Farrel Gordon, RN, [place] (in [place]) [phone]

## 2019-07-23 LAB — CULTURE, BLOOD (ROUTINE X 2)
Culture: NO GROWTH
Culture: NO GROWTH
Special Requests: ADEQUATE
Special Requests: ADEQUATE

## 2019-07-29 DEATH — deceased

## 2019-08-02 ENCOUNTER — Ambulatory Visit: Payer: Medicare Other | Admitting: Family

## 2019-08-03 ENCOUNTER — Ambulatory Visit: Payer: Medicare Other | Admitting: Orthopedic Surgery

## 2019-09-25 ENCOUNTER — Telehealth: Payer: Self-pay | Admitting: *Deleted

## 2019-09-25 ENCOUNTER — Telehealth: Payer: Medicare Other | Admitting: Adult Health

## 2019-09-25 NOTE — Telephone Encounter (Signed)
I called daughter of pt, wanda.  She stated pt passed away in 2019-08-21.  Appt cancelled.  Sent message to JM/NP and AA in billing.

## 2019-09-25 NOTE — Progress Notes (Deleted)
[facility] [address] [place]. [place] [address] [phone]       VIRTUAL VISIT FOLLOW UP NOTE  Chad Fuller Date of Birth:  30-Apr-1926 Medical Record Number:  [id number]   Reason for Referral:  hospital stroke follow up    Virtual Visit via Video Note  I connected with Chad Fuller on 09/25/19 at 11:15 AM EDT by a video enabled telemedicine application located at [facility] and verified that I am speaking with the correct person using two identifiers who was located at [facility] assisted by facility nurse.   Visit scheduled by Katharine Look, RN. She discussed the limitations of evaluation and management by telemedicine and the availability of in person appointments. The patient expressed understanding and agreed to proceed.Please see telephone note for additional scheduling information and consent.    CHIEF COMPLAINT:  No chief complaint on file.   HPI: Chad Fuller was initially scheduled today for in office hospital follow-up regarding bilateral embolic patchy multifocal ischemic infarcts secondary to undetermined source on 03/17/2019 but due to COVID-19 safety precautions, visit transition to telemedicine via [email or web address] with patient and facilities consent. History obtained from facility nurse and chart review. Reviewed all radiology images and labs personally.   Chad. Chad Fuller is a 85 y.o. male with history of diabetes, HTN, HLD, prior history of stroke, coronary artery disease, lung cancer, and PE  who presented after a transient episode of left-sided weakness, chronic right facial droop - possibly worse, recent fall and difficulty speaking.Marland Kitchen He did not receive IV t-PA due to late presentation.  Stroke work-up revealed bilateral patchy multifocal ischemic infarcts embolic pattern secondary to unknown source with residual generalized weakness.  Loop recorder placed to assess for potential atrial fibrillation as stroke etiology.   LDL 61 and A1c 9.7.  Previously on aspirin 81 mg and recommended increasing dosage to aspirin 325 mg daily.  Prior history of stroke in 2015.  HTN stable.  Continuation of Zetia 10 mg daily for HLD management.  Recommended close PCP follow-up for uncontrolled DM.  Other stroke risk factors include advanced age, former tobacco use and CAD.  He was discharged to [facility] SNF in stable condition.  He has been stable from a stroke standpoint with residual generalized weakness but did gain initial improvement after hospital discharge and was transferred to [facility] assisted living facility on 04/12/2019.  He unfortunately started to deteriorate with having difficulty ambulating and requiring more assistance.  Lab work on 05/12/2019 showed Hgb 5.9 and was sent to [facility] ER for further evaluation.  He was found to have a GI bleed which required transfusions and discontinuation of aspirin.  He has not had follow-up with GI at this time.  Recent lab work showed Hgb 9.5.  He was also found to have acute urinary retention/bladder outlet obstruction requiring Foley catheter which is still currently in place.  Facility plans on removing catheter on 06/27/2019 for voiding trial and will follow with urology as scheduled.  Treated for left heel ulcer with osteomyelitis and continues to follow with orthopedics outpatient.  He was discharged back to [facility] rehab in [place] as he was unable to return to [facility] and [facility] due to [place].  He continues to receive therapies but is nonambulatory and needs assistance of sit to stand for transfers.  He is able to feed himself but otherwise is dependent in all care.  Facility is unsure at this time if he will be  able to return to assisted living versus return to long-term care.  Mental status wax/wane but overall stable.  Loop recorder has not shown atrial fibrillation thus far.  No further concerns.     Stroke: Bilateral patchy multifocal ischemic infarcts  - embolic - source unknown.  Resultant generalized weakness  CT head - No CT evidence for acute intracranial abnormality.   MRI head - Patchy multifocal ischemic infarcts involving the bilateral cerebral hemispheres, most prominent of which measures 18 mm involving the right basal ganglia. Multiple remote infarcts.  MRA head and neck- focal moderate stenosis within the left M1 and mid left V2 segment  2D Echo EF > 65%  Loop recorder placed  LDL - 61  Chad Fuller - 9.7  VTE prophylaxis - SCDs  aspirin 81 mg daily prior to admission, now on aspirin 325 mg daily  Patient will be counseled to be compliant with his antithrombotic medications  Ongoing aggressive stroke risk factor management  Therapy recommendations: SNF  Disposition:   SNF - [facility]   ROS:   14 system review of systems performed and negative with exception of weakness, ambulation difficulty, memory loss, confusion  PMH:  Past Medical History:  Diagnosis Date   Bell's palsy 05/2013   LEFT FACE   CAD (coronary artery disease)    Cancer ([place])    lung   DDD (degenerative disc disease), lumbar    SPINE   Dementia ([place])    Diabetes mellitus    Diverticulosis    GERD (gastroesophageal reflux disease)    History of blood clots    Hypercholesterolemia    Hypertension    Hyponatremia    FROM HYDROCHLOROTHIAZIDE   Lumbar foraminal stenosis    RIGHT L3   Moderate aortic stenosis 05/2018   Moderate COPD (chronic obstructive pulmonary disease) ([place])    PULMONARY FUNCTION TESTS MAY 2009   Prostate enlargement    Pulmonary embolus [facility]) july 2005   Squamous cell lung cancer ([place]) 08/2004   CT CHEST 07/2002 SCARRING   Stroke [facility])    Stroke ([place]) 10/2014   LEFT PARACENTRAL PONS RIGHT SIDED WEAKNESS, ATAXIA AND DYSPHAGIA NO RESIDUAL    PSH:  Past Surgical History:  Procedure Laterality Date   COLONOSCOPY N/A 07/24/2015   Procedure: COLONOSCOPY;  Surgeon: Laurence Spates, MD;  Location: [facility]  ENDOSCOPY;  Service: Endoscopy;  Laterality: N/A;   CORONARY STENT PLACEMENT     ESOPHAGOGASTRODUODENOSCOPY N/A 07/24/2015   Procedure: ESOPHAGOGASTRODUODENOSCOPY (EGD);  Surgeon: Laurence Spates, MD;  Location: [facility] ENDOSCOPY;  Service: Endoscopy;  Laterality: N/A;  bleeding source not in colon; EGD performed to locate source   FILTERING PROCEDURE     reports filter placed after knee surgery for blood clots   HERNIA REPAIR Bilateral    I&D EXTREMITY  08/16/2012   Procedure: MINOR IRRIGATION AND DEBRIDEMENT EXTREMITY;  Surgeon: Tennis Must, MD;  Location: [place];  Service: Orthopedics;  Laterality: Right;   KNEE SURGERY     LOOP RECORDER INSERTION N/A 03/20/2019   Procedure: LOOP RECORDER INSERTION;  Surgeon: Evans Lance, MD;  Location: [facility] CV [facility];  Service: Cardiovascular;  Laterality: N/A;   LUMBAR LAMINECTOMY  1979   LUNG REMOVAL, PARTIAL     TURP [place]   DR. [facility]   VENA CAVA FILTER PLACEMENT  06/2004   FOR PULMONARY EMBOLUS   WEDGE RESECTION  09/2004   non-small cell lung cancer Dr. Arlyce Dice    Social History:  Social History  Socioeconomic History   Marital status: Widowed    Spouse name: Not on file   Number of children: Not on file   Years of education: Not on file   Highest education level: Not on file  Occupational History   Not on file  Social Needs   Financial resource strain: Not on file   Food insecurity    Worry: Not on file    Inability: Not on file   Transportation needs    Medical: Not on file    Non-medical: Not on file  Tobacco Use   Smoking status: Former Smoker    Types: Cigarettes    Quit date: 07/28/1974    Years since quitting: 45.1   Smokeless tobacco: Never Used  Substance and Sexual Activity   Alcohol use: No   Drug use: No   Sexual activity: Not on file  Lifestyle   Physical activity    Days per week: Not on file    Minutes per session: Not on file   Stress: Not on  file  Relationships   Social connections    Talks on phone: Not on file    Gets together: Not on file    Attends religious service: Not on file    Active member of club or organization: Not on file    Attends meetings of clubs or organizations: Not on file    Relationship status: Not on file   Intimate partner violence    Fear of current or ex partner: Not on file    Emotionally abused: Not on file    Physically abused: Not on file    Forced sexual activity: Not on file  Other Topics Concern   Not on file  Social History Narrative   Not on file    Family History:  Family History  Problem Relation Age of Onset   Diabetes Mellitus II Sister    Breast cancer Sister    Heart attack Mother    CAD Mother    Throat cancer Father    Alcoholism Brother    Cirrhosis Brother    Diabetes Brother    CAD Sister    Breast cancer Sister     Medications:   No current outpatient medications on file prior to visit.   No current facility-administered medications on file prior to visit.     Allergies:   Allergies  Allergen Reactions   Pantoprazole Diarrhea   Protonix [Pantoprazole Sodium] Diarrhea     Physical Exam  General: well developed, well nourished, pleasant elderly Caucasian male, seated, in no evident distress Head: head normocephalic and atraumatic.    Neurologic Exam Mental Status: Awake and fully alert. Disoriented to time but oriented to place and self. Recent and remote memory diminished. Attention span, concentration and fund of knowledge diminished. Mood and affect appropriate and cooperative with exam.  Cranial Nerves: Extraocular movements full without nystagmus. Hearing diminished bilaterally. Facial sensation intact.  Right lower facial paralysis (chronic).  Shoulder shrug symmetric. Motor: Generalized weakness per drift assessment Sensory.: intact to light touch Coordination: Rapid alternating movements slow in upper extremities.  Finger-to-nose performed accurately and heel-to-shin difficulty performing. Gait and Station: Gait assessment deferred as he is nonambulatory Reflexes: UTA   NIHSS  6 Modified Rankin  4-5   Diagnostic Data (Labs, Imaging, Testing)  Ct Head Wo Contrast 03/17/2019 IMPRESSION:  1. No CT evidence for acute intracranial abnormality.  2. Atrophy and mild small vessel ischemic changes of the white matter   Chad Fuller  Head Wo Contrast Chad Fuller Neck W Wo Contrast Chad Brain Wo Contrast 03/18/2019 IMPRESSION:  MRI HEAD  IMPRESSION:  1. Patchy multifocal ischemic infarcts involving the bilateral cerebral hemispheres as above, most prominent of which measures 18 mm involving the right basal ganglia. No associated hemorrhage or mass effect. A central thromboembolic etiology felt to be likely given the various vascular distributions involved.  2. Underlying atrophy with mild chronic microvascular ischemic disease, with a few scatter remote infarcts involving the bilateral basal ganglia, pons, and bilateral cerebellar hemispheres.   MRA HEAD  IMPRESSION:  1. Negative intracranial MRA for large vessel occlusion.  2. Single short-segment moderate to severe distal left M1 stenosis. No other hemodynamically significant or correctable stenosis identified. 3. 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   MRA NECK  IMPRESSION:  1. Wide patency of the common and internal carotid arteries bilaterally.  2. Single focal moderate stenosis within the mid left V2 segment. Left vertebral arteries otherwise widely patent within the neck. Right vertebral artery dominant.    Transthoracic Echocardiogram  1. The left ventricle has hyperdynamic systolic function, with an ejection fraction of >65%. The cavity size was normal. Left ventricular diastolic Doppler parameters are consistent with impaired relaxation. 2. The right ventricle has normal systolc function. The cavity was normal. There is  no increase in right ventricular wall thickness. 3. The mitral valve is degenerative. Mild thickening of the mitral valve leaflet. There is moderate mitral annular calcification present. Mild mitral valve stenosis. 4. Mean transmitral gradient is 18mmHg. 5. Aortic valve regurgitation was not assessed by color flow Doppler. 6. Pulmonic valve regurgitation was not assessed by color flow Doppler. 7. No intracardiac thrombi or masses were visualized.   EKG - SR rate 98 BPM. (See cardiology reading for complete details)   ASSESSMENT: AXYL SITZMAN is a 85 y.o. year old male here with bilateral embolic patchy multifocal ischemic infarcts on 03/17/2019 secondary to undetermined source.  Loop recorder placed to rule out atrial fibrillation.  Vascular risk factors include HTN, HLD, DM, CAD, prior stroke and prior tobacco use.  Residual deficit of generalized weakness initially improved but was complicated by GI bleed leading to anemia    PLAN:  1. Cryptogenic stroke: Continue Zetia 10 mg for secondary stroke prevention.  Continue to monitor loop recorder for atrial fibrillation.  Will need follow-up with GI for clearance of restarting aspirin for secondary stroke prevention.  Continue to monitor loop recorder for atrial fibrillation.  Maintain strict control of hypertension with blood pressure goal below 130/90, diabetes with hemoglobin A1c goal below 6.5% and cholesterol with LDL cholesterol (bad cholesterol) goal below 70 mg/dL.  I also advised the patient to eat a healthy diet with plenty of whole grains, cereals, fruits and vegetables, exercise regularly with at least 30 minutes of continuous activity daily and maintain ideal body weight.  Continue participation in physical therapy for ongoing strengthening of lower extremities 2. HTN: Advised to continue current treatment regimen. Advised to continue to monitor at home along with continued follow-up with PCP for management 3. HLD: Advised to  continue current treatment regimen along with continued follow-up with PCP for future prescribing and monitoring of lipid panel 4. DMII: Advised to continue to monitor glucose levels at home along with continued follow-up with PCP for management and monitoring 5. Recent GI bleed: Referral to GI for recent GI bleed and possibly restarting aspirin for secondary stroke prevention 6. Cognitive impairment: Has been stable without worsening   Follow up  in 3 months or call earlier if needed   Greater than 50% of time during this 30 minute non-face-to-face visit was spent on counseling, explanation of diagnosis of cryptogenic stroke, reviewing risk factor management of HTN, HLD, DM, CAD, planning of further management along with potential future management, and discussion with patient and family answering all questions.    Venancio Poisson, AGNP-BC  [facility] [address] [place], [place] [address]  Phone [phone] Fax [phone] Note: This document was prepared with digital dictation and possible smart phrase technology. Any transcriptional errors that result from this process are unintentional.

## 2020-11-25 IMAGING — DX LEFT FOOT - COMPLETE 3+ VIEW
3 series · 3 of 3 positions shown · non-contrast
Comparison: Radiographs March 17, 2019.

CLINICAL DATA: Bilateral heel ulcers.

EXAM:
LEFT FOOT - COMPLETE 3+ VIEW

[foot ap]
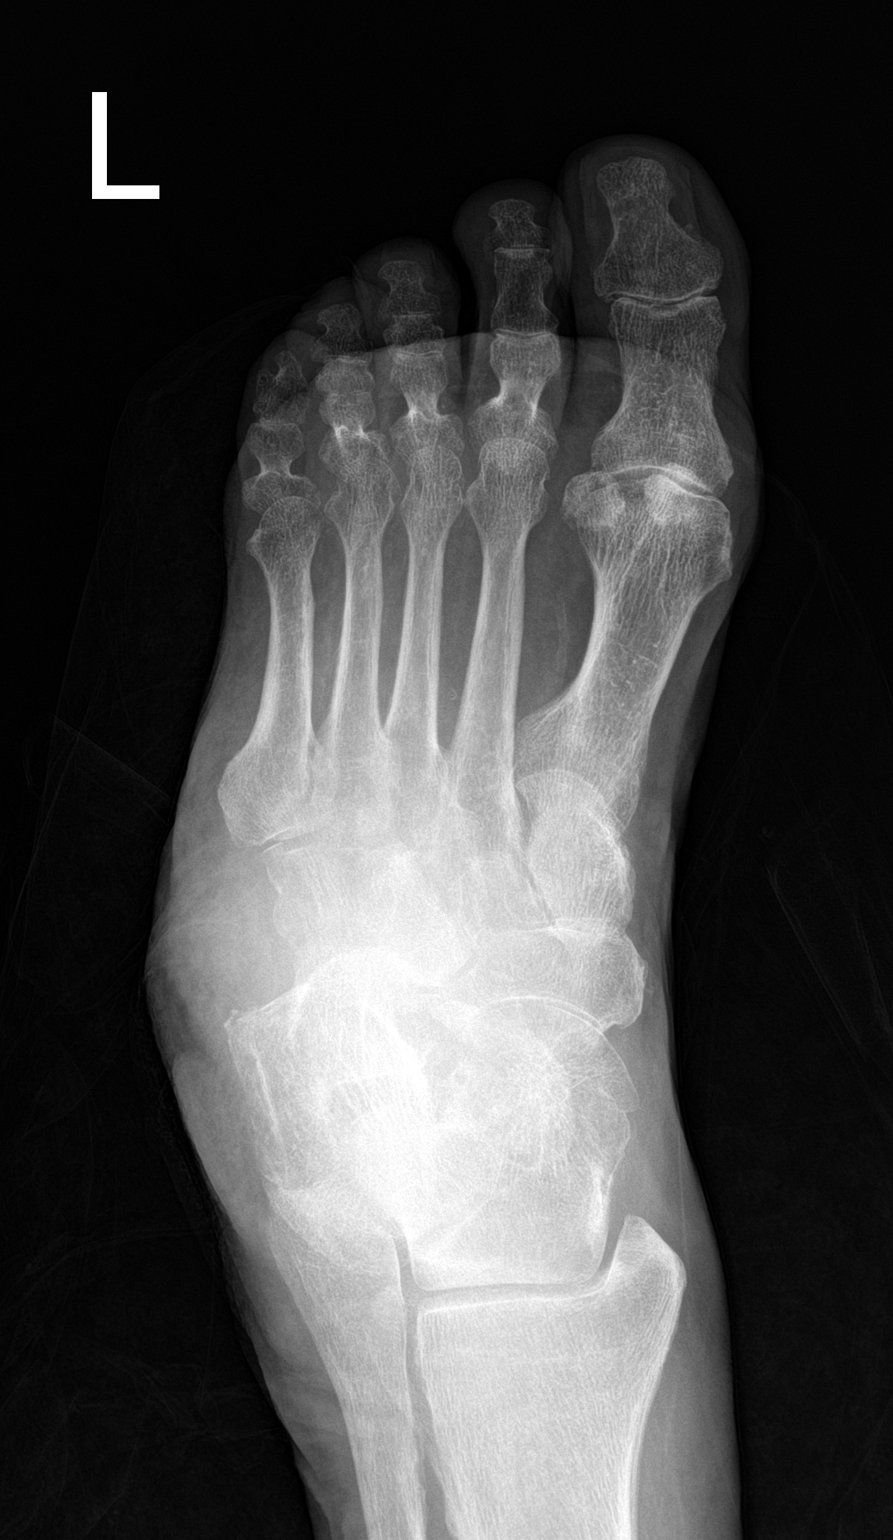

[foot obl]
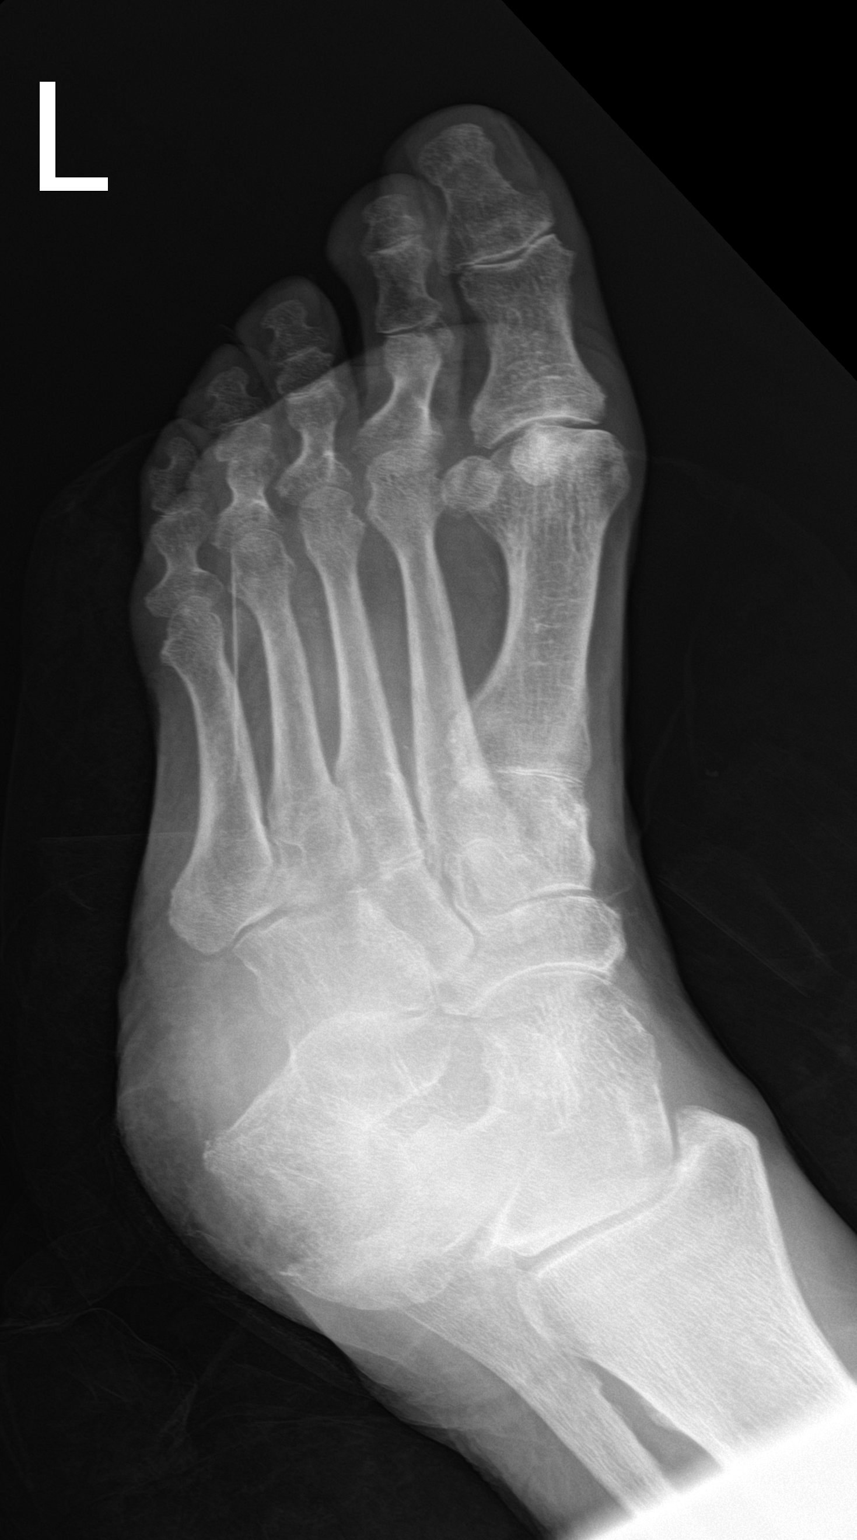

[foot lat]
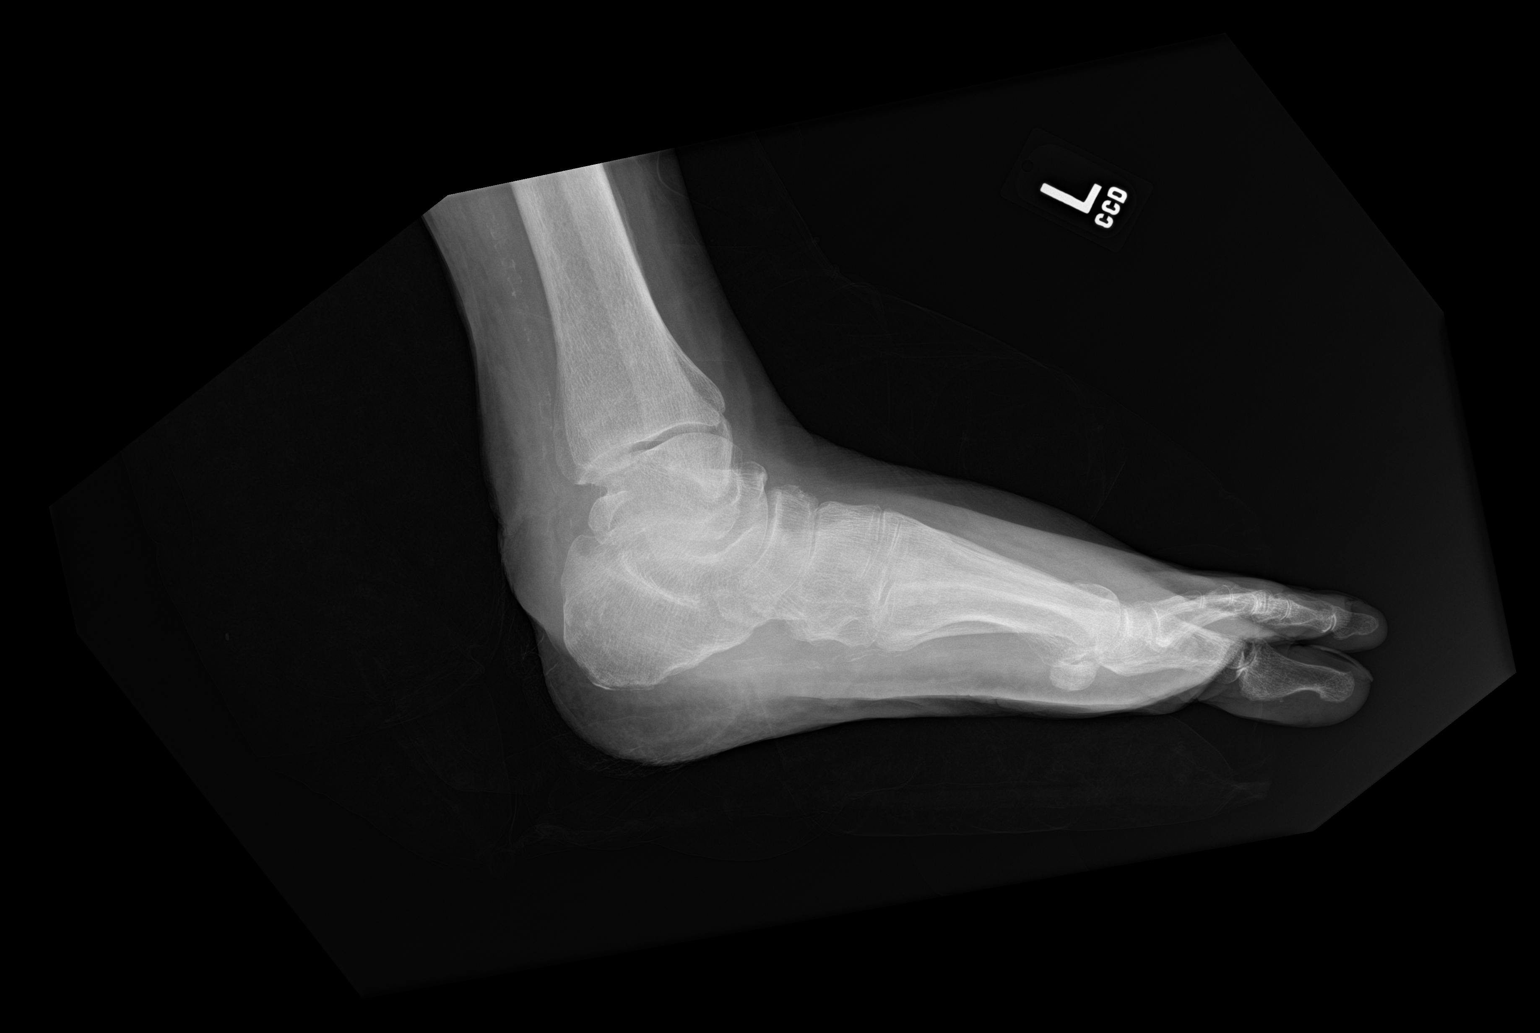

[3 of 3 positions shown; findings below may reference images not displayed]

FINDINGS: Probable lytic area is seen involving the posterior calcaneus most
likely due to osteomyelitis. No acute fracture or dislocation is
noted. Probable overlying soft tissue ulceration is noted.
Degenerative changes are seen involving the metatarsophalangeal
joints.
IMPRESSION: Probable lytic areas seen involving posterior calcaneus most
consistent with osteomyelitis. MRI is recommended for further
evaluation.

## 2020-11-25 IMAGING — CT CT ABDOMEN AND PELVIS WITHOUT CONTRAST
2 of 4 series · 15 of 46 positions shown, 17 images · non-contrast
Comparison: Abdominal ultrasound 06/15/2011

CLINICAL DATA: Anemia.  Non pulsatile abdominal mass.

EXAM:
CT ABDOMEN AND PELVIS WITHOUT CONTRAST
TECHNIQUE: Multidetector CT imaging of the abdomen and pelvis was performed
following the standard protocol without IV contrast.

[Series 2: axial st · axial · 0.97mm/px · z∈[+1033,+1483]mm · 12 of 102 slices shown, 14 images]
[im 6/102  soft-tissue]
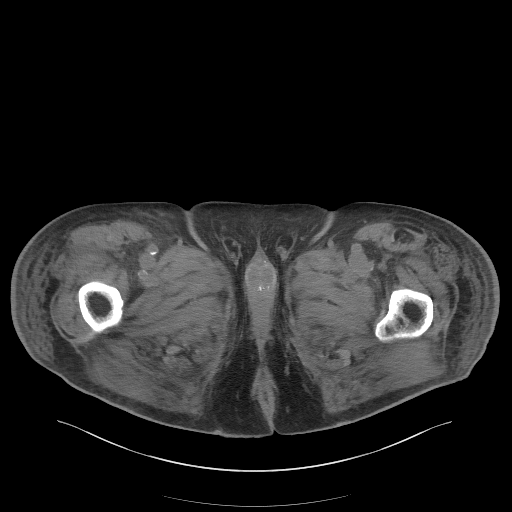
[im 6/102  bone]
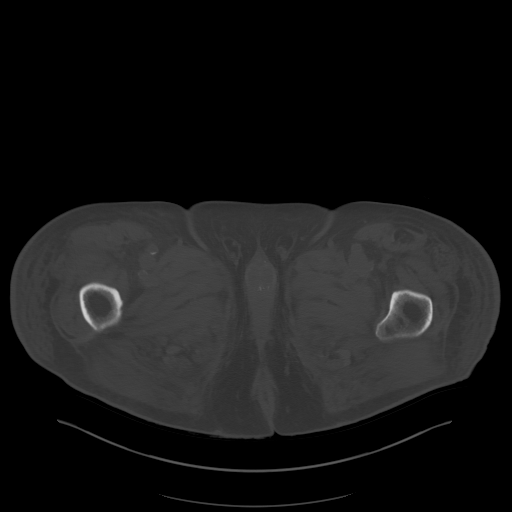
[im 18/102  soft-tissue]
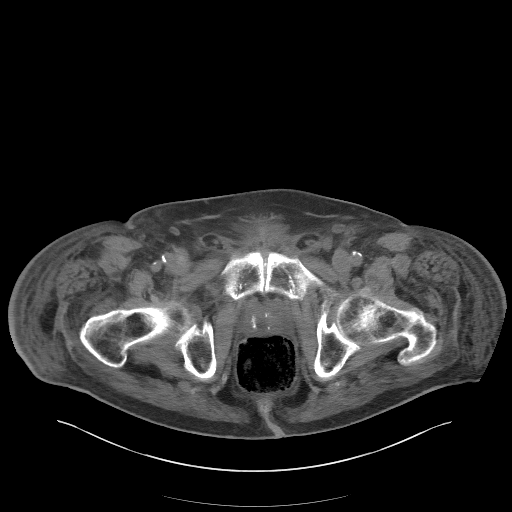
[im 24/102  soft-tissue]
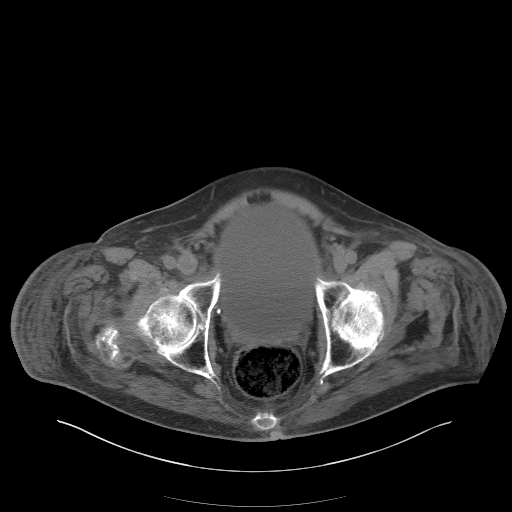
[im 30/102  soft-tissue]
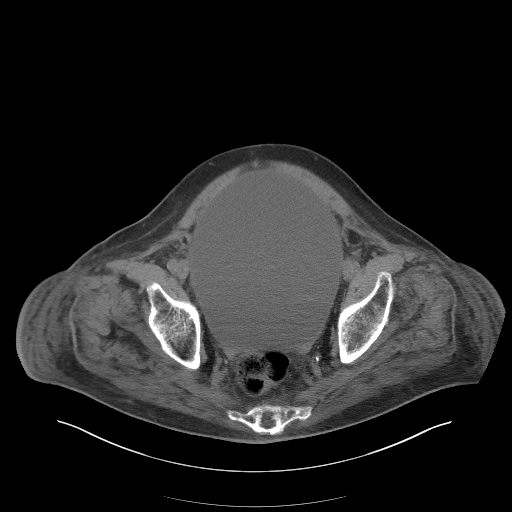
[im 42/102  soft-tissue]
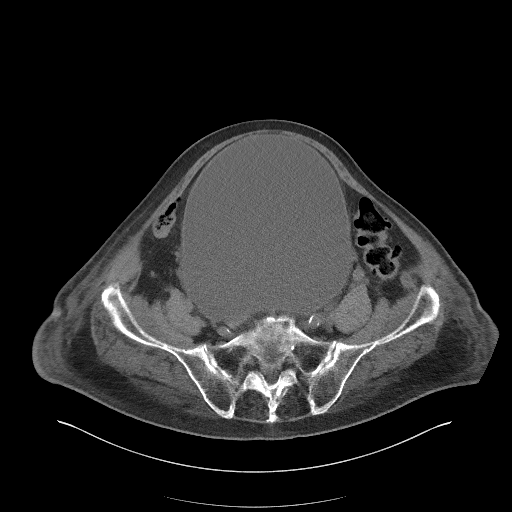
[im 48/102  soft-tissue]
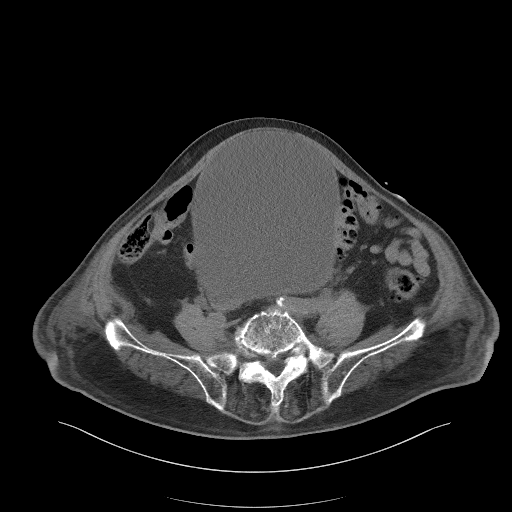
[im 54/102  soft-tissue]
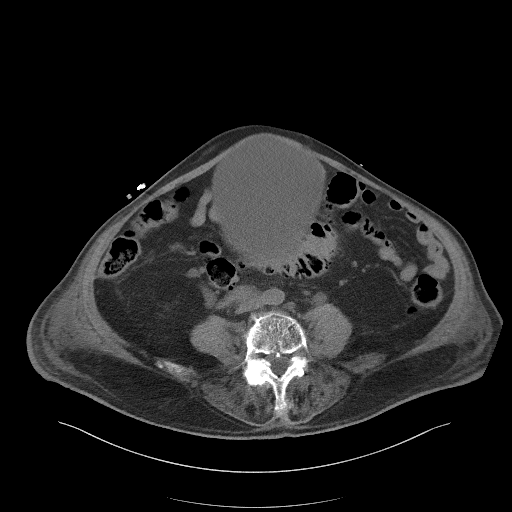
[im 66/102  soft-tissue]
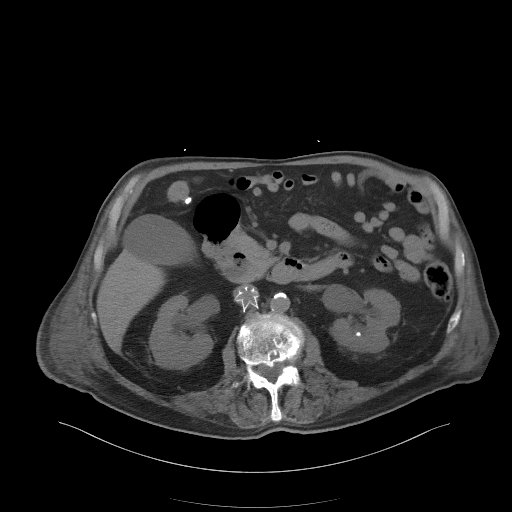
[im 72/102  soft-tissue]
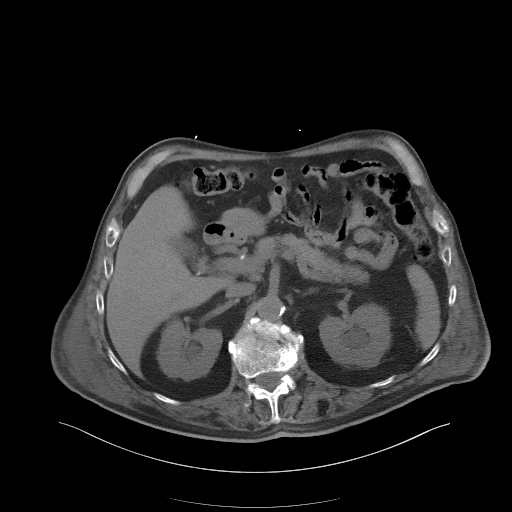
[im 72/102  bone]
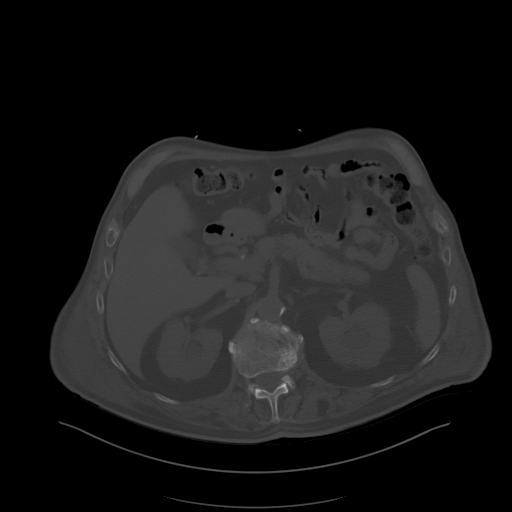
[im 78/102  soft-tissue]
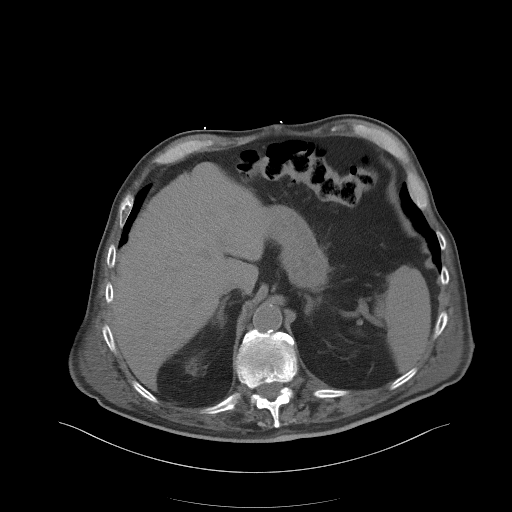
[im 90/102  soft-tissue]
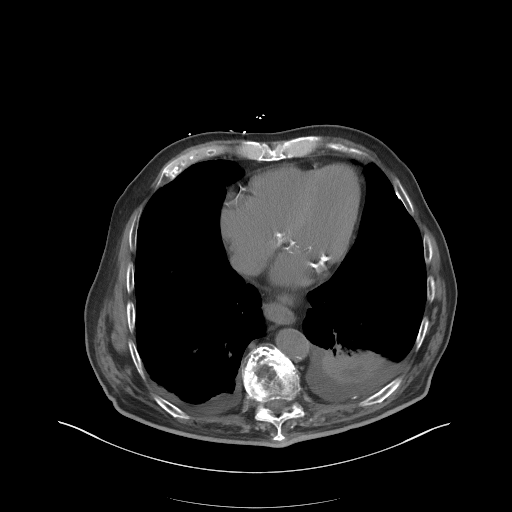
[im 96/102  soft-tissue]
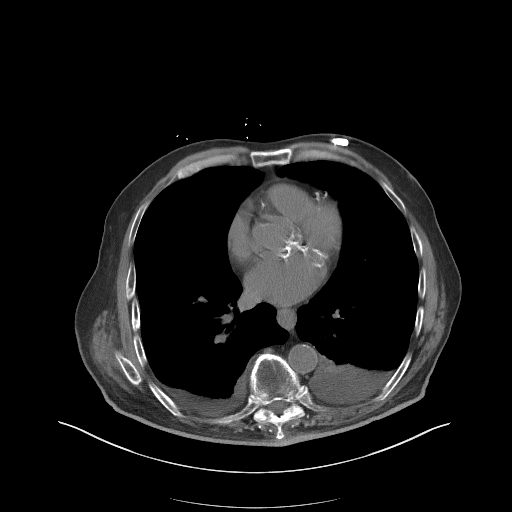

[Series 4: coronal st · coronal · 0.99mm/px · 3 of 104 slices shown]
[im 35/104  soft-tissue]
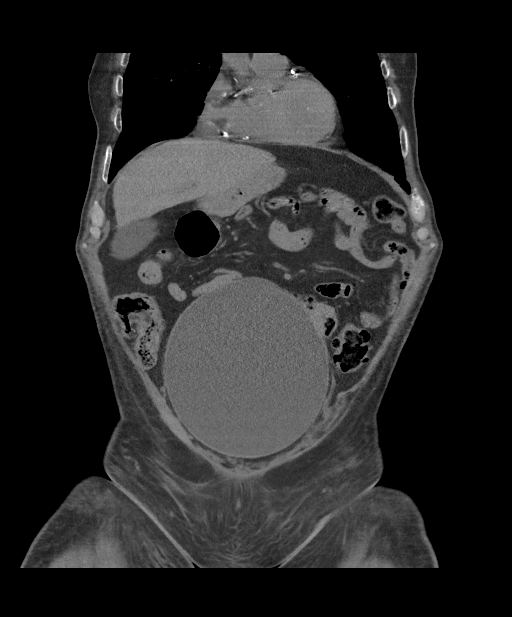
[im 46/104  soft-tissue]
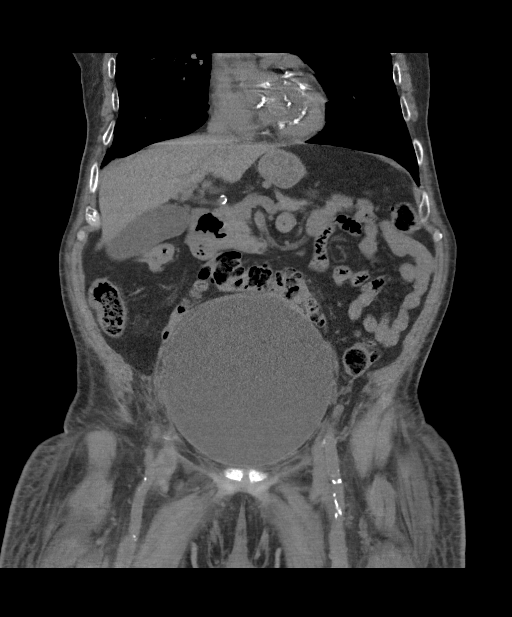
[im 58/104  soft-tissue]
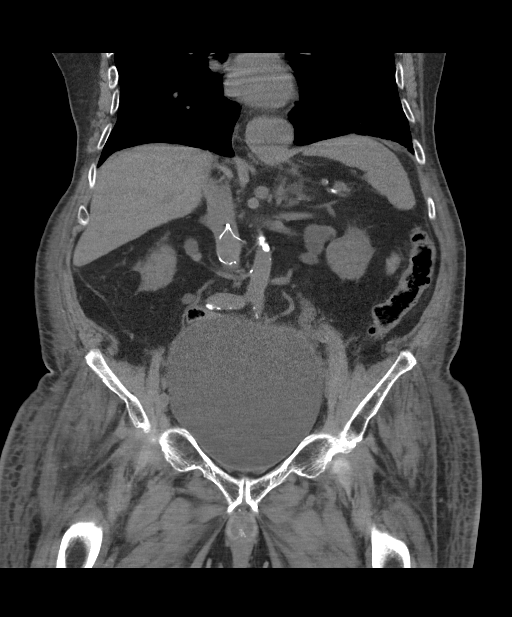

[15 of 46 positions shown; findings below may reference images not displayed]

FINDINGS: Lower chest: Moderate bilateral pleural effusions are present.
Associated airspace disease likely reflects atelectasis. The heart
size is normal. Extensive dense coronary artery calcifications are
present. Aortic valve and mitral annular calcifications are present.

Hepatobiliary: Liver is unremarkable. Small stones are present at
the gallbladder neck. The gallbladder is mildly distended without
inflammatory change. The common bile duct is within normal limits
into the pancreatic head.

Pancreas: Unremarkable. No pancreatic ductal dilatation or
surrounding inflammatory changes.

Spleen: Normal in size without focal abnormality.

Adrenals/Urinary Tract: Adrenal glands are normal bilaterally.
Moderate bilateral hydronephrosis is present. A nonobstructing 7 mm
stone is present in the midportion of the left kidney. No other
stone or mass lesion is present. Ureters are dilated bilaterally
into a markedly dilated urinary bladder. Urinary bladder measures
24.8 x 15 x 7 x 16.9 cm. This distends the lower abdomen and is
likely the palpable mass. A TURP defect is present in the prostate.

Stomach/Bowel: A moderate-sized hiatal hernia is present. The
stomach and duodenum are otherwise within normal limits. Small bowel
is unremarkable. Terminal ileum is within normal limits. Appendix is
visualized and normal. The ascending and transverse colon are within
normal limits. Diverticular changes are present within the
descending and sigmoid colon. No inflammatory changes are evident.

Vascular/Lymphatic: Atherosclerotic changes are noted in the aorta
without aneurysm. IVC filter is in place. Calcifications extend into
the arterial branches. No significant adenopathy is present.

Reproductive: TURP defect is noted in the prostate. Prostate gland
is grossly normal in size. No obstructing mass is evident.

Other: No abdominal wall hernia or abnormality. No abdominopelvic
ascites. Bilateral soft tissue edema is present laterally in the
lower abdomen and pelvis. There is some edema in the flanks
bilaterally.

Musculoskeletal: A remote inferior L1 compression fracture is noted.
There is no retropulsed bone. Mild rightward curvature is present at
L1-2 marked endplate changes are present at L3-4, L4-5 and L5-S1.
There is osseous foraminal narrowing bilaterally at these levels. No
focal lytic or blastic lesions are present. Degenerative changes are
noted in the SI joints. Bony pelvis is otherwise normal. Hips are
located and within normal limits.
IMPRESSION: 1. Marked distention of the urinary bladder. This represents the
palpable mass in the lower abdomen. The bladder measures up to
cm in long axis.
2. TURP defect in the prostate gland. There is presumably a more
distal urinary outlet obstruction. No discrete mass is visualized.
3. Dilated ureters with bilateral moderate hydronephrosis.
4. Nonobstructing 7 mm stone in the midportion of the left kidney.
5.  Aortic Atherosclerosis ([id number]-R03.3).  No aneurysm is present
6. Extensive coronary artery disease with dense calcifications.
7. Dense calcifications associated with the aortic and mitral
valves.
8. Bilateral pleural effusions and associated atelectasis.
9. Multilevel degenerative changes in the lower lumbar spine.
